# Patient Record
Sex: Male | Born: 1945 | Race: White | Hispanic: No | Smoking: Former smoker
Health system: Southern US, Community
[De-identification: ages and names within clinical notes are randomized; demographics above are authoritative.]

## PROBLEM LIST (undated history)

## (undated) DIAGNOSIS — H9192 Unspecified hearing loss, left ear: Secondary | ICD-10-CM

## (undated) DIAGNOSIS — I10 Essential (primary) hypertension: Secondary | ICD-10-CM

## (undated) DIAGNOSIS — I4891 Unspecified atrial fibrillation: Secondary | ICD-10-CM

## (undated) HISTORY — DX: Essential (primary) hypertension: I10

## (undated) HISTORY — DX: Unspecified atrial fibrillation: I48.91

## (undated) HISTORY — DX: Unspecified hearing loss, left ear: H91.92

---
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
## 1973-08-01 HISTORY — PX: BACK SURGERY: SHX140

## 1983-08-02 HISTORY — PX: BACK SURGERY: SHX140

## 2020-09-06 DIAGNOSIS — I25810 Atherosclerosis of coronary artery bypass graft(s) without angina pectoris: Secondary | ICD-10-CM | POA: Diagnosis present

## 2020-09-06 DIAGNOSIS — I5032 Chronic diastolic (congestive) heart failure: Secondary | ICD-10-CM | POA: Diagnosis present

## 2020-09-06 DIAGNOSIS — I25118 Atherosclerotic heart disease of native coronary artery with other forms of angina pectoris: Secondary | ICD-10-CM

## 2020-09-06 DIAGNOSIS — I2510 Atherosclerotic heart disease of native coronary artery without angina pectoris: Secondary | ICD-10-CM

## 2020-09-06 DIAGNOSIS — F418 Other specified anxiety disorders: Secondary | ICD-10-CM | POA: Diagnosis present

## 2020-09-06 DIAGNOSIS — E782 Mixed hyperlipidemia: Secondary | ICD-10-CM | POA: Insufficient documentation

## 2020-09-15 DIAGNOSIS — G4733 Obstructive sleep apnea (adult) (pediatric): Secondary | ICD-10-CM | POA: Insufficient documentation

## 2021-04-01 DIAGNOSIS — R001 Bradycardia, unspecified: Secondary | ICD-10-CM

## 2021-04-01 HISTORY — DX: Bradycardia, unspecified: R00.1

## 2021-04-12 DIAGNOSIS — I480 Paroxysmal atrial fibrillation: Secondary | ICD-10-CM | POA: Insufficient documentation

## 2021-04-12 DIAGNOSIS — I4891 Unspecified atrial fibrillation: Secondary | ICD-10-CM | POA: Insufficient documentation

## 2021-10-20 ENCOUNTER — Other Ambulatory Visit: Payer: Medicare Other

## 2021-10-20 ENCOUNTER — Other Ambulatory Visit: Payer: Self-pay

## 2021-10-20 ENCOUNTER — Ambulatory Visit (INDEPENDENT_AMBULATORY_CARE_PROVIDER_SITE_OTHER): Payer: Medicare Other | Admitting: Family

## 2021-10-20 ENCOUNTER — Encounter: Payer: Self-pay | Admitting: Family

## 2021-10-20 VITALS — BP 130/88 | HR 65 | Temp 97.5°F | Resp 18 | Ht 66.5 in | Wt 211.0 lb

## 2021-10-20 DIAGNOSIS — Z23 Encounter for immunization: Secondary | ICD-10-CM

## 2021-10-20 DIAGNOSIS — L602 Onychogryphosis: Secondary | ICD-10-CM

## 2021-10-20 DIAGNOSIS — I480 Paroxysmal atrial fibrillation: Secondary | ICD-10-CM

## 2021-10-20 DIAGNOSIS — I129 Hypertensive chronic kidney disease with stage 1 through stage 4 chronic kidney disease, or unspecified chronic kidney disease: Secondary | ICD-10-CM | POA: Insufficient documentation

## 2021-10-20 DIAGNOSIS — Z1211 Encounter for screening for malignant neoplasm of colon: Secondary | ICD-10-CM

## 2021-10-20 DIAGNOSIS — E785 Hyperlipidemia, unspecified: Secondary | ICD-10-CM | POA: Diagnosis not present

## 2021-10-20 DIAGNOSIS — G4733 Obstructive sleep apnea (adult) (pediatric): Secondary | ICD-10-CM

## 2021-10-20 DIAGNOSIS — N183 Chronic kidney disease, stage 3 unspecified: Secondary | ICD-10-CM

## 2021-10-20 DIAGNOSIS — N1832 Chronic kidney disease, stage 3b: Secondary | ICD-10-CM | POA: Insufficient documentation

## 2021-10-20 DIAGNOSIS — Z1159 Encounter for screening for other viral diseases: Secondary | ICD-10-CM

## 2021-10-20 DIAGNOSIS — Z7689 Persons encountering health services in other specified circumstances: Secondary | ICD-10-CM

## 2021-10-20 DIAGNOSIS — E1142 Type 2 diabetes mellitus with diabetic polyneuropathy: Secondary | ICD-10-CM | POA: Diagnosis not present

## 2021-10-20 MED ORDER — TETANUS-DIPHTH-ACELL PERTUSSIS 5-2.5-18.5 LF-MCG/0.5 IM SUSP: 0.5000 mL | Freq: Once | INTRAMUSCULAR | 0 refills | Status: AC

## 2021-10-20 NOTE — Progress Notes (Signed)
? ?Provider: Richarda Bladeinah Dominiqua Cooner FNP-C  ? ?No primary care provider on file. ? ?No care team member to display ? ?Extended Emergency Contact Information ?Primary Emergency Contact: Posceo,Deborah ?Address: [address] ?         [place] [id number] [place] ?Home Phone: [phone] ?Mobile Phone: [phone] ?Relation: Sister ?Preferred language: English ?Secondary Emergency Contact: Upham,Pattie ?Mobile Phone: [phone] ?Relation: Other ? ?Code Status:  Full code  ?Goals of care: Advanced Directive information ? ?  10/20/2021  ? 10:28 AM  ?Advanced Directives  ?Does Patient Have a Medical Advance Directive? Yes  ?Type of Estate agentAdvance Directive Healthcare Power of HeckerAttorney;Living will;Out of facility DNR (pink MOST or yellow form)  ?Does patient want to make changes to medical advance directive? No - Patient declined  ?Copy of Healthcare Power of Attorney in Chart? No - copy requested  ? ? ? ?Chief Complaint  ?Patient presents with  ? Establish Care  ?  New Patient.   ? ? ?HPI:  ?Pt is a 76 y.o. male seen today establish care here at [facility] for medical management of chronic diseases.  Has a medical history of CAD status post CABG/PCI, A-fib, non- rheumatic aortic valve stenosis, chronic diastolic congestive heart failure, type 2 diabetes with neuropathy, hypertension ,hyperlipidemia,GERD, obstructive sleep apnea, anxiety with depression seasonal,allergies ? ?Aortic valve stenosis -moderate by more most recent echo.  Was following up with valve team: ? ?Diastolic CHF- symptoms controlled on Furosemide 40 mg tablet daily   ? ?Hypertension - sometimes blood pressure runs high and at time low.once in a while feels dizzy.denies any headache,vision changes,fatigue,chest tightness,palpitation,chest pain or shortness of breath.    ? ? 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 ? ?CKD 3 a -latest  creatinine 1.52  ? ?Type 2 diabetes with neuropathy -His home CBG ranges in the 80's -120's   Latest A1c was 5.9 ( 09/27/2021).  Currently off medication.  States metformin was discontinued.  He is due for annual foot exam with podiatrist.  He does report numbness and tingling on peripheral extremities.  Also due for urine microalbumin. ? ?Hyperlipidemia -total cholesterol 264, triglycerides 318, and LDL 142 not on any statin. ? ?CAD status post CABG and PCI -follows up with cardiology.  Denies chest pains.Has Nitro PRN  ? ?GERD - on Protonix.symptoms controlled.No blood or dark stool  ? ?Anxiety and depression -on Effexor mood controlled.No thoughts of suicide or Injury to self  ? ?Seasonal allergies - on Flonase .Not controlled.takes OTC cough drops. ? 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 ? ?Hx of rib fracture due to fall down the steps after the dog took off when he was holding the lease.  ? ?Health maintenance:  ?Due for tetanus and zoster vaccine.  States zoster vaccine level is too expensive so he did not get it. ? ?He is due for screening colonoscopy.  He reports no symptoms.  He prefers to do Cologuard. ? ? ? ?Past Medical History:  ?Diagnosis Date  ? Atrial fibrillation (HCC)   ? Deafness in left ear   ? Hypertension   ? ?Past Surgical History:  ?Procedure Laterality Date  ? BACK SURGERY  1975  ? Lower Back Surgery, Dr.Seltzer  ? BACK SURGERY  1985  ? Dr.Seltzer  ? SHOULDER SURGERY Left 1965  ? Dr.Seltzer  ? TONSILLECTOMY  1950  ? Dr.Perellie  ? ? ?  No Known Allergies ? ?Allergies as of 10/20/2021   ?No Known Allergies ?  ? ?  ?Medication List  ?  ? ?  ? Accurate as of October 20, 2021  9:05 PM. If you have any questions, ask your nurse or doctor.  ?  ?  ? ?  ? ?amiodarone 200 MG tablet ?Commonly known as: PACERONE ?Take 200 mg by mouth daily. ?  ?aspirin EC 81 MG tablet ?Take 81 mg by mouth daily. Swallow whole. ?  ?fluticasone 50 MCG/ACT nasal spray ?Commonly known as: FLONASE ?Place 2 sprays  into both nostrils 3 (three) times daily as needed for allergies or rhinitis. ?  ?furosemide 40 MG tablet ?Commonly known as: LASIX ?Take 40 mg by mouth daily at 12 noon. ?  ?montelukast 10 MG tablet ?Commonly known as: SINGULAIR ?Take 10 mg by mouth in the morning. ?  ?nitroGLYCERIN 0.4 MG SL tablet ?Commonly known as: NITROSTAT ?Place 0.4 mg under the tongue every 5 (five) minutes as needed for chest pain. ?  ?pantoprazole 40 MG tablet ?Commonly known as: PROTONIX ?Take 40 mg by mouth daily. ?  ?propranolol 10 MG tablet ?Commonly known as: INDERAL ?Take 10 mg by mouth in the morning. ?  ?Symbicort 160-4.5 MCG/ACT inhaler ?Generic drug: budesonide-formoterol ?Inhale 2 puffs into the lungs 2 (two) times daily. Morning & Bedtime. ?  ?Tdap 5-2.5-18.5 LF-MCG/0.5 injection ?Commonly known as: BOOSTRIX ?Inject 0.5 mLs into the muscle once for 1 dose. ?  ?venlafaxine XR 150 MG 24 hr capsule ?Commonly known as: EFFEXOR-XR ?Take 150 mg by mouth daily with breakfast. ?  ? ?  ? ? ?Review of Systems  ?Constitutional:  Negative for appetite change, chills, fatigue, fever and unexpected weight change.  ?HENT:  Positive for hearing loss and rhinorrhea. Negative for congestion, dental problem, ear discharge, ear pain, facial swelling, nosebleeds, postnasal drip, sinus pressure, sinus pain, sneezing, sore throat, tinnitus and trouble swallowing.   ?     Left ear deaf  ?Eyes:  Positive for visual disturbance. Negative for pain, discharge, redness and itching.  ?     Wears eye glasses  ?Respiratory:  Negative for cough, chest tightness and wheezing.   ?     Dyspnea with exertion  ?OSA   ?Cardiovascular:  Positive for palpitations. Negative for chest pain and leg swelling.  ?Gastrointestinal:  Negative for abdominal distention, abdominal pain, blood in stool, constipation, diarrhea, nausea and vomiting.  ?Endocrine: Negative for cold intolerance, heat intolerance, polydipsia, polyphagia and polyuria.  ?Genitourinary:  Negative for  difficulty urinating, dysuria, flank pain, frequency and urgency.  ?Musculoskeletal:  Negative for arthralgias, back pain, gait problem, joint swelling, myalgias, neck pain and neck stiffness.  ?Skin:  Negative for color change, pallor, rash and wound.  ?Neurological:  Positive for numbness. Negative for dizziness, syncope, speech difficulty, weakness, light-headedness and headaches.  ?     On fingers ,feet   ?Hematological:  Does not bruise/bleed easily.  ?Psychiatric/Behavioral:  Negative for agitation, behavioral problems, confusion, hallucinations, self-injury, sleep disturbance and suicidal ideas. The patient is not nervous/anxious.   ?     Depression and anxiety controlled   ? ?Immunization History  ?Administered Date(s) Administered  ? Fluad Quad(high Dose 65+) 05/06/2020  ? Influenza Split 09/15/2009, 05/03/2013  ? Influenza, High Dose Seasonal PF 06/05/2014, 04/20/2015, 06/08/2016, 04/22/2017, 04/15/2021  ? Influenza-Unspecified 07/14/2004, 05/07/2007, 05/02/2008, 05/01/2018, 04/29/2019  ? PPD Test 06/08/2021  ? Pneumococcal Conjugate-13 06/05/2014  ? Pneumococcal Polysaccharide-23 08/13/2012  ? ?Pertinent  Health Maintenance Due  ?  Topic Date Due  ? URINE MICROALBUMIN  Never done  ? COLONOSCOPY (Pts 45-70yrs Insurance coverage will need to be confirmed)  Never done  ? INFLUENZA VACCINE  Completed  ? ? ?  10/20/2021  ? 10:28 AM  ?Fall Risk  ?Falls in the past year? 0  ?Was there an injury with Fall? 0  ?Fall Risk Category Calculator 0  ?Fall Risk Category Low  ?Patient Fall Risk Level Low fall risk  ?Patient at Risk for Falls Due to No Fall Risks  ?Fall risk Follow up Falls evaluation completed  ? ?Functional Status Survey: ?  ? ?Vitals:  ? 10/20/21 1018  ?BP: 130/88  ?Pulse: 65  ?Resp: 18  ?Temp: (!) 97.5 ?F (36.4 ?C)  ?SpO2: 92%  ?Weight: 211 lb (95.7 kg)  ?Height: 5' 6.5" (1.689 m)  ? ?Body mass index is 33.55 kg/m?Marland Kitchen ?Physical Exam ?Vitals reviewed.  ?Constitutional:   ?   General: He is not in acute  distress. ?   Appearance: Normal appearance. He is obese. He is not ill-appearing or diaphoretic.  ?HENT:  ?   Head: Normocephalic.  ?   Comments: HOH left ear  ?   Right Ear: Tympanic membrane, ear canal and ext

## 2021-10-29 LAB — COLOGUARD

## 2021-11-09 ENCOUNTER — Encounter: Payer: Self-pay | Admitting: Family

## 2021-11-09 ENCOUNTER — Ambulatory Visit (INDEPENDENT_AMBULATORY_CARE_PROVIDER_SITE_OTHER): Payer: Medicare Other | Admitting: Family

## 2021-11-09 VITALS — BP 138/86 | HR 93 | Temp 97.6°F | Resp 18 | Ht 66.5 in | Wt 207.8 lb

## 2021-11-09 DIAGNOSIS — R21 Rash and other nonspecific skin eruption: Secondary | ICD-10-CM | POA: Diagnosis not present

## 2021-11-09 DIAGNOSIS — M62830 Muscle spasm of back: Secondary | ICD-10-CM

## 2021-11-09 MED ORDER — METHOCARBAMOL 500 MG PO TABS: 500.0000 mg | ORAL_TABLET | Freq: Three times a day (TID) | ORAL | 0 refills | Status: DC | PRN

## 2021-11-09 MED ORDER — TRIAMCINOLONE ACETONIDE 0.1 % EX CREA: 1.0000 "application " | TOPICAL_CREAM | Freq: Two times a day (BID) | CUTANEOUS | 0 refills | Status: AC

## 2021-11-09 NOTE — Progress Notes (Signed)
? ?Provider: Richarda Blade FNP-C ? ?Telissa Palmisano, Donalee Citrin, NP ? ?Patient Care Team: ?Chanya Chrisley, Donalee Citrin, NP as PCP - General (Family Medicine) ? ?Extended Emergency Contact Information ?Primary Emergency Contact: Posceo,Deborah ?Address: [address] ?         [place], [place] [address] [place] ?Home Phone: [phone] ?Mobile Phone: [phone] ?Relation: Sister ?Preferred language: English ?Secondary Emergency Contact: Upham,Pattie ?Mobile Phone: [phone] ?Relation: Other ? ?Code Status: Full code ?Goals of care: Advanced Directive information ? ?  11/09/2021  ?  2:46 PM  ?Advanced Directives  ?Does Patient Have a Medical Advance Directive? Yes  ?Type of Estate agent of [place];Living will;Out of facility DNR (pink MOST or yellow form)  ?Does patient want to make changes to medical advance directive? No - Patient declined  ?Copy of Healthcare Power of Attorney in Chart? No - copy requested  ? ? ? ?Chief Complaint  ?Patient presents with  ? Acute Visit  ?  Patient is requesting medication for muscle spasms X 4 days.   ? ? ?HPI:  ?Pt is a 76 y.o. male seen today for an acute visit for evaluation of muscle spasms x 4 days spasms located on the mid back describes as " Freezes " unable to turn certain way. No weakness,tingling or numbness,loss of bowel or bladder.  ?Also complains of rash at the back of the neck.unclear what caused the rash.? Possible bite.  ? ?Past Medical History:  ?Diagnosis Date  ? Atrial fibrillation (HCC)   ? Deafness in left ear   ? Hypertension   ? ?Past Surgical History:  ?Procedure Laterality Date  ? BACK SURGERY  1975  ? Lower Back Surgery, Dr.Seltzer  ? BACK SURGERY  1985  ? Dr.Seltzer  ? SHOULDER SURGERY Left 1965  ? Dr.Seltzer  ? TONSILLECTOMY  1950  ? Dr.Perellie  ? ? ?No Known Allergies ? ?Outpatient Encounter Medications as of 11/09/2021  ?Medication Sig  ? amiodarone (PACERONE) 200 MG tablet Take 200 mg by mouth daily.  ? aspirin EC 81 MG tablet  Take 81 mg by mouth daily. Swallow whole.  ? budesonide-formoterol (SYMBICORT) 160-4.5 MCG/ACT inhaler Inhale 2 puffs into the lungs 2 (two) times daily. Morning & Bedtime.  ? fluticasone (FLONASE) 50 MCG/ACT nasal spray Place 2 sprays into both nostrils 3 (three) times daily as needed for allergies or rhinitis.  ? furosemide (LASIX) 40 MG tablet Take 40 mg by mouth daily at 12 noon.  ? lisinopril (ZESTRIL) 2.5 MG tablet Take 2.5 mg by mouth daily.  ? montelukast (SINGULAIR) 10 MG tablet Take 10 mg by mouth in the morning.  ? nitroGLYCERIN (NITROSTAT) 0.4 MG SL tablet Place 0.4 mg under the tongue every 5 (five) minutes as needed for chest pain.  ? pantoprazole (PROTONIX) 40 MG tablet Take 40 mg by mouth daily.  ? propranolol (INDERAL) 10 MG tablet Take 10 mg by mouth in the morning.  ? rosuvastatin (CRESTOR) 40 MG tablet Take 40 mg by mouth daily.  ? venlafaxine XR (EFFEXOR-XR) 150 MG 24 hr capsule Take 150 mg by mouth daily with breakfast.  ? ?No facility-administered encounter medications on file as of 11/09/2021.  ? ? ?Review of Systems  ?Constitutional:  Negative for appetite change, chills, fatigue, fever and unexpected weight change.  ?Eyes:  Negative for pain, discharge, redness, itching and visual disturbance.  ?Respiratory:  Negative for cough, chest tightness, shortness of breath and wheezing.   ?Cardiovascular:  Negative for chest pain, palpitations and leg swelling.  ?  Gastrointestinal:  Negative for abdominal distention, abdominal pain, constipation, nausea and vomiting.  ?Genitourinary:  Negative for difficulty urinating, dysuria, flank pain, frequency and urgency.  ?Musculoskeletal:  Negative for arthralgias, back pain, gait problem, joint swelling, myalgias, neck pain and neck stiffness.  ?Skin:  Positive for rash. Negative for color change, pallor and wound.  ?     Rash back of the neck   ?Neurological:  Negative for dizziness, syncope, speech difficulty, weakness, light-headedness, numbness and  headaches.  ? ?Immunization History  ?Administered Date(s) Administered  ? Fluad Quad(high Dose 65+) 05/06/2020  ? Influenza Split 09/15/2009, 05/03/2013  ? Influenza, High Dose Seasonal PF 06/05/2014, 04/20/2015, 06/08/2016, 04/22/2017, 04/15/2021  ? Influenza-Unspecified 07/14/2004, 05/07/2007, 05/02/2008, 05/01/2018, 04/29/2019  ? PPD Test 06/08/2021  ? Pneumococcal Conjugate-13 06/05/2014  ? Pneumococcal Polysaccharide-23 08/13/2012  ? ?Pertinent  Health Maintenance Due  ?Topic Date Due  ? HEMOGLOBIN A1C  Never done  ? FOOT EXAM  Never done  ? OPHTHALMOLOGY EXAM  Never done  ? URINE MICROALBUMIN  Never done  ? COLONOSCOPY (Pts 45-7969yrs Insurance coverage will need to be confirmed)  Never done  ? INFLUENZA VACCINE  03/01/2022  ? ? ?  10/20/2021  ? 10:28 AM 11/09/2021  ?  2:46 PM  ?Fall Risk  ?Falls in the past year? 0 0  ?Was there an injury with Fall? 0 0  ?Fall Risk Category Calculator 0 0  ?Fall Risk Category Low Low  ?Patient Fall Risk Level Low fall risk Low fall risk  ?Patient at Risk for Falls Due to No Fall Risks No Fall Risks  ?Fall risk Follow up Falls evaluation completed Falls evaluation completed  ? ?Functional Status Survey: ?  ? ?Vitals:  ? 11/09/21 1455  ?BP: 138/86  ?Pulse: 93  ?Resp: 18  ?Temp: 97.6 ?F (36.4 ?C)  ?SpO2: 93%  ?Weight: 207 lb 12.8 oz (94.3 kg)  ?Height: 5' 6.5" (1.689 m)  ? ?Body mass index is 33.04 kg/m?Marland Kitchen. ?Physical Exam ?Vitals reviewed.  ?Constitutional:   ?   General: He is not in acute distress. ?   Appearance: Normal appearance. He is normal weight. He is not ill-appearing or diaphoretic.  ?HENT:  ?   Head: Normocephalic.  ?   Nose: Nose normal. No congestion or rhinorrhea.  ?   Mouth/Throat:  ?   Mouth: Mucous membranes are moist.  ?   Pharynx: Oropharynx is clear. No oropharyngeal exudate or posterior oropharyngeal erythema.  ?Eyes:  ?   General: No scleral icterus.    ?   Right eye: No discharge.     ?   Left eye: No discharge.  ?   Extraocular Movements: Extraocular  movements intact.  ?   Conjunctiva/sclera: Conjunctivae normal.  ?   Pupils: Pupils are equal, round, and reactive to light.  ?Neck:  ?   Vascular: No carotid bruit.  ?Cardiovascular:  ?   Rate and Rhythm: Normal rate and regular rhythm.  ?   Pulses: Normal pulses.  ?   Heart sounds: Normal heart sounds. No murmur heard. ?  No friction rub. No gallop.  ?Pulmonary:  ?   Effort: Pulmonary effort is normal. No respiratory distress.  ?   Breath sounds: Normal breath sounds. No wheezing, rhonchi or rales.  ?Chest:  ?   Chest wall: No tenderness.  ?Abdominal:  ?   General: Bowel sounds are normal. There is no distension.  ?   Palpations: Abdomen is soft. There is no mass.  ?   Tenderness:  There is no abdominal tenderness. There is no right CVA tenderness, left CVA tenderness, guarding or rebound.  ?Musculoskeletal:     ?   General: No swelling or tenderness. Normal range of motion.  ?   Cervical back: Normal range of motion. No rigidity or tenderness.  ?   Right lower leg: No edema.  ?   Left lower leg: No edema.  ?   Comments: Limited range of motion rotating from left to right patient guarding due to spasm.  ?Lymphadenopathy:  ?   Cervical: No cervical adenopathy.  ?Skin: ?   General: Skin is warm and dry.  ?   Coloration: Skin is not pale.  ?   Findings: Rash present. No bruising, erythema or lesion.  ?   Comments: Rash on back of the neck red difficult to discern due to scratching  ?Neurological:  ?   Mental Status: He is alert and oriented to person, place, and time.  ?   Motor: No weakness.  ?   Gait: Gait normal.  ?Psychiatric:     ?   Mood and Affect: Mood normal.     ?   Speech: Speech normal.     ?   Behavior: Behavior normal.  ? ? ?Labs reviewed: ?No results for input(s): NA, K, CL, CO2, GLUCOSE, BUN, CREATININE, CALCIUM, MG, PHOS in the last 8760 hours. ?No results for input(s): AST, ALT, ALKPHOS, BILITOT, PROT, ALBUMIN in the last 8760 hours. ?No results for input(s): WBC, NEUTROABS, HGB, HCT, MCV, PLT in the  last 8760 hours. ?No results found for: TSH ?No results found for: HGBA1C ?No results found for: CHOL, HDL, LDLCALC, LDLDIRECT, TRIG, CHOLHDL ? ?Significant Diagnostic Results in last 30 days:  ?No results found.

## 2021-12-14 ENCOUNTER — Observation Stay (HOSPITAL_COMMUNITY)
Admission: EM | Admit: 2021-12-14 | Discharge: 2021-12-17 | Disposition: A | Discharge status: Home / Self Care | Payer: Medicare Other | Attending: Internal Medicine | Admitting: Internal Medicine

## 2021-12-14 ENCOUNTER — Encounter (HOSPITAL_COMMUNITY): Payer: Self-pay

## 2021-12-14 ENCOUNTER — Ambulatory Visit (INDEPENDENT_AMBULATORY_CARE_PROVIDER_SITE_OTHER): Payer: Medicare Other | Admitting: Family

## 2021-12-14 ENCOUNTER — Encounter: Payer: Self-pay | Admitting: Family

## 2021-12-14 ENCOUNTER — Other Ambulatory Visit: Payer: Self-pay

## 2021-12-14 ENCOUNTER — Emergency Department (HOSPITAL_COMMUNITY): Payer: Medicare Other

## 2021-12-14 VITALS — HR 71 | Temp 98.0°F | Resp 18 | Ht 66.5 in | Wt 211.2 lb

## 2021-12-14 DIAGNOSIS — I25118 Atherosclerotic heart disease of native coronary artery with other forms of angina pectoris: Secondary | ICD-10-CM

## 2021-12-14 DIAGNOSIS — I4891 Unspecified atrial fibrillation: Secondary | ICD-10-CM

## 2021-12-14 DIAGNOSIS — I10 Essential (primary) hypertension: Secondary | ICD-10-CM

## 2021-12-14 DIAGNOSIS — E1169 Type 2 diabetes mellitus with other specified complication: Secondary | ICD-10-CM | POA: Diagnosis not present

## 2021-12-14 DIAGNOSIS — R001 Bradycardia, unspecified: Secondary | ICD-10-CM

## 2021-12-14 DIAGNOSIS — J9611 Chronic respiratory failure with hypoxia: Secondary | ICD-10-CM | POA: Diagnosis not present

## 2021-12-14 DIAGNOSIS — I2510 Atherosclerotic heart disease of native coronary artery without angina pectoris: Secondary | ICD-10-CM

## 2021-12-14 DIAGNOSIS — Z87891 Personal history of nicotine dependence: Secondary | ICD-10-CM | POA: Insufficient documentation

## 2021-12-14 DIAGNOSIS — Z950 Presence of cardiac pacemaker: Secondary | ICD-10-CM

## 2021-12-14 DIAGNOSIS — Z7982 Long term (current) use of aspirin: Secondary | ICD-10-CM | POA: Insufficient documentation

## 2021-12-14 DIAGNOSIS — I130 Hypertensive heart and chronic kidney disease with heart failure and stage 1 through stage 4 chronic kidney disease, or unspecified chronic kidney disease: Secondary | ICD-10-CM | POA: Insufficient documentation

## 2021-12-14 DIAGNOSIS — Z955 Presence of coronary angioplasty implant and graft: Secondary | ICD-10-CM | POA: Insufficient documentation

## 2021-12-14 DIAGNOSIS — R110 Nausea: Secondary | ICD-10-CM

## 2021-12-14 DIAGNOSIS — R400 Somnolence: Secondary | ICD-10-CM

## 2021-12-14 DIAGNOSIS — I350 Nonrheumatic aortic (valve) stenosis: Secondary | ICD-10-CM | POA: Diagnosis not present

## 2021-12-14 DIAGNOSIS — N179 Acute kidney failure, unspecified: Secondary | ICD-10-CM

## 2021-12-14 DIAGNOSIS — E1122 Type 2 diabetes mellitus with diabetic chronic kidney disease: Secondary | ICD-10-CM | POA: Diagnosis not present

## 2021-12-14 DIAGNOSIS — Z951 Presence of aortocoronary bypass graft: Secondary | ICD-10-CM | POA: Insufficient documentation

## 2021-12-14 DIAGNOSIS — E1140 Type 2 diabetes mellitus with diabetic neuropathy, unspecified: Secondary | ICD-10-CM | POA: Insufficient documentation

## 2021-12-14 DIAGNOSIS — E782 Mixed hyperlipidemia: Secondary | ICD-10-CM | POA: Diagnosis present

## 2021-12-14 DIAGNOSIS — E1142 Type 2 diabetes mellitus with diabetic polyneuropathy: Secondary | ICD-10-CM | POA: Diagnosis not present

## 2021-12-14 DIAGNOSIS — Z79899 Other long term (current) drug therapy: Secondary | ICD-10-CM | POA: Insufficient documentation

## 2021-12-14 DIAGNOSIS — N1832 Chronic kidney disease, stage 3b: Secondary | ICD-10-CM | POA: Diagnosis not present

## 2021-12-14 DIAGNOSIS — R778 Other specified abnormalities of plasma proteins: Secondary | ICD-10-CM | POA: Diagnosis not present

## 2021-12-14 DIAGNOSIS — I480 Paroxysmal atrial fibrillation: Secondary | ICD-10-CM | POA: Diagnosis present

## 2021-12-14 DIAGNOSIS — I739 Peripheral vascular disease, unspecified: Secondary | ICD-10-CM

## 2021-12-14 DIAGNOSIS — R079 Chest pain, unspecified: Secondary | ICD-10-CM | POA: Diagnosis not present

## 2021-12-14 DIAGNOSIS — I5032 Chronic diastolic (congestive) heart failure: Secondary | ICD-10-CM | POA: Diagnosis present

## 2021-12-14 DIAGNOSIS — G4733 Obstructive sleep apnea (adult) (pediatric): Secondary | ICD-10-CM | POA: Diagnosis present

## 2021-12-14 DIAGNOSIS — I25810 Atherosclerosis of coronary artery bypass graft(s) without angina pectoris: Secondary | ICD-10-CM | POA: Diagnosis present

## 2021-12-14 DIAGNOSIS — F418 Other specified anxiety disorders: Secondary | ICD-10-CM | POA: Diagnosis present

## 2021-12-14 DIAGNOSIS — G4719 Other hypersomnia: Secondary | ICD-10-CM

## 2021-12-14 LAB — CBG MONITORING, ED: Glucose-Capillary: 153 mg/dL — ABNORMAL HIGH (ref 70–99)

## 2021-12-14 LAB — COMPREHENSIVE METABOLIC PANEL
ALT: 17 U/L (ref 0–44)
AST: 19 U/L (ref 15–41)
Albumin: 4.3 g/dL (ref 3.5–5.0)
Alkaline Phosphatase: 93 U/L (ref 38–126)
Anion gap: 8 (ref 5–15)
BUN: 22 mg/dL (ref 8–23)
CO2: 29 mmol/L (ref 22–32)
Calcium: 10.8 mg/dL — ABNORMAL HIGH (ref 8.9–10.3)
Chloride: 101 mmol/L (ref 98–111)
Creatinine, Ser: 1.76 mg/dL — ABNORMAL HIGH (ref 0.61–1.24)
GFR, Estimated: 40 mL/min — ABNORMAL LOW (ref >60)
Glucose, Bld: 108 mg/dL — ABNORMAL HIGH (ref 70–99)
Potassium: 3.9 mmol/L (ref 3.5–5.1)
Sodium: 138 mmol/L (ref 135–145)
Total Bilirubin: 1.1 mg/dL (ref 0.3–1.2)
Total Protein: 7.9 g/dL (ref 6.5–8.1)

## 2021-12-14 LAB — URINALYSIS, ROUTINE W REFLEX MICROSCOPIC
Bilirubin Urine: NEGATIVE
Glucose, UA: NEGATIVE mg/dL
Hgb urine dipstick: NEGATIVE
Ketones, ur: NEGATIVE mg/dL
Leukocytes,Ua: NEGATIVE
Nitrite: NEGATIVE
Protein, ur: NEGATIVE mg/dL
Specific Gravity, Urine: 1.019 (ref 1.005–1.030)
pH: 5 (ref 5.0–8.0)

## 2021-12-14 LAB — CBC
HCT: 54.1 % — ABNORMAL HIGH (ref 39.0–52.0)
Hemoglobin: 17.2 g/dL — ABNORMAL HIGH (ref 13.0–17.0)
MCH: 26.7 pg (ref 26.0–34.0)
MCHC: 31.8 g/dL (ref 30.0–36.0)
MCV: 84.1 fL (ref 80.0–100.0)
Platelets: 215 10*3/uL (ref 150–400)
RBC: 6.43 MIL/uL — ABNORMAL HIGH (ref 4.22–5.81)
RDW: 19.1 % — ABNORMAL HIGH (ref 11.5–15.5)
WBC: 8.8 10*3/uL (ref 4.0–10.5)
nRBC: 0 % (ref 0.0–0.2)

## 2021-12-14 LAB — VITAMIN B12: Vitamin B-12: 204 pg/mL (ref 180–914)

## 2021-12-14 LAB — TSH: TSH: 2.122 u[IU]/mL (ref 0.350–4.500)

## 2021-12-14 LAB — GLUCOSE, POCT (MANUAL RESULT ENTRY): POC Glucose: 117 mg/dl — AB (ref 70–99)

## 2021-12-14 LAB — BRAIN NATRIURETIC PEPTIDE: B Natriuretic Peptide: 43.2 pg/mL (ref 0.0–100.0)

## 2021-12-14 LAB — TROPONIN I (HIGH SENSITIVITY)
Troponin I (High Sensitivity): 33 ng/L — ABNORMAL HIGH (ref <18)
Troponin I (High Sensitivity): 19 ng/L — ABNORMAL HIGH (ref <18)

## 2021-12-14 LAB — LIPASE, BLOOD: Lipase: 28 U/L (ref 11–51)

## 2021-12-14 MED ORDER — ACETAMINOPHEN 650 MG RE SUPP: 650.0000 mg | Freq: Four times a day (QID) | RECTAL | Status: DC | PRN

## 2021-12-14 MED ORDER — SODIUM CHLORIDE 0.9 % IV SOLN: INTRAVENOUS | Status: DC

## 2021-12-14 MED ORDER — VENLAFAXINE HCL ER 150 MG PO CP24
150.0000 mg | ORAL_CAPSULE | Freq: Every day | ORAL | Status: DC
  Administered 2021-12-15 – 2021-12-17 (×3): 150 mg via ORAL
  Filled 2021-12-14 (×3): qty 1

## 2021-12-14 MED ORDER — ENOXAPARIN SODIUM 40 MG/0.4ML IJ SOSY
40.0000 mg | PREFILLED_SYRINGE | INTRAMUSCULAR | Status: DC
  Administered 2021-12-14 – 2021-12-16 (×3): 40 mg via SUBCUTANEOUS
  Filled 2021-12-14 (×3): qty 0.4

## 2021-12-14 MED ORDER — AMIODARONE HCL 200 MG PO TABS
200.0000 mg | ORAL_TABLET | Freq: Every day | ORAL | Status: DC
  Administered 2021-12-14 – 2021-12-17 (×4): 200 mg via ORAL
  Filled 2021-12-14 (×4): qty 1

## 2021-12-14 MED ORDER — ACETAMINOPHEN 325 MG PO TABS: 650.0000 mg | ORAL_TABLET | Freq: Four times a day (QID) | ORAL | Status: DC | PRN

## 2021-12-14 MED ORDER — INSULIN ASPART 100 UNIT/ML IJ SOLN
0.0000 [IU] | Freq: Three times a day (TID) | INTRAMUSCULAR | Status: DC
  Administered 2021-12-15: 1 [IU] via SUBCUTANEOUS
  Administered 2021-12-16: 3 [IU] via SUBCUTANEOUS
  Administered 2021-12-17: 2 [IU] via SUBCUTANEOUS

## 2021-12-14 MED ORDER — PANTOPRAZOLE SODIUM 40 MG PO TBEC
40.0000 mg | DELAYED_RELEASE_TABLET | Freq: Every day | ORAL | Status: DC
  Administered 2021-12-15 – 2021-12-17 (×3): 40 mg via ORAL
  Filled 2021-12-14 (×3): qty 1

## 2021-12-14 MED ORDER — NITROGLYCERIN 0.4 MG SL SUBL
0.4000 mg | SUBLINGUAL_TABLET | SUBLINGUAL | Status: DC | PRN
  Administered 2021-12-16: 0.4 mg via SUBLINGUAL
  Filled 2021-12-14 (×2): qty 1

## 2021-12-14 MED ORDER — ASPIRIN EC 81 MG PO TBEC
81.0000 mg | DELAYED_RELEASE_TABLET | Freq: Every day | ORAL | Status: DC
  Administered 2021-12-15: 81 mg via ORAL
  Filled 2021-12-14: qty 1

## 2021-12-14 MED ORDER — ROSUVASTATIN CALCIUM 20 MG PO TABS
40.0000 mg | ORAL_TABLET | Freq: Every day | ORAL | Status: DC
  Administered 2021-12-14 – 2021-12-17 (×4): 40 mg via ORAL
  Filled 2021-12-14 (×4): qty 2

## 2021-12-14 NOTE — Assessment & Plan Note (Signed)
Stents in left leg ?Continue aSA/statin  ?

## 2021-12-14 NOTE — Progress Notes (Signed)
? ? ?Provider: Marlowe Sax FNP-C ? ?Damare Serano, Nelda Bucks, NP ? ?Patient Care Team: ?Kaveri Perras, Nelda Bucks, NP as PCP - General (Family Medicine) ? ?Extended Emergency Contact Information ?Primary Emergency Contact: Posceo,Deborah ?Address: [address] ?         [place] [address] [place] ?Home Phone: [phone] ?Mobile Phone: [phone] ?Relation: Sister ?Preferred language: English ?Secondary Emergency Contact: Upham,Pattie ?Mobile Phone: [phone] ?Relation: Other ? ?Code Status:  Full Code  ?Goals of care: Advanced Directive information ? ?  12/14/2021  ? 10:22 AM  ?Advanced Directives  ?Does Patient Have a Medical Advance Directive? Yes  ?Type of Paramedic of [place];Living will;Out of facility DNR (pink MOST or yellow form)  ?Does patient want to make changes to medical advance directive? No - Patient declined  ?Copy of [place] in Chart? No - copy requested  ? ? ? ?Chief Complaint  ?Patient presents with  ? Acute Visit  ?  Patient has sleep concerns and complains of blood sugar running low.   ? ? ?HPI:  ?Pt is a 76 y.o. male seen today for an acute visit for evaluation of increased sleepiness,low blood sugar and intermittent chest pain.chest pain associated with nausea but no vomiting.chest pain has been intermittent for about a month.  Chest pain does not radiate to jaw or arm.He denies any fever,chills,cough,fatigue,body aches,runny nose,chest tightness,shortness of breath  or palpitation. ? ?Also complains of being sleepy all the time. 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 ? ?Has a medical history of hypertension, hyperlipidemia, CAD status post CABG/PCI, arterial fibrillation, nonrheumatic aortic valve stenosis, chronic diastolic congestive heart failure, type 2 diabetes with peripheral neuropathy, obstructive sleep apnea, generalized anxiety with depression,  seasonal allergies, chronic kidney disease stage IIIa,GERD, deafness in left ear among other conditions. ?States he drove here by himself today he was able to speak with her sister on the phone. ?Glucose checked was 117 here today. ?Unable to obtain blood pressure despite multiple attempts by multiple staff members.  Provider attempted without any success.Discussed ED evaluation due to ongoing intermittent chest pain with nausea and inability to obtain blood pressure. ? ?He was here fasting today for blood work. ? ? ? ?Past Medical History:  ?Diagnosis Date  ? Atrial fibrillation ([place])   ? Deafness in left ear   ? Hypertension   ? ?Past Surgical History:  ?Procedure Laterality Date  ? [place]  ? Lower Back Surgery, Dr.Seltzer  ? [place]  ? Dr.Seltzer  ? SHOULDER SURGERY Left 1965  ? Dr.Seltzer  ? TONSILLECTOMY  1950  ? Dr.Perellie  ? ? ?No Known Allergies ? ?Outpatient Encounter Medications as of 12/14/2021  ?Medication Sig  ? amiodarone (PACERONE) 200 MG tablet Take 200 mg by mouth daily.  ? aspirin EC 81 MG tablet Take 81 mg by mouth daily. Swallow whole.  ? budesonide-formoterol (SYMBICORT) 160-4.5 MCG/ACT inhaler Inhale 2 puffs into the lungs 2 (two) times daily. Morning & Bedtime.  ? fluticasone (FLONASE) 50 MCG/ACT nasal spray Place 2 sprays into both nostrils 3 (three) times daily as needed for allergies or rhinitis.  ? furosemide (LASIX) 40 MG tablet Take 40 mg by mouth daily at 12 noon.  ? lisinopril (ZESTRIL) 2.5 MG tablet Take 2.5 mg by mouth daily.  ? methocarbamol (ROBAXIN) 500 MG tablet Take 1 tablet (500 mg total) by mouth every  8 (eight) hours as needed for muscle spasms.  ? montelukast (SINGULAIR) 10 MG tablet Take 10 mg by mouth in the morning.  ? nitroGLYCERIN (NITROSTAT) 0.4 MG SL tablet Place 0.4 mg under the tongue every 5 (five) minutes as needed for chest pain.  ? pantoprazole (PROTONIX) 40 MG tablet Take 40 mg by mouth daily.  ? propranolol (INDERAL) 10 MG tablet Take 10  mg by mouth in the morning.  ? rosuvastatin (CRESTOR) 40 MG tablet Take 40 mg by mouth daily.  ? venlafaxine XR (EFFEXOR-XR) 150 MG 24 hr capsule Take 150 mg by mouth daily with breakfast.  ? ?No facility-administered encounter medications on file as of 12/14/2021.  ? ? ?Review of Systems  ?Constitutional:  Negative for appetite change, chills, fatigue, fever and unexpected weight change.  ?HENT:  Positive for hearing loss. Negative for congestion, dental problem, ear discharge, ear pain, facial swelling, nosebleeds, postnasal drip, rhinorrhea, sinus pressure, sinus pain, sneezing, sore throat, tinnitus and trouble swallowing.   ?     Left ear deaf  ?Eyes:  Negative for pain, discharge, redness, itching and visual disturbance.  ?Respiratory:  Positive for shortness of breath. Negative for cough, chest tightness and wheezing.   ?Cardiovascular:  Positive for chest pain. Negative for palpitations and leg swelling.  ?Gastrointestinal:  Negative for abdominal distention, abdominal pain, blood in stool, constipation, diarrhea, nausea and vomiting.  ?Endocrine: Negative for cold intolerance, heat intolerance, polydipsia, polyphagia and polyuria.  ?Genitourinary:  Negative for difficulty urinating, dysuria, flank pain, frequency and urgency.  ?Musculoskeletal:  Negative for arthralgias, back pain, gait problem, joint swelling, myalgias, neck pain and neck stiffness.  ?Skin:  Negative for color change, pallor and rash.  ?Neurological:  Negative for dizziness, syncope, speech difficulty, weakness, light-headedness, numbness and headaches.  ?Hematological:  Does not bruise/bleed easily.  ?Psychiatric/Behavioral:  Negative for agitation, behavioral problems, confusion, hallucinations, self-injury, sleep disturbance and suicidal ideas. The patient is not nervous/anxious.   ? ?Immunization History  ?Administered Date(s) Administered  ? Fluad Quad(high Dose 65+) 05/06/2020  ? Influenza Split 09/15/2009, 05/03/2013  ? Influenza,  High Dose Seasonal PF 06/05/2014, 04/20/2015, 06/08/2016, 04/22/2017, 04/15/2021  ? Influenza-Unspecified 07/14/2004, 05/07/2007, 05/02/2008, 05/01/2018, 04/29/2019  ? PPD Test 06/08/2021  ? Pneumococcal Conjugate-13 06/05/2014  ? Pneumococcal Polysaccharide-23 08/13/2012  ? ?Pertinent  Health Maintenance Due  ?Topic Date Due  ? HEMOGLOBIN A1C  Never done  ? FOOT EXAM  Never done  ? OPHTHALMOLOGY EXAM  Never done  ? COLONOSCOPY (Pts 45-52yrs Insurance coverage will need to be confirmed)  Never done  ? INFLUENZA VACCINE  03/01/2022  ? ? ?  10/20/2021  ? 10:28 AM 11/09/2021  ?  2:46 PM 12/14/2021  ? 10:22 AM  ?Fall Risk  ?Falls in the past year? 0 0 0  ?Was there an injury with Fall? 0 0 0  ?Fall Risk Category Calculator 0 0 0  ?Fall Risk Category Low Low Low  ?Patient Fall Risk Level Low fall risk Low fall risk Low fall risk  ?Patient at Risk for Falls Due to No Fall Risks No Fall Risks No Fall Risks  ?Fall risk Follow up Falls evaluation completed Falls evaluation completed Falls evaluation completed  ? ?Functional Status Survey: ?  ? ?Vitals:  ? 12/14/21 1014  ?Pulse: 71  ?Resp: 18  ?Temp: 98 ?F (36.7 ?C)  ?SpO2: 90%  ?Weight: 211 lb 3.2 oz (95.8 kg)  ?Height: 5' 6.5" (1.689 m)  ? ?Body mass index is 33.58 kg/m?Marland Kitchen ?Physical Exam ?Vitals  reviewed.  ?Constitutional:   ?   General: He is not in acute distress. ?   Appearance: Normal appearance. He is obese. He is not ill-appearing or diaphoretic.  ?   Comments: Stable sitting on chair and making phone call to the sister   ?HENT:  ?   Head: Normocephalic.  ?   Right Ear: Tympanic membrane, ear canal and external ear normal. There is no impacted cerumen.  ?   Left Ear: Tympanic membrane, ear canal and external ear normal. There is no impacted cerumen.  ?   Nose: Nose normal. No congestion or rhinorrhea.  ?   Mouth/Throat:  ?   Mouth: Mucous membranes are moist.  ?   Pharynx: Oropharynx is clear. No oropharyngeal exudate or posterior oropharyngeal erythema.  ?Eyes:  ?    General: No scleral icterus.    ?   Right eye: No discharge.     ?   Left eye: No discharge.  ?   Extraocular Movements: Extraocular movements intact.  ?   Conjunctiva/sclera: Conjunctivae normal.  ?   Pupil
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
## 2021-12-14 NOTE — Assessment & Plan Note (Addendum)
Hold lasix with AKI Strict I/O and daily weights  -defer restarting lasix to cards

## 2021-12-14 NOTE — Assessment & Plan Note (Addendum)
-   baseline appears to be around 1.4-1.5.  ?Holding ACE/lasix ? ?
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
## 2021-12-14 NOTE — Assessment & Plan Note (Addendum)
In NSR, rate controlled Continue amiodarone and inderal He is unsure why anticoagulation was stopped Hopefully can get records from [facility]  (per Epic chart review there was an end date of 10/26 so I question if the med accidentally fell of his list)

## 2021-12-14 NOTE — Assessment & Plan Note (Signed)
Continue effexor  

## 2021-12-14 NOTE — Assessment & Plan Note (Addendum)
Well controlled ?Continue inderal, hold lasix/lisinopril with aki  ?

## 2021-12-14 NOTE — ED Notes (Signed)
Patient reminded of the need for a urine sample. Patient provided with a urinal at bedside and patient reports that he will try when he can.  ?

## 2021-12-14 NOTE — ED Notes (Signed)
Provider at bedside at this time

## 2021-12-14 NOTE — Assessment & Plan Note (Addendum)
06/2021 echo showed moderate AS LHC in 06/2021 showed non critical AS. Did this to start w/u for TAVR Aortic valve was easily crossed likely signaling less stenosis than initially believed -echo similar to prior with mod AS

## 2021-12-14 NOTE — Assessment & Plan Note (Addendum)
-  cardiology consulted -echo done and similar to prior. Last echo in 11/22: EF 60%, moderate AS -LHC in 06/2021 showed non obstructive disease  -had work up underway for TAVR eval, LHC in 06/2021 showed that aortic valve was easily crossed likely signaling less stenosis than initially believed -continue ASA/NG/statin  -will need to establish care with cardiology here since moved from [facility]  -stress test 5/18

## 2021-12-14 NOTE — ED Triage Notes (Signed)
Pt arrived via [facility] from PCP. Per EMS pt has c/c of increased sleepiness for 72-month, intermittent chest pain for 1 month. Pt went to PCP due to increased tiredness for the past month. Hx of bypass, open heart surgery. ?  ?60-64HR, 1201/90, 96% RA, 118 CBG  ?81 ASA ?
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
## 2021-12-14 NOTE — Patient Instructions (Signed)
Please go to ED for evaluation of chest pain,nausea and increased sleepiness  ?

## 2021-12-14 NOTE — Assessment & Plan Note (Addendum)
-   not having cpap for past 7 month could also be contributing factor -B12 lower end of normal- replete PO for now- follow up outpatient

## 2021-12-14 NOTE — Assessment & Plan Note (Signed)
S/p PPM 

## 2021-12-14 NOTE — Assessment & Plan Note (Addendum)
continue crestor

## 2021-12-14 NOTE — H&P (Signed)
?History and Physical  ? ? ?Patient: Johnny [name] [id number] DOB: [birth year] ?DOA: 12/14/2021 ?DOS: the patient was seen and examined on 12/14/2021 ?PCP: Ngetich, Nelda Bucks, NP  ?Patient coming from: SNF - lives with a roommate. Ambulates independently  ? ? ?Chief Complaint: chest pain and excessive daytime sleepiness  ? ?HPI: Johnny [name] is a 76 y.o. male with medical history significant of atrial fibrillation, HTN, CKD stage 3, T2DM, HLD, CAD s/p CABG/PCI, aortic valve stenosis, diastolic CHF, GERD, OSA, anxiety and depression who presented to his PCP office with complaints of chest pain and shortness of breath. He was in training for a job for the past 3 weeks and he kept following asleep. This past Friday they asked him to resign because of his excessive sleepiness. He made an appointment with his PCP and they couldn't find his blood pressure.  He also has been having chest pain intermittently x 3 weeks. Pain is over left anterior chest wall and radiates to his back/shoulder blades. Pain lasts for 30 minutes or more and if he takes a NG it helps relieve the pain. He has associated nausea, shortness of breath with the chest pain.  He states his shortness of breath is no worse than normal.  ? ? ?Denies any fever/chills, vision changes/headaches,  cough, abdominal pain, N/V/D, dysuria or leg swelling.  ? ? ?He does not smoke, drinks occasionally.  ? ?ER Course:  vitals: afebrile, bp: 105/83, HR: 63, RR: 12, oxygen: 100%RA ?Pertinent labs: hgb: 17.2, troponin 33>19, creatinine 1.76, calcium: 10.8,  ?CXR: no acute finding  ?In ED: cardiology consulted  ? ?Review of Systems: As mentioned in the history of present illness. All other systems reviewed and are negative. ?Past Medical History:  ?Diagnosis Date  ? Atrial fibrillation ([place])   ? Deafness in left ear   ? Hypertension   ? ?Past Surgical History:  ?Procedure Laterality Date  ? [place]  ? Lower Back Surgery, Dr.Seltzer  ? [place]  ?  Dr.Seltzer  ? SHOULDER SURGERY Left 1965  ? Dr.Seltzer  ? TONSILLECTOMY  1950  ? Dr.Perellie  ? ?Social History:  reports that he has quit smoking. His smoking use included cigarettes. He has a 10.00 pack-year smoking history. He has never used smokeless tobacco. He reports that he does not drink alcohol and does not use drugs. ? ?Allergies  ?Allergen Reactions  ? Metformin And Related Other (See Comments)  ?  Metformin caused the patient's legs to become weak  ? ? ?Family History  ?Problem Relation Age of Onset  ? Cancer Father   ? Stroke Sister   ? Stroke Brother   ? ? ?Prior to Admission medications   ?Medication Sig Start Date End Date Taking? Authorizing Provider  ?amiodarone (PACERONE) 200 MG tablet Take 200 mg by mouth daily.    [provider]  ?aspirin EC 81 MG tablet Take 81 mg by mouth daily. Swallow whole.    [provider]  ?budesonide-formoterol (SYMBICORT) 160-4.5 MCG/ACT inhaler Inhale 2 puffs into the lungs 2 (two) times daily. Morning & Bedtime.    [provider]  ?fluticasone (FLONASE) 50 MCG/ACT nasal spray Place 2 sprays into both nostrils 3 (three) times daily as needed for allergies or rhinitis.    [provider]  ?furosemide (LASIX) 40 MG tablet Take 40 mg by mouth daily at 12 noon.    [provider]  ?lisinopril (ZESTRIL) 2.5 MG tablet Take 2.5 mg by mouth daily. 10/23/21  [provider]  ?methocarbamol (ROBAXIN) 500 MG tablet Take 1 tablet (500 mg total) by mouth every 8 (eight) hours as needed for muscle spasms. 11/09/21   Ngetich, Dinah C, NP  ?montelukast (SINGULAIR) 10 MG tablet Take 10 mg by mouth in the morning.    [provider]  ?nitroGLYCERIN (NITROSTAT) 0.4 MG SL tablet Place 0.4 mg under the tongue every 5 (five) minutes as needed for chest pain.    [provider]  ?pantoprazole (PROTONIX) 40 MG tablet Take 40 mg by mouth daily.    [provider]  ?propranolol (INDERAL) 10 MG tablet Take 10 mg  by mouth in the morning.    [provider]  ?rosuvastatin (CRESTOR) 40 MG tablet Take 40 mg by mouth daily. 10/23/21   [provider]  ?venlafaxine XR (EFFEXOR-XR) 150 MG 24 hr capsule Take 150 mg by mouth daily with breakfast.    [provider]  ? ? ?Physical Exam: ?Vitals:  ? 12/14/21 1456 12/14/21 1515 12/14/21 1652 12/14/21 1730  ?BP:  (!) 130/103 127/85 132/86  ?Pulse: (!) 59 70 66 61  ?Resp: 13 19 15 14   ?Temp:      ?TempSrc:      ?SpO2: 96% 96% 98% 94%  ?Weight:      ?Height:      ? ?General:  Appears calm and comfortable and is in NAD ?Eyes:  PERRL, EOMI, normal lids, iris ?ENT:  grossly normal hearing, lips & tongue, dry mucous membranes; appropriate dentition ?Neck:  no LAD, masses or thyromegaly; no carotid bruits, no JVD ?Cardiovascular:  RRR, no m/r/g. No LE edema.  ?Respiratory:   CTA bilaterally with no wheezes/rales/rhonchi.  Normal respiratory effort. ?Abdomen:  soft, NT, ND, NABS. Ventral hernia  ?Back:   normal alignment, no CVAT ?Skin:  no rash or induration seen on limited exam ?Musculoskeletal:  grossly normal tone BUE/BLE, good ROM, no bony abnormality ?Lower extremity:  No LE edema.  Limited foot exam with no ulcerations.  2+ distal pulses. Except left PT nonpalpable  ?Psychiatric:  grossly normal mood and affect, speech fluent and appropriate, AOx3 ?Neurologic:  CN 2-12 grossly intact, moves all extremities in coordinated fashion, sensation intact ? ? ?Radiological Exams on Admission: ?Independently reviewed - see discussion in A/P where applicable ? ?DG Chest 1 View ? ?Result Date: 12/14/2021 ?CLINICAL DATA:  One month of increased sleepiness EXAM: CHEST  1 VIEW COMPARISON:  None Available. FINDINGS: Left chest pacemaker with leads overlying the right atrium and right ventricle. Numerous EKG leads are coiled over the left chest. Prior median sternotomy and CABG with a few fractured median sternotomy wires. Normal size heart.  Calcified tortuous thoracic aorta. No  focal airspace consolidation. No visible pleural effusion or pneumothorax. Left shoulder arthroplasty and severe degenerative change of the right shoulder. IMPRESSION: No acute cardiopulmonary disease. Electronically Signed   By: Dahlia Bailiff M.D.   On: 12/14/2021 12:56   ? ?EKG: Independently reviewed.  Atrial paced with rate 60; nonspecific ST changes with no evidence of acute ischemia ? ? ?Labs on Admission: I have personally reviewed the available labs and imaging studies at the time of the admission. ? ?Pertinent labs:   ?hgb: 17.2,  ?troponin 33>19, ? creatinine 1.76,  ?calcium: 10.8, ? ?Assessment and Plan: ?Principal Problem: ?  Chest pain ?Active Problems: ?  chest pain with known CAD s/p CABG and PCI  ?  Acute renal failure superimposed on stage 3b chronic kidney disease ([place]) ?  Excessive daytime  sleepiness ?  DM type 2 with diabetic peripheral neuropathy ([place]) ?  Chronic diastolic HF (heart failure) ([place]) ?  Aortic stenosis ?  A-fib ([place]) ?  Chronic respiratory failure with hypoxia ([facility]) ?  Essential hypertension ?  Mixed hyperlipidemia ?  PAD (peripheral artery disease) ([place]) ?  Symptomatic bradycardia ?  OSA (obstructive sleep apnea) ?  Anxiety with depression ?  ? ?Assessment and Plan: ?chest pain with known CAD s/p CABG and PCI  ?76 year old male presenting with chest pain and excessive daytime sleepiness with history of CABG/PCI and elevated troponins ?-obs to tele ?-cardiology consulted ?-echo pending. Last echo in 11/22: EF 60%, moderate AS ?-repeat echo pending  ?-troponin not typical for ACS pattern ?-LHC in 06/2021 showed non obstructive disease  ?-had work up underway for TAVR eval, LHC in 06/2021 showed that aortic valve was easily crossed likely signaling less stenosis than initially believed. F/u on echo  ?-continue ASA/NG/statin  ?-will need to establish care with cardiology here since moved from [facility]  ? ?Acute renal failure superimposed on stage 3b chronic kidney disease ([place]) ?From the  few results accessible in chart, baseline appears to be around 1.4-1.5.  ?Looks more dry on exam  ?Check UA ?Will hold ACE-I/lasix for now ?Strict I/o ?Gentle IVF at 75cc/hour x 5 hours  ?Trend  ? ?Excess

## 2021-12-14 NOTE — ED Provider Notes (Signed)
? ?Received signout from previous provider, please see his note for complete H&P.  This is a 76 year old male significant history of CAD status post CABG in 2015 also has significant history of moderate to severe aortic stenosis, heart failure, atrial fibrillation, symptomatic bradycardia status post PPM presenting with complaints of chest pain.  Symptoms been intermittent for the last month.  Patient was evaluated, labs obtained and independently visualized and interpreted by me and is remarkable for elevated troponin of 33 however improved to 19, evidence of AKI with creatinine of 1.74, chest x-ray without concerning finding, EKG with atrial paced rhythm and diffuse ST depression.  Cardiology has been consulted has seen and evaluated patient and felt that finding is not consistent with ACS however recommend admission by medicine, starting echocardiogram tomorrow as the concern is his symptom may be secondary to progressions of his aortic stenosis. ? ?Appreciate consultation from [facility] hospitalist, Dr. Rogers Blocker, who agrees to admit patient for further management. ? ?BP 132/86   Pulse 61   Temp 98.5 ?F (36.9 ?C) (Oral)   Resp 14   Ht 5' 6.5" (1.689 m)   Wt 95.8 kg   SpO2 94%   BMI 33.58 kg/m?  ? ?Results for orders placed or performed during the hospital encounter of 12/14/21  ?CBC  ?Result Value Ref Range  ? WBC 8.8 4.0 - 10.5 K/uL  ? RBC 6.43 (H) 4.22 - 5.81 MIL/uL  ? Hemoglobin 17.2 (H) 13.0 - 17.0 g/dL  ? HCT 54.1 (H) 39.0 - 52.0 %  ? MCV 84.1 80.0 - 100.0 fL  ? MCH 26.7 26.0 - 34.0 pg  ? MCHC 31.8 30.0 - 36.0 g/dL  ? RDW 19.1 (H) 11.5 - 15.5 %  ? Platelets 215 150 - 400 K/uL  ? nRBC 0.0 0.0 - 0.2 %  ?Comprehensive metabolic panel  ?Result Value Ref Range  ? Sodium 138 135 - 145 mmol/L  ? Potassium 3.9 3.5 - 5.1 mmol/L  ? Chloride 101 98 - 111 mmol/L  ? CO2 29 22 - 32 mmol/L  ? Glucose, Bld 108 (H) 70 - 99 mg/dL  ? BUN 22 8 - 23 mg/dL  ? Creatinine, Ser 1.76 (H) 0.61 - 1.24 mg/dL  ? Calcium 10.8 (H) 8.9 -  10.3 mg/dL  ? Total Protein 7.9 6.5 - 8.1 g/dL  ? Albumin 4.3 3.5 - 5.0 g/dL  ? AST 19 15 - 41 U/L  ? ALT 17 0 - 44 U/L  ? Alkaline Phosphatase 93 38 - 126 U/L  ? Total Bilirubin 1.1 0.3 - 1.2 mg/dL  ? GFR, Estimated 40 (L) >60 mL/min  ? Anion gap 8 5 - 15  ?Brain natriuretic peptide  ?Result Value Ref Range  ? B Natriuretic Peptide 43.2 0.0 - 100.0 pg/mL  ?TSH  ?Result Value Ref Range  ? TSH 2.122 0.350 - 4.500 uIU/mL  ?Lipase, blood  ?Result Value Ref Range  ? Lipase 28 11 - 51 U/L  ?Troponin I (High Sensitivity)  ?Result Value Ref Range  ? Troponin I (High Sensitivity) 33 (H) <18 ng/L  ?Troponin I (High Sensitivity)  ?Result Value Ref Range  ? Troponin I (High Sensitivity) 19 (H) <18 ng/L  ? ?DG Chest 1 View ? ?Result Date: 12/14/2021 ?CLINICAL DATA:  One month of increased sleepiness EXAM: CHEST  1 VIEW COMPARISON:  None Available. FINDINGS: Left chest pacemaker with leads overlying the right atrium and right ventricle. Numerous EKG leads are coiled over the left chest. Prior median sternotomy and CABG  with a few fractured median sternotomy wires. Normal size heart.  Calcified tortuous thoracic aorta. No focal airspace consolidation. No visible pleural effusion or pneumothorax. Left shoulder arthroplasty and severe degenerative change of the right shoulder. IMPRESSION: No acute cardiopulmonary disease. Electronically Signed   By: Dahlia Bailiff M.D.   On: 12/14/2021 12:56   ? ? ?  ?Domenic Moras, PA-C ?12/14/21 1807 ? ?  ?Jeanell Sparrow, DO ?12/15/21 0018 ? ?

## 2021-12-14 NOTE — Consult Note (Signed)
?Cardiology Consultation:  ? ?Patient ID: Johnny [name] ?MRN: [id number]; DOB: 1946/07/05 ? ?Admit date: 12/14/2021 ?Date of Consult: 12/14/2021 ? ?PCP:  Ngetich, Nelda Bucks, NP ?  ?[facility] Providers ?Cardiologist:  None      ? ? ?Patient Profile:  ? ?Johnny [name] is a 76 y.o. male with a hx of history of CAD status post CABG in 2015, moderate to severe aortic stenosis, diastolic heart failure, OSA atrial fibrillation, symptomatic bradycardia status post PPM who is being seen 12/14/2021 for the evaluation of chest pain at the request of Dr Pearline Cables. ? ?History of Present Illness:  ? ?Johnny [name] is a 76 year old male with a history of CAD status post CABG in 2015, moderate to severe aortic stenosis, diastolic heart failure, OSA atrial fibrillation, symptomatic bradycardia status post PPM who presents with chest pain.  He reports intermittent chest pain for the last month.  Describes as pain on left side of his chest, lasts for about 30 minutes.  Not brought on by exertion.  Also reports feeling short of breath and lightheaded. ? ?In the ED, initial vital signs notable for BP 105/83, pulse 63, SPO2 100% on room air.  Labs notable for creatinine 1.76 (creatinine 1.52 on OSH labs 09/27/2021), troponin 33, BNP 43, hemoglobin 17.2, platelets 215, TSH 2.1.  Chest x-ray unremarkable.  EKG shows atrial paced rhythm, diffuse ST depressions with aVR elevation ? ?Past Medical History:  ?Diagnosis Date  ? Atrial fibrillation ([place])   ? Deafness in left ear   ? Hypertension   ? ? ?Past Surgical History:  ?Procedure Laterality Date  ? [place]  ? Lower Back Surgery, Dr.Seltzer  ? [place]  ? Dr.Seltzer  ? SHOULDER SURGERY Left 1965  ? Dr.Seltzer  ? TONSILLECTOMY  1950  ? Dr.Perellie  ?  ? ? ?Inpatient Medications: ?Scheduled Meds: ? ?Continuous Infusions: ? ?PRN Meds: ?nitroGLYCERIN ? ?Allergies:   No Known Allergies ? ?Social History:   ?Social History  ? ?Socioeconomic History  ? Marital status: Widowed  ?   Spouse name: Not on file  ? Number of children: Not on file  ? Years of education: Not on file  ? Highest education level: Not on file  ?Occupational History  ? Not on file  ?Tobacco Use  ? Smoking status: Former  ?  Packs/day: 1.00  ?  Years: 10.00  ?  Pack years: 10.00  ?  Types: Cigarettes  ? Smokeless tobacco: Never  ?Substance and Sexual Activity  ? Alcohol use: Never  ? Drug use: Never  ? Sexual activity: Not on file  ?Other Topics Concern  ? Not on file  ?Social History Narrative  ? Tobacco use, amount per day now: None  ? Past tobacco use, amount per day: 1 pack  ? How many years did you use tobacco: 10 years.  ? Alcohol use (drinks per week): None  ? Diet:  ? Do you drink/eat things with caffeine: Yes  ? Marital status:  Widow                                What year were you married? 1972  ? Do you live in a house, apartment, assisted living, condo, trailer, etc.? House  ? Is it one or more stories? One  ? How many persons live in your home? One  ? Do you have pets in your home?( please list) No  ?  Highest Level of education completed? 12th grade.  ? Current or past profession: Building control surveyor, Biomedical scientist, Therapist, art.   ? Do you exercise?   No                               Type and how often?  ? Do you have a living will? Yes  ? Do you have a DNR form?     No                              If not, do you want to discuss one?  ? Do you have signed POA/HPOA forms?  Yes                      If so, please bring to you appointment  ?   ? Do you have any difficulty bathing or dressing yourself? No  ? Do you have any difficulty preparing food or eating? No  ? Do you have any difficulty managing your medications? No  ? Do you have any difficulty managing your finances? Yes  ? Do you have any difficulty affording your medications? No  ? ?Social Determinants of Health  ? ?Financial Resource Strain: Not on file  ?Food Insecurity: Not on file  ?Transportation Needs: Not on file  ?Physical Activity: Not on file  ?Stress: Not on  file  ?Social Connections: Not on file  ?Intimate Partner Violence: Not on file  ?  ?Family History:   ? ?Family History  ?Problem Relation Age of Onset  ? Cancer Father   ? Stroke Sister   ? Stroke Brother   ?  ? ?ROS:  ?Please see the history of present illness.  ? ?All other ROS reviewed and negative.    ? ?Physical Exam/Data:  ? ?Vitals:  ? 12/14/21 1415 12/14/21 1430 12/14/21 1445 12/14/21 1456  ?BP: (!) 120/98 129/88 (!) 125/97   ?Pulse: 63 70 64 (!) 59  ?Resp: 11 20 19 13   ?Temp:      ?TempSrc:      ?SpO2: 95% 97% 95% 96%  ?Weight:      ?Height:      ? ?No intake or output data in the 24 hours ending 12/14/21 1559 ? ?  12/14/2021  ? 11:42 AM 12/14/2021  ? 10:14 AM 11/09/2021  ?  2:55 PM  ?Last 3 Weights  ?Weight (lbs) 211 lb 3.2 oz 211 lb 3.2 oz 207 lb 12.8 oz  ?Weight (kg) 95.8 kg 95.8 kg 94.257 kg  ?   ?Body mass index is 33.58 kg/m?.  ?General:  in no acute distress ?HEENT: normal ?Neck: no JVD ?Cardiac:  normal S1, S2; RRR; 3 out of 6 systolic murmur ?Lungs:  clear to auscultation bilaterally, no wheezing, rhonchi or rales  ?Abd: soft, nontender ?Ext: no edema ?Musculoskeletal:  No deformities ?Skin: warm and dry  ?Neuro:  no focal abnormalities noted ?Psych:  Normal affect  ? ?EKG:  The EKG was personally reviewed and demonstrates:  EKG shows atrial paced rhythm, diffuse ST depressions with aVR elevation ?Telemetry:  Telemetry was personally reviewed and demonstrates: Sinus rhythm ? ?Relevant CV Studies: ? ? ?Laboratory Data: ? ?High Sensitivity Troponin:   ?Recent Labs  ?Lab 12/14/21 ?1353  ?TROPONINIHS 33*  ?   ?Chemistry ?Recent Labs  ?Lab 12/14/21 ?1353  ?NA 138  ?K 3.9  ?CL 101  ?CO2  29  ?GLUCOSE 108*  ?BUN 22  ?CREATININE 1.76*  ?CALCIUM 10.8*  ?GFRNONAA 40*  ?ANIONGAP 8  ?  ?Recent Labs  ?Lab 12/14/21 ?1353  ?PROT 7.9  ?ALBUMIN 4.3  ?AST 19  ?ALT 17  ?ALKPHOS 93  ?BILITOT 1.1  ? ?Lipids No results for input(s): CHOL, TRIG, HDL, LABVLDL, LDLCALC, CHOLHDL in the last 168 hours.  ?Hematology ?Recent  Labs  ?Lab 12/14/21 ?1353  ?WBC 8.8  ?RBC 6.43*  ?HGB 17.2*  ?HCT 54.1*  ?MCV 84.1  ?MCH 26.7  ?MCHC 31.8  ?RDW 19.1*  ?PLT 215  ? ?Thyroid  ?Recent Labs  ?Lab 12/14/21 ?1353  ?TSH 2.122  ?  ?BNP ?Recent Labs  ?Lab 12/14/21 ?1225  ?BNP 43.2  ?  ?DDimer No results for input(s): DDIMER in the last 168 hours. ? ? ?Radiology/Studies:  ?DG Chest 1 View ? ?Result Date: 12/14/2021 ?CLINICAL DATA:  One month of increased sleepiness EXAM: CHEST  1 VIEW COMPARISON:  None Available. FINDINGS: Left chest pacemaker with leads overlying the right atrium and right ventricle. Numerous EKG leads are coiled over the left chest. Prior median sternotomy and CABG with a few fractured median sternotomy wires. Normal size heart.  Calcified tortuous thoracic aorta. No focal airspace consolidation. No visible pleural effusion or pneumothorax. Left shoulder arthroplasty and severe degenerative change of the right shoulder. IMPRESSION: No acute cardiopulmonary disease. Electronically Signed   By: Dahlia Bailiff M.D.   On: 12/14/2021 12:56   ? ? ?Assessment and Plan:  ? ?Chest pain: Troponin 33>19, not consistent with an acute coronary syndrome.  History of CAD status post CABG.   ?-Continue aspirin, statin ?-Recommend starting with echocardiogram, as symptoms could be secondary to progression of his aortic stenosis.  Can decide on further ischemic work-up based on results of echo ? ?Aortic stenosis: Reportedly moderate to severe.  Check echocardiogram as above ? ?Chronic diastolic heart failure: On Lasix 40 mg daily.  He appears euvolemic and creatinine elevated compared to prior (appears 1.4-1.5), recommend holding Lasix for now. ? ?Atrial fibrillation: On amiodarone 200 mg daily, propranolol 10 mg daily.  He is in normal sinus rhythm.  Unclear why he is not on anticoagulation, will need to obtain records from his cardiologist in [place] ? ?Symptomatic bradycardia: Status post PPM.  Will need to obtain records concerning what device he  has and plan for interrogation ? ?Hypertension: On lisinopril 2.5 mg daily and propranolol 10 mg daily.  Appears controlled ? ?OSA: Reports he has not been using CPAP ? ?Hyperlipidemia: On rosuvastatin 40 mg

## 2021-12-14 NOTE — Assessment & Plan Note (Addendum)
Well controlled, A1C of 5.9 in 09/2021  Diet controlled -SSI

## 2021-12-14 NOTE — ED Notes (Signed)
Patient reminded of the need for a urine sample at this time 

## 2021-12-14 NOTE — ED Notes (Addendum)
CBG 153 

## 2021-12-14 NOTE — ED Notes (Signed)
Patient verbalizes sexually inappropriate judgements towards this RN when this RN is entering the room to take the patient's vitals and introduce herself. This RN asks the patient if we can keep the nurse to patient relationship professional and asks the patient to respect this RN as she will care for and respect him as a patient in the hospital. Patient begins to hysterically laugh at this RN. This RN asks what was funny? Patient then verbalizes understanding of the statement made by this RN.  ?

## 2021-12-15 ENCOUNTER — Observation Stay (HOSPITAL_BASED_OUTPATIENT_CLINIC_OR_DEPARTMENT_OTHER): Payer: Medicare Other

## 2021-12-15 DIAGNOSIS — N179 Acute kidney failure, unspecified: Secondary | ICD-10-CM

## 2021-12-15 DIAGNOSIS — R079 Chest pain, unspecified: Secondary | ICD-10-CM | POA: Diagnosis not present

## 2021-12-15 DIAGNOSIS — I480 Paroxysmal atrial fibrillation: Secondary | ICD-10-CM | POA: Diagnosis not present

## 2021-12-15 DIAGNOSIS — N1832 Chronic kidney disease, stage 3b: Secondary | ICD-10-CM

## 2021-12-15 DIAGNOSIS — E1142 Type 2 diabetes mellitus with diabetic polyneuropathy: Secondary | ICD-10-CM | POA: Diagnosis not present

## 2021-12-15 DIAGNOSIS — R778 Other specified abnormalities of plasma proteins: Secondary | ICD-10-CM

## 2021-12-15 DIAGNOSIS — I350 Nonrheumatic aortic (valve) stenosis: Secondary | ICD-10-CM | POA: Diagnosis not present

## 2021-12-15 LAB — CBC
HCT: 49.6 % (ref 39.0–52.0)
Hemoglobin: 15.8 g/dL (ref 13.0–17.0)
MCH: 26.8 pg (ref 26.0–34.0)
MCHC: 31.9 g/dL (ref 30.0–36.0)
MCV: 84.2 fL (ref 80.0–100.0)
Platelets: 202 10*3/uL (ref 150–400)
RBC: 5.89 MIL/uL — ABNORMAL HIGH (ref 4.22–5.81)
RDW: 18.6 % — ABNORMAL HIGH (ref 11.5–15.5)
WBC: 7.9 10*3/uL (ref 4.0–10.5)
nRBC: 0 % (ref 0.0–0.2)

## 2021-12-15 LAB — ECHOCARDIOGRAM COMPLETE
AR max vel: 1.15 cm2
AV Area VTI: 1.23 cm2
AV Area mean vel: 1.15 cm2
AV Mean grad: 22.4 mmHg
AV Peak grad: 39.6 mmHg
Ao pk vel: 3.15 m/s
Area-P 1/2: 2.65 cm2
Calc EF: 59 %
Height: 66.5 in
S' Lateral: 3 cm
Single Plane A2C EF: 50.5 %
Single Plane A4C EF: 62.9 %
Weight: 3379.21 oz

## 2021-12-15 LAB — BASIC METABOLIC PANEL
Anion gap: 8 (ref 5–15)
BUN: 24 mg/dL — ABNORMAL HIGH (ref 8–23)
CO2: 28 mmol/L (ref 22–32)
Calcium: 10 mg/dL (ref 8.9–10.3)
Chloride: 103 mmol/L (ref 98–111)
Creatinine, Ser: 1.67 mg/dL — ABNORMAL HIGH (ref 0.61–1.24)
GFR, Estimated: 42 mL/min — ABNORMAL LOW (ref >60)
Glucose, Bld: 82 mg/dL (ref 70–99)
Potassium: 4.5 mmol/L (ref 3.5–5.1)
Sodium: 139 mmol/L (ref 135–145)

## 2021-12-15 LAB — LIPID PANEL
Cholesterol: 150 mg/dL (ref 0–200)
HDL: 54 mg/dL (ref >40)
LDL Cholesterol: 62 mg/dL (ref 0–99)
Total CHOL/HDL Ratio: 2.8 RATIO
Triglycerides: 170 mg/dL — ABNORMAL HIGH (ref <150)
VLDL: 34 mg/dL (ref 0–40)

## 2021-12-15 LAB — TROPONIN I (HIGH SENSITIVITY)
Troponin I (High Sensitivity): 30 ng/L — ABNORMAL HIGH (ref <18)
Troponin I (High Sensitivity): 23 ng/L — ABNORMAL HIGH (ref <18)

## 2021-12-15 LAB — CBG MONITORING, ED
Glucose-Capillary: 105 mg/dL — ABNORMAL HIGH (ref 70–99)
Glucose-Capillary: 108 mg/dL — ABNORMAL HIGH (ref 70–99)

## 2021-12-15 LAB — GLUCOSE, CAPILLARY
Glucose-Capillary: 142 mg/dL — ABNORMAL HIGH (ref 70–99)
Glucose-Capillary: 83 mg/dL (ref 70–99)

## 2021-12-15 MED ORDER — VITAMIN B-12 1000 MCG PO TABS
1000.0000 ug | ORAL_TABLET | Freq: Every day | ORAL | Status: DC
  Administered 2021-12-15 – 2021-12-17 (×3): 1000 ug via ORAL
  Filled 2021-12-15 (×3): qty 1

## 2021-12-15 NOTE — Progress Notes (Signed)
?  Echocardiogram ?2D Echocardiogram has been performed. ? ?Xitlally Mooneyham  Veva Holes ?12/15/2021, 11:55 AM ?

## 2021-12-15 NOTE — Progress Notes (Signed)
? ?Progress Note ? ?Patient Name: Johnny [name] ?Date of Encounter: 12/15/2021 ? ?[facility] Cardiologist: None  ? ?Subjective  ? ?Creatinine mildly improved (1.76 > 1.67).  Reports episode of chest pain this morning.  Denies any dyspnea. ? ?Inpatient Medications  ?  ?Scheduled Meds: ? amiodarone  200 mg Oral Daily  ? aspirin EC  81 mg Oral Daily  ? enoxaparin (LOVENOX) injection  40 mg Subcutaneous Q24H  ? insulin aspart  0-9 Units Subcutaneous TID WC  ? pantoprazole  40 mg Oral QAC breakfast  ? rosuvastatin  40 mg Oral Daily  ? venlafaxine XR  150 mg Oral Q breakfast  ? ?Continuous Infusions: ? sodium chloride Stopped (12/15/21 0555)  ? ?PRN Meds: ?acetaminophen **OR** acetaminophen, nitroGLYCERIN  ? ?Vital Signs  ?  ?Vitals:  ? 12/15/21 0300 12/15/21 0400 12/15/21 0600 12/15/21 0900  ?BP: (!) 146/84 (!) 154/77 (!) 146/93 126/68  ?Pulse: 60 60 69 60  ?Resp: 15 13 15 16   ?Temp:      ?TempSrc:      ?SpO2: 97% 99% 94% 98%  ?Weight:      ?Height:      ? ?No intake or output data in the 24 hours ending 12/15/21 0923 ? ?  12/14/2021  ? 11:42 AM 12/14/2021  ? 10:14 AM 11/09/2021  ?  2:55 PM  ?Last 3 Weights  ?Weight (lbs) 211 lb 3.2 oz 211 lb 3.2 oz 207 lb 12.8 oz  ?Weight (kg) 95.8 kg 95.8 kg 94.257 kg  ?   ? ?Telemetry  ?  ?A-paced, 60bpm - Personally Reviewed ? ?ECG  ?  ?No new EKG - Personally Reviewed ? ?Physical Exam  ? ?GEN: No acute distress.   ?Neck: No JVD ?Cardiac: RRR, no murmurs, rubs, or gallops.  ?Respiratory: Clear to auscultation bilaterally. ?GI: Soft, nontender, non-distended  ?MS: No edema; No deformity. ?Neuro:  Nonfocal  ?Psych: Normal affect  ? ?Labs  ?  ?High Sensitivity Troponin:   ?Recent Labs  ?Lab 12/14/21 ?1353 12/14/21 ?1516  ?TROPONINIHS 33* 19*  ?   ?Chemistry ?Recent Labs  ?Lab 12/14/21 ?1353 12/15/21 ?0409  ?NA 138 139  ?K 3.9 4.5  ?CL 101 103  ?CO2 29 28  ?GLUCOSE 108* 82  ?BUN 22 24*  ?CREATININE 1.76* 1.67*  ?CALCIUM 10.8* 10.0  ?PROT 7.9  --   ?ALBUMIN 4.3  --   ?AST 19  --   ?ALT  17  --   ?ALKPHOS 93  --   ?BILITOT 1.1  --   ?GFRNONAA 40* 42*  ?ANIONGAP 8 8  ?  ?Lipids  ?Recent Labs  ?Lab 12/15/21 ?0409  ?CHOL 150  ?TRIG 170*  ?HDL 54  ?LDLCALC 62  ?CHOLHDL 2.8  ?  ?Hematology ?Recent Labs  ?Lab 12/14/21 ?1353 12/15/21 ?0409  ?WBC 8.8 7.9  ?RBC 6.43* 5.89*  ?HGB 17.2* 15.8  ?HCT 54.1* 49.6  ?MCV 84.1 84.2  ?MCH 26.7 26.8  ?MCHC 31.8 31.9  ?RDW 19.1* 18.6*  ?PLT 215 202  ? ?Thyroid  ?Recent Labs  ?Lab 12/14/21 ?1353  ?TSH 2.122  ?  ?BNP ?Recent Labs  ?Lab 12/14/21 ?1225  ?BNP 43.2  ?  ?DDimer No results for input(s): DDIMER in the last 168 hours.  ? ?Radiology  ?  ?DG Chest 1 View ? ?Result Date: 12/14/2021 ?CLINICAL DATA:  One month of increased sleepiness EXAM: CHEST  1 VIEW COMPARISON:  None Available. FINDINGS: Left chest pacemaker with leads overlying the right atrium and right ventricle. Numerous  EKG leads are coiled over the left chest. Prior median sternotomy and CABG with a few fractured median sternotomy wires. Normal size heart.  Calcified tortuous thoracic aorta. No focal airspace consolidation. No visible pleural effusion or pneumothorax. Left shoulder arthroplasty and severe degenerative change of the right shoulder. IMPRESSION: No acute cardiopulmonary disease. Electronically Signed   By: Maudry Mayhew M.D.   On: 12/14/2021 12:56   ? ?Cardiac Studies  ? ? ? ?Patient Profile  ?   ?76 y.o. male  with a hx of history of CAD status post CABG in 2015, moderate to severe aortic stenosis, diastolic heart failure, OSA atrial fibrillation, symptomatic bradycardia status post PPM who is being seen 12/14/2021 for the evaluation of chest pain at the request of Dr Wallace Cullens. ? ?Assessment & Plan  ?  ?Chest pain: Troponin 33>19, not consistent with an acute coronary syndrome.  History of CAD status post CABG.   ?-Coronary angiography 06/07/21: "normal left main with severe mid-LAD disease and patent LIMA graft to LAD but with slow flow in the distal graft with possible filling defect vs streaming;  in either case, the distal vessel is very small, too small for PCI. There were patent stents in second obtuse marginal branch. Distal circumflex was chronically occluded with right to left collaterals. Sequential SVG to two OM branches was chronically occluded. RCA contained a widely patent stent in proximal portion" ?-Continue aspirin, statin ?-Reports more chest pain this morning, will check EKG, troponin ?-Recommend starting with echocardiogram, as symptoms could be secondary to progression of his aortic stenosis.  Can decide on further ischemic work-up based on results of echo ?  ?Aortic stenosis: Reportedly moderate to severe.  Check echocardiogram as above ?  ?Chronic diastolic heart failure: On Lasix 40 mg daily.  He appears euvolemic and creatinine elevated compared to prior (appears 1.4-1.5), recommend holding Lasix for now. ?  ?Atrial fibrillation: On amiodarone 200 mg daily, propranolol 10 mg daily.  He is in normal sinus rhythm.  Unclear why he is not on anticoagulation, will need to obtain records from his cardiologist in [place] ?  ?Symptomatic bradycardia: Status post PPM.  Appears to have [employer] dual-chamber PPM, will interrogate ?  ?Hypertension: On lisinopril 2.5 mg daily and propranolol 10 mg daily.  Hold lisinopril for now given renal function ? ?AKI on CKD: Baseline creatinine appears 1.4-1.5.  Creatinine 1.76 on admission.  Appears euvolemic.  Holding Lasix and lisinopril, creatinine 1.67 today ?  ?OSA: Reports he has not been using CPAP ?  ?Hyperlipidemia: On rosuvastatin 40 mg daily.  LDL 62 ? ? ? ?For questions or updates, please contact [facility] ?Please consult [email or web address] for contact info under  ? ?  ?   ?Signed, ?Little Ishikawa, MD  ?12/15/2021, 9:23 AM   ? ?

## 2021-12-15 NOTE — Progress Notes (Signed)
?PROGRESS NOTE ? ? ? ?Johnny [name]  [id number] DOB: [birth year] DOA: 12/14/2021 ?PCP: Ngetich, Nelda Bucks, NP  ? ? ?Brief Narrative:  ?Johnny [name] is a 76 y.o. male with medical history significant of atrial fibrillation, HTN, CKD stage 3, T2DM, HLD, CAD s/p CABG/PCI, aortic valve stenosis, diastolic CHF, GERD, OSA, anxiety and depression who presented to his PCP office with complaints of chest pain and shortness of breath. He was in training for a job for the past 3 weeks and he kept following asleep. This past Friday they asked him to resign because of his excessive sleepiness.  ? ? ?Assessment and Plan: ?chest pain with known CAD s/p CABG and PCI  ?-cardiology consulted ?-echo pending. Last echo in 11/22: EF 60%, moderate AS ?-repeat echo pending read ?-LHC in 06/2021 showed non obstructive disease  ?-had work up underway for TAVR eval, LHC in 06/2021 showed that aortic valve was easily crossed likely signaling less stenosis than initially believed. F/u on echo  ?-continue ASA/NG/statin  ?-will need to establish care with cardiology here since moved from [facility]  ? ?Acute renal failure superimposed on stage 3b chronic kidney disease (HCC) ?- baseline appears to be around 1.4-1.5.  ?Holding ACE/lasix ? ? ?Excessive daytime sleepiness ?Cardiac w/u underway with repeat echo for AS ?- not having cpap for past 7 month could also be contributing factor ?-B12 lower end of normal ? ?DM type 2 with diabetic peripheral neuropathy ([place]) ?Well controlled, A1C of 5.9 in 09/2021  ?Diet controlled ?SSI and accuchecks per protocol  ? ?Chronic diastolic HF (heart failure) ([place]) ?Hold lasix with AKI ?Strict I/O and daily weights  ?LVEF ~ 50-54% with grade 1 diastolic dysfunction by transthoracic echocardiogram from 04/19/2021 ?Echo in 06/2021: EF of 60-65%, moderate aortic stenosis  ?Repeat echo pending  ? ? ?Aortic stenosis ?        06/2021 echo showed moderate AS ?LHC in 06/2021 showed non critical AS. Did this to start w/u for TAVR  Aortic valve was easily crossed likely signaling less stenosis than initially believed ?Recommended to f/u in valve clinic ?Repeat echo pending today  ? ? ?A-fib ([place]) ?In NSR, rate controlled ?Continue amiodarone and inderal ?He is unsure why anticoagulation was stopped ?Hopefully can get records from [facility]  ? ?Chronic respiratory failure with hypoxia (HCC) ?Was on 4L [place] at home prn, he states this was stolen in February of 2023  ?He was put on this after he had covid in 09/2020 ?He is satting fine on room air here, will continue to monitor ? ?Essential hypertension ?Well controlled ?Continue inderal, hold lasix/lisinopril with aki  ? ?Mixed hyperlipidemia ?-continue crestor  ? ?PAD (peripheral artery disease) ([place]) ?Stents in left leg ?Continue aSA/statin  ? ?Symptomatic bradycardia ?S/p PPM  ? ?OSA (obstructive sleep apnea) ?His cpap got stolen in February so he has not had it since that time ?Could be contributing to his drowsiness ?-outpatient referral sent in by PCP already ? ?Anxiety with depression ?Continue effexor  ? ? ? ? ?Obesity ?Estimated body mass index is 33.58 kg/m? as calculated from the following: ?  Height as of this encounter: 5' 6.5" (1.689 m). ?  Weight as of this encounter: 95.8 kg.  ? ? ?DVT prophylaxis: enoxaparin (LOVENOX) injection 40 mg Start: 12/14/21 1900 ? ?  Code Status: Full Code ?Family Communication:  ? ?Disposition Plan:  ?Level of care: Telemetry Cardiac ?Status is: Observation ?The patient will require care spanning > 2 midnights and should be moved to inpatient  because: needs echo ?  ? ?Consultants:  ?cards ? ? ?Subjective: ?Getting echo ? ?Objective: ?Vitals:  ? 12/15/21 0300 12/15/21 0400 12/15/21 0600 12/15/21 0900  ?BP: (!) 146/84 (!) 154/77 (!) 146/93 126/68  ?Pulse: 60 60 69 60  ?Resp: 15 13 15 16   ?Temp:      ?TempSrc:      ?SpO2: 97% 99% 94% 98%  ?Weight:      ?Height:      ? ?No intake or output data in the 24 hours ending 12/15/21 1134 ?Filed Weights  ? 12/14/21 1142   ?Weight: 95.8 kg  ? ? ?Examination: ? ? ?General: Appearance:    Obese male in no acute distress  ?   ?Lungs:     Clear to auscultation bilaterally, respirations unlabored  ?Heart:    Normal heart rate.   ? ?  ?MS:   All extremities are intact.  ?  ?Neurologic:   Awake, alert  ?  ? ? ? ?Data Reviewed: I have personally reviewed following labs and imaging studies ? ?CBC: ?Recent Labs  ?Lab 12/14/21 ?1353 12/15/21 ?0409  ?WBC 8.8 7.9  ?HGB 17.2* 15.8  ?HCT 54.1* 49.6  ?MCV 84.1 84.2  ?PLT 215 202  ? ?Basic Metabolic Panel: ?Recent Labs  ?Lab 12/14/21 ?1353 12/15/21 ?0409  ?NA 138 139  ?K 3.9 4.5  ?CL 101 103  ?CO2 29 28  ?GLUCOSE 108* 82  ?BUN 22 24*  ?CREATININE 1.76* 1.67*  ?CALCIUM 10.8* 10.0  ? ?GFR: ?Estimated Creatinine Clearance: 41.8 mL/min (A) (by C-G formula based on SCr of 1.67 mg/dL (H)). ?Liver Function Tests: ?Recent Labs  ?Lab 12/14/21 ?1353  ?AST 19  ?ALT 17  ?ALKPHOS 93  ?BILITOT 1.1  ?PROT 7.9  ?ALBUMIN 4.3  ? ?Recent Labs  ?Lab 12/14/21 ?1353  ?LIPASE 28  ? ?No results for input(s): AMMONIA in the last 168 hours. ?Coagulation Profile: ?No results for input(s): INR, PROTIME in the last 168 hours. ?Cardiac Enzymes: ?No results for input(s): CKTOTAL, CKMB, CKMBINDEX, TROPONINI in the last 168 hours. ?BNP (last 3 results) ?No results for input(s): PROBNP in the last 8760 hours. ?HbA1C: ?No results for input(s): HGBA1C in the last 72 hours. ?CBG: ?Recent Labs  ?Lab 12/14/21 ?2153 12/15/21 ?0809  ?GLUCAP 153* 105*  ? ?Lipid Profile: ?Recent Labs  ?  12/15/21 ?0409  ?CHOL 150  ?HDL 54  ?[place] 62  ?TRIG 170*  ?CHOLHDL 2.8  ? ?Thyroid Function Tests: ?Recent Labs  ?  12/14/21 ?1353  ?TSH 2.122  ? ?Anemia Panel: ?Recent Labs  ?  12/14/21 ?1419  ?VITAMINB12 204  ? ?Sepsis Labs: ?No results for input(s): PROCALCITON, LATICACIDVEN in the last 168 hours. ? ?No results found for this or any previous visit (from the past 240 hour(s)).  ? ? ? ? ? ?Radiology Studies: ?DG Chest 1 View ? ?Result Date:  12/14/2021 ?CLINICAL DATA:  One month of increased sleepiness EXAM: CHEST  1 VIEW COMPARISON:  None Available. FINDINGS: Left chest pacemaker with leads overlying the right atrium and right ventricle. Numerous EKG leads are coiled over the left chest. Prior median sternotomy and CABG with a few fractured median sternotomy wires. Normal size heart.  Calcified tortuous thoracic aorta. No focal airspace consolidation. No visible pleural effusion or pneumothorax. Left shoulder arthroplasty and severe degenerative change of the right shoulder. IMPRESSION: No acute cardiopulmonary disease. Electronically Signed   By: Dahlia Bailiff M.D.   On: 12/14/2021 12:56   ? ? ? ? ? ?Scheduled Meds: ?  amiodarone  200 mg Oral Daily  ? aspirin EC  81 mg Oral Daily  ? enoxaparin (LOVENOX) injection  40 mg Subcutaneous Q24H  ? insulin aspart  0-9 Units Subcutaneous TID WC  ? pantoprazole  40 mg Oral QAC breakfast  ? rosuvastatin  40 mg Oral Daily  ? venlafaxine XR  150 mg Oral Q breakfast  ? vitamin B-12  1,000 mcg Oral Daily  ? ?Continuous Infusions: ? sodium chloride Stopped (12/15/21 0555)  ? ? ? LOS: 0 days  ? ? ?Time spent: 45 minutes spent on chart review, discussion with nursing staff, consultants, updating family and interview/physical exam; more than 50% of that time was spent in counseling and/or coordination of care. ? ? ? ?Geradine Girt, DO ?[facility] ?Available via Epic secure chat 7am-7pm ?After these hours, please refer to coverage provider listed on [email or web address] ?12/15/2021, 11:34 AM   ?

## 2021-12-15 NOTE — Care Management Obs Status (Signed)
MEDICARE OBSERVATION STATUS NOTIFICATION ? ? ?Patient Details  ?Name: Johnny [name] ?MRN: [id number] ?Date of Birth: [birth year] ? ? ?Medicare Observation Status Notification Given:  Yes ? ? ? ?Carles Collet, RN ?12/15/2021, 3:22 PM ?

## 2021-12-15 NOTE — Hospital Course (Signed)
Johnny [name] is a 76 y.o. male with medical history significant of atrial fibrillation, HTN, CKD stage 3, T2DM, HLD, CAD s/p CABG/PCI, aortic valve stenosis, diastolic CHF, GERD, OSA, anxiety and depression who presented to his PCP office with complaints of chest pain and shortness of breath. He was in training for a job for the past 3 weeks and he kept following asleep. This past Friday they asked him to resign because of his excessive sleepiness. ?

## 2021-12-15 NOTE — ED Notes (Signed)
Breakfast order placed ?

## 2021-12-16 ENCOUNTER — Observation Stay (HOSPITAL_COMMUNITY): Payer: Medicare Other

## 2021-12-16 DIAGNOSIS — G4719 Other hypersomnia: Secondary | ICD-10-CM | POA: Diagnosis not present

## 2021-12-16 DIAGNOSIS — I5032 Chronic diastolic (congestive) heart failure: Secondary | ICD-10-CM | POA: Diagnosis not present

## 2021-12-16 DIAGNOSIS — N179 Acute kidney failure, unspecified: Secondary | ICD-10-CM | POA: Diagnosis not present

## 2021-12-16 DIAGNOSIS — I480 Paroxysmal atrial fibrillation: Secondary | ICD-10-CM | POA: Diagnosis not present

## 2021-12-16 DIAGNOSIS — I350 Nonrheumatic aortic (valve) stenosis: Secondary | ICD-10-CM

## 2021-12-16 DIAGNOSIS — E1142 Type 2 diabetes mellitus with diabetic polyneuropathy: Secondary | ICD-10-CM | POA: Diagnosis not present

## 2021-12-16 DIAGNOSIS — R079 Chest pain, unspecified: Secondary | ICD-10-CM | POA: Diagnosis not present

## 2021-12-16 LAB — BASIC METABOLIC PANEL
Anion gap: 7 (ref 5–15)
BUN: 21 mg/dL (ref 8–23)
CO2: 27 mmol/L (ref 22–32)
Calcium: 10.3 mg/dL (ref 8.9–10.3)
Chloride: 105 mmol/L (ref 98–111)
Creatinine, Ser: 1.57 mg/dL — ABNORMAL HIGH (ref 0.61–1.24)
GFR, Estimated: 46 mL/min — ABNORMAL LOW (ref >60)
Glucose, Bld: 76 mg/dL (ref 70–99)
Potassium: 4.7 mmol/L (ref 3.5–5.1)
Sodium: 139 mmol/L (ref 135–145)

## 2021-12-16 LAB — CBC
HCT: 48.8 % (ref 39.0–52.0)
Hemoglobin: 15.9 g/dL (ref 13.0–17.0)
MCH: 27.3 pg (ref 26.0–34.0)
MCHC: 32.6 g/dL (ref 30.0–36.0)
MCV: 83.7 fL (ref 80.0–100.0)
Platelets: 197 10*3/uL (ref 150–400)
RBC: 5.83 MIL/uL — ABNORMAL HIGH (ref 4.22–5.81)
RDW: 18.6 % — ABNORMAL HIGH (ref 11.5–15.5)
WBC: 8.4 10*3/uL (ref 4.0–10.5)
nRBC: 0 % (ref 0.0–0.2)

## 2021-12-16 LAB — GLUCOSE, CAPILLARY
Glucose-Capillary: 100 mg/dL — ABNORMAL HIGH (ref 70–99)
Glucose-Capillary: 112 mg/dL — ABNORMAL HIGH (ref 70–99)
Glucose-Capillary: 139 mg/dL — ABNORMAL HIGH (ref 70–99)
Glucose-Capillary: 204 mg/dL — ABNORMAL HIGH (ref 70–99)
Glucose-Capillary: 120 mg/dL — ABNORMAL HIGH (ref 70–99)

## 2021-12-16 MED ORDER — METOPROLOL TARTRATE 12.5 MG HALF TABLET
12.5000 mg | ORAL_TABLET | Freq: Two times a day (BID) | ORAL | Status: DC
  Administered 2021-12-16 – 2021-12-17 (×3): 12.5 mg via ORAL
  Filled 2021-12-16 (×3): qty 1

## 2021-12-16 MED ORDER — ASPIRIN 81 MG PO TBEC
81.0000 mg | DELAYED_RELEASE_TABLET | Freq: Every day | ORAL | Status: DC
  Administered 2021-12-16 – 2021-12-17 (×2): 81 mg via ORAL
  Filled 2021-12-16 (×2): qty 1

## 2021-12-16 MED ORDER — REGADENOSON 0.4 MG/5ML IV SOLN
INTRAVENOUS | Status: AC
  Administered 2021-12-16: 0.4 mg via INTRAVENOUS
  Filled 2021-12-16: qty 5

## 2021-12-16 MED ORDER — ISOSORBIDE MONONITRATE ER 30 MG PO TB24
30.0000 mg | ORAL_TABLET | Freq: Every day | ORAL | Status: DC
  Administered 2021-12-16 – 2021-12-17 (×2): 30 mg via ORAL
  Filled 2021-12-16 (×2): qty 1

## 2021-12-16 MED ORDER — TECHNETIUM TC 99M TETROFOSMIN IV KIT
31.0000 | PACK | Freq: Once | INTRAVENOUS | Status: AC | PRN
  Administered 2021-12-16: 31 via INTRAVENOUS

## 2021-12-16 MED ORDER — TECHNETIUM TC 99M TETROFOSMIN IV KIT
10.3000 | PACK | Freq: Once | INTRAVENOUS | Status: AC | PRN
  Administered 2021-12-16: 10.3 via INTRAVENOUS

## 2021-12-16 MED ORDER — REGADENOSON 0.4 MG/5ML IV SOLN
0.4000 mg | Freq: Once | INTRAVENOUS | Status: AC
  Filled 2021-12-16: qty 5

## 2021-12-16 NOTE — Progress Notes (Signed)
Pt complaining of squeezing chest pain 9/10 radiating to back. Pt given one dose of sublingual NTG as ordered. Pt BP 116/75 before dose. Pt BP 87/61 after dose. Pt states pain is now 5/10 will recheck BP and monitor pt closely for chest pain.

## 2021-12-16 NOTE — Plan of Care (Signed)
  Problem: Clinical Measurements: Goal: Will remain free from infection Outcome: Progressing Goal: Respiratory complications will improve Outcome: Progressing Goal: Cardiovascular complication will be avoided Outcome: Progressing   Problem: Pain Managment: Goal: General experience of comfort will improve Outcome: Not Progressing   Problem: Safety: Goal: Ability to remain free from injury will improve Outcome: Progressing

## 2021-12-16 NOTE — Progress Notes (Signed)
Pt refused CPAP for tonight he states he can't breathe when he has it on.  I advised patient if he changes his mind to have RN call RT and we can set him up.

## 2021-12-16 NOTE — Progress Notes (Signed)
PROGRESS NOTE    Johnny [name]  [id number] DOB: 1946-03-14 DOA: 12/14/2021 PCP: Sandrea Hughs, NP    Brief Narrative:  Johnny [name] is a 76 y.o. male with medical history significant of atrial fibrillation, HTN, CKD stage 3, T2DM, HLD, CAD s/p CABG/PCI, aortic valve stenosis, diastolic CHF, GERD, OSA, anxiety and depression who presented to his PCP office with complaints of chest pain and shortness of breath. He was in training for a job for the past 3 weeks and he kept following asleep. This past Friday they asked him to resign because of his excessive sleepiness.    Assessment and Plan: chest pain with known CAD s/p CABG and PCI  -cardiology consulted -echo done and similar to prior. Last echo in 11/22: EF 60%, moderate AS -LHC in 06/2021 showed non obstructive disease  -had work up underway for TAVR eval, LHC in 06/2021 showed that aortic valve was easily crossed likely signaling less stenosis than initially believed -continue ASA/NG/statin  -will need to establish care with cardiology here since moved from [facility]  -stress test 5/18  Acute renal failure superimposed on stage 3b chronic kidney disease ([place]) - baseline appears to be around 1.4-1.5.  Holding ACE/lasix   Excessive daytime sleepiness - not having cpap for past 7 month could also be contributing factor -B12 lower end of normal- replete PO for now- follow up outpatient  DM type 2 with diabetic peripheral neuropathy ([place]) Well controlled, A1C of 5.9 in 09/2021  Diet controlled -SSI  Chronic diastolic HF (heart failure) (HCC) Hold lasix with AKI Strict I/O and daily weights  -defer restarting lasix to cards    Aortic stenosis         06/2021 echo showed moderate AS LHC in 06/2021 showed non critical AS. Did this to start w/u for TAVR Aortic valve was easily crossed likely signaling less stenosis than initially believed -echo similar to prior with mod AS   A-fib (HCC) In NSR, rate controlled Continue  amiodarone and inderal He is unsure why anticoagulation was stopped Hopefully can get [facility] from [facility]  (per Epic chart review there was an end date of 10/26 so I question if the med accidentally fell of his list)  Chronic respiratory failure with hypoxia ([place]) Was on 4L [place] at home prn, he states this was stolen in February of 2023  He was put on this after he had covid in 09/2020 He is satting fine on room air here, will continue to monitor  Essential hypertension Well controlled Continue inderal, hold lasix/lisinopril with aki   Mixed hyperlipidemia -continue crestor   PAD (peripheral artery disease) ([place]) Stents in left leg Continue aSA/statin   Symptomatic bradycardia S/p PPM   OSA (obstructive sleep apnea) His cpap got stolen in February so he has not had it since that time Could be contributing to his drowsiness -outpatient referral sent in by PCP already -refused CPAP in hospital  Anxiety with depression Continue effexor      Obesity Estimated body mass index is 33.18 kg/m as calculated from the following:   Height as of this encounter: 5\' 6"  (1.676 m).   Weight as of this encounter: 93.3 kg.    DVT prophylaxis: enoxaparin (LOVENOX) injection 40 mg Start: 12/14/21 1900    Code Status: Full Code Family Communication:   Disposition Plan:  Level of care: Telemetry Cardiac Status is: Observation The patient will require care spanning > 2 midnights and should be moved to inpatient because: needs echo  Consultants:  cards   Subjective: NPO for stress test this AM Refused CPAP last night as he felt like he was smothering   Objective: Vitals:   12/15/21 1345 12/15/21 1420 12/15/21 2100 12/16/21 0529  BP: 122/76 (!) 144/105 (!) 148/79 (!) 148/97  Pulse: 62 63 60 63  Resp: 11 14 16 18   Temp:  (!) 97.5 F (36.4 C) 98.4 F (36.9 C) 97.8 F (36.6 C)  TempSrc:  Oral Oral Oral  SpO2: 99% 98% 98% 96%  Weight:  93.3 kg    Height:  5\' 6"  (1.676 m)       Intake/Output Summary (Last 24 hours) at 12/16/2021 [id number] Last data filed at 12/16/2021 0700 Gross per 24 hour  Intake 728.75 ml  Output 850 ml  Net -121.25 ml   Filed Weights   12/14/21 1142 12/15/21 1420  Weight: 95.8 kg 93.3 kg    Examination:   General: Appearance:    Obese male in no acute distress     Lungs:      respirations unlabored  Heart:    Normal heart rate.   MS:   All extremities are intact.   Neurologic:   Awake, alert       Data Reviewed: I have personally reviewed following labs and imaging studies  CBC: Recent Labs  Lab 12/14/21 1353 12/15/21 0409 12/16/21 0209  WBC 8.8 7.9 8.4  HGB 17.2* 15.8 15.9  HCT 54.1* 49.6 48.8  MCV 84.1 84.2 83.7  PLT 215 202 [id number]   Basic Metabolic Panel: Recent Labs  Lab 12/14/21 1353 12/15/21 0409 12/16/21 0209  NA 138 139 139  K 3.9 4.5 4.7  CL 101 103 105  CO2 29 28 27   GLUCOSE 108* 82 76  BUN 22 24* 21  CREATININE 1.76* 1.67* 1.57*  CALCIUM 10.8* 10.0 10.3   GFR: Estimated Creatinine Clearance: 43.5 mL/min (A) (by C-G formula based on SCr of 1.57 mg/dL (H)). Liver Function Tests: Recent Labs  Lab 12/14/21 1353  AST 19  ALT 17  ALKPHOS 93  BILITOT 1.1  PROT 7.9  ALBUMIN 4.3   Recent Labs  Lab 12/14/21 1353  LIPASE 28   No results for input(s): AMMONIA in the last 168 hours. Coagulation Profile: No results for input(s): INR, PROTIME in the last 168 hours. Cardiac Enzymes: No results for input(s): CKTOTAL, CKMB, CKMBINDEX, TROPONINI in the last 168 hours. BNP (last 3 results) No results for input(s): PROBNP in the last 8760 hours. HbA1C: No results for input(s): HGBA1C in the last 72 hours. CBG: Recent Labs  Lab 12/15/21 0809 12/15/21 1246 12/15/21 1701 12/15/21 2130 12/16/21 0756  GLUCAP 105* 108* 142* 83 100*   Lipid Profile: Recent Labs    12/15/21 0409  CHOL 150  HDL 54  LDLCALC 62  TRIG 170*  CHOLHDL 2.8   Thyroid Function Tests: Recent Labs    12/14/21 1353   TSH 2.122   Anemia Panel: Recent Labs    12/14/21 1419  VITAMINB12 204   Sepsis Labs: No results for input(s): PROCALCITON, LATICACIDVEN in the last 168 hours.  No results found for this or any previous visit (from the past 240 hour(s)).       Radiology Studies: DG Chest 1 View  Result Date: 12/14/2021 CLINICAL DATA:  One month of increased sleepiness EXAM: CHEST  1 VIEW COMPARISON:  None Available. FINDINGS: Left chest pacemaker with leads overlying the right atrium and right ventricle. Numerous EKG leads are coiled over the left chest. Prior  median sternotomy and CABG with a few fractured median sternotomy wires. Normal size heart.  Calcified tortuous thoracic aorta. No focal airspace consolidation. No visible pleural effusion or pneumothorax. Left shoulder arthroplasty and severe degenerative change of the right shoulder. IMPRESSION: No acute cardiopulmonary disease. Electronically Signed   By: Dahlia Bailiff M.D.   On: 12/14/2021 12:56   ECHOCARDIOGRAM COMPLETE  Result Date: 12/15/2021    ECHOCARDIOGRAM REPORT   Patient Name:   Johnny [name] Date of Exam: 12/15/2021 Medical Rec #:  [id number]     Height:       66.5 in Accession #:    [id number]    Weight:       211.2 lb Date of Birth:  10-25-1945    BSA:          2.058 m Patient Age:    28 years      BP:           135/96 mmHg Patient Gender: M             HR:           60 bpm. Exam Location:  Inpatient Procedure: 2D Echo, Cardiac Doppler and Color Doppler Indications:    Chest pain  History:        Patient has no prior history of Echocardiogram examinations.                 CHF, Prior CABG and Pacemaker, Aortic Valve Disease,                 Arrythmias:Atrial Fibrillation, Signs/Symptoms:Chest Pain; Risk                 Factors:Hypertension, Diabetes and HLD.  Sonographer:    Joette Catching RCS Referring Phys: [id number] Donato Heinz  Sonographer Comments: Image acquisition challenging due to patient body habitus. IMPRESSIONS  1.  Left ventricular ejection fraction, by estimation, is 55 to 60%. The left ventricle has normal function. The left ventricle has no regional wall motion abnormalities. There is mild left ventricular hypertrophy of the basal-septal segment. Left ventricular diastolic parameters are indeterminate.  2. Right ventricular systolic function is low normal. The right ventricular size is normal. There is normal pulmonary artery systolic pressure.  3. Left atrial size was mild to moderately dilated.  4. A small pericardial effusion is present.  5. The mitral valve is grossly normal. Trivial mitral valve regurgitation. No evidence of mitral stenosis.  6. The aortic valve is calcified. There is moderate calcification of the aortic valve. There is moderate thickening of the aortic valve. Aortic valve regurgitation is trivial. Moderate aortic valve stenosis. Aortic valve area, by VTI measures 1.23 cm. Aortic valve mean gradient measures 22.4 mmHg. Aortic valve Vmax measures 3.15 m/s.  7. Aortic dilatation noted. There is borderline dilatation of the ascending aorta, measuring 38 mm.  8. The inferior vena cava is normal in size with greater than 50% respiratory variability, suggesting right atrial pressure of 3 mmHg. Comparison(s): No prior Echocardiogram. Conclusion(s)/Recommendation(s): Otherwise normal echocardiogram, with minor abnormalities described in the report. Technically challenging images, but no severe wall motion abnormalities seen. Moderate aortic stenosis. FINDINGS  Left Ventricle: Left ventricular ejection fraction, by estimation, is 55 to 60%. The left ventricle has normal function. The left ventricle has no regional wall motion abnormalities. The left ventricular internal cavity size was normal in size. There is  mild left ventricular hypertrophy of the basal-septal segment. Left ventricular diastolic parameters are indeterminate. Right Ventricle: The right  ventricular size is normal. No increase in right  ventricular wall thickness. Right ventricular systolic function is low normal. There is normal pulmonary artery systolic pressure. The tricuspid regurgitant velocity is 2.51 m/s,  and with an assumed right atrial pressure of 3 mmHg, the estimated right ventricular systolic pressure is [id number] mmHg. Left Atrium: Left atrial size was mild to moderately dilated. Right Atrium: Right atrial size was normal in size. Pericardium: A small pericardial effusion is present. Mitral Valve: The mitral valve is grossly normal. Trivial mitral valve regurgitation. No evidence of mitral valve stenosis. Tricuspid Valve: The tricuspid valve is normal in structure. Tricuspid valve regurgitation is mild . No evidence of tricuspid stenosis. Aortic Valve: DI 0.37, consistent with moderate AS. The aortic valve is calcified. There is moderate calcification of the aortic valve. There is moderate thickening of the aortic valve. Aortic valve regurgitation is trivial. Moderate aortic stenosis is present. Aortic valve mean gradient measures 22.4 mmHg. Aortic valve peak gradient measures 39.6 mmHg. Aortic valve area, by VTI measures 1.23 cm. Pulmonic Valve: The pulmonic valve was not well visualized. Pulmonic valve regurgitation is not visualized. Aorta: Aortic dilatation noted. There is borderline dilatation of the ascending aorta, measuring 38 mm. Venous: The inferior vena cava is normal in size with greater than 50% respiratory variability, suggesting right atrial pressure of 3 mmHg. IAS/Shunts: The interatrial septum was not well visualized. Additional Comments: A device lead is visualized.  LEFT VENTRICLE PLAX 2D LVIDd:         3.80 cm     Diastology LVIDs:         3.00 cm     LV e' medial:    4.57 cm/s LV PW:         1.00 cm     LV E/e' medial:  11.8 LV IVS:        1.00 cm     LV e' lateral:   8.49 cm/s LVOT diam:     2.06 cm     LV E/e' lateral: 6.4 LV SV:         67 LV SV Index:   32 LVOT Area:     3.33 cm  LV Volumes (MOD) LV vol d, MOD  A2C: 41.8 ml LV vol d, MOD A4C: 53.4 ml LV vol s, MOD A2C: 20.7 ml LV vol s, MOD A4C: 19.8 ml LV SV MOD A2C:     21.1 ml LV SV MOD A4C:     53.4 ml LV SV MOD BP:      29.6 ml RIGHT VENTRICLE             IVC RV Basal diam:  3.40 cm     IVC diam: 1.10 cm RV Mid diam:    2.20 cm RV S prime:     10.40 cm/s TAPSE (M-mode): 1.4 cm LEFT ATRIUM           Index        RIGHT ATRIUM           Index LA diam:      3.20 cm 1.55 cm/m   RA Area:     14.40 cm LA Vol (A2C): 25.3 ml 12.29 ml/m  RA Volume:   34.80 ml  16.91 ml/m LA Vol (A4C): 76.7 ml 37.27 ml/m  AORTIC VALVE                     PULMONIC VALVE AV Area (Vmax):    1.15 cm  PV Vmax:       1.23 m/s AV Area (Vmean):   1.15 cm      PV Peak grad:  6.1 mmHg AV Area (VTI):     1.23 cm AV Vmax:           314.80 cm/s AV Vmean:          224.800 cm/s AV VTI:            0.540 m AV Peak Grad:      39.6 mmHg AV Mean Grad:      22.4 mmHg LVOT Vmax:         109.00 cm/s LVOT Vmean:        77.400 cm/s LVOT VTI:          0.200 m LVOT/AV VTI ratio: 0.37  AORTA Ao Root diam: 2.70 cm Ao Asc diam:  3.80 cm MITRAL VALVE               TRICUSPID VALVE MV Area (PHT): 2.65 cm    TR Peak grad:   25.2 mmHg MV Decel Time: 286 msec    TR Vmax:        251.00 cm/s MV E velocity: 54.00 cm/s                            SHUNTS                            Systemic VTI:  0.20 m                            Systemic Diam: 2.06 cm Buford Dresser MD Electronically signed by Buford Dresser MD Signature Date/Time: 12/15/2021/7:08:51 PM    Final         Scheduled Meds:  amiodarone  200 mg Oral Daily   aspirin EC  81 mg Oral Daily   enoxaparin (LOVENOX) injection  40 mg Subcutaneous Q24H   insulin aspart  0-9 Units Subcutaneous TID WC   pantoprazole  40 mg Oral QAC breakfast   rosuvastatin  40 mg Oral Daily   venlafaxine XR  150 mg Oral Q breakfast   vitamin B-12  1,000 mcg Oral Daily   Continuous Infusions:     LOS: 0 days    Time spent: 45 minutes spent on chart review,  discussion with nursing staff, consultants, updating family and interview/physical exam; more than 50% of that time was spent in counseling and/or coordination of care.    Geradine Girt, DO [facility] Available via Epic secure chat 7am-7pm After these hours, please refer to coverage provider listed on [email or web address] 12/16/2021, 8:28 AM
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
## 2021-12-17 ENCOUNTER — Other Ambulatory Visit (HOSPITAL_COMMUNITY): Payer: Self-pay

## 2021-12-17 ENCOUNTER — Encounter (HOSPITAL_COMMUNITY): Payer: Self-pay | Admitting: Family Medicine

## 2021-12-17 DIAGNOSIS — I480 Paroxysmal atrial fibrillation: Secondary | ICD-10-CM | POA: Diagnosis not present

## 2021-12-17 DIAGNOSIS — R079 Chest pain, unspecified: Secondary | ICD-10-CM | POA: Diagnosis not present

## 2021-12-17 DIAGNOSIS — E1142 Type 2 diabetes mellitus with diabetic polyneuropathy: Secondary | ICD-10-CM | POA: Diagnosis not present

## 2021-12-17 DIAGNOSIS — N179 Acute kidney failure, unspecified: Secondary | ICD-10-CM | POA: Diagnosis not present

## 2021-12-17 DIAGNOSIS — I5032 Chronic diastolic (congestive) heart failure: Secondary | ICD-10-CM

## 2021-12-17 DIAGNOSIS — I25810 Atherosclerosis of coronary artery bypass graft(s) without angina pectoris: Secondary | ICD-10-CM

## 2021-12-17 DIAGNOSIS — I25118 Atherosclerotic heart disease of native coronary artery with other forms of angina pectoris: Secondary | ICD-10-CM

## 2021-12-17 DIAGNOSIS — G4719 Other hypersomnia: Secondary | ICD-10-CM | POA: Diagnosis not present

## 2021-12-17 LAB — BASIC METABOLIC PANEL
Anion gap: 5 (ref 5–15)
BUN: 21 mg/dL (ref 8–23)
CO2: 29 mmol/L (ref 22–32)
Calcium: 10.2 mg/dL (ref 8.9–10.3)
Chloride: 105 mmol/L (ref 98–111)
Creatinine, Ser: 1.56 mg/dL — ABNORMAL HIGH (ref 0.61–1.24)
GFR, Estimated: 46 mL/min — ABNORMAL LOW (ref >60)
Glucose, Bld: 163 mg/dL — ABNORMAL HIGH (ref 70–99)
Potassium: 4.4 mmol/L (ref 3.5–5.1)
Sodium: 139 mmol/L (ref 135–145)

## 2021-12-17 LAB — GLUCOSE, CAPILLARY
Glucose-Capillary: 175 mg/dL — ABNORMAL HIGH (ref 70–99)
Glucose-Capillary: 105 mg/dL — ABNORMAL HIGH (ref 70–99)

## 2021-12-17 MED ORDER — APIXABAN 5 MG PO TABS
5.0000 mg | ORAL_TABLET | Freq: Two times a day (BID) | ORAL | 0 refills | Status: DC
  Filled 2021-12-17: qty 60, 30d supply, fill #0

## 2021-12-17 MED ORDER — APIXABAN 5 MG PO TABS: 5.0000 mg | ORAL_TABLET | Freq: Two times a day (BID) | ORAL | Status: DC

## 2021-12-17 MED ORDER — METOPROLOL TARTRATE 25 MG PO TABS
25.0000 mg | ORAL_TABLET | Freq: Two times a day (BID) | ORAL | 0 refills | Status: DC
  Filled 2021-12-17: qty 60, 30d supply, fill #0

## 2021-12-17 MED ORDER — ISOSORBIDE MONONITRATE ER 30 MG PO TB24
30.0000 mg | ORAL_TABLET | Freq: Every day | ORAL | 0 refills | Status: DC
  Filled 2021-12-17: qty 30, 30d supply, fill #0

## 2021-12-17 MED ORDER — METOPROLOL TARTRATE 25 MG PO TABS
25.0000 mg | ORAL_TABLET | Freq: Two times a day (BID) | ORAL | Status: DC
Start: 2021-12-17 — End: 2021-12-17
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
## 2021-12-17 NOTE — Plan of Care (Signed)

## 2021-12-17 NOTE — Discharge Summary (Signed)
Physician Discharge Summary  Johnny [name] [id number] DOB: 1946/07/28 DOA: 12/14/2021  PCP: Sandrea Hughs, NP  Admit date: 12/14/2021 Discharge date: 12/17/2021  Admitted From: Home Disposition:  Home  Recommendations for Outpatient Follow-up:  Follow up with PCP in 1-2 weeks Please obtain BMP/CBC in one week your next doctors visit.  Eliquis 5 mg twice daily, aspirin 81 mg daily, amiodarone 200 mg daily, metoprolol 25 mg twice daily, rosuvastatin 40 mg daily, Imdur 30 mg daily.  Recommend monitoring daily weights and can take Lasix 40 mg if gains more than 3 pounds in 1 day or 5 pounds in 1 week Outpatient close follow-up with cardiology team   Discharge Condition: Stable CODE STATUS: Full code Diet recommendation: Heart healthy  Brief/Interim Summary: 76 y.o. male with medical history significant of atrial fibrillation, HTN, CKD stage 3, T2DM, HLD, CAD s/p CABG/PCI, aortic valve stenosis, diastolic CHF, GERD, OSA, anxiety and depression who presented to his PCP office with complaints of chest pain and shortness of breath. He was in training for a job for the past 3 weeks and he kept following asleep. This past Friday they asked him to resign because of his excessive sleepiness.  Cardiology performed a nuclear stress test which was negative and did not show any acute ischemic changes.  Medication adjustments were made by cardiology team as mentioned above. Medically stable for discharge today.     Assessment & Plan:  Active Problems:   chest pain with known CAD s/p CABG and PCI    AKI (acute kidney injury) ([place])   Excessive daytime sleepiness   DM type 2 with diabetic peripheral neuropathy (HCC)   Chronic diastolic HF (heart failure) (HCC)   Aortic stenosis   A-fib (HCC)   Chronic respiratory failure with hypoxia (HCC)   Essential hypertension   Mixed hyperlipidemia   PAD (peripheral artery disease) (HCC)   Symptomatic bradycardia   OSA (obstructive sleep apnea)    Anxiety with depression     Chest pain CAD s/p CABG and PCI  -Seen by cardiology team.  Cardiac medications as mentioned above. -echo done and similar to prior. Last echo in 11/22: EF 60%, moderate AS -LHC in 06/2021 showed non obstructive disease  -had work up underway for TAVR eval, LHC in 06/2021 showed that aortic valve was easily crossed likely signaling less stenosis than initially believed -continue ASA/NG/statin.  Patient needs to follow a cardiologist in [place] - Nuclear stress test-did not show any acute ischemic or reversible changes.   CKD stage 2.  -Baseline 1.5 (feb 2023). Patient doesn't have AKI.    Excessive daytime sleepiness -Resolved   DM type 2 with diabetic peripheral neuropathy ([place]) Well controlled, A1C of 5.9 in 09/2021    Chronic diastolic HF (heart failure) (HCC) -Overall appears to be euvolemic.  Cardiology team following.  Continue current medications   Aortic stenosis -Moderate stenosis per echocardiogram.  Patient is adamant about getting his heart valve replaced prior to discharge.  Follow-up outpatient.  No acute signs of hemodynamic instability.   A-fib (HCC) -Currently in normal sinus rhythm.  Continue amiodarone, Lopressor twice daily, Eliquis.   Chronic respiratory failure with hypoxia (HCC) -Currently patient was on 4 L nasal cannula but currently on room air doing well.   Essential hypertension Medication adjustments as mentioned above.   Mixed hyperlipidemia -continue crestor    PAD (peripheral artery disease) ([place]) -Status post PCI.  Continue aspirin and statin   Symptomatic bradycardia S/p PPM    OSA (obstructive  sleep apnea) -Refused inpatient CPAP.  Would benefit from outpatient follow-up   Anxiety with depression -Continue Effexor XR           Body mass index is 33.18 kg/m.       Discharge Diagnoses:  Active Problems:   Coronary artery disease of native artery of native heart with stable angina  pectoris (HCC)   AKI (acute kidney injury) (HCC)   Excessive daytime sleepiness   DM type 2 with diabetic peripheral neuropathy (HCC)   Chronic diastolic HF (heart failure) (HCC)   Aortic stenosis   A-fib (HCC)   Chronic respiratory failure with hypoxia (HCC)   Essential hypertension   Mixed hyperlipidemia   PAD (peripheral artery disease) (HCC)   Symptomatic bradycardia   OSA (obstructive sleep apnea)   Anxiety with depression      Consultations: Cardiology  Subjective: Feels well no complaints.  Has some infrequent episode of chest pain.  Cleared by cardiology team.  Discharge Exam: Vitals:   12/17/21 0909 12/17/21 1144  BP: 125/84 115/76  Pulse: 61 60  Resp:  18  Temp:  97.7 F (36.5 C)  SpO2:  98%   Vitals:   12/16/21 2020 12/17/21 0517 12/17/21 0909 12/17/21 1144  BP: 100/77 105/79 125/84 115/76  Pulse: 62 60 61 60  Resp:  18  18  Temp:  97.8 F (36.6 C)  97.7 F (36.5 C)  TempSrc:  Oral  Oral  SpO2: 97% 97%  98%  Weight:      Height:        General: Pt is alert, awake, not in acute distress Cardiovascular: RRR, S1/S2 +, no rubs, no gallops Respiratory: CTA bilaterally, no wheezing, no rhonchi Abdominal: Soft, NT, ND, bowel sounds + Extremities: no edema, no cyanosis  Discharge Instructions   Allergies as of 12/17/2021       Reactions   Metformin And Related Other (See Comments)   Metformin caused the patient's legs to become weak        Medication List     STOP taking these medications    lisinopril 2.5 MG tablet Commonly known as: ZESTRIL   methocarbamol 500 MG tablet Commonly known as: Robaxin       TAKE these medications    amiodarone 200 MG tablet Commonly known as: PACERONE Take 200 mg by mouth daily.   apixaban 5 MG Tabs tablet Commonly known as: ELIQUIS Take 1 tablet (5 mg total) by mouth 2 (two) times daily.   aspirin EC 81 MG tablet Take 81 mg by mouth daily. Swallow whole.   fluticasone 50 MCG/ACT nasal  spray Commonly known as: FLONASE Place 2 sprays into both nostrils 3 (three) times daily as needed for allergies or rhinitis.   furosemide 40 MG tablet Commonly known as: LASIX Take 40 mg by mouth daily as needed for edema or fluid.   isosorbide mononitrate 30 MG 24 hr tablet Commonly known as: IMDUR Take 1 tablet (30 mg total) by mouth daily. Start taking on: Dec 18, 2021   metoprolol tartrate 25 MG tablet Commonly known as: LOPRESSOR Take 1 tablet (25 mg total) by mouth 2 (two) times daily.   montelukast 10 MG tablet Commonly known as: SINGULAIR Take 10 mg by mouth daily as needed (for allergies).   nitroGLYCERIN 0.4 MG SL tablet Commonly known as: NITROSTAT Place 0.4 mg under the tongue every 5 (five) minutes as needed for chest pain.   pantoprazole 40 MG tablet Commonly known as: PROTONIX Take 40 mg  by mouth daily before breakfast.   rosuvastatin 40 MG tablet Commonly known as: CRESTOR Take 40 mg by mouth daily.   triamcinolone cream 0.1 % Commonly known as: KENALOG Apply 1 application. topically 2 (two) times daily as needed (to affected areas of the throat and neck).   Tylenol 8 Hour Arthritis Pain 650 MG CR tablet Generic drug: acetaminophen Take 650-1,300 mg by mouth every 8 (eight) hours as needed for pain.   venlafaxine XR 150 MG 24 hr capsule Commonly known as: EFFEXOR-XR Take 150 mg by mouth daily with breakfast.        Allergies  Allergen Reactions   Metformin And Related Other (See Comments)    Metformin caused the patient's legs to become weak    You were cared for by a hospitalist during your hospital stay. If you have any questions about your discharge medications or the care you received while you were in the hospital after you are discharged, you can call the unit and asked to speak with the hospitalist on call if the hospitalist that took care of you is not available. Once you are discharged, your primary care physician will handle any  further medical issues. Please note that no refills for any discharge medications will be authorized once you are discharged, as it is imperative that you return to your primary care physician (or establish a relationship with a primary care physician if you do not have one) for your aftercare needs so that they can reassess your need for medications and monitor your lab values.   Procedures/Studies: DG Chest 1 View  Result Date: 12/14/2021 CLINICAL DATA:  One month of increased sleepiness EXAM: CHEST  1 VIEW COMPARISON:  None Available. FINDINGS: Left chest pacemaker with leads overlying the right atrium and right ventricle. Numerous EKG leads are coiled over the left chest. Prior median sternotomy and CABG with a few fractured median sternotomy wires. Normal size heart.  Calcified tortuous thoracic aorta. No focal airspace consolidation. No visible pleural effusion or pneumothorax. Left shoulder arthroplasty and severe degenerative change of the right shoulder. IMPRESSION: No acute cardiopulmonary disease. Electronically Signed   By: Dahlia Bailiff M.D.   On: 12/14/2021 12:56   NM Myocar Multi W/Spect W/Wall Motion / EF  Result Date: 12/16/2021 CLINICAL DATA:  Chest pain. Prior CABG. Diastolic congestive heart failure. Abnormal cardiac enzymes. EXAM: MYOCARDIAL IMAGING WITH SPECT (REST AND PHARMACOLOGIC-STRESS) GATED LEFT VENTRICULAR WALL MOTION STUDY LEFT VENTRICULAR EJECTION FRACTION TECHNIQUE: Standard myocardial SPECT imaging was performed after resting intravenous injection of 10.2 mCi Tc-19m tetrofosmin. Subsequently, intravenous infusion of Lexiscan was performed under the supervision of the Cardiology staff. At peak effect of the drug, 31.0 mCi Tc-14m tetrofosmin was injected intravenously and standard myocardial SPECT imaging was performed. Quantitative gated imaging was also performed to evaluate left ventricular wall motion, and estimate left ventricular ejection fraction. COMPARISON:  None  Available. FINDINGS: Perfusion: No decreased activity in the left ventricle on stress imaging to suggest reversible ischemia or infarction. Decreased myocardial activity in the inferior wall seen predominantly on resting images is most likely due to diaphragmatic attenuation given lack of inferior wall motion abnormality. Wall Motion: Normal left ventricular wall motion. No left ventricular dilation. Left Ventricular Ejection Fraction: 58 % End diastolic volume 88 ml End systolic volume 37 ml IMPRESSION: 1. Probable inferior wall diaphragmatic attenuation. No reversible ischemia or infarction. 2. Normal left ventricular wall motion. 3. Left ventricular ejection fraction 58% 4. Non invasive risk stratification*: Low *2012 Appropriate Use Criteria  for Coronary Revascularization Focused Update: J Am Coll Cardiol. [id number]. http://content.airportbarriers.com.aspx?articleid=1201161 Electronically Signed   By: Marlaine Hind M.D.   On: 12/16/2021 17:02   ECHOCARDIOGRAM COMPLETE  Result Date: 12/15/2021    ECHOCARDIOGRAM REPORT   Patient Name:   Johnny [name] Date of Exam: 12/15/2021 Medical Rec #:  [id number]     Height:       66.5 in Accession #:    [id number]    Weight:       211.2 lb Date of Birth:  Dec 27, 1945    BSA:          2.058 m Patient Age:    75 years      BP:           135/96 mmHg Patient Gender: M             HR:           60 bpm. Exam Location:  Inpatient Procedure: 2D Echo, Cardiac Doppler and Color Doppler Indications:    Chest pain  History:        Patient has no prior history of Echocardiogram examinations.                 CHF, Prior CABG and Pacemaker, Aortic Valve Disease,                 Arrythmias:Atrial Fibrillation, Signs/Symptoms:Chest Pain; Risk                 Factors:Hypertension, Diabetes and HLD.  Sonographer:    Joette Catching RCS Referring Phys: [id number] Donato Heinz  Sonographer Comments: Image acquisition challenging due to patient body habitus. IMPRESSIONS  1.  Left ventricular ejection fraction, by estimation, is 55 to 60%. The left ventricle has normal function. The left ventricle has no regional wall motion abnormalities. There is mild left ventricular hypertrophy of the basal-septal segment. Left ventricular diastolic parameters are indeterminate.  2. Right ventricular systolic function is low normal. The right ventricular size is normal. There is normal pulmonary artery systolic pressure.  3. Left atrial size was mild to moderately dilated.  4. A small pericardial effusion is present.  5. The mitral valve is grossly normal. Trivial mitral valve regurgitation. No evidence of mitral stenosis.  6. The aortic valve is calcified. There is moderate calcification of the aortic valve. There is moderate thickening of the aortic valve. Aortic valve regurgitation is trivial. Moderate aortic valve stenosis. Aortic valve area, by VTI measures 1.23 cm. Aortic valve mean gradient measures 22.4 mmHg. Aortic valve Vmax measures 3.15 m/s.  7. Aortic dilatation noted. There is borderline dilatation of the ascending aorta, measuring 38 mm.  8. The inferior vena cava is normal in size with greater than 50% respiratory variability, suggesting right atrial pressure of 3 mmHg. Comparison(s): No prior Echocardiogram. Conclusion(s)/Recommendation(s): Otherwise normal echocardiogram, with minor abnormalities described in the report. Technically challenging images, but no severe wall motion abnormalities seen. Moderate aortic stenosis. FINDINGS  Left Ventricle: Left ventricular ejection fraction, by estimation, is 55 to 60%. The left ventricle has normal function. The left ventricle has no regional wall motion abnormalities. The left ventricular internal cavity size was normal in size. There is  mild left ventricular hypertrophy of the basal-septal segment. Left ventricular diastolic parameters are indeterminate. Right Ventricle: The right ventricular size is normal. No increase in right  ventricular wall thickness. Right ventricular systolic function is low normal. There is normal pulmonary artery systolic pressure. The tricuspid regurgitant velocity is 2.51 m/s,  and with an assumed right atrial pressure of 3 mmHg, the estimated right ventricular systolic pressure is Q000111Q mmHg. Left Atrium: Left atrial size was mild to moderately dilated. Right Atrium: Right atrial size was normal in size. Pericardium: A small pericardial effusion is present. Mitral Valve: The mitral valve is grossly normal. Trivial mitral valve regurgitation. No evidence of mitral valve stenosis. Tricuspid Valve: The tricuspid valve is normal in structure. Tricuspid valve regurgitation is mild . No evidence of tricuspid stenosis. Aortic Valve: DI 0.37, consistent with moderate AS. The aortic valve is calcified. There is moderate calcification of the aortic valve. There is moderate thickening of the aortic valve. Aortic valve regurgitation is trivial. Moderate aortic stenosis is present. Aortic valve mean gradient measures 22.4 mmHg. Aortic valve peak gradient measures 39.6 mmHg. Aortic valve area, by VTI measures 1.23 cm. Pulmonic Valve: The pulmonic valve was not well visualized. Pulmonic valve regurgitation is not visualized. Aorta: Aortic dilatation noted. There is borderline dilatation of the ascending aorta, measuring 38 mm. Venous: The inferior vena cava is normal in size with greater than 50% respiratory variability, suggesting right atrial pressure of 3 mmHg. IAS/Shunts: The interatrial septum was not well visualized. Additional Comments: A device lead is visualized.  LEFT VENTRICLE PLAX 2D LVIDd:         3.80 cm     Diastology LVIDs:         3.00 cm     LV e' medial:    4.57 cm/s LV PW:         1.00 cm     LV E/e' medial:  11.8 LV IVS:        1.00 cm     LV e' lateral:   8.49 cm/s LVOT diam:     2.06 cm     LV E/e' lateral: 6.4 LV SV:         67 LV SV Index:   32 LVOT Area:     3.33 cm  LV Volumes (MOD) LV vol d, MOD  A2C: 41.8 ml LV vol d, MOD A4C: 53.4 ml LV vol s, MOD A2C: 20.7 ml LV vol s, MOD A4C: 19.8 ml LV SV MOD A2C:     21.1 ml LV SV MOD A4C:     53.4 ml LV SV MOD BP:      29.6 ml RIGHT VENTRICLE             IVC RV Basal diam:  3.40 cm     IVC diam: 1.10 cm RV Mid diam:    2.20 cm RV S prime:     10.40 cm/s TAPSE (M-mode): 1.4 cm LEFT ATRIUM           Index        RIGHT ATRIUM           Index LA diam:      3.20 cm 1.55 cm/m   RA Area:     14.40 cm LA Vol (A2C): 25.3 ml 12.29 ml/m  RA Volume:   34.80 ml  16.91 ml/m LA Vol (A4C): 76.7 ml 37.27 ml/m  AORTIC VALVE                     PULMONIC VALVE AV Area (Vmax):    1.15 cm      PV Vmax:       1.23 m/s AV Area (Vmean):   1.15 cm      PV Peak grad:  6.1 mmHg AV Area (VTI):  1.23 cm AV Vmax:           314.80 cm/s AV Vmean:          224.800 cm/s AV VTI:            0.540 m AV Peak Grad:      39.6 mmHg AV Mean Grad:      22.4 mmHg LVOT Vmax:         109.00 cm/s LVOT Vmean:        77.400 cm/s LVOT VTI:          0.200 m LVOT/AV VTI ratio: 0.37  AORTA Ao Root diam: 2.70 cm Ao Asc diam:  3.80 cm MITRAL VALVE               TRICUSPID VALVE MV Area (PHT): 2.65 cm    TR Peak grad:   25.2 mmHg MV Decel Time: 286 msec    TR Vmax:        251.00 cm/s MV E velocity: 54.00 cm/s                            SHUNTS                            Systemic VTI:  0.20 m                            Systemic Diam: 2.06 cm Buford Dresser MD Electronically signed by Buford Dresser MD Signature Date/Time: 12/15/2021/7:08:51 PM    Final      The results of significant diagnostics from this hospitalization (including imaging, microbiology, ancillary and laboratory) are listed below for reference.     Microbiology: No results found for this or any previous visit (from the past 240 hour(s)).   Labs: BNP (last 3 results) Recent Labs    12/14/21 1225  BNP Q000111Q   Basic Metabolic Panel: Recent Labs  Lab 12/14/21 1353 12/15/21 0409 12/16/21 0209 12/17/21 0742  NA 138 139  139 139  K 3.9 4.5 4.7 4.4  CL 101 103 105 105  CO2 29 28 27 29   GLUCOSE 108* 82 76 163*  BUN 22 24* 21 21  CREATININE 1.76* 1.67* 1.57* 1.56*  CALCIUM 10.8* 10.0 10.3 10.2   Liver Function Tests: Recent Labs  Lab 12/14/21 1353  AST 19  ALT 17  ALKPHOS 93  BILITOT 1.1  PROT 7.9  ALBUMIN 4.3   Recent Labs  Lab 12/14/21 1353  LIPASE 28   No results for input(s): AMMONIA in the last 168 hours. CBC: Recent Labs  Lab 12/14/21 1353 12/15/21 0409 12/16/21 0209  WBC 8.8 7.9 8.4  HGB 17.2* 15.8 15.9  HCT 54.1* 49.6 48.8  MCV 84.1 84.2 83.7  PLT 215 202 197   Cardiac Enzymes: No results for input(s): CKTOTAL, CKMB, CKMBINDEX, TROPONINI in the last 168 hours. BNP: Invalid input(s): POCBNP CBG: Recent Labs  Lab 12/16/21 1411 12/16/21 1603 12/16/21 2143 12/17/21 0740 12/17/21 1140  GLUCAP 139* 204* 120* 175* 105*   D-Dimer No results for input(s): DDIMER in the last 72 hours. Hgb A1c No results for input(s): HGBA1C in the last 72 hours. Lipid Profile Recent Labs    12/15/21 0409  CHOL 150  HDL 54  LDLCALC 62  TRIG 170*  CHOLHDL 2.8   Thyroid function studies Recent Labs    12/14/21 1353  TSH 2.122   Anemia work up Recent Labs    12/14/21 1419  VITAMINB12 204   Urinalysis    Component Value Date/Time   COLORURINE YELLOW 12/14/2021 2058   [place] 12/14/2021 2058   LABSPEC 1.019 12/14/2021 2058   [place] 5.0 12/14/2021 2058   [place] 12/14/2021 2058   [place] 12/14/2021 2058   [place] 12/14/2021 2058   [place] NEGATIVE 12/14/2021 2058   PROTEINUR NEGATIVE 12/14/2021 2058   NITRITE NEGATIVE 12/14/2021 2058   LEUKOCYTESUR NEGATIVE 12/14/2021 2058   Sepsis Labs Invalid input(s): PROCALCITONIN,  WBC,  LACTICIDVEN Microbiology No results found for this or any previous visit (from the past 240 hour(s)).   Time coordinating discharge:  I have spent 35 minutes face to face with the patient and on  the ward discussing the patients care, assessment, plan and disposition with other care givers. >50% of the time was devoted counseling the patient about the risks and benefits of treatment/Discharge disposition and coordinating care.   SIGNED:   Damita Lack, MD  [facility] 12/17/2021, 1:01 PM   If 7PM-7AM, please contact night-coverage

## 2021-12-17 NOTE — Progress Notes (Signed)
Patient given discharge instructions and stated understanding. 

## 2021-12-17 NOTE — Progress Notes (Signed)
SATURATION QUALIFICATIONS: (This note is used to comply with regulatory documentation for home oxygen)  Patient Saturations on Room Air at Rest = 93-98%  Patient Saturations on Room Air while Ambulating = 97%  Patient Saturations on 0 Liters of oxygen while Ambulating = N/A%  Please briefly explain why patient needs home oxygen:

## 2021-12-17 NOTE — Progress Notes (Signed)
PROGRESS NOTE    Johnny [name]  [id number] DOB: 1945-11-21 DOA: 12/14/2021 PCP: Sandrea Hughs, NP   Brief Narrative:  76 y.o. male with medical history significant of atrial fibrillation, HTN, CKD stage 3, T2DM, HLD, CAD s/p CABG/PCI, aortic valve stenosis, diastolic CHF, GERD, OSA, anxiety and depression who presented to his PCP office with complaints of chest pain and shortness of breath. He was in training for a job for the past 3 weeks and he kept following asleep. This past Friday they asked him to resign because of his excessive sleepiness.  Cardiology performed a nuclear stress test which was negative and did not show any acute ischemic changes.  At this time medication changes are planned to help with his symptoms.   Assessment & Plan:  Active Problems:   chest pain with known CAD s/p CABG and PCI    AKI (acute kidney injury) ([place])   Excessive daytime sleepiness   DM type 2 with diabetic peripheral neuropathy (HCC)   Chronic diastolic HF (heart failure) (HCC)   Aortic stenosis   A-fib (HCC)   Chronic respiratory failure with hypoxia (HCC)   Essential hypertension   Mixed hyperlipidemia   PAD (peripheral artery disease) (HCC)   Symptomatic bradycardia   OSA (obstructive sleep apnea)   Anxiety with depression    Chest pain CAD s/p CABG and PCI  -Seen by cardiology team. -echo done and similar to prior. Last echo in 11/22: EF 60%, moderate AS -LHC in 06/2021 showed non obstructive disease  -had work up underway for TAVR eval, LHC in 06/2021 showed that aortic valve was easily crossed likely signaling less stenosis than initially believed -continue ASA/NG/statin.  Patient needs to follow a cardiologist in [place] - Nuclear stress test-did not show any acute ischemic or reversible changes.   CKD stage 2.  -Baseline 1.5 (feb 2023). Patient doesn't have AKI.    Excessive daytime sleepiness -Resolved   DM type 2 with diabetic peripheral neuropathy ([place]) Well  controlled, A1C of 5.9 in 09/2021  -Accu-Cheks and sliding scale   Chronic diastolic HF (heart failure) (HCC) -Overall appears to be euvolemic.  Cardiology team following.  Continue current medications   Aortic stenosis -Moderate stenosis per echocardiogram.  Patient is adamant about getting his heart valve replaced prior to discharge.  Cardiology team following, will defer further management per their service.   A-fib (HCC) -Currently in normal sinus rhythm.  Continue amiodarone, Lopressor twice daily, Eliquis.   Chronic respiratory failure with hypoxia (HCC) -Currently patient was on 4 L nasal cannula but currently on room air doing well.   Essential hypertension Well controlled Continue inderal, hold lasix/lisinopril with aki    Mixed hyperlipidemia -continue crestor    PAD (peripheral artery disease) ([place]) -Status post PCI.  Continue aspirin and statin   Symptomatic bradycardia S/p PPM    OSA (obstructive sleep apnea) -Refused inpatient CPAP.  Would benefit from outpatient follow-up   Anxiety with depression -Continue Effexor XR        DVT prophylaxis: Eliquis Code Status: Full code Family Communication:   Patient is still having chest pain during my visit.  Maintain hospital stay until cleared by cardiology.    Subjective: Patient is still having chest pain off and on.  Denies any diaphoresis, shortness of breath, fevers and chills.  Examination:  General exam: Appears calm and comfortable  Respiratory system: Clear to auscultation. Respiratory effort normal. Cardiovascular system: S1 & S2 heard, RRR. No JVD, murmurs, rubs, gallops or clicks.  No pedal edema. Gastrointestinal system: Abdomen is nondistended, soft and nontender. No organomegaly or masses felt. Normal bowel sounds heard. Central nervous system: Alert and oriented. No focal neurological deficits. Extremities: Symmetric 5 x 5 power. Skin: No rashes, lesions or ulcers Psychiatry: Judgement and  insight appear normal. Mood & affect appropriate.     Objective: Vitals:   12/16/21 2020 12/17/21 0517 12/17/21 0909 12/17/21 1144  BP: 100/77 105/79 125/84 115/76  Pulse: 62 60 61 60  Resp:  18  18  Temp:  97.8 F (36.6 C)  97.7 F (36.5 C)  TempSrc:  Oral  Oral  SpO2: 97% 97%  98%  Weight:      Height:        Intake/Output Summary (Last 24 hours) at 12/17/2021 1248 Last data filed at 12/17/2021 0518 Gross per 24 hour  Intake 360 ml  Output --  Net 360 ml   Filed Weights   12/14/21 1142 12/15/21 1420  Weight: 95.8 kg 93.3 kg     Data Reviewed:   CBC: Recent Labs  Lab 12/14/21 1353 12/15/21 0409 12/16/21 0209  WBC 8.8 7.9 8.4  HGB 17.2* 15.8 15.9  HCT 54.1* 49.6 48.8  MCV 84.1 84.2 83.7  PLT 215 202 [id number]   Basic Metabolic Panel: Recent Labs  Lab 12/14/21 1353 12/15/21 0409 12/16/21 0209 12/17/21 0742  NA 138 139 139 139  K 3.9 4.5 4.7 4.4  CL 101 103 105 105  CO2 29 28 27 29   GLUCOSE 108* 82 76 163*  BUN 22 24* 21 21  CREATININE 1.76* 1.67* 1.57* 1.56*  CALCIUM 10.8* 10.0 10.3 10.2   GFR: Estimated Creatinine Clearance: 43.8 mL/min (A) (by C-G formula based on SCr of 1.56 mg/dL (H)). Liver Function Tests: Recent Labs  Lab 12/14/21 1353  AST 19  ALT 17  ALKPHOS 93  BILITOT 1.1  PROT 7.9  ALBUMIN 4.3   Recent Labs  Lab 12/14/21 1353  LIPASE 28   No results for input(s): AMMONIA in the last 168 hours. Coagulation Profile: No results for input(s): INR, PROTIME in the last 168 hours. Cardiac Enzymes: No results for input(s): CKTOTAL, CKMB, CKMBINDEX, TROPONINI in the last 168 hours. BNP (last 3 results) No results for input(s): PROBNP in the last 8760 hours. HbA1C: No results for input(s): HGBA1C in the last 72 hours. CBG: Recent Labs  Lab 12/16/21 1411 12/16/21 1603 12/16/21 2143 12/17/21 0740 12/17/21 1140  GLUCAP 139* 204* 120* 175* 105*   Lipid Profile: Recent Labs    12/15/21 0409  CHOL 150  HDL 54  LDLCALC 62   TRIG 170*  CHOLHDL 2.8   Thyroid Function Tests: Recent Labs    12/14/21 1353  TSH 2.122   Anemia Panel: Recent Labs    12/14/21 1419  VITAMINB12 204   Sepsis Labs: No results for input(s): PROCALCITON, LATICACIDVEN in the last 168 hours.  No results found for this or any previous visit (from the past 240 hour(s)).       Radiology Studies: NM Myocar Multi W/Spect W/Wall Motion / EF  Result Date: 12/16/2021 CLINICAL DATA:  Chest pain. Prior CABG. Diastolic congestive heart failure. Abnormal cardiac enzymes. EXAM: MYOCARDIAL IMAGING WITH SPECT (REST AND PHARMACOLOGIC-STRESS) GATED LEFT VENTRICULAR WALL MOTION STUDY LEFT VENTRICULAR EJECTION FRACTION TECHNIQUE: Standard myocardial SPECT imaging was performed after resting intravenous injection of 10.2 mCi Tc-56m tetrofosmin. Subsequently, intravenous infusion of Lexiscan was performed under the supervision of the Cardiology staff. At peak effect of the drug, 31.0 mCi  Tc-43m tetrofosmin was injected intravenously and standard myocardial SPECT imaging was performed. Quantitative gated imaging was also performed to evaluate left ventricular wall motion, and estimate left ventricular ejection fraction. COMPARISON:  None Available. FINDINGS: Perfusion: No decreased activity in the left ventricle on stress imaging to suggest reversible ischemia or infarction. Decreased myocardial activity in the inferior wall seen predominantly on resting images is most likely due to diaphragmatic attenuation given lack of inferior wall motion abnormality. Wall Motion: Normal left ventricular wall motion. No left ventricular dilation. Left Ventricular Ejection Fraction: 58 % End diastolic volume 88 ml End systolic volume 37 ml IMPRESSION: 1. Probable inferior wall diaphragmatic attenuation. No reversible ischemia or infarction. 2. Normal left ventricular wall motion. 3. Left ventricular ejection fraction 58% 4. Non invasive risk stratification*: Low *2012  Appropriate Use Criteria for Coronary Revascularization Focused Update: J Am Coll Cardiol. [id number]. [email or web address] Electronically Signed   By: Marlaine Hind M.D.   On: 12/16/2021 17:02        Scheduled Meds:  amiodarone  200 mg Oral Daily   aspirin EC  81 mg Oral Daily   enoxaparin (LOVENOX) injection  40 mg Subcutaneous Q24H   insulin aspart  0-9 Units Subcutaneous TID WC   isosorbide mononitrate  30 mg Oral Daily   metoprolol tartrate  12.5 mg Oral BID   pantoprazole  40 mg Oral QAC breakfast   rosuvastatin  40 mg Oral Daily   venlafaxine XR  150 mg Oral Q breakfast   vitamin B-12  1,000 mcg Oral Daily   Continuous Infusions:   LOS: 0 days   Time spent= 35 mins    Inanna Telford Arsenio Loader, MD [facility]  If 7PM-7AM, please contact night-coverage  12/17/2021, 12:48 PM
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
## 2021-12-22 ENCOUNTER — Other Ambulatory Visit (HOSPITAL_COMMUNITY): Payer: Self-pay

## 2021-12-27 NOTE — Progress Notes (Signed)
Cardiology Office Note:    Date:  01/10/2022   ID:  Amitai Cornely, DOB September 24, 1945, MRN [id number]  PCP:  Sandrea Hughs, NP  Cardiologist:  None  Electrophysiologist:  None   Referring MD: Sandrea Hughs, NP   Chief Complaint  Patient presents with   Coronary Artery Disease    History of Present Illness:    Johnny [name] is a 76 y.o. male with a hx of CAD status post CABG in 2015, moderate aortic stenosis, diastolic heart failure, OSA, atrial fibrillation, symptomatic bradycardia status post PPM who presents for hospital follow-up.  He was admitted 11/2021 with chest pain.  He had rmoved from [place] in 09/2021.  Echocardiogram 12/15/2021 showed EF 55 to 60%, low normal RV function, mild to moderate left atrial enlargement, small pericardial effusion, moderate aortic stenosis.  Lexiscan Myoview on 12/16/2021 showed no ischemia or infarction, EF 58%.  Since discharge from the hospital, he reports that he is doing better.  He states he has not had any recent chest pain.  Has been having shortness of breath, improves with inhaler use.  Denies any lightheadedness, syncope, or palpitations.  Reports weight has increased, up 10 pounds on home scale.  Had episode of epistaxis last night, otherwise denies any bleeding issues.   Wt Readings from Last 3 Encounters:  12/29/21 217 lb 3.2 oz (98.5 kg)  12/15/21 205 lb 9.6 oz (93.3 kg)  12/14/21 211 lb 3.2 oz (95.8 kg)     Past Medical History:  Diagnosis Date   Atrial fibrillation ([place])    Deafness in left ear    Hypertension    Symptomatic bradycardia 04/2021   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[place], [place]    Past Surgical History:  Procedure Laterality Date   BACK SURGERY  1975   Lower Back Surgery, [facility]   Dr.Perellie    Current Medications: Current Meds  Medication Sig   amiodarone (PACERONE) 200 MG tablet Take 200 mg by mouth  daily.   apixaban (ELIQUIS) 5 MG TABS tablet Take 1 tablet (5 mg total) by mouth 2 (two) times daily.   aspirin EC 81 MG tablet Take 81 mg by mouth daily. Swallow whole.   carvedilol (COREG) 6.25 MG tablet Take 1 tablet (6.25 mg total) by mouth 2 (two) times daily.   fluticasone (FLONASE) 50 MCG/ACT nasal spray Place 2 sprays into both nostrils 3 (three) times daily as needed for allergies or rhinitis.   isosorbide mononitrate (IMDUR) 30 MG 24 hr tablet Take 1 tablet (30 mg total) by mouth daily.   montelukast (SINGULAIR) 10 MG tablet Take 10 mg by mouth daily as needed (for allergies).   nitroGLYCERIN (NITROSTAT) 0.4 MG SL tablet Place 0.4 mg under the tongue every 5 (five) minutes as needed for chest pain.   pantoprazole (PROTONIX) 40 MG tablet Take 40 mg by mouth daily before breakfast.   rosuvastatin (CRESTOR) 40 MG tablet Take 40 mg by mouth daily.   triamcinolone cream (KENALOG) 0.1 % Apply 1 application. topically 2 (two) times daily as needed (to affected areas of the throat and neck).   TYLENOL 8 HOUR ARTHRITIS PAIN 650 MG CR tablet Take 650-1,300 mg by mouth every 8 (eight) hours as needed for pain.   venlafaxine XR (EFFEXOR-XR) 150 MG 24 hr capsule Take 150 mg by mouth daily with breakfast.   [DISCONTINUED] furosemide (LASIX)  40 MG tablet Take 40 mg by mouth daily as needed for edema or fluid.   [DISCONTINUED] metoprolol tartrate (LOPRESSOR) 25 MG tablet Take 1 tablet (25 mg total) by mouth 2 (two) times daily.     Allergies:   Metformin and related   Social History   Socioeconomic History   Marital status: Widowed    Spouse name: Not on file   Number of children: Not on file   Years of education: Not on file   Highest education level: Not on file  Occupational History   Not on file  Tobacco Use   Smoking status: Former    Packs/day: 1.00    Years: 10.00    Total pack years: 10.00    Types: Cigarettes   Smokeless tobacco: Never  Substance and Sexual Activity   Alcohol  use: Never   Drug use: Never   Sexual activity: Not on file  Other Topics Concern   Not on file  Social History Narrative   Tobacco use, amount per day now: None   Past tobacco use, amount per day: 1 pack   How many years did you use tobacco: 10 years.   Alcohol use (drinks per week): None   Diet:   Do you drink/eat things with caffeine: Yes   Marital status:  Widow                                What year were you married? 1972   Do you live in a house, apartment, assisted living, condo, trailer, etc.? House   Is it one or more stories? One   How many persons live in your home? One   Do you have pets in your home?( please list) No   Highest Level of education completed? 12th grade.   Current or past profession: Building control surveyor, Biomedical scientist, Therapist, art.    Do you exercise?   No                               Type and how often?   Do you have a living will? Yes   Do you have a DNR form?     No                              If not, do you want to discuss one?   Do you have signed POA/HPOA forms?  Yes                      If so, please bring to you appointment      Do you have any difficulty bathing or dressing yourself? No   Do you have any difficulty preparing food or eating? No   Do you have any difficulty managing your medications? No   Do you have any difficulty managing your finances? Yes   Do you have any difficulty affording your medications? No   Social Determinants of Radio broadcast assistant Strain: Not on file  Food Insecurity: Not on file  Transportation Needs: Not on file  Physical Activity: Not on file  Stress: Not on file  Social Connections: Not on file     Family History: The patient's family history includes Cancer in his father; Stroke in his brother and sister.  ROS:   Please see the history  of present illness.     All other systems reviewed and are negative.  EKGs/Labs/Other Studies Reviewed:    The following studies were reviewed today:   EKG:    12/29/2021: Atrial paced rhythm, rate 60, diffuse less than 1 mm ST depressions  Recent Labs: 12/14/2021: ALT 17; TSH 2.122 12/29/2021: BNP 130.2; BUN 25; Creatinine, Ser 1.41; Hemoglobin 15.2; Magnesium 1.9; Platelets 232; Potassium 4.9; Sodium 141  Recent Lipid Panel    Component Value Date/Time   CHOL 150 12/15/2021 0409   TRIG 170 (H) 12/15/2021 0409   HDL 54 12/15/2021 0409   CHOLHDL 2.8 12/15/2021 0409   VLDL 34 12/15/2021 0409   LDLCALC 62 12/15/2021 0409    Physical Exam:    VS:  BP 138/90   Pulse 60   Ht 5\' 6"  (1.676 m)   Wt 217 lb 3.2 oz (98.5 kg)   SpO2 97%   BMI 35.06 kg/m     Wt Readings from Last 3 Encounters:  12/29/21 217 lb 3.2 oz (98.5 kg)  12/15/21 205 lb 9.6 oz (93.3 kg)  12/14/21 211 lb 3.2 oz (95.8 kg)     GEN:  Well nourished, well developed in no acute distress HEENT: Normal NECK: No JVD; No carotid bruits LYMPHATICS: No lymphadenopathy CARDIAC: RRR, 2/6 systolic murmur RESPIRATORY:  Clear to auscultation without rales, wheezing or rhonchi  ABDOMEN: Soft, non-tender, non-distended MUSCULOSKELETAL:  No edema; No deformity  SKIN: Warm and dry NEUROLOGIC:  Alert and oriented x 3 PSYCHIATRIC:  Normal affect   ASSESSMENT:    1. CAD in native artery   2. S/P CABG (coronary artery bypass graft)   3. Aortic valve stenosis, etiology of cardiac valve disease unspecified   4. Chronic diastolic HF (heart failure) ([place])   5. OSA (obstructive sleep apnea)   6. Excessive daytime sleepiness   7. Pacemaker    PLAN:    CAD: status post CABG.  Coronary angiography 06/07/21: "normal left main with severe mid-LAD disease and patent LIMA graft to LAD but with slow flow in the distal graft with possible filling defect vs streaming; in either case, the distal vessel is very small, too small for PCI. There were patent stents in second obtuse marginal branch. Distal circumflex was chronically occluded with right to left collaterals. Sequential SVG to two OM  branches was chronically occluded. RCA contained a widely patent stent in proximal portion".  Echocardiogram 12/15/2021 showed EF 55 to 60%, low normal RV function, mild to moderate left atrial enlargement, small pericardial effusion, moderate aortic stenosis.  Lexiscan Myoview on 12/16/2021 showed no ischemia or infarction, EF 58%. -Continue aspirin, statin -Continue beta-blocker, will switch to carvedilol 6.25 mg twice daily for better BP control -Continue Imdur 30 mg daily.  Reports no recent chest pain   Aortic stenosis: Moderate on echocardiogram 12/15/2021.  Repeat echo in 1 year to monitor   Chronic diastolic heart failure: Lasix was held during admission 11/2021, he has appeared hypovolemic.  Discharged on p.o. Lasix 40 mg daily as needed if gains more than 3 pounds in 1 day or 5 pounds in 1 week.  He reports weight is up 10 pounds and started taking Lasix daily again.  Appears euvolemic on exam.  Will switch Lasix to 40 mg every other day.  Check BMET, magnesium, BNP   Atrial fibrillation: On amiodarone 200 mg daily, metoprolol 25 mg twice daily, Eliquis 5 mg twice daily.  In atrial paced rhythm.  Check CBC   Symptomatic bradycardia: Status post  [employer] PPM.  Refer to Dr. Sallyanne Kuster for device follow-up   Hypertension: On metoprolol and Imdur as above.  BP elevated, will switch metoprolol to carvedilol 6.25 mg twice daily   CKD stage IIIa: Baseline creatinine appears 1.4-1.5.  Check BMET   OSA: Reports he has been off CPAP for 8 months.  Will check sleep study and plan to restart   Hyperlipidemia: On rosuvastatin 40 mg daily.  LDL 62   RTC in 4 to 6 weeks     Medication Adjustments/Labs and Tests Ordered: Current medicines are reviewed at length with the patient today.  Concerns regarding medicines are outlined above.  Orders Placed This Encounter  Procedures   Basic metabolic panel   Brain natriuretic peptide   Magnesium   CBC   Ambulatory referral to Cardiac  Electrophysiology   EKG 12-Lead   ECHOCARDIOGRAM COMPLETE   Split night study   Meds ordered this encounter  Medications   carvedilol (COREG) 6.25 MG tablet    Sig: Take 1 tablet (6.25 mg total) by mouth 2 (two) times daily.    Dispense:  180 tablet    Refill:  3    STOP metoprolol   furosemide (LASIX) 40 MG tablet    Sig: Take 1 tablet (40 mg total) by mouth every other day.    Dispense:  45 tablet    Refill:  3    Patient Instructions  Medication Instructions:  STOP metoprolol  START carvedilol (Coreg) 6.25 mg two times daily Take furosemide (Lasix) 40 mg every other day  *If you need a refill on your cardiac medications before your next appointment, please call your pharmacy*   Lab Work: BMET, Mag, BNP, CBC today  If you have labs (blood work) drawn today and your tests are completely normal, you will receive your results only by: [place] (if you have MyChart) OR A paper copy in the mail If you have any lab test that is abnormal or we need to change your treatment, we will call you to review the results.   Testing/Procedures: Your physician has recommended that you have a sleep study. This test records several body functions during sleep, including: brain activity, eye movement, oxygen and carbon dioxide blood levels, heart rate and rhythm, breathing rate and rhythm, the flow of air through your mouth and nose, snoring, body muscle movements, and chest and belly movement.  Your physician has requested that you have an echocardiogram in 12 MONTHS. Echocardiography is a painless test that uses sound waves to create images of your heart. It provides your doctor with information about the size and shape of your heart and how well your heart's chambers and valves are working. This procedure takes approximately one hour. There are no restrictions for this procedure.  Follow-Up: At [facility], you and your health needs are our priority.  As part of our continuing  mission to provide you with exceptional heart care, we have created designated Provider Care Teams.  These Care Teams include your primary Cardiologist (physician) and Advanced Practice Providers (APPs -  Physician Assistants and Nurse Practitioners) who all work together to provide you with the care you need, when you need it.  We recommend signing up for the patient portal called "MyChart".  Sign up information is provided on this After Visit Summary.  MyChart is used to connect with patients for Virtual Visits (Telemedicine).  Patients are able to view lab/test results, encounter notes, upcoming appointments, etc.  Non-urgent messages can be sent  to your provider as well.   To learn more about what you can do with MyChart, go to [email or web address].    Your next appointment:   4-6 weeks with NP or PA 3 months with Dr. Gardiner Rhyme    Other Instructions You have been referred to: Dr. Sallyanne Kuster to manage your pacemaker   Important Information About Sugar         Signed, Donato Heinz, MD  01/10/2022 10:02 AM    [place]

## 2021-12-29 ENCOUNTER — Encounter: Payer: Self-pay | Admitting: Cardiology

## 2021-12-29 ENCOUNTER — Ambulatory Visit: Payer: Medicare Other | Admitting: Cardiology

## 2021-12-29 VITALS — BP 138/90 | HR 60 | Ht 66.0 in | Wt 217.2 lb

## 2021-12-29 DIAGNOSIS — Z950 Presence of cardiac pacemaker: Secondary | ICD-10-CM

## 2021-12-29 DIAGNOSIS — Z951 Presence of aortocoronary bypass graft: Secondary | ICD-10-CM | POA: Diagnosis not present

## 2021-12-29 DIAGNOSIS — I5032 Chronic diastolic (congestive) heart failure: Secondary | ICD-10-CM

## 2021-12-29 DIAGNOSIS — R001 Bradycardia, unspecified: Secondary | ICD-10-CM

## 2021-12-29 DIAGNOSIS — I350 Nonrheumatic aortic (valve) stenosis: Secondary | ICD-10-CM

## 2021-12-29 DIAGNOSIS — G4719 Other hypersomnia: Secondary | ICD-10-CM

## 2021-12-29 DIAGNOSIS — I2510 Atherosclerotic heart disease of native coronary artery without angina pectoris: Secondary | ICD-10-CM

## 2021-12-29 DIAGNOSIS — G4733 Obstructive sleep apnea (adult) (pediatric): Secondary | ICD-10-CM

## 2021-12-29 MED ORDER — FUROSEMIDE 40 MG PO TABS: 40.0000 mg | ORAL_TABLET | ORAL | 3 refills | Status: DC

## 2021-12-29 MED ORDER — CARVEDILOL 6.25 MG PO TABS: 6.2500 mg | ORAL_TABLET | Freq: Two times a day (BID) | ORAL | 3 refills | Status: DC

## 2021-12-29 NOTE — Patient Instructions (Addendum)
Medication Instructions:  STOP metoprolol  START carvedilol (Coreg) 6.25 mg two times daily Take furosemide (Lasix) 40 mg every other day  *If you need a refill on your cardiac medications before your next appointment, please call your pharmacy*   Lab Work: BMET, Mag, BNP, CBC today  If you have labs (blood work) drawn today and your tests are completely normal, you will receive your results only by: MyChart Message (if you have MyChart) OR A paper copy in the mail If you have any lab test that is abnormal or we need to change your treatment, we will call you to review the results.   Testing/Procedures: Your physician has recommended that you have a sleep study. This test records several body functions during sleep, including: brain activity, eye movement, oxygen and carbon dioxide blood levels, heart rate and rhythm, breathing rate and rhythm, the flow of air through your mouth and nose, snoring, body muscle movements, and chest and belly movement.  Your physician has requested that you have an echocardiogram in 12 MONTHS. Echocardiography is a painless test that uses sound waves to create images of your heart. It provides your doctor with information about the size and shape of your heart and how well your heart's chambers and valves are working. This procedure takes approximately one hour. There are no restrictions for this procedure.  Follow-Up: At [facility], you and your health needs are our priority.  As part of our continuing mission to provide you with exceptional heart care, we have created designated Provider Care Teams.  These Care Teams include your primary Cardiologist (physician) and Advanced Practice Providers (APPs -  Physician Assistants and Nurse Practitioners) who all work together to provide you with the care you need, when you need it.  We recommend signing up for the patient portal called "MyChart".  Sign up information is provided on this After Visit Summary.   MyChart is used to connect with patients for Virtual Visits (Telemedicine).  Patients are able to view lab/test results, encounter notes, upcoming appointments, etc.  Non-urgent messages can be sent to your provider as well.   To learn more about what you can do with MyChart, go to [email or web address].    Your next appointment:   4-6 weeks with NP or PA 3 months with Dr. Bjorn Pippin    Other Instructions You have been referred to: Dr. Royann Shivers to manage your pacemaker   Important Information About Sugar

## 2021-12-30 LAB — BASIC METABOLIC PANEL
BUN/Creatinine Ratio: 18 (ref 10–24)
BUN: 25 mg/dL (ref 8–27)
CO2: 24 mmol/L (ref 20–29)
Calcium: 10.3 mg/dL — ABNORMAL HIGH (ref 8.6–10.2)
Chloride: 104 mmol/L (ref 96–106)
Creatinine, Ser: 1.41 mg/dL — ABNORMAL HIGH (ref 0.76–1.27)
Glucose: 97 mg/dL (ref 70–99)
Potassium: 4.9 mmol/L (ref 3.5–5.2)
Sodium: 141 mmol/L (ref 134–144)
eGFR: 52 mL/min/{1.73_m2} — ABNORMAL LOW (ref >59)

## 2021-12-30 LAB — CBC
Hematocrit: 44.7 % (ref 37.5–51.0)
Hemoglobin: 15.2 g/dL (ref 13.0–17.7)
MCH: 28 pg (ref 26.6–33.0)
MCHC: 34 g/dL (ref 31.5–35.7)
MCV: 82 fL (ref 79–97)
Platelets: 232 10*3/uL (ref 150–450)
RBC: 5.43 x10E6/uL (ref 4.14–5.80)
RDW: 18.5 % — ABNORMAL HIGH (ref 11.6–15.4)
WBC: 9.3 10*3/uL (ref 3.4–10.8)

## 2021-12-30 LAB — MAGNESIUM: Magnesium: 1.9 mg/dL (ref 1.6–2.3)

## 2021-12-30 LAB — BRAIN NATRIURETIC PEPTIDE: BNP: 130.2 pg/mL — ABNORMAL HIGH (ref 0.0–100.0)

## 2022-01-05 ENCOUNTER — Other Ambulatory Visit (HOSPITAL_BASED_OUTPATIENT_CLINIC_OR_DEPARTMENT_OTHER): Payer: Self-pay

## 2022-01-05 ENCOUNTER — Telehealth (HOSPITAL_BASED_OUTPATIENT_CLINIC_OR_DEPARTMENT_OTHER): Payer: Self-pay

## 2022-01-05 NOTE — Telephone Encounter (Signed)
Transitions of Care Pharmacy   Call attempted for a pharmacy transitions of care follow-up. HIPAA appropriate voicemail was left with call back information provided.   Call attempt #1. Will follow-up in 2-3 days.    Jiles Crocker, PharmD Clinical Pharmacist Med [facility] 01/05/2022 3:39 PM

## 2022-01-12 ENCOUNTER — Other Ambulatory Visit (HOSPITAL_COMMUNITY): Payer: Self-pay

## 2022-01-12 ENCOUNTER — Telehealth (HOSPITAL_COMMUNITY): Payer: Self-pay

## 2022-01-12 NOTE — Telephone Encounter (Signed)
Transitions of Care Pharmacy   Call attempted for a pharmacy transitions of care follow-up. HIPAA appropriate voicemail was left with call back information provided.   Call attempt #2. Will follow-up in 2-3 days.   Jiles Crocker, PharmD Clinical Pharmacist Med [facility] 01/12/2022 10:11 AM

## 2022-01-13 ENCOUNTER — Telehealth (HOSPITAL_COMMUNITY): Payer: Self-pay

## 2022-01-13 NOTE — Telephone Encounter (Signed)
Transitions of Care Pharmacy   Call attempted for a pharmacy transitions of care follow-up. HIPAA appropriate voicemail was left with call back information provided.   Call attempt #3.   

## 2022-01-23 NOTE — Progress Notes (Deleted)
Office Visit    Patient Name: Johnny [name] Date of Encounter: 01/23/2022  Primary Care Provider:  Caesar Bookman, NP Primary Cardiologist:  Little Ishikawa, MD  Chief Complaint    76 year old male with a history of CAD s/p CABG in 2015, moderate aortic stenosis, chronic diastolic heart failure, persistent atrial fibrillation, symptomatic bradycardia s/p PPM, CKD stage IIIa, and OSA who presents for follow-up related to CAD and hypertension.  Past Medical History    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[place], [place]   Past Surgical History:  Procedure Laterality Date   BACK SURGERY  1975   Lower Back Surgery, Dr.Seltzer   BACK SURGERY  1985   Dr.Seltzer   SHOULDER SURGERY Left 1965   Dr.Seltzer   TONSILLECTOMY  1950   Dr.Perellie    Allergies  Allergies  Allergen Reactions   Metformin And Related Other (See Comments)    Metformin caused the patient's legs to become weak    History of Present Illness    76 year old male with the above past medical history including CAD s/p CABG in 2015, moderate aortic stenosis, chronic diastolic heart failure, persistent atrial fibrillation, symptomatic bradycardia s/p PPM, CKD stage IIIa, and OSA.  He moved from [place] to [place] in February 2023.  He has a history of CAD s/p CABG in 2015.  He underwent PPM plantation in the setting of symptomatic bradycardia in 2022.  Additionally, he has a history of persistent atrial fibrillation on amiodarone and Eliquis.  Cardiac catheterization in November 2022 showed normal left main with severe mid LAD disease and patent LIMA-LAD but with slow flow in the distal graft with possible filling defect versus streaming (too small for PCI), stents in the second obtuse marginal, chronically occluded distal circumflex with right to left collaterals,  chronically occluded SVG to 2 OM branches, patent stent to proximal RCA. He was hospitalized in May 2023 in the setting of chest pain.  Echocardiogram at the time showed EF 55 to 60%, low normal RV function, mild to moderate left atrial enlargement, small pericardial effusion, moderate aortic stenosis.  Repeat echocardiogram was recommended in 1 year.  Lexiscan Myoview on 12/16/2021 showed no ischemia or infarction, EF 58%.   He was last seen in the office on 12/29/2021 stable overall from a cardiac standpoint.  BP was slightly elevated, metoprolol was switched to carvedilol 6.25 mg twice daily.  He was also referred to Dr. Royann Shivers for device follow-up.  Repeat sleep study was ordered. He presents today for follow-up.  Since his last visit  Hypertension: CAD: Aortic valve stenosis: Chronic diastolic heart failure: Persistent atrial fibrillation: Symptomatic bradycardia: Hyperlipidemia: CKD stage IIIa: OSA: Disposition:  Home Medications    Current Outpatient Medications  Medication Sig Dispense Refill   amiodarone (PACERONE) 200 MG tablet Take 200 mg by mouth daily.     apixaban (ELIQUIS) 5 MG TABS tablet Take 1 tablet (5 mg total) by mouth 2 (two) times daily. 60 tablet 0   aspirin EC 81 MG tablet Take 81 mg by mouth daily. Swallow whole.     carvedilol (COREG) 6.25 MG tablet Take 1 tablet (6.25 mg total) by mouth 2 (two) times daily. 180 tablet 3   fluticasone (FLONASE) 50 MCG/ACT nasal spray Place 2 sprays into both nostrils 3 (three) times daily as needed for  allergies or rhinitis.     furosemide (LASIX) 40 MG tablet Take 1 tablet (40 mg total) by mouth every other day. 45 tablet 3   isosorbide mononitrate (IMDUR) 30 MG 24 hr tablet Take 1 tablet (30 mg total) by mouth daily. 30 tablet 0   montelukast (SINGULAIR) 10 MG tablet Take 10 mg by mouth daily as needed (for allergies).     nitroGLYCERIN (NITROSTAT) 0.4 MG SL tablet Place 0.4 mg under the tongue every 5 (five) minutes as needed  for chest pain.     pantoprazole (PROTONIX) 40 MG tablet Take 40 mg by mouth daily before breakfast.     rosuvastatin (CRESTOR) 40 MG tablet Take 40 mg by mouth daily.     triamcinolone cream (KENALOG) 0.1 % Apply 1 application. topically 2 (two) times daily as needed (to affected areas of the throat and neck).     TYLENOL 8 HOUR ARTHRITIS PAIN 650 MG CR tablet Take 650-1,300 mg by mouth every 8 (eight) hours as needed for pain.     venlafaxine XR (EFFEXOR-XR) 150 MG 24 hr capsule Take 150 mg by mouth daily with breakfast.     No current facility-administered medications for this visit.     Review of Systems    ***.  All other systems reviewed and are otherwise negative except as noted above.    Physical Exam    VS:  There were no vitals taken for this visit. , BMI There is no height or weight on file to calculate BMI.     GEN: Well nourished, well developed, in no acute distress. HEENT: normal. Neck: Supple, no JVD, carotid bruits, or masses. Cardiac: RRR, no murmurs, rubs, or gallops. No clubbing, cyanosis, edema.  Radials/DP/PT 2+ and equal bilaterally.  Respiratory:  Respirations regular and unlabored, clear to auscultation bilaterally. GI: Soft, nontender, nondistended, BS + x 4. MS: no deformity or atrophy. Skin: warm and dry, no rash. Neuro:  Strength and sensation are intact. Psych: Normal affect.  Accessory Clinical Findings    ECG personally reviewed by me today - *** - no acute changes.  Lab Results  Component Value Date   WBC 9.3 12/29/2021   HGB 15.2 12/29/2021   HCT 44.7 12/29/2021   MCV 82 12/29/2021   PLT 232 12/29/2021   Lab Results  Component Value Date   CREATININE 1.41 (H) 12/29/2021   BUN 25 12/29/2021   NA 141 12/29/2021   K 4.9 12/29/2021   CL 104 12/29/2021   CO2 24 12/29/2021   Lab Results  Component Value Date   ALT 17 12/14/2021   AST 19 12/14/2021   ALKPHOS 93 12/14/2021   BILITOT 1.1 12/14/2021   Lab Results  Component Value Date    CHOL 150 12/15/2021   HDL 54 12/15/2021   LDLCALC 62 12/15/2021   TRIG 170 (H) 12/15/2021   CHOLHDL 2.8 12/15/2021    No results found for: "HGBA1C"  Assessment & Plan    1.  ***   Joylene Grapes, NP 01/23/2022, 4:07 PM

## 2022-01-24 ENCOUNTER — Ambulatory Visit: Payer: Medicare Other | Admitting: Nurse Practitioner

## 2022-01-24 DIAGNOSIS — N1831 Chronic kidney disease, stage 3a: Secondary | ICD-10-CM

## 2022-01-24 DIAGNOSIS — Z950 Presence of cardiac pacemaker: Secondary | ICD-10-CM

## 2022-01-24 DIAGNOSIS — G4733 Obstructive sleep apnea (adult) (pediatric): Secondary | ICD-10-CM

## 2022-01-24 DIAGNOSIS — I5032 Chronic diastolic (congestive) heart failure: Secondary | ICD-10-CM

## 2022-01-24 DIAGNOSIS — Z951 Presence of aortocoronary bypass graft: Secondary | ICD-10-CM

## 2022-01-24 DIAGNOSIS — I2510 Atherosclerotic heart disease of native coronary artery without angina pectoris: Secondary | ICD-10-CM

## 2022-01-24 DIAGNOSIS — I10 Essential (primary) hypertension: Secondary | ICD-10-CM

## 2022-01-24 DIAGNOSIS — I350 Nonrheumatic aortic (valve) stenosis: Secondary | ICD-10-CM

## 2022-01-24 DIAGNOSIS — E785 Hyperlipidemia, unspecified: Secondary | ICD-10-CM

## 2022-01-24 DIAGNOSIS — R001 Bradycardia, unspecified: Secondary | ICD-10-CM

## 2022-01-25 ENCOUNTER — Other Ambulatory Visit: Payer: Self-pay | Admitting: *Deleted

## 2022-01-25 MED ORDER — VENLAFAXINE HCL ER 150 MG PO CP24: 150.0000 mg | ORAL_CAPSULE | Freq: Every day | ORAL | 1 refills | Status: DC

## 2022-01-27 ENCOUNTER — Ambulatory Visit (HOSPITAL_BASED_OUTPATIENT_CLINIC_OR_DEPARTMENT_OTHER): Payer: Medicare Other | Attending: Cardiology | Admitting: Cardiology

## 2022-01-27 DIAGNOSIS — G4733 Obstructive sleep apnea (adult) (pediatric): Secondary | ICD-10-CM | POA: Diagnosis not present

## 2022-01-27 DIAGNOSIS — I5032 Chronic diastolic (congestive) heart failure: Secondary | ICD-10-CM | POA: Diagnosis not present

## 2022-01-27 DIAGNOSIS — I350 Nonrheumatic aortic (valve) stenosis: Secondary | ICD-10-CM

## 2022-01-27 DIAGNOSIS — Z950 Presence of cardiac pacemaker: Secondary | ICD-10-CM

## 2022-01-27 DIAGNOSIS — Z951 Presence of aortocoronary bypass graft: Secondary | ICD-10-CM | POA: Diagnosis not present

## 2022-01-27 DIAGNOSIS — G4719 Other hypersomnia: Secondary | ICD-10-CM | POA: Diagnosis not present

## 2022-01-27 DIAGNOSIS — I2510 Atherosclerotic heart disease of native coronary artery without angina pectoris: Secondary | ICD-10-CM | POA: Diagnosis not present

## 2022-01-28 NOTE — Procedures (Signed)
   Patient Name: [name], Johnny Date: 01/27/2022 Gender: Male D.O.B: Jun 16, 1946 Age (years): 82 Referring Provider: Epifanio Lesches Height (inches): 66 Interpreting Physician: Armanda Magic MD, ABSM Weight (lbs): 220 RPSGT: Cherylann Parr BMI: 36 MRN: [id number] Neck Size: 20.50  CLINICAL INFORMATION Sleep Study Type: NPSG  Indication for sleep study: Fatigue, Obesity, Re-Evaluation, Snoring, Witnesses Apnea / Gasping During Sleep  Epworth Sleepiness Score: 24  SLEEP STUDY TECHNIQUE As per the AASM Manual for the Scoring of Sleep and Associated Events v2.3 (April 2016) with a hypopnea requiring 4% desaturations.  The channels recorded and monitored were frontal, central and occipital EEG, electrooculogram (EOG), submentalis EMG (chin), nasal and oral airflow, thoracic and abdominal wall motion, anterior tibialis EMG, snore microphone, electrocardiogram, and pulse oximetry.  MEDICATIONS Medications self-administered by patient taken the night of the study : N/A  SLEEP ARCHITECTURE The study was initiated at 9:50:58 PM and ended at 3:01:51 AM.  Sleep onset time was 9.7 minutes and the sleep efficiency was 70.8%. The total sleep time was 220.2 minutes.  Stage REM latency was 216.0 minutes.  The patient spent 6.1% of the night in stage N1 sleep, 85.2% in stage N2 sleep, 0.0% in stage N3 and 8.6% in REM.  Alpha intrusion was absent.  Supine sleep was 70.33%.  RESPIRATORY PARAMETERS The overall apnea/hypopnea index (AHI) was 60.5 per hour. There were 211 total apneas, including 187 obstructive, 1 central and 23 mixed apneas. There were 11 hypopneas and 0 RERAs.  The AHI during Stage REM sleep was 47.4 per hour.  AHI while supine was 60.5 per hour.  The mean oxygen saturation was 86.5%. The minimum SpO2 during sleep was 57.0%.  loud snoring was noted during this study.  CARDIAC DATA The 2 lead EKG demonstrated Atrial paced rhythm. The mean heart rate was 61.6  beats per minute. Other EKG findings include: PVCs.  LEG MOVEMENT DATA The total PLMS were 0 with a resulting PLMS index of 0.0. Associated arousal with leg movement index was 0.0 .  IMPRESSIONS - Severe obstructive sleep apnea occurred during this study (AHI = 60.5/h). - No significant central sleep apnea occurred during this study (CAI = 0.3/h). - Severe oxygen desaturation was noted during this study (Min O2 = 57.0%). - The patient snored with loud snoring volume. - Atrial paced rhythm with PVCs were noted during this study. - Clinically significant periodic limb movements did not occur during sleep. No significant associated arousals.  DIAGNOSIS - Obstructive Sleep Apnea (G47.33) - Nocturnal Hypoxemia (G47.36) RECOMMENDATIONS - Therapeutic CPAP titration to determine optimal pressure required to alleviate sleep disordered breathing. - Avoid alcohol, sedatives and other CNS depressants that may worsen sleep apnea and disrupt normal sleep architecture. - Sleep hygiene should be reviewed to assess factors that may improve sleep quality. - Weight management and regular exercise should be initiated or continued if appropriate.  [Electronically signed] 01/28/2022 10:15 AM  Armanda Magic MD, ABSM Diplomate, American Board of Sleep Medicine

## 2022-01-31 ENCOUNTER — Other Ambulatory Visit: Payer: Self-pay

## 2022-01-31 ENCOUNTER — Encounter: Payer: Self-pay | Admitting: Nurse Practitioner

## 2022-01-31 NOTE — Telephone Encounter (Signed)
Patient called to see if Dinah Ngetich,NP could refill his Eliquis 5 MG and Imdur 30 MG that he was prescribed by the hospital doctor and currently needs refills.

## 2022-01-31 NOTE — Telephone Encounter (Signed)
Imdur and Eliquis is managed by Cardiologist

## 2022-02-02 NOTE — Telephone Encounter (Signed)
Tried calling patient and no answer, left a message to return call to be advised of Dinah Ngetich,NP message.

## 2022-02-03 NOTE — Telephone Encounter (Signed)
Request is being forwarded to patients cardiologist, Dr.Schumann

## 2022-02-04 ENCOUNTER — Telehealth: Payer: Self-pay | Admitting: Cardiology

## 2022-02-04 MED ORDER — APIXABAN 5 MG PO TABS: 5.0000 mg | ORAL_TABLET | Freq: Two times a day (BID) | ORAL | 1 refills | Status: DC

## 2022-02-04 MED ORDER — ISOSORBIDE MONONITRATE ER 30 MG PO TB24
30.0000 mg | ORAL_TABLET | Freq: Every day | ORAL | 1 refills | Status: DC
Start: 2022-02-04 — End: 2022-02-15

## 2022-02-04 NOTE — Telephone Encounter (Signed)
Patient called to check on results of sleep study test.

## 2022-02-04 NOTE — Telephone Encounter (Signed)
Sleep study done by Dr. Mayford Knife Routed to sleep clinic pool for results

## 2022-02-08 ENCOUNTER — Telehealth: Payer: Self-pay | Admitting: *Deleted

## 2022-02-08 DIAGNOSIS — G4733 Obstructive sleep apnea (adult) (pediatric): Secondary | ICD-10-CM

## 2022-02-08 DIAGNOSIS — I25118 Atherosclerotic heart disease of native coronary artery with other forms of angina pectoris: Secondary | ICD-10-CM

## 2022-02-08 DIAGNOSIS — G4719 Other hypersomnia: Secondary | ICD-10-CM

## 2022-02-08 DIAGNOSIS — I5032 Chronic diastolic (congestive) heart failure: Secondary | ICD-10-CM

## 2022-02-08 NOTE — Telephone Encounter (Signed)
Pt returning call. Transferred to Harlin Heys, CMA.

## 2022-02-08 NOTE — Telephone Encounter (Signed)
-----   Message from Gaynelle Cage, CMA sent at 01/31/2022  8:57 AM EDT -----  ----- Message ----- From: Quintella Reichert, MD Sent: 01/28/2022  10:16 AM EDT To: Cv Div Sleep Studies  Please let patient know that they have sleep apnea.  Recommend therapeutic CPAP titration for treatment of patient's sleep disordered breathing.  If unable to perform an in lab titration then initiate ResMed auto CPAP from 4 to 15cm H2O with heated humidity and mask of choice and overnight pulse ox on CPAP.

## 2022-02-08 NOTE — Telephone Encounter (Signed)
The patient has been notified of the result. Left detailed message on voicemail and informed patient to call back.02/08/2022 4:24 PM

## 2022-02-08 NOTE — Telephone Encounter (Signed)
Return call: The patient has been notified of the result and verbalized understanding.  All questions (if any) were answered. Latrelle Dodrill, CMA 02/08/2022 4:46 PM    Precert titration

## 2022-02-15 ENCOUNTER — Other Ambulatory Visit: Payer: Self-pay

## 2022-02-15 MED ORDER — ISOSORBIDE MONONITRATE ER 30 MG PO TB24: 30.0000 mg | ORAL_TABLET | Freq: Every day | ORAL | 1 refills | Status: DC

## 2022-02-15 MED ORDER — APIXABAN 5 MG PO TABS: 5.0000 mg | ORAL_TABLET | Freq: Two times a day (BID) | ORAL | 1 refills | Status: DC

## 2022-02-15 MED ORDER — AMIODARONE HCL 200 MG PO TABS: 200.0000 mg | ORAL_TABLET | Freq: Every day | ORAL | 1 refills | Status: DC

## 2022-02-15 MED ORDER — CARVEDILOL 6.25 MG PO TABS: 6.2500 mg | ORAL_TABLET | Freq: Two times a day (BID) | ORAL | 3 refills | Status: DC

## 2022-02-15 MED ORDER — PANTOPRAZOLE SODIUM 40 MG PO TBEC
40.0000 mg | DELAYED_RELEASE_TABLET | Freq: Every day | ORAL | 3 refills | Status: DC
Start: 2022-02-15 — End: 2022-03-25

## 2022-02-15 MED ORDER — ROSUVASTATIN CALCIUM 40 MG PO TABS: 40.0000 mg | ORAL_TABLET | Freq: Every day | ORAL | 3 refills | Status: DC

## 2022-02-15 MED ORDER — FUROSEMIDE 40 MG PO TABS
40.0000 mg | ORAL_TABLET | ORAL | 3 refills | Status: DC
Start: 2022-02-15 — End: 2022-03-25

## 2022-02-15 MED ORDER — VENLAFAXINE HCL ER 150 MG PO CP24: 150.0000 mg | ORAL_CAPSULE | Freq: Every day | ORAL | 1 refills | Status: DC

## 2022-02-15 NOTE — Telephone Encounter (Signed)
Refill medication as requested.  

## 2022-02-17 ENCOUNTER — Encounter: Payer: Self-pay | Admitting: Physician Assistant

## 2022-02-17 ENCOUNTER — Ambulatory Visit (INDEPENDENT_AMBULATORY_CARE_PROVIDER_SITE_OTHER): Payer: Medicare Other | Admitting: Physician Assistant

## 2022-02-17 ENCOUNTER — Telehealth: Payer: Self-pay | Admitting: Physician Assistant

## 2022-02-17 VITALS — BP 140/80 | HR 59 | Ht 65.0 in | Wt 212.0 lb

## 2022-02-17 DIAGNOSIS — I350 Nonrheumatic aortic (valve) stenosis: Secondary | ICD-10-CM

## 2022-02-17 DIAGNOSIS — I4891 Unspecified atrial fibrillation: Secondary | ICD-10-CM

## 2022-02-17 DIAGNOSIS — I5032 Chronic diastolic (congestive) heart failure: Secondary | ICD-10-CM

## 2022-02-17 DIAGNOSIS — I10 Essential (primary) hypertension: Secondary | ICD-10-CM

## 2022-02-17 DIAGNOSIS — G4733 Obstructive sleep apnea (adult) (pediatric): Secondary | ICD-10-CM

## 2022-02-17 DIAGNOSIS — Z950 Presence of cardiac pacemaker: Secondary | ICD-10-CM | POA: Diagnosis not present

## 2022-02-17 DIAGNOSIS — E785 Hyperlipidemia, unspecified: Secondary | ICD-10-CM

## 2022-02-17 DIAGNOSIS — I2510 Atherosclerotic heart disease of native coronary artery without angina pectoris: Secondary | ICD-10-CM | POA: Diagnosis not present

## 2022-02-17 DIAGNOSIS — Z951 Presence of aortocoronary bypass graft: Secondary | ICD-10-CM

## 2022-02-17 NOTE — Patient Instructions (Signed)
Medication Instructions:  Your physician recommends that you continue on your current medications as directed. Please refer to the Current Medication list given to you today.  *If you need a refill on your cardiac medications before your next appointment, please call your pharmacy*   Lab Work: NOne If you have labs (blood work) drawn today and your tests are completely normal, you will receive your results only by: MyChart Message (if you have MyChart) OR A paper copy in the mail If you have any lab test that is abnormal or we need to change your treatment, we will call you to review the results.  Follow-Up: At [facility], you and your health needs are our priority.  As part of our continuing mission to provide you with exceptional heart care, we have created designated Provider Care Teams.  These Care Teams include your primary Cardiologist (physician) and Advanced Practice Providers (APPs -  Physician Assistants and Nurse Practitioners) who all work together to provide you with the care you need, when you need it.  Your next appointment:   02/21/22 appt at 9:20 AM  The format for your next appointment:   In Person  Provider:   Dr Royann Shivers  Important Information About Sugar

## 2022-02-17 NOTE — Progress Notes (Signed)
Office Visit    Patient Name: Johnny [name] Date of Encounter: 02/17/2022  PCP:  Caesar Bookman, NP   [facility]  Cardiologist:  Little Ishikawa, MD  Advanced Practice Provider:  No care team member to display Electrophysiologist:  None   Chief Complaint    Johnny [name] is a 76 y.o. male with a hx of CAD status post CABG, aortic stenosis, chronic diastolic heart failure, atrial fibrillation, symptomatic bradycardia, hypertension, CKD stage IIIa, OSA, and hyperlipidemia presents today for follow-up appointment.   Patient was admitted 11/2021 with chest pain.  He had recently moved from [facility] February 2023.  Echocardiogram 12/15/2021 showed EF 55 to 60%, low normal RV function, mild to moderate left atrial enlargement, small pericardial effusion, moderate aortic stenosis.  Lexiscan Myoview on 12/16/2021 showed no ischemia or infarct, EF 58%.  He was seen in the clinic 12/29/2021 by Dr. Gaynelle Arabian.  Since his discharge from the hospital he had been doing better.  He had not had \\any  recent chest pain.  He had been short of breath but it was improved with his inhaler use.  He reports that his weight had increased about 10 pounds according to his scale.  He did have an episode of epistaxis the night before but otherwise did not have any bleeding issues.  Today, he states that he would like to get set up with a CPAP machine and needs "the hold bit."  He states that he is always hungry and his stools are sometimes hard and sometimes loose.  He has an incisional hernia that he is worried about.  He has not had any chest pain or shortness of breath.  He loves fishing so does that for activity.  Otherwise, he does not get much exercise.  He has not had any further episodes of epistaxis.  His blood pressure is well controlled today.  His biggest concern is getting set up with his CPAP machine.  Reports no shortness of breath nor dyspnea on exertion. Reports no chest  pain, pressure, or tightness. No edema, orthopnea, PND. Reports no palpitations.     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 Left ventricular ejection fraction, by estimation, is 55 to 60%. The  left ventricle has normal function. The left ventricle has no regional  wall motion abnormalities. There is mild left ventricular hypertrophy of  the basal-septal segment. Left  ventricular diastolic parameters are indeterminate.   2. Right ventricular systolic function is low normal. The right  ventricular size is normal. There is normal pulmonary artery systolic  pressure.   3. Left atrial size was mild to moderately dilated.   4. A small pericardial effusion is present.   5.  The mitral valve is grossly normal. Trivial mitral valve  regurgitation. No evidence of mitral stenosis.   6. The aortic valve is calcified. There is moderate calcification of the  aortic valve. There is moderate thickening of the aortic valve. Aortic  valve regurgitation is trivial. Moderate aortic valve stenosis. Aortic  valve area, by VTI measures 1.23 cm.  Aortic valve mean gradient measures 22.4 mmHg. Aortic valve Vmax measures  3.15 m/s.   7. Aortic dilatation noted. There is borderline dilatation of the  ascending aorta, measuring 38 mm.   8. The  inferior vena cava is normal in size with greater than 50%  respiratory variability, suggesting right atrial pressure of 3 mmHg.    Lexiscan Myoview 12/16/2021 IMPRESSION: 1. Probable inferior wall diaphragmatic attenuation. No reversible ischemia or infarction.   2. Normal left ventricular wall motion.   3. Left ventricular ejection fraction 58%   4. Non invasive risk stratification*: Low  EKG:  EKG is not ordered today.   Recent Labs: 12/14/2021: ALT 17; TSH 2.122 12/29/2021: BNP 130.2; BUN 25; Creatinine, Ser 1.41; Hemoglobin 15.2; Magnesium 1.9; Platelets 232; Potassium 4.9; Sodium 141  Recent Lipid Panel    Component Value Date/Time   CHOL 150 12/15/2021 0409   TRIG 170 (H) 12/15/2021 0409   HDL 54 12/15/2021 0409   CHOLHDL 2.8 12/15/2021 0409   VLDL 34 12/15/2021 0409   LDLCALC 62 12/15/2021 0409    Home Medications   Current Meds  Medication Sig   amiodarone (PACERONE) 200 MG tablet Take 1 tablet (200 mg total) by mouth daily.   apixaban (ELIQUIS) 5 MG TABS tablet Take 1 tablet (5 mg total) by mouth 2 (two) times daily.   aspirin EC 81 MG tablet Take 81 mg by mouth daily. Swallow whole.   carvedilol (COREG) 6.25 MG tablet Take 1 tablet (6.25 mg total) by mouth 2 (two) times daily.   fluticasone (FLONASE) 50 MCG/ACT nasal spray Place 2 sprays into both nostrils 3 (three) times daily as needed for allergies or rhinitis.   furosemide (LASIX) 40 MG tablet Take 1 tablet (40 mg total) by mouth every other day.   isosorbide mononitrate (IMDUR) 30 MG 24 hr tablet Take 1 tablet (30 mg total) by mouth daily.   montelukast (SINGULAIR) 10 MG tablet Take 10 mg by mouth daily as needed (for allergies).   nitroGLYCERIN (NITROSTAT) 0.4 MG SL tablet Place 0.4 mg under the tongue every 5 (five) minutes as needed for chest pain.   pantoprazole (PROTONIX) 40 MG tablet Take 1 tablet (40 mg total) by mouth daily before breakfast.   rosuvastatin (CRESTOR) 40 MG tablet Take 1 tablet (40 mg  total) by mouth daily.   triamcinolone cream (KENALOG) 0.1 % Apply 1 application. topically 2 (two) times daily as needed (to affected areas of the throat and neck).   TYLENOL 8 HOUR ARTHRITIS PAIN 650 MG CR tablet Take 650-1,300 mg by mouth every 8 (eight) hours as needed for pain.   venlafaxine XR (EFFEXOR-XR) 150 MG 24 hr capsule Take 1 capsule (150 mg total) by mouth daily with breakfast.     Review of Systems      All other systems reviewed and are otherwise negative except as noted above.  Physical Exam    VS:  BP 140/80   Pulse (!) 59   Ht 5\' 5"  (1.651 m)   Wt 212 lb (96.2 kg)   SpO2 93%   BMI 35.28 kg/m  , BMI Body mass index is  35.28 kg/m.  Wt Readings from Last 3 Encounters:  02/17/22 212 lb (96.2 kg)  01/27/22 220 lb (99.8 kg)  12/29/21 217 lb 3.2 oz (98.5 kg)     GEN: Well nourished, well developed, in no acute distress. HEENT: normal. Neck: Supple, no JVD, carotid bruits, or masses. Cardiac: RRR, no murmurs, rubs, or gallops. No clubbing, cyanosis, edema.  Radials/PT 2+ and equal bilaterally.  Respiratory:  Respirations regular and unlabored, clear to auscultation bilaterally. GI: Soft, nontender, nondistended. MS: No deformity or atrophy. Skin: Warm and dry, no rash. Neuro:  Strength and sensation are intact. Psych: Normal affect.  Assessment & Plan    CAD status post CABG -no further chest pain -continue GDMT: Eliquis 5 mg twice daily, ASA 81 mg daily, Coreg 6.25 mg twice daily, Lasix 40 mg every other day, Imdur 30 mg daily, Crestor 40 mg daily as needed nitroglycerin  Moderate aortic stenosis -currently no symptoms -He is worried about this but not severe enough to consider TAVR -continue to monitor  Chronic diastolic heart failure -Euvolemic on exam today  4. Atrial fibrillation/Symptomatic bradycardia status post PPM -infection and bleeding complications following procedure -asymptomatic today -He has appointment with Dr. Royann Shivers next  week  Hypertension -moderate control today -please monitor your BP at home  CKD stage IIIa -1.41 on recent labs -avoid nephrotoxic drugs  OSA -Getting fitted for CPAP  Hyperlipidemia -Recent lipid panel 5/23 which showed total cholesterol 150, LDL 62, HDL 54, triglycerides 170 -Continue current medication regimen    Disposition: Follow up 2 months with Little Ishikawa, MD or APP.  Signed, Sharlene Dory, PA-C 02/17/2022, 6:08 PM [facility]

## 2022-02-17 NOTE — Telephone Encounter (Signed)
   Johnny [name] was seen in the clinic today and was inappropriate with staff members. Comments were made by the patient that made staff feel uncomfortable. It was reported and will be address by administration.

## 2022-02-21 ENCOUNTER — Encounter: Payer: Self-pay | Admitting: Cardiovascular Disease

## 2022-02-21 ENCOUNTER — Ambulatory Visit (INDEPENDENT_AMBULATORY_CARE_PROVIDER_SITE_OTHER): Payer: Medicare Other | Admitting: Cardiovascular Disease

## 2022-02-21 VITALS — BP 146/104 | HR 61 | Ht 65.0 in | Wt 213.6 lb

## 2022-02-21 DIAGNOSIS — N183 Chronic kidney disease, stage 3 unspecified: Secondary | ICD-10-CM

## 2022-02-21 DIAGNOSIS — Z950 Presence of cardiac pacemaker: Secondary | ICD-10-CM | POA: Diagnosis not present

## 2022-02-21 DIAGNOSIS — Z5181 Encounter for therapeutic drug level monitoring: Secondary | ICD-10-CM | POA: Diagnosis not present

## 2022-02-21 DIAGNOSIS — I2510 Atherosclerotic heart disease of native coronary artery without angina pectoris: Secondary | ICD-10-CM

## 2022-02-21 DIAGNOSIS — I480 Paroxysmal atrial fibrillation: Secondary | ICD-10-CM

## 2022-02-21 DIAGNOSIS — I495 Sick sinus syndrome: Secondary | ICD-10-CM | POA: Diagnosis not present

## 2022-02-21 DIAGNOSIS — D6869 Other thrombophilia: Secondary | ICD-10-CM

## 2022-02-21 DIAGNOSIS — G4733 Obstructive sleep apnea (adult) (pediatric): Secondary | ICD-10-CM

## 2022-02-21 DIAGNOSIS — N1832 Chronic kidney disease, stage 3b: Secondary | ICD-10-CM

## 2022-02-21 DIAGNOSIS — E1122 Type 2 diabetes mellitus with diabetic chronic kidney disease: Secondary | ICD-10-CM

## 2022-02-21 DIAGNOSIS — I5032 Chronic diastolic (congestive) heart failure: Secondary | ICD-10-CM

## 2022-02-21 DIAGNOSIS — Z79899 Other long term (current) drug therapy: Secondary | ICD-10-CM

## 2022-02-21 DIAGNOSIS — E7800 Pure hypercholesterolemia, unspecified: Secondary | ICD-10-CM

## 2022-02-21 NOTE — Progress Notes (Signed)
Cardiology Office Note:    Date:  02/23/2022   ID:  Johnny [name], DOB [birth year], MRN [id number]  PCP:  Sandrea Hughs, NP   [place] Providers Cardiologist:  Donato Heinz, MD     Referring MD: Donato Heinz*   Chief Complaint  Patient presents with   Pacemaker Check    History of Present Illness:    Johnny [name] is a 76 y.o. male with a hx of tachycardia-bradycardia syndrome and paroxysmal atrial fibrillation who received a dual-chamber permanent pacemaker when he was living in [facility].  He has relocated to [facility] recently and has established general cardiology follow-up with Dr. Gardiner Rhyme.  He is originally from [place].  In addition to the arrhythmia problems described above he has a history of coronary disease with previous bypass surgery and percutaneous intervention, aortic valve stenosis, chronic diastolic heart failure, obstructive sleep apnea, CKD stage III, type 2 diabetes mellitus, hyperlipidemia and essential hypertension.  He used to use CPAP with good results, but after leaving [place] lost his device (reports that his girlfriend "stole everything").  He has had serious problems with hypersomnolence since then and has not yet been able to get CPAP.  Cardiac catheterization performed last November showed that his aortic valve stenosis is not yet critical (but rather moderate) and plans for TAVR placed on hold.  He has normal left ventricular systolic function.  He is on chronic antiarrhythmic therapy with amiodarone and takes apixaban for stroke prevention.  He is also taking carvedilol and long-acting nitrates and is on a high dose of rosuvastatin.  The patient specifically denies any chest pain at rest exertion, dyspnea at rest or with exertion, orthopnea, paroxysmal nocturnal dyspnea, syncope, palpitations, focal neurological deficits, intermittent claudication, lower extremity edema, unexplained weight  gain, cough, hemoptysis or wheezing.   Pacemaker interrogation shows normal device function.  It is a Patent attorney MRI conditional device implanted in September 2022.  He reports that there were problems with superficial infection at the pacemaker site that were treated with antibiotics.  He has a very large scar in the left subclavian area with fat atrophy, but there is no evidence of erythema, swelling or tenderness or pocket of fluctuance.  Estimated generator longevity is 13 years.  All lead parameters were manually checked today and are excellent.  He has 43% atrial pacing and never requires ventricular pacing.  The device has not recorded atrial fibrillation since implantation last September.  Underlying rhythm is severe sinus bradycardia at about 40 bpm with 1: 1 AV conduction.   Past Medical History:  Diagnosis Date   Atrial fibrillation ([place])    Deafness in left ear    Hypertension    Symptomatic bradycardia 04/2021   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[place], [place]    Past Surgical History:  Procedure Laterality Date   BACK SURGERY  1975   Lower Back Surgery, Dr.Seltzer   BACK SURGERY  1985   Dr.Seltzer   SHOULDER SURGERY Left 1965   Dr.Seltzer   TONSILLECTOMY  1950   Dr.Perellie    Current Medications: Current Meds  Medication Sig   amiodarone (PACERONE) 200 MG tablet Take 1 tablet (200 mg total) by mouth daily.   apixaban (ELIQUIS) 5 MG TABS tablet Take 1 tablet (5 mg total) by mouth 2 (two) times daily.   aspirin EC 81 MG tablet Take 81 mg by mouth daily. Swallow whole.   carvedilol (COREG) 6.25 MG tablet Take 1 tablet (6.25 mg  total) by mouth 2 (two) times daily.   fluticasone (FLONASE) 50 MCG/ACT nasal spray Place 2 sprays into both nostrils 3 (three) times daily as needed for allergies or rhinitis.   furosemide (LASIX) 40 MG tablet Take 1 tablet (40 mg total) by mouth every other day.   isosorbide mononitrate (IMDUR) 30 MG 24 hr tablet Take 1 tablet (30 mg total) by mouth  daily.   montelukast (SINGULAIR) 10 MG tablet Take 10 mg by mouth daily as needed (for allergies).   pantoprazole (PROTONIX) 40 MG tablet Take 1 tablet (40 mg total) by mouth daily before breakfast.   rosuvastatin (CRESTOR) 40 MG tablet Take 1 tablet (40 mg total) by mouth daily.   venlafaxine XR (EFFEXOR-XR) 150 MG 24 hr capsule Take 1 capsule (150 mg total) by mouth daily with breakfast.     Allergies:   Metformin and related   Social History   Socioeconomic History   Marital status: Widowed    Spouse name: Not on file   Number of children: Not on file   Years of education: Not on file   Highest education level: Not on file  Occupational History   Not on file  Tobacco Use   Smoking status: Former    Packs/day: 1.00    Years: 10.00    Total pack years: 10.00    Types: Cigarettes   Smokeless tobacco: Never  Substance and Sexual Activity   Alcohol use: Never   Drug use: Never   Sexual activity: Not on file  Other Topics Concern   Not on file  Social History Narrative   Tobacco use, amount per day now: None   Past tobacco use, amount per day: 1 pack   How many years did you use tobacco: 10 years.   Alcohol use (drinks per week): None   Diet:   Do you drink/eat things with caffeine: Yes   Marital status:  Widow                                What year were you married? 1972   Do you live in a house, apartment, assisted living, condo, trailer, etc.? House   Is it one or more stories? One   How many persons live in your home? One   Do you have pets in your home?( please list) No   Highest Level of education completed? 12th grade.   Current or past profession: Psychologist, occupational, Investment banker, operational, Clinical biochemist.    Do you exercise?   No                               Type and how often?   Do you have a living will? Yes   Do you have a DNR form?     No                              If not, do you want to discuss one?   Do you have signed POA/HPOA forms?  Yes                      If so, please  bring to you appointment      Do you have any difficulty bathing or dressing yourself? No   Do you have any difficulty preparing food or eating? No  Do you have any difficulty managing your medications? No   Do you have any difficulty managing your finances? Yes   Do you have any difficulty affording your medications? No   Social Determinants of Radio broadcast assistant Strain: Not on file  Food Insecurity: Not on file  Transportation Needs: Not on file  Physical Activity: Not on file  Stress: Not on file  Social Connections: Not on file     Family History: The patient's family history includes Cancer in his father; Stroke in his brother and sister.  ROS:   Please see the history of present illness.     All other systems reviewed and are negative.  EKGs/Labs/Other Studies Reviewed:    The following studies were reviewed today: In office pacemaker check, performed today  Echocardiogram 12/15/2021   1. Left ventricular ejection fraction, by estimation, is 55 to 60%. The  left ventricle has normal function. The left ventricle has no regional  wall motion abnormalities. There is mild left ventricular hypertrophy of  the basal-septal segment. Left  ventricular diastolic parameters are indeterminate.   2. Right ventricular systolic function is low normal. The right  ventricular size is normal. There is normal pulmonary artery systolic  pressure.   3. Left atrial size was mild to moderately dilated.   4. A small pericardial effusion is present.   5. The mitral valve is grossly normal. Trivial mitral valve  regurgitation. No evidence of mitral stenosis.   6. The aortic valve is calcified. There is moderate calcification of the  aortic valve. There is moderate thickening of the aortic valve. Aortic  valve regurgitation is trivial. Moderate aortic valve stenosis. Aortic  valve area, by VTI measures 1.23 cm.  Aortic valve mean gradient measures 22.4 mmHg. Aortic valve Vmax  measures  3.15 m/s.   7. Aortic dilatation noted. There is borderline dilatation of the  ascending aorta, measuring 38 mm.   8. The inferior vena cava is normal in size with greater than 50%  respiratory variability, suggesting right atrial pressure of 3 mmHg.   Comparison(s): No prior Echocardiogram.   Conclusion(s)/Recommendation(s): Otherwise normal echocardiogram, with  minor abnormalities described in the report. Technically challenging  images, but no severe wall motion abnormalities seen. Moderate aortic  stenosis.  12/16/2021 nuclear perfusion study IMPRESSION: 1. Probable inferior wall diaphragmatic attenuation. No reversible ischemia or infarction.   2. Normal left ventricular wall motion.   3. Left ventricular ejection fraction 58%   4. Non invasive risk stratification*: Low  EKG:  EKG is not ordered today.  The intracardiac electrogram shows atrial paced, ventricular sensed rhythm.  The ekg ordered 12/29/2021 is personally reviewed and demonstrates atrial paced, ventricular sensed rhythm with nonspecific ST segment depression most obviously noted in the lateral leads  Recent Labs: 12/14/2021: ALT 17; TSH 2.122 12/29/2021: BNP 130.2; BUN 25; Creatinine, Ser 1.41; Hemoglobin 15.2; Magnesium 1.9; Platelets 232; Potassium 4.9; Sodium 141  Recent Lipid Panel    Component Value Date/Time   CHOL 150 12/15/2021 0409   TRIG 170 (H) 12/15/2021 0409   HDL 54 12/15/2021 0409   CHOLHDL 2.8 12/15/2021 0409   VLDL 34 12/15/2021 0409   LDLCALC 62 12/15/2021 0409     Risk Assessment/Calculations:    CHA2DS2-VASc Score = 6   This indicates a 9.7% annual risk of stroke. The patient's score is based upon: CHF History: 1 HTN History: 1 Diabetes History: 1 Stroke History: 0 Vascular Disease History: 1 Age Score: 2 Gender Score: 0  Physical Exam:    VS:  BP (!) 146/104 (BP Location: Left Arm, Patient Position: Sitting, Cuff Size: Normal)   Pulse 61   Ht 5\' 5"   (1.651 m)   Wt 213 lb 9.6 oz (96.9 kg)   SpO2 97%   BMI 35.54 kg/m     Wt Readings from Last 3 Encounters:  02/21/22 213 lb 9.6 oz (96.9 kg)  02/17/22 212 lb (96.2 kg)  01/27/22 220 lb (99.8 kg)     GEN: Appears very comfortable, well nourished, well developed in no acute distress HEENT: Normal NECK: No JVD; bilateral carotid bruits LYMPHATICS: No lymphadenopathy CARDIAC: RRR, early-mid peaking 2/6 aortic ejection murmur heard most distinctly at the right upper sternal border and radiating to both carotids no diastolic murmurs, rubs, gallops.  Left subclavian area shows a very broad scar and fat atrophy just above an implanted pacemaker, without any signs of active infection RESPIRATORY:  Clear to auscultation without rales, wheezing or rhonchi  ABDOMEN: Soft, non-tender, non-distended MUSCULOSKELETAL:  No edema; No deformity  SKIN: Warm and dry NEUROLOGIC:  Alert and oriented x 3 PSYCHIATRIC:  Normal affect   ASSESSMENT:    1. Paroxysmal atrial fibrillation (HCC)   2. SSS (sick sinus syndrome) ([place])   3. Pacemaker   4. Encounter for monitoring amiodarone therapy   5. Acquired thrombophilia ([place])   6. Coronary artery disease involving native coronary artery of native heart without angina pectoris   7. Chronic diastolic HF (heart failure) ([place])   8. Stage 3b chronic kidney disease ([place])   9. Controlled type 2 diabetes mellitus with stage 3 chronic kidney disease, without long-term current use of insulin ([place])   10. Hypercholesterolemia   11. OSA (obstructive sleep apnea)    PLAN:    In order of problems listed above:  Afib: None has been detected since pacemaker implantation in September 2022 on antiarrhythmic therapy with amiodarone. SSS: Due to underlying sinus node dysfunction and/or treatment with amiodarone and beta-blockers he has severe sinus bradycardia at about 40 bpm.  He is not pacemaker dependent.  The heart rate histogram shows adequate pacemaker sensor  settings. Pacemaker: Normal device function.  We will transition his remote downloads to our device clinic.  Downloads every 3 months. Amiodarone: No evidence of toxicity from this drug to date.  Important to avoid sun exposure, get a yearly eye exam, promptly report any unexplained respiratory symptoms.  Normal liver function tests and TSH on 12/14/2021, to be repeated every 6 months Anticoagulation: No bleeding problems.  Despite renal dysfunction he still qualifies for full dose Eliquis based on age and weight. CAD s/p CABG: Currently without angina pectoris.  Low risk nuclear stress test 12/16/2021. CHF: There appears to be euvolemic, NYHA functional class I.  May benefit from [place].  As far as I can tell, dry weight seems to be somewhere in the 210-215 range. CKD 3B: Estimated GFR is right around 45.  Renal parameters seem to be stable DM: There is no recent hemoglobin A1c available for review.  Consider treatment with SGLT2 inhibitor for improved vascular prognosis in this patient with known coronary disease and diastolic heart failure as well as advanced chronic kidney disease. HLP: On maximum dose rosuvastatin.  Most recent LDL cholesterol was excellent at 62.  Has very mild residual hypertriglyceridemia that does not require additional pharmacological therapy.  HDL is good. OSA: Had a repeat sleep study on 01/27/2022 showing severe sleep apnea with AHI over 60 events per hour.  Still waiting to get his BiPAP equipment.  He has substantial daytime hypersomnolence.           Medication Adjustments/Labs and Tests Ordered: Current medicines are reviewed at length with the patient today.  Concerns regarding medicines are outlined above.  Orders Placed This Encounter  Procedures   Pacemaker external - parameters   No orders of the defined types were placed in this encounter.   Patient Instructions  Medication Instructions:  No changes *If you need a refill on your  cardiac medications before your next appointment, please call your pharmacy*   Lab Work: None ordered If you have labs (blood work) drawn today and your tests are completely normal, you will receive your results only by: MyChart Message (if you have MyChart) OR A paper copy in the mail If you have any lab test that is abnormal or we need to change your treatment, we will call you to review the results.   Testing/Procedures: None ordered   Follow-Up: At [facility], you and your health needs are our priority.  As part of our continuing mission to provide you with exceptional heart care, we have created designated Provider Care Teams.  These Care Teams include your primary Cardiologist (physician) and Advanced Practice Providers (APPs -  Physician Assistants and Nurse Practitioners) who all work together to provide you with the care you need, when you need it.  We recommend signing up for the patient portal called "MyChart".  Sign up information is provided on this After Visit Summary.  MyChart is used to connect with patients for Virtual Visits (Telemedicine).  Patients are able to view lab/test results, encounter notes, upcoming appointments, etc.  Non-urgent messages can be sent to your provider as well.   To learn more about what you can do with MyChart, go to [email or web address].    Your next appointment:   12 month(s)  The format for your next appointment:   In Person  Provider:   Dr. Royann Shivers  Important Information About Sugar         Signed, Thurmon Fair, MD  02/23/2022 4:39 PM    [facility]

## 2022-02-21 NOTE — Patient Instructions (Signed)
Medication Instructions:  No changes *If you need a refill on your cardiac medications before your next appointment, please call your pharmacy*   Lab Work: None ordered If you have labs (blood work) drawn today and your tests are completely normal, you will receive your results only by: MyChart Message (if you have MyChart) OR A paper copy in the mail If you have any lab test that is abnormal or we need to change your treatment, we will call you to review the results.   Testing/Procedures: None ordered   Follow-Up: At [facility], you and your health needs are our priority.  As part of our continuing mission to provide you with exceptional heart care, we have created designated Provider Care Teams.  These Care Teams include your primary Cardiologist (physician) and Advanced Practice Providers (APPs -  Physician Assistants and Nurse Practitioners) who all work together to provide you with the care you need, when you need it.  We recommend signing up for the patient portal called "MyChart".  Sign up information is provided on this After Visit Summary.  MyChart is used to connect with patients for Virtual Visits (Telemedicine).  Patients are able to view lab/test results, encounter notes, upcoming appointments, etc.  Non-urgent messages can be sent to your provider as well.   To learn more about what you can do with MyChart, go to [email or web address].    Your next appointment:   12 month(s)  The format for your next appointment:   In Person  Provider:   Dr. Croitoru  Important Information About Sugar       

## 2022-02-23 ENCOUNTER — Encounter: Payer: Self-pay | Admitting: Cardiovascular Disease

## 2022-03-09 ENCOUNTER — Telehealth: Payer: Self-pay

## 2022-03-09 NOTE — Telephone Encounter (Signed)
Need to consult with Kidney specialist if okay to take vitamin D supplement since calcium levels were slightly high.

## 2022-03-21 ENCOUNTER — Ambulatory Visit: Payer: Medicare Other | Admitting: Physician Assistant

## 2022-03-25 ENCOUNTER — Other Ambulatory Visit: Payer: Self-pay | Admitting: *Deleted

## 2022-03-25 ENCOUNTER — Telehealth: Payer: Self-pay | Admitting: Cardiology

## 2022-03-25 MED ORDER — ROSUVASTATIN CALCIUM 40 MG PO TABS: 40.0000 mg | ORAL_TABLET | Freq: Every day | ORAL | 1 refills | Status: DC

## 2022-03-25 MED ORDER — AMIODARONE HCL 200 MG PO TABS: 200.0000 mg | ORAL_TABLET | Freq: Every day | ORAL | 1 refills | Status: DC

## 2022-03-25 MED ORDER — PANTOPRAZOLE SODIUM 40 MG PO TBEC: 40.0000 mg | DELAYED_RELEASE_TABLET | Freq: Every day | ORAL | 1 refills | Status: DC

## 2022-03-25 MED ORDER — APIXABAN 5 MG PO TABS: 5.0000 mg | ORAL_TABLET | Freq: Two times a day (BID) | ORAL | 1 refills | Status: DC

## 2022-03-25 MED ORDER — FUROSEMIDE 40 MG PO TABS: 40.0000 mg | ORAL_TABLET | ORAL | 3 refills | Status: DC

## 2022-03-25 MED ORDER — VENLAFAXINE HCL ER 150 MG PO CP24
150.0000 mg | ORAL_CAPSULE | Freq: Every day | ORAL | 1 refills | Status: DC
Start: 2022-03-25 — End: 2022-05-05

## 2022-03-25 MED ORDER — CARVEDILOL 6.25 MG PO TABS: 6.2500 mg | ORAL_TABLET | Freq: Two times a day (BID) | ORAL | 1 refills | Status: DC

## 2022-03-25 MED ORDER — ISOSORBIDE MONONITRATE ER 30 MG PO TB24
30.0000 mg | ORAL_TABLET | Freq: Every day | ORAL | 1 refills | Status: DC
Start: 2022-03-25 — End: 2022-05-05

## 2022-03-25 NOTE — Telephone Encounter (Signed)
Attempt to return call-unable to leave VM.

## 2022-03-25 NOTE — Telephone Encounter (Signed)
Pt is requesting a call back to discuss getting a prescription for oxygen. States he is having trouble staying awake and feels this may help.

## 2022-03-25 NOTE — Telephone Encounter (Signed)
Patient requested all refills to go to [facility].   Pended Rx's and sent to [facility] for approval due to HIGH ALERT Warning.

## 2022-03-28 ENCOUNTER — Ambulatory Visit (HOSPITAL_BASED_OUTPATIENT_CLINIC_OR_DEPARTMENT_OTHER): Payer: Medicare Other | Admitting: Cardiology

## 2022-03-30 ENCOUNTER — Ambulatory Visit (INDEPENDENT_AMBULATORY_CARE_PROVIDER_SITE_OTHER): Payer: Medicare Other | Admitting: Family

## 2022-03-30 ENCOUNTER — Encounter: Payer: Self-pay | Admitting: Family

## 2022-03-30 VITALS — BP 136/70 | HR 60 | Temp 96.8°F | Resp 18 | Ht 65.0 in | Wt 214.8 lb

## 2022-03-30 DIAGNOSIS — R21 Rash and other nonspecific skin eruption: Secondary | ICD-10-CM | POA: Diagnosis not present

## 2022-03-30 MED ORDER — LORATADINE 10 MG PO TABS: 10.0000 mg | ORAL_TABLET | Freq: Every day | ORAL | 0 refills | Status: DC

## 2022-03-30 MED ORDER — PREDNISONE 20 MG PO TABS: ORAL_TABLET | ORAL | 0 refills | Status: AC

## 2022-03-30 NOTE — Progress Notes (Signed)
Provider: Richarda Blade FNP-C  Delainy Mcelhiney, Donalee Citrin, NP  Patient Care Team: Elisabel Hanover, Donalee Citrin, NP as PCP - General (Family Medicine) Little Ishikawa, MD as PCP - Cardiology (Cardiology)  Extended Emergency Contact Information Primary Emergency Contact: Posceo,Deborah Address: [address]          [place], [place] [address] [place] of [place] Home Phone: [phone] Mobile Phone: [phone] Relation: Sister Preferred language: English Secondary Emergency Contact: Upham,Pattie Mobile Phone: [phone] Relation: Other  Code Status:  Full Code  Goals of care: Advanced Directive information    03/30/2022    9:20 AM  Advanced Directives  Does Patient Have a Medical Advance Directive? No  Would patient like information on creating a medical advance directive? No - Patient declined     Chief Complaint  Patient presents with   Acute Visit    Patient has concerns of insect bites. Patient states he tends to see hives when he gets out of car.     HPI:  Pt is a 76 y.o. male seen today for an acute visit for evaluation of insects bites.states his living place is invested with fleas.states land lord has a dog which could be having fleas.Has itchy rash all over the body.denies any fever,chills, change of lotion or laundry soap. Has spread the house and the car. Has dropped the dog to the treatment center to be checked.    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   apixaban (ELIQUIS) 5 MG TABS tablet Take 1 tablet (5 mg total) by mouth 2 (two) times daily.   aspirin EC 81 MG tablet Take 81 mg by mouth daily. Swallow whole.   carvedilol (COREG) 6.25 MG tablet Take 1 tablet (6.25 mg total) by mouth 2 (two) times daily.   fluticasone (FLONASE) 50 MCG/ACT nasal spray Place 2 sprays into both nostrils 3 (three) times daily as needed for allergies or rhinitis.   furosemide (LASIX) 40 MG tablet Take 1 tablet (40 mg total) by mouth every other day.   isosorbide mononitrate (IMDUR) 30 MG 24 hr tablet Take 1 tablet (30 mg total) by mouth daily.   montelukast (SINGULAIR) 10 MG tablet Take 10 mg by mouth daily as needed (for allergies).   nitroGLYCERIN (NITROSTAT) 0.4 MG SL tablet Place 0.4 mg under the tongue every 5 (five) minutes as needed for chest pain.   pantoprazole (PROTONIX) 40 MG tablet Take 1 tablet (40 mg total) by mouth daily before breakfast.   rosuvastatin (CRESTOR) 40 MG tablet Take 1 tablet (40 mg total) by mouth daily.   TYLENOL 8 HOUR ARTHRITIS PAIN 650 MG CR tablet Take 650-1,300 mg by mouth every 8 (eight) hours as needed for pain.   venlafaxine  XR (EFFEXOR-XR) 150 MG 24 hr capsule Take 1 capsule (150 mg total) by mouth daily with breakfast.   [DISCONTINUED] triamcinolone cream (KENALOG) 0.1 % Apply 1 application. topically 2 (two) times daily as needed (to affected areas of the throat and neck). (Patient not taking: Reported on 02/21/2022)   No facility-administered encounter medications on file as of 03/30/2022.    Review of Systems  Constitutional:  Negative for chills, fatigue and fever.  Respiratory:  Negative for cough, chest tightness, shortness of breath and wheezing.   Gastrointestinal:  Negative for abdominal distention, abdominal pain, diarrhea, nausea and vomiting.  Skin:  Positive  for rash. Negative for color change and pallor.  Neurological:  Negative for dizziness, light-headedness and headaches.    Immunization History  Administered Date(s) Administered   Fluad Quad(high Dose 65+) 05/06/2020   Influenza Split 09/15/2009, 05/03/2013   Influenza, High Dose Seasonal PF 06/05/2014, 04/20/2015, 06/08/2016, 04/22/2017, 04/15/2021   Influenza-Unspecified 07/14/2004, 05/07/2007, 05/02/2008, 05/01/2018, 04/29/2019   PPD Test 06/08/2021   Pneumococcal Conjugate-13 06/05/2014   Pneumococcal Polysaccharide-23 08/13/2012   Pertinent  Health Maintenance Due  Topic Date Due   HEMOGLOBIN A1C  Never done   FOOT EXAM  Never done   OPHTHALMOLOGY EXAM  Never done   URINE MICROALBUMIN  Never done   COLONOSCOPY (Pts 45-20yrs Insurance coverage will need to be confirmed)  Never done   INFLUENZA VACCINE  03/01/2022      12/15/2021   10:00 PM 12/16/2021   10:00 AM 12/16/2021    7:30 PM 12/17/2021    7:20 AM 03/30/2022    9:19 AM  Fall Risk  Falls in the past year?     0  Was there an injury with Fall?     0  Fall Risk Category Calculator     0  Fall Risk Category     Low  Patient Fall Risk Level Moderate fall risk Moderate fall risk Moderate fall risk Moderate fall risk Low fall risk  Patient at Risk for Falls Due to     No Fall Risks  Fall risk Follow up     Falls evaluation completed   Functional Status Survey:    Vitals:   03/30/22 0910  BP: 136/70  Pulse: 60  Resp: 18  Temp: (!) 96.8 F (36 C)  SpO2: 95%  Weight: 214 lb 12.8 oz (97.4 kg)  Height: 5\' 5"  (1.651 m)   Body mass index is 35.74 kg/m. Physical Exam Vitals reviewed.  Constitutional:      General: He is not in acute distress.    Appearance: Normal appearance. He is obese. He is not ill-appearing or diaphoretic.  Skin:    General: Skin is warm and dry.     Coloration: Skin is not pale.     Findings: Rash present. No bruising, erythema or lesion.     Comments: Generalized red raised rash worst on  trunk area and few spots on the lower extremities.  Neurological:     Mental Status: He is alert and oriented to person, place, and time.     Motor: No weakness.     Gait: Gait normal.  Psychiatric:        Mood and Affect: Mood normal.        Speech: Speech normal.        Behavior: Behavior normal.     Labs reviewed: Recent Labs    12/16/21 0209 12/17/21 0742 12/29/21 0944  NA 139 139 141  K 4.7 4.4 4.9  CL 105 105 104  CO2 27 29 24   GLUCOSE 76 163* 97  BUN 21 21 25   CREATININE 1.57* 1.56* 1.41*  CALCIUM 10.3 10.2 10.3*  MG  --   --  1.9   Recent Labs    12/14/21 1353  AST 19  ALT 17  ALKPHOS 93  BILITOT 1.1  PROT 7.9  ALBUMIN 4.3   Recent Labs    12/15/21 0409 12/16/21 0209 12/29/21 0944  WBC 7.9 8.4 9.3  HGB 15.8 15.9 15.2  HCT 49.6 48.8 44.7  MCV 84.2 83.7 82  PLT 202 197 232   Lab Results  Component Value Date   TSH 2.122 12/14/2021   No results found for: "HGBA1C" Lab Results  Component Value Date   CHOL 150 12/15/2021   HDL 54 12/15/2021   LDLCALC 62 12/15/2021   TRIG 170 (H) 12/15/2021   CHOLHDL 2.8 12/15/2021    Significant Diagnostic Results in last 30 days:  No results found.  Assessment/Plan  Rash and nonspecific skin eruption Afebrile  Generalized red raised rash worst on trunk area and few spots on the lower extremities. - discussed starting on tapered prednisone side effects discussed  - Loratadine for itching  - predniSONE (DELTASONE) 20 MG tablet; Take 2 tablets (40 mg total) by mouth daily with breakfast for 1 day, THEN 1.5 tablets (30 mg total) daily with breakfast for 1 day, THEN 1 tablet (20 mg total) daily with breakfast for 1 day, THEN 0.5 tablets (10 mg total) daily with breakfast for 1 day.  Dispense: 5 tablet; Refill: 0 - loratadine (CLARITIN) 10 MG tablet; Take 1 tablet (10 mg total) by mouth daily for 14 days.  Dispense: 14 tablet; Refill: 0 - Notify provider if symptoms worsen or fail to improve   Family/ staff  Communication: Reviewed plan of care with patient verbalized understanding   Labs/tests ordered: None   Next Appointment: Return if symptoms worsen or fail to improve.   12/17/2021, NP

## 2022-04-01 NOTE — Telephone Encounter (Signed)
Attempt to return call-unable to leave VM.  Patient has an appointment 9/5

## 2022-04-04 NOTE — Progress Notes (Deleted)
Cardiology Office Note:    Date:  04/04/2022   ID:  Karlis Cregg, DOB [birth year], MRN [id number]  PCP:  Caesar Bookman, NP  Cardiologist:  Little Ishikawa, MD  Electrophysiologist:  None   Referring MD: Caesar Bookman, NP   No chief complaint on file.   History of Present Illness:    Johnny [name] is a 76 y.o. male with a hx of CAD status post CABG in 2015, moderate aortic stenosis, diastolic heart failure, OSA, atrial fibrillation, symptomatic bradycardia status post PPM who presents for hospital follow-up.  He was admitted 11/2021 with chest pain.  He had rmoved from [place] in 09/2021.  Echocardiogram 12/15/2021 showed EF 55 to 60%, low normal RV function, mild to moderate left atrial enlargement, small pericardial effusion, moderate aortic stenosis.  Lexiscan Myoview on 12/16/2021 showed no ischemia or infarction, EF 58%.  Since last clinic visit,  ***SGLT2  discharge from the hospital, he reports that he is doing better.  He states he has not had any recent chest pain.  Has been having shortness of breath, improves with inhaler use.  Denies any lightheadedness, syncope, or palpitations.  Reports weight has increased, up 10 pounds on home scale.  Had episode of epistaxis last night, otherwise denies any bleeding issues.   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     Allergies:   Acacia and Metformin and related   Social History   Socioeconomic History   Marital status: Widowed    Spouse name: Not on file   Number of children: Not on file   Years of education: Not on file   Highest education level: Not on file  Occupational History   Not on file  Tobacco Use   Smoking status: Former    Packs/day: 1.00    Years: 10.00    Total pack years: 10.00    Types: Cigarettes   Smokeless tobacco: Never  Substance and Sexual Activity   Alcohol use: Never   Drug use: Never   Sexual activity: Not on file  Other Topics Concern   Not on file  Social History Narrative   Tobacco use, amount per day now: None   Past tobacco use, amount per day: 1 pack   How many years did you use tobacco: 10 years.   Alcohol use (drinks per week): None   Diet:   Do you drink/eat things with caffeine: Yes   Marital status:  Widow                                What year were you married? 1972   Do you live in a house, apartment,  assisted living, condo, trailer, etc.? House   Is it one or more stories? One   How many persons live in your home? One   Do you have pets in your home?( please list) No   Highest Level of education completed? 12th grade.   Current or past profession: Psychologist, occupational, Investment banker, operational, Clinical biochemist.    Do you exercise?   No                               Type and how often?   Do you have a living will? Yes   Do you have a DNR form?     No                              If not, do you want to discuss one?   Do you have signed POA/HPOA forms?  Yes                      If so, please bring to you appointment      Do you have any difficulty bathing or dressing yourself? No   Do you have any difficulty preparing food or eating? No   Do you have any difficulty managing your medications? No   Do you have any difficulty managing your finances? Yes   Do you have any difficulty affording your medications? No   Social  Determinants of Corporate investment banker Strain: Not on file  Food Insecurity: Not on file  Transportation Needs: Not on file  Physical Activity: Not on file  Stress: Not on file  Social Connections: Not on file     Family History: The patient's family history includes Cancer in his father; Stroke in his brother and sister.  ROS:   Please see the history of present illness.     All other systems reviewed and are negative.  EKGs/Labs/Other Studies Reviewed:    The following studies were reviewed today:   EKG:   12/29/2021: Atrial paced rhythm, rate 60, diffuse less than 1 mm ST depressions  Recent Labs: 12/14/2021: ALT 17; TSH 2.122 12/29/2021: BNP 130.2; BUN 25; Creatinine, Ser 1.41; Hemoglobin 15.2; Magnesium 1.9; Platelets 232; Potassium 4.9; Sodium 141  Recent Lipid Panel    Component Value Date/Time   CHOL 150 12/15/2021 0409   TRIG 170 (H) 12/15/2021 0409   HDL 54 12/15/2021 0409   CHOLHDL 2.8 12/15/2021 0409   VLDL 34 12/15/2021 0409   LDLCALC 62 12/15/2021 0409    Physical Exam:    VS:  There were no vitals taken for this visit.    Wt Readings from Last 3 Encounters:  03/30/22 214 lb 12.8 oz (97.4 kg)  02/21/22 213 lb 9.6 oz (96.9 kg)  02/17/22 212 lb (96.2 kg)     GEN:  Well nourished, well developed in no acute distress HEENT: Normal NECK: No JVD; No carotid bruits LYMPHATICS: No lymphadenopathy CARDIAC: RRR, 2/6 systolic murmur RESPIRATORY:  Clear to auscultation without rales, wheezing or rhonchi  ABDOMEN: Soft, non-tender, non-distended MUSCULOSKELETAL:  No edema; No deformity  SKIN: Warm and dry NEUROLOGIC:  Alert and oriented x 3 PSYCHIATRIC:  Normal affect   ASSESSMENT:    No diagnosis found.  PLAN:    CAD: status post CABG.  Coronary angiography 06/07/21: "normal left main with severe mid-LAD disease and patent LIMA graft to LAD but with  slow flow in the distal graft with possible filling defect vs streaming; in either case, the distal  vessel is very small, too small for PCI. There were patent stents in second obtuse marginal branch. Distal circumflex was chronically occluded with right to left collaterals. Sequential SVG to two OM branches was chronically occluded. RCA contained a widely patent stent in proximal portion".  Echocardiogram 12/15/2021 showed EF 55 to 60%, low normal RV function, mild to moderate left atrial enlargement, small pericardial effusion, moderate aortic stenosis.  Lexiscan Myoview on 12/16/2021 showed no ischemia or infarction, EF 58%. -Continue aspirin, statin -Continue beta-blocker, will switch to carvedilol 6.25 mg twice daily for better BP control -Continue Imdur 30 mg daily.  Reports no recent chest pain   Aortic stenosis: Moderate on echocardiogram 12/15/2021.  Repeat echo in 1 year to monitor   Chronic diastolic heart failure: Lasix was held during admission 11/2021, he has appeared hypovolemic.  Discharged on p.o. Lasix 40 mg daily as needed if gains more than 3 pounds in 1 day or 5 pounds in 1 week.  He reports weight is up 10 pounds and started taking Lasix daily again.  Appears euvolemic on exam.  Will switch Lasix to 40 mg every other day.  Check BMET, magnesium, BNP   Atrial fibrillation: On amiodarone 200 mg daily, metoprolol 25 mg twice daily, Eliquis 5 mg twice daily.  In atrial paced rhythm.  Check CBC   Symptomatic bradycardia: Status post [employer] PPM.  Referred to Dr. Sallyanne Kuster for device follow-up   Hypertension: On metoprolol and Imdur as above.  BP elevated, will switch metoprolol to carvedilol 6.25 mg twice daily   CKD stage IIIa: Baseline creatinine appears 1.4-1.5.  Check BMET   OSA: Reports he has been off CPAP for months.  Repeat sleep study 01/27/2022 showed severe OSA, started on BiPAP   Hyperlipidemia: On rosuvastatin 40 mg daily.  LDL 62   RTC in 4 to 6 weeks     Medication Adjustments/Labs and Tests Ordered: Current medicines are reviewed at length with the  patient today.  Concerns regarding medicines are outlined above.  No orders of the defined types were placed in this encounter.  No orders of the defined types were placed in this encounter.   There are no Patient Instructions on file for this visit.   Signed, Donato Heinz, MD  04/04/2022 3:35 PM    [place]

## 2022-04-05 ENCOUNTER — Ambulatory Visit: Payer: Medicare Other | Attending: Cardiology | Admitting: Cardiology

## 2022-04-05 ENCOUNTER — Ambulatory Visit (HOSPITAL_BASED_OUTPATIENT_CLINIC_OR_DEPARTMENT_OTHER): Payer: Medicare Other | Attending: Cardiology | Admitting: Cardiology

## 2022-04-05 DIAGNOSIS — I5032 Chronic diastolic (congestive) heart failure: Secondary | ICD-10-CM | POA: Insufficient documentation

## 2022-04-05 DIAGNOSIS — G4719 Other hypersomnia: Secondary | ICD-10-CM | POA: Diagnosis not present

## 2022-04-05 DIAGNOSIS — I25118 Atherosclerotic heart disease of native coronary artery with other forms of angina pectoris: Secondary | ICD-10-CM | POA: Diagnosis not present

## 2022-04-05 DIAGNOSIS — G4736 Sleep related hypoventilation in conditions classified elsewhere: Secondary | ICD-10-CM | POA: Insufficient documentation

## 2022-04-05 DIAGNOSIS — G4733 Obstructive sleep apnea (adult) (pediatric): Secondary | ICD-10-CM | POA: Insufficient documentation

## 2022-04-11 ENCOUNTER — Telehealth: Payer: Self-pay | Admitting: Cardiology

## 2022-04-11 NOTE — Telephone Encounter (Signed)
Patient called to get sleep study results.

## 2022-04-11 NOTE — Procedures (Signed)
   Patient Name: Johnny [name], Johnny [name] Date: 04/05/2022 Gender: Male D.O.B: November 20, 1945 Age (years): 75 Referring Provider: Armanda Magic MD, ABSM Height (inches): 66 Interpreting Physician: Armanda Magic MD, ABSM Weight (lbs): 220 RPSGT: [place] BMI: 36 MRN: [id number] Neck Size: 20.50  CLINICAL INFORMATION The patient is referred for a BiPAP titration to treat sleep apnea.  SLEEP STUDY TECHNIQUE As per the AASM Manual for the Scoring of Sleep and Associated Events v2.3 (April 2016) with a hypopnea requiring 4% desaturations.  The channels recorded and monitored were frontal, central and occipital EEG, electrooculogram (EOG), submentalis EMG (chin), nasal and oral airflow, thoracic and abdominal wall motion, anterior tibialis EMG, snore microphone, electrocardiogram, and pulse oximetry. Bilevel positive airway pressure (BPAP) was initiated at the beginning of the study and titrated to treat sleep-disordered breathing.  MEDICATIONS Medications self-administered by patient taken the night of the study : N/A  RESPIRATORY PARAMETERS Optimal IPAP Pressure (cm): 25  AHI at Optimal Pressure (/hr) 0.5 Optimal EPAP Pressure (cm):21  Overall Minimal O2 (%):65.0  Minimal O2 at Optimal Pressure (%): 90.0  SLEEP ARCHITECTURE Start Time:7:56:49 AM  Stop Time:1:56:44 PM  Total Time (min):359.9  Total Sleep Time (min):318.5 Sleep Latency (min):4.4  Sleep Efficiency (%):88.5%  REM Latency (min):81.0  WASO (min):37.0 Stage N1 (%):17.6%  Stage N2 (%): 47.3%  Stage N3 (%):0.0%  Stage R (%):35.2 Supine (%):86.72  Arousal Index (/hr):27.9   CARDIAC DATA The 2 lead EKG demonstrated pacemaker generated. The mean heart rate was 60.7 beats per minute. Other EKG findings include: PVCs. LEG MOVEMENT DATA The total Periodic Limb Movements of Sleep (PLMS) were 0. The PLMS index was 0.0. A PLMS index of <15 is considered normal in adults.  IMPRESSIONS - An optimal PAP pressure could  not be selected for this patient due to ongoing respiratory events.  - Mild Central Sleep Apnea was noted during this titration (CAI = 7/h). - Severe oxygen desaturations were observed during this titration (min O2 = 65.0%). - The patient snored with loud snoring volume. - 2-lead EKG demonstrated: PVCs - Clinically significant periodic limb movements were not noted during this study. Arousals associated with PLMs were rare.  DIAGNOSIS - Obstructive Sleep Apnea (G47.33) - Nocturnal Hypoxemia  RECOMMENDATIONS - Recommend in lab BiPAP titration.  - Avoid alcohol, sedatives and other CNS depressants that may worsen sleep apnea and disrupt normal sleep architecture. - Sleep hygiene should be reviewed to assess factors that may improve sleep quality. - Weight management and regular exercise should be initiated or continued. - Return to Sleep Center for re-evaluation after 4 weeks of therapy  [Electronically signed] 04/11/2022 11:17 AM  Armanda Magic MD, ABSM Diplomate, American Board of Sleep Medicine

## 2022-04-12 ENCOUNTER — Telehealth: Payer: Self-pay | Admitting: *Deleted

## 2022-04-12 DIAGNOSIS — G4733 Obstructive sleep apnea (adult) (pediatric): Secondary | ICD-10-CM

## 2022-04-12 DIAGNOSIS — I5032 Chronic diastolic (congestive) heart failure: Secondary | ICD-10-CM

## 2022-04-12 NOTE — Telephone Encounter (Signed)
The patient has been notified of the result and verbalized understanding.  All questions (if any) were answered. Johnny [name], CMA 04/12/2022 6:28 PM     Precert Bipap titration 

## 2022-04-12 NOTE — Telephone Encounter (Signed)
-----   Message from Gaynelle Cage, [place] sent at 04/11/2022  1:15 PM EDT -----  ----- Message ----- From: Quintella Reichert, MD Sent: 04/11/2022  11:19 AM EDT To: [facility]  Unsuccessuf CPAP titration due to ongoing respiratory events.  Please send back to lab for BiPAP titration

## 2022-04-13 ENCOUNTER — Telehealth: Payer: Self-pay

## 2022-04-13 NOTE — Telephone Encounter (Signed)
Patient left voicemail on clinical intake stating that [facility] canceled the shipment for his medications. He needs his Furosemide. He says that his hands and feet are swollen. He also states that he is having intermittent chest pain. I called and spoke with Lissa Hoard at [facility] and she stated that the refills sent in on 8/25 were sent in too early. Furosemide was sent to patient 7/17 as a 100 day supply. Patient is not due for refill according to pharmacy until October. I called and spoke with patient and he states that he is out of Furosemide because he has been taking them every day instead of everyday.  Message routed to Richarda Blade, NP (High priority)

## 2022-04-13 NOTE — Telephone Encounter (Signed)
Need ED evaluation for chest pain or schedule appointment as soon as possible with Cardiology.May refill Furosemide until seen by cardiology.

## 2022-04-14 MED ORDER — FUROSEMIDE 40 MG PO TABS: 40.0000 mg | ORAL_TABLET | ORAL | 0 refills | Status: DC

## 2022-04-14 NOTE — Telephone Encounter (Signed)
I discussed with patient and he verbalized his understanding. Furosemide sent to pharmacy.

## 2022-04-19 ENCOUNTER — Telehealth: Payer: Self-pay | Admitting: *Deleted

## 2022-04-19 NOTE — Telephone Encounter (Signed)
Patient called in today asking for a Rx for home oxygen and portable oxygen. He says he is feeling short of breath.

## 2022-04-21 ENCOUNTER — Ambulatory Visit: Payer: Medicare Other | Admitting: Family

## 2022-04-21 NOTE — Telephone Encounter (Signed)
The patient has been notified of the result and verbalized understanding.  All questions (if any) were answered. Marolyn Hammock, [place] 04/12/2022 7:82 PM     Precert Bipap titration

## 2022-04-26 ENCOUNTER — Encounter: Payer: Medicare Other | Admitting: Family

## 2022-04-26 NOTE — Progress Notes (Signed)
  This encounter was created in error - please disregard. No show 

## 2022-05-02 ENCOUNTER — Ambulatory Visit (INDEPENDENT_AMBULATORY_CARE_PROVIDER_SITE_OTHER): Payer: Medicare Other | Admitting: Family

## 2022-05-02 ENCOUNTER — Encounter: Payer: Self-pay | Admitting: Family

## 2022-05-02 ENCOUNTER — Telehealth: Payer: Self-pay

## 2022-05-02 ENCOUNTER — Ambulatory Visit
Admission: RE | Admit: 2022-05-02 | Discharge: 2022-05-02 | Disposition: A | Discharge status: Home / Self Care | Payer: Medicare Other | Source: Ambulatory Visit | Attending: Family | Admitting: Family

## 2022-05-02 VITALS — BP 118/80 | HR 56 | Temp 96.0°F | Resp 18 | Ht 66.0 in | Wt 223.8 lb

## 2022-05-02 DIAGNOSIS — R053 Chronic cough: Secondary | ICD-10-CM | POA: Diagnosis not present

## 2022-05-02 DIAGNOSIS — R0982 Postnasal drip: Secondary | ICD-10-CM | POA: Diagnosis not present

## 2022-05-02 NOTE — Patient Instructions (Signed)
-   Please get chest X-ray at [facility] at [address] then will call you with results. ? ?
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
## 2022-05-02 NOTE — Telephone Encounter (Signed)
Johnny [name] with [facility] called stating that the order that was sent over for patient's oxygen requires some additional information. The order needs a qualifying diagnosis, saturation test and office notes. This information needs to be faxed to [phone]  Message routed to Marlowe Sax, NP

## 2022-05-02 NOTE — Telephone Encounter (Signed)
May fax notes as requested.

## 2022-05-05 ENCOUNTER — Other Ambulatory Visit: Payer: Self-pay

## 2022-05-05 MED ORDER — ISOSORBIDE MONONITRATE ER 30 MG PO TB24: 30.0000 mg | ORAL_TABLET | Freq: Every day | ORAL | 1 refills | Status: DC

## 2022-05-05 MED ORDER — CARVEDILOL 6.25 MG PO TABS: 6.2500 mg | ORAL_TABLET | Freq: Two times a day (BID) | ORAL | 1 refills | Status: DC

## 2022-05-05 MED ORDER — ROSUVASTATIN CALCIUM 40 MG PO TABS: 40.0000 mg | ORAL_TABLET | Freq: Every day | ORAL | 1 refills | Status: DC

## 2022-05-05 MED ORDER — AMIODARONE HCL 200 MG PO TABS: 200.0000 mg | ORAL_TABLET | Freq: Every day | ORAL | 1 refills | Status: DC

## 2022-05-05 MED ORDER — FUROSEMIDE 40 MG PO TABS: 40.0000 mg | ORAL_TABLET | ORAL | 0 refills | Status: DC

## 2022-05-05 MED ORDER — VENLAFAXINE HCL ER 150 MG PO CP24: 150.0000 mg | ORAL_CAPSULE | Freq: Every day | ORAL | 1 refills | Status: DC

## 2022-05-05 MED ORDER — PANTOPRAZOLE SODIUM 40 MG PO TBEC: 40.0000 mg | DELAYED_RELEASE_TABLET | Freq: Every day | ORAL | 1 refills | Status: DC

## 2022-05-06 NOTE — Telephone Encounter (Signed)
Noted faxed

## 2022-05-10 ENCOUNTER — Telehealth: Payer: Self-pay | Admitting: *Deleted

## 2022-05-10 DIAGNOSIS — R053 Chronic cough: Secondary | ICD-10-CM

## 2022-05-10 NOTE — Telephone Encounter (Signed)
Patient called and stated that he has received his Oxygen Machine today from [facility].   Patient is requesting an order to be written for a Portable Oxygen tank also for when he is out and about.   Needs order faxed to [facility] Fax:[phone]  Please Advise.

## 2022-05-10 NOTE — Telephone Encounter (Signed)
Portable oxygen 2 liters via nasal cannula script written.please fax to faxed to [facility] [phone]

## 2022-05-11 NOTE — Telephone Encounter (Signed)
Forwarded to Evie, in Clinical Intake.

## 2022-05-12 NOTE — Telephone Encounter (Signed)
LATE ENTRY: Order faxed 05/11/22 to [facility]

## 2022-05-19 ENCOUNTER — Encounter (HOSPITAL_BASED_OUTPATIENT_CLINIC_OR_DEPARTMENT_OTHER): Payer: Medicare Other | Admitting: Cardiology

## 2022-05-19 ENCOUNTER — Institutional Professional Consult (permissible substitution): Payer: Medicare Other | Admitting: Student

## 2022-05-23 ENCOUNTER — Telehealth: Payer: Self-pay

## 2022-05-23 DIAGNOSIS — R053 Chronic cough: Secondary | ICD-10-CM

## 2022-05-23 NOTE — Telephone Encounter (Signed)
Patient called stating that order for portable oxygen was worded incorrectly. Patient states that he was told by [facility] that it should say portable oxygen Doctor, hospital.  Message routed to Marlowe Sax, NP

## 2022-05-23 NOTE — Telephone Encounter (Signed)
Resend script as requested by patient to [facility] for portable Oxygen Doctor, hospital.

## 2022-05-24 ENCOUNTER — Ambulatory Visit: Payer: Medicare Other

## 2022-05-24 NOTE — Telephone Encounter (Signed)
Please place order.

## 2022-05-24 NOTE — Telephone Encounter (Signed)
Script printed and was faxed by [facility] today.

## 2022-05-26 ENCOUNTER — Telehealth: Payer: Self-pay | Admitting: Family

## 2022-05-26 NOTE — Telephone Encounter (Signed)
[facility] order form for portable oxygen received.Form completed and signed.please fax as requested to [facility]   # [phone] Telephone # [phone]

## 2022-05-26 NOTE — Telephone Encounter (Signed)
Order Faxed to [facility] attn Darlina Guys.

## 2022-06-15 ENCOUNTER — Institutional Professional Consult (permissible substitution): Payer: Medicare Other | Admitting: Student

## 2022-06-16 ENCOUNTER — Telehealth: Payer: Self-pay | Admitting: Cardiology

## 2022-06-16 NOTE — Telephone Encounter (Signed)
Pt c/o Shortness Of Breath: STAT if SOB developed within the last 24 hours or pt is noticeably SOB on the phone  1. Are you currently SOB (can you hear that pt is SOB on the phone)?  No   2. How long have you been experiencing SOB?  Becoming worse over the past few months  3. Are you SOB when sitting or when up moving around?  Both, but mainly when up and moving around  4. Are you currently experiencing any other symptoms?  Not sleeping well

## 2022-06-16 NOTE — Telephone Encounter (Signed)
LMTCB

## 2022-06-17 NOTE — Telephone Encounter (Signed)
Late entry. At 2:45, spoke with patient. He was very SOB and said he is using O2 @ 2L/M. He spoke with frustration in his voice regarding SOB and wanted to know the reason he has not received his BiPAP machine, that he has all the equipment for him. I recommended that he contact [facility]. Regarding SOB, recommended that he go to the ED.

## 2022-06-18 ENCOUNTER — Other Ambulatory Visit: Payer: Self-pay

## 2022-06-18 ENCOUNTER — Observation Stay (HOSPITAL_COMMUNITY)
Admission: EM | Admit: 2022-06-18 | Discharge: 2022-06-22 | Disposition: A | Discharge status: Home Health | Payer: Medicare Other | Attending: Cardiology | Admitting: Cardiology

## 2022-06-18 ENCOUNTER — Emergency Department (HOSPITAL_COMMUNITY): Payer: Medicare Other

## 2022-06-18 DIAGNOSIS — Z87891 Personal history of nicotine dependence: Secondary | ICD-10-CM | POA: Diagnosis not present

## 2022-06-18 DIAGNOSIS — N1832 Chronic kidney disease, stage 3b: Secondary | ICD-10-CM | POA: Diagnosis not present

## 2022-06-18 DIAGNOSIS — N179 Acute kidney failure, unspecified: Secondary | ICD-10-CM | POA: Diagnosis not present

## 2022-06-18 DIAGNOSIS — Z23 Encounter for immunization: Secondary | ICD-10-CM | POA: Diagnosis not present

## 2022-06-18 DIAGNOSIS — E1122 Type 2 diabetes mellitus with diabetic chronic kidney disease: Secondary | ICD-10-CM | POA: Diagnosis not present

## 2022-06-18 DIAGNOSIS — G4733 Obstructive sleep apnea (adult) (pediatric): Secondary | ICD-10-CM | POA: Diagnosis present

## 2022-06-18 DIAGNOSIS — E1142 Type 2 diabetes mellitus with diabetic polyneuropathy: Secondary | ICD-10-CM | POA: Diagnosis present

## 2022-06-18 DIAGNOSIS — E782 Mixed hyperlipidemia: Secondary | ICD-10-CM | POA: Diagnosis present

## 2022-06-18 DIAGNOSIS — I2510 Atherosclerotic heart disease of native coronary artery without angina pectoris: Secondary | ICD-10-CM | POA: Diagnosis not present

## 2022-06-18 DIAGNOSIS — Z951 Presence of aortocoronary bypass graft: Secondary | ICD-10-CM | POA: Insufficient documentation

## 2022-06-18 DIAGNOSIS — I130 Hypertensive heart and chronic kidney disease with heart failure and stage 1 through stage 4 chronic kidney disease, or unspecified chronic kidney disease: Secondary | ICD-10-CM | POA: Diagnosis not present

## 2022-06-18 DIAGNOSIS — Z79899 Other long term (current) drug therapy: Secondary | ICD-10-CM | POA: Insufficient documentation

## 2022-06-18 DIAGNOSIS — I5032 Chronic diastolic (congestive) heart failure: Secondary | ICD-10-CM | POA: Diagnosis not present

## 2022-06-18 DIAGNOSIS — I480 Paroxysmal atrial fibrillation: Secondary | ICD-10-CM | POA: Diagnosis present

## 2022-06-18 DIAGNOSIS — Z7982 Long term (current) use of aspirin: Secondary | ICD-10-CM | POA: Insufficient documentation

## 2022-06-18 DIAGNOSIS — Z7901 Long term (current) use of anticoagulants: Secondary | ICD-10-CM | POA: Diagnosis not present

## 2022-06-18 DIAGNOSIS — I4891 Unspecified atrial fibrillation: Secondary | ICD-10-CM | POA: Diagnosis present

## 2022-06-18 DIAGNOSIS — I350 Nonrheumatic aortic (valve) stenosis: Secondary | ICD-10-CM

## 2022-06-18 DIAGNOSIS — J9611 Chronic respiratory failure with hypoxia: Secondary | ICD-10-CM | POA: Diagnosis not present

## 2022-06-18 DIAGNOSIS — I5033 Acute on chronic diastolic (congestive) heart failure: Secondary | ICD-10-CM

## 2022-06-18 DIAGNOSIS — I10 Essential (primary) hypertension: Secondary | ICD-10-CM | POA: Diagnosis present

## 2022-06-18 DIAGNOSIS — R072 Precordial pain: Principal | ICD-10-CM | POA: Insufficient documentation

## 2022-06-18 DIAGNOSIS — R079 Chest pain, unspecified: Secondary | ICD-10-CM | POA: Diagnosis present

## 2022-06-18 LAB — BASIC METABOLIC PANEL WITH GFR
Anion gap: 14 (ref 5–15)
BUN: 28 mg/dL — ABNORMAL HIGH (ref 8–23)
CO2: 26 mmol/L (ref 22–32)
Calcium: 10.5 mg/dL — ABNORMAL HIGH (ref 8.9–10.3)
Chloride: 98 mmol/L (ref 98–111)
Creatinine, Ser: 1.87 mg/dL — ABNORMAL HIGH (ref 0.61–1.24)
GFR, Estimated: 37 mL/min — ABNORMAL LOW
Glucose, Bld: 86 mg/dL (ref 70–99)
Potassium: 4.6 mmol/L (ref 3.5–5.1)
Sodium: 138 mmol/L (ref 135–145)

## 2022-06-18 LAB — CBC
HCT: 51 % (ref 39.0–52.0)
Hemoglobin: 16.7 g/dL (ref 13.0–17.0)
MCH: 28.3 pg (ref 26.0–34.0)
MCHC: 32.7 g/dL (ref 30.0–36.0)
MCV: 86.3 fL (ref 80.0–100.0)
Platelets: 200 10*3/uL (ref 150–400)
RBC: 5.91 MIL/uL — ABNORMAL HIGH (ref 4.22–5.81)
RDW: 18.1 % — ABNORMAL HIGH (ref 11.5–15.5)
WBC: 7.9 10*3/uL (ref 4.0–10.5)
nRBC: 0 % (ref 0.0–0.2)

## 2022-06-18 LAB — BRAIN NATRIURETIC PEPTIDE: B Natriuretic Peptide: 65.9 pg/mL (ref 0.0–100.0)

## 2022-06-18 LAB — TROPONIN I (HIGH SENSITIVITY)
Troponin I (High Sensitivity): 34 ng/L — ABNORMAL HIGH (ref <18)
Troponin I (High Sensitivity): 35 ng/L — ABNORMAL HIGH (ref <18)

## 2022-06-18 MED ORDER — NITROGLYCERIN 0.4 MG SL SUBL
0.4000 mg | SUBLINGUAL_TABLET | Freq: Once | SUBLINGUAL | Status: AC
  Administered 2022-06-18: 0.4 mg via SUBLINGUAL
  Filled 2022-06-18: qty 1

## 2022-06-18 MED ORDER — NITROGLYCERIN 0.4 MG SL SUBL
0.4000 mg | SUBLINGUAL_TABLET | Freq: Once | SUBLINGUAL | Status: AC
  Administered 2022-06-18: 0.4 mg via SUBLINGUAL

## 2022-06-18 NOTE — ED Notes (Addendum)
PT repositioned.

## 2022-06-18 NOTE — ED Triage Notes (Signed)
Pt here via [facility] from home for intermittent chest pain that feels like his last heart attack.  Given 324 asa en-route.  PT on 2L  (per norm).  AO x 4.

## 2022-06-18 NOTE — ED Provider Notes (Signed)
[facility] EM[facility]ENC[facility] DEPARTMENT Provider Note   CSN: [id number] Arrival date & time: 06/18/22  1535     History  Chief Complaint  Patient presents with   Chest Pain    Johnny [name] is a 76 y.o. male  who presents to the Emergency Department complaining of intermittent CP onset several months and gradually worsening.  Notes that his chest pain is localized to the sternal region.  Has a history of aortic stenosis however notes that he has not met criteria for surgical treatment as of yet.  Has associated shortness of breath.  Notes his chest pain occurs all the time whether through exertion or at rest.  No chest pain currently.  Has not taken his nitroglycerin for the past 2 weeks.  Notes that he is typically on 2 L of oxygen via nasal cannula.  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  Had a bypass in 2015.  Denies abdominal pain, vomiting.    Per patient chart review: Patient was evaluated by his cardiologist on 02/17/2022.  At that time they noted he has a history of moderate aortic stenosis however not at the point of needing TAVR at this time.  Patient recommended to continue his Eliquis, Coreg, Lasix, Crestor, Imdur, nitroglycerin as needed for CAD status post CABG.  The history is provided by the patient. No language interpreter was used.       Home Medications Prior to Admission medications   Medication Sig Start Date End Date Taking? Authorizing Provider  albuterol (VENTOLIN HFA) 108 (90 Base) MCG/ACT inhaler Inhale 1 puff into the lungs every 6 (six) hours as needed for wheezing or shortness of breath.   Yes [provider]  amiodarone (PACERONE) 200 MG tablet Take 1 tablet (200 mg total) by mouth daily. 05/05/22  Yes Ngetich, Dinah C, NP  aspirin EC 81 MG tablet Take 81 mg by mouth daily. Swallow whole.   Yes [provider]  carvedilol (COREG) 6.25 MG tablet Take 1 tablet (6.25 mg total) by mouth 2 (two) times daily.  05/05/22  Yes Ngetich, Dinah C, NP  famotidine (PEPCID) 40 MG tablet Take 40 mg by mouth daily.   Yes [provider]  fluticasone (FLONASE) 50 MCG/ACT nasal spray Place 2 sprays into both nostrils 3 (three) times daily as needed for allergies or rhinitis.   Yes [provider]  furosemide (LASIX) 40 MG tablet Take 1 tablet (40 mg total) by mouth every other day. Patient taking differently: Take 40 mg by mouth daily. 05/05/22  Yes Ngetich, Dinah C, NP  isosorbide mononitrate (IMDUR) 30 MG 24 hr tablet Take 1 tablet (30 mg total) by mouth daily. 05/05/22  Yes Ngetich, Dinah C, NP  lisinopril (ZESTRIL) 2.5 MG tablet Take 2.5 mg by mouth daily.   Yes [provider]  montelukast (SINGULAIR) 10 MG tablet Take 10 mg by mouth daily as needed (for allergies).   Yes [provider]  nitroGLYCERIN (NITROSTAT) 0.4 MG SL tablet Place 0.4 mg under the tongue every 5 (five) minutes as needed for chest pain.   Yes [provider]  pantoprazole (PROTONIX) 40 MG tablet Take 1 tablet (40 mg total) by mouth daily before breakfast. 05/05/22  Yes Ngetich, Dinah C, NP  rosuvastatin (CRESTOR) 40 MG tablet Take 1 tablet (40 mg total) by mouth daily. 05/05/22  Yes Ngetich, Dinah C, NP  TYLENOL 8 HOUR ARTHRITIS PAIN 650 MG CR tablet Take 650-1,300 mg  by mouth every 8 (eight) hours as needed for pain.   Yes [provider]  venlafaxine XR (EFFEXOR-XR) 150 MG 24 hr capsule Take 1 capsule (150 mg total) by mouth daily with breakfast. 05/05/22  Yes Ngetich, Dinah C, NP  apixaban (ELIQUIS) 5 MG TABS tablet Take 1 tablet (5 mg total) by mouth 2 (two) times daily. Patient not taking: Reported on 06/18/2022 03/25/22   Ngetich, Dinah C, NP  loratadine (CLARITIN) 10 MG tablet Take 1 tablet (10 mg total) by mouth daily for 14 days. 03/30/22 05/02/22  Ngetich, Nelda Bucks, NP      Allergies    Acacia and Metformin and related    Review of Systems   Review of Systems  Respiratory:  Positive  for shortness of breath.   Cardiovascular:  Positive for chest pain and palpitations.  Gastrointestinal:  Positive for nausea. Negative for abdominal pain and vomiting.  All other systems reviewed and are negative.   Physical Exam Updated Vital Signs BP (!) 130/111   Pulse 60   Temp 97.7 F (36.5 C) (Oral)   Resp 19   Ht _0  (1.676 m)   Wt 99.8 kg   SpO2 98%   BMI 35.51 kg/m  Physical Exam Vitals and nursing note reviewed.  Constitutional:      General: He is not in acute distress.    Appearance: He is not diaphoretic.  HENT:     Head: Normocephalic and atraumatic.     Mouth/Throat:     Pharynx: No oropharyngeal exudate.  Eyes:     General: No scleral icterus.    Conjunctiva/sclera: Conjunctivae normal.  Cardiovascular:     Rate and Rhythm: Normal rate and regular rhythm.     Pulses: Normal pulses.     Heart sounds: Normal heart sounds.  Pulmonary:     Effort: Pulmonary effort is normal. No respiratory distress.     Breath sounds: Normal breath sounds. No wheezing.  Abdominal:     General: Bowel sounds are normal.     Palpations: Abdomen is soft. There is no mass.     Tenderness: There is no abdominal tenderness. There is no guarding or rebound.  Musculoskeletal:        General: Normal range of motion.     Cervical back: Normal range of motion and neck supple.  Skin:    General: Skin is warm and dry.  Neurological:     Mental Status: He is alert.  Psychiatric:        Behavior: Behavior normal.     ED Results / Procedures / Treatments   Labs (all labs ordered are listed, but only abnormal results are displayed) Labs Reviewed  BASIC METABOLIC PANEL - Abnormal; Notable for the following components:      Result Value   BUN 28 (*)    Creatinine, Ser 1.87 (*)    Calcium 10.5 (*)    GFR, Estimated 37 (*)    All other components within normal limits  CBC - Abnormal; Notable for the following components:   RBC 5.91 (*)    RDW 18.1 (*)    All other  components within normal limits  TROPONIN I (HIGH SENSITIVITY) - Abnormal; Notable for the following components:   Troponin I (High Sensitivity) 34 (*)    All other components within normal limits  TROPONIN I (HIGH SENSITIVITY) - Abnormal; Notable for the following components:   Troponin I (High Sensitivity) 35 (*)    All other components within normal  limits  BRAIN NATRIURETIC PEPTIDE    EKG EKG Interpretation  Date/Time:  Saturday June 18 2022 23:22:11 EST Ventricular Rate:  60 PR Interval:  172 QRS Duration: 120 QT Interval:  511 QTC Calculation: 511 R Axis:   43 Text Interpretation: Sinus rhythm Nonspecific intraventricular conduction delay Nonspecific repol abnormality, diffuse leads , no sig change from prior tracing Confirmed by Octaviano Glow [phone]) on 06/18/2022 11:24:19 PM  Radiology DG Chest 2 View  Result Date: 06/18/2022 CLINICAL DATA:  Chest pain EXAM: CHEST - 2 VIEW COMPARISON:  05/02/22 CXR FINDINGS: Status post median sternotomy with fractured sternotomy wires, unchanged prior. Left-sided dual lead cardiac device in place unchanged lead positioning. Left shoulder arthroplasty. Partially visualized lumbar spinal fusion hardware with disc spacers place. One of the screws of the degenerative intermediate fractured. No pleural effusion. No pneumothorax. Unchanged cardiac and mediastinal contours. No focal airspace opacity. No displaced rib fracture. Visualized upper abdomen is unremarkable. Vertebral body heights are maintained. IMPRESSION: 1.  No radiographic finding to explain chest pain. 2. Lumbar spinal fusion hardware in place with possible fracture of one of the screws. Age indeterminate. Recommend further evaluation with a dedicated lumbar spine radiograph. Electronically Signed   By: Marin Roberts M.D.   On: 06/18/2022 16:26    Procedures Procedures    Medications Ordered in ED Medications  nitroGLYCERIN (NITROSTAT) SL tablet 0.4 mg (0.4 mg Sublingual Given  06/18/22 2018)  nitroGLYCERIN (NITROSTAT) SL tablet 0.4 mg (0.4 mg Sublingual Given 06/18/22 2111)    ED Course/ Medical Decision Making/ A&P Clinical Course as of 06/18/22 2344  Sat Jun 18, 2022  1644 This is a 76 year old gentleman with a history of CABG, obesity, sleep apnea, hypertension, hyperlipidemia, chronic angina, presenting to ED with complaint of shortness of breath.  Patient reports he has had dyspnea with exertion for several months.  He feels he cannot ever catch his breath, and if he tries to walk around the house he gets short winded.  He is on Lasix daily.  He is compliant with most of his medications but reports he does not take Imdur, cannot tell me why.  He does get chest pains intermittently on a near daily basis, not associated with exertion.  He is not currently having any chest pain.  He states that he has had some sleep studies and diagnosed with severe sleep apnea, supposed to have a BiPAP machine but due to insurance issues it has not arrived at the house.  On exam the patient is hypertensive but otherwise has normal vital signs.  Supplemental oxygen was placed on his nose for his comfort only, he is not hypoxic.  His x-ray shows no focal infiltrate, there is question of a screw plate fracture but he has no pain corresponding with the site.  EKG shows no acute ischemic findings.  Patient is in sinus rhythm with some chronic T wave inversions which are unchanged.  We are awaiting labs including troponin and BNP.  I have a low suspicion for PE, sepsis, pneumonia.  Doubt ACS, but I did encourage him to start taking his daily Imdur, which will help with both blood pressure and potential anginal symptoms. [MT]  2119 I do not hear wheezing on exam or overt fluid on exam or on x-ray to suggest that the patient is in a COPD exacerbation or CHF exacerbation requiring IV diuresis emergently.  I do suspect overall that his nips he has a chronic underlying issue, likely exacerbated by his  sleep apnea, given  his habitus, and that his symptoms may improve once he is set up with overnight BiPAP at home.  They are still working through this. [MT]  1657 Re-evaluated and patient noted to now be having chest pain. Notes that he has been dealing with the chest pain for the past 8 years and that it is his aorta that is causing the issues.  [SB]  2035 Reevaluated and patient eating sandwich at bedside.  Patient notes that he was just given the nitroglycerin. [SB]  2109 Re-evaluated and noted that his pain improved from a 9/10 to a 6/10 at this time.  Discussed with patient that we are awaiting his second troponin, patient agreeable at this time. [SB]  2231 Pt re-evaluated and noted that his pain is 5/10 at this time.  [SB]  2306 Notified by RN that patient chest pain at 10/10 at this time. Repeat EKG ordered. [SB]  2325 Consult with cardiologist, Dr. Renella Cunas who will come evaluate the patient in the ED. [SB]  2339 Discussed with patient that cardiologist will evaluate patient in the ED.  Patient appreciative at this time. [SB]    Clinical Course User Index [MT] Trifan, Carola Rhine, MD [SB] Gautam Langhorst A, PA-C                           Medical Decision Making Amount and/or Complexity of Data Reviewed Labs: ordered. Radiology: ordered. ECG/medicine tests: ordered.  Risk Prescription drug management.   Patient presents to the ED with intermittent sternal chest pain ongoing for several months and gradually worsening.  Patient has a history of bypass 2015, 8 stents placed within the past 2 years.  Patient on 2 L oxygen via nasal cannula that is continuous at baseline for him.  Patient afebrile.  On exam patient with mild left upper chest wall tenderness to palpation.  Differential diagnosis includes ACS, pneumonia, pneumothorax.   Additional history obtained:  External records from outside source obtained and reviewed including: Patient was evaluated by his cardiologist on 02/17/2022.   At that time they noted he has a history of moderate aortic stenosis however not at the point of needing TAVR at this time.  Patient recommended to continue his Eliquis, Coreg, Lasix, Crestor, Imdur, nitroglycerin as needed for CAD status post CABG.   Labs:  I ordered, and personally interpreted labs.  The pertinent results include:   Initial troponin elevated at 34, delta troponin at 35 CBC without leukocytosis BMP with slightly elevated creatinine 1.86, BUN slightly elevated 28 otherwise unremarkable BNP unremarkable at 65.9  Imaging: I ordered imaging studies including CXR I independently visualized and interpreted imaging which showed:  1.  No radiographic finding to explain chest pain.    2. Lumbar spinal fusion hardware in place with possible fracture of  one of the screws. Age indeterminate. Recommend further evaluation  with a dedicated lumbar spine radiograph.   I agree with the radiologist interpretation  Medications:  I ordered medication including nitroglycerin sublingual x2 for pain management Reevaluation of the patient after these medicines and interventions, I reevaluated the patient and found that they have improved I have reviewed the patients home medicines and have made adjustments as needed   Consultations: I requested consultation with the Cardiologist, Dr. Renella Cunas and discussed lab and imaging findings as well as pertinent plan - they recommend: Will evaluate patient in the ED.     Patient case discussed with Quincy Carnes, PA-C at sign-out. Plan at sign-out  is pending cardiology consult, symptom management, dispo as per cardiology and oncoming team, however, plans may change as per oncoming team. Patient care transferred at sign out.    This chart was dictated using voice recognition software, Dragon. Despite the best efforts of this provider to proofread and correct errors, errors may still occur which can change documentation meaning.  Final Clinical  Impression(s) / ED Diagnoses Final diagnoses:  Chest pain, unspecified type    Rx / DC Orders ED Discharge Orders     None         Aarica Wax A, PA-C 06/18/22 2349    Wyvonnia Dusky, MD 06/19/22 1309

## 2022-06-18 NOTE — ED Notes (Signed)
Lab unable to locate trop that was sent.  Will re-draw and send.  Pa notified.

## 2022-06-19 ENCOUNTER — Observation Stay (HOSPITAL_BASED_OUTPATIENT_CLINIC_OR_DEPARTMENT_OTHER): Payer: Medicare Other

## 2022-06-19 ENCOUNTER — Encounter (HOSPITAL_COMMUNITY): Payer: Self-pay | Admitting: Student in an Organized Health Care Education/Training Program

## 2022-06-19 DIAGNOSIS — G4733 Obstructive sleep apnea (adult) (pediatric): Secondary | ICD-10-CM | POA: Diagnosis not present

## 2022-06-19 DIAGNOSIS — E119 Type 2 diabetes mellitus without complications: Secondary | ICD-10-CM | POA: Diagnosis not present

## 2022-06-19 DIAGNOSIS — I5033 Acute on chronic diastolic (congestive) heart failure: Secondary | ICD-10-CM

## 2022-06-19 DIAGNOSIS — R072 Precordial pain: Secondary | ICD-10-CM

## 2022-06-19 DIAGNOSIS — I5032 Chronic diastolic (congestive) heart failure: Secondary | ICD-10-CM | POA: Insufficient documentation

## 2022-06-19 DIAGNOSIS — I350 Nonrheumatic aortic (valve) stenosis: Secondary | ICD-10-CM | POA: Diagnosis not present

## 2022-06-19 DIAGNOSIS — I480 Paroxysmal atrial fibrillation: Secondary | ICD-10-CM

## 2022-06-19 LAB — ECHOCARDIOGRAM COMPLETE
AR max vel: 0.96 cm2
AV Area VTI: 1.17 cm2
AV Area mean vel: 1 cm2
AV Mean grad: 17 mmHg
AV Peak grad: 32.9 mmHg
Ao pk vel: 2.87 m/s
Area-P 1/2: 2.27 cm2
Height: 66 in
S' Lateral: 2.3 cm
Weight: 3520 oz

## 2022-06-19 LAB — LIPID PANEL
Cholesterol: 148 mg/dL (ref 0–200)
HDL: 59 mg/dL (ref >40)
LDL Cholesterol: 40 mg/dL (ref 0–99)
Total CHOL/HDL Ratio: 2.5 RATIO
Triglycerides: 247 mg/dL — ABNORMAL HIGH (ref <150)
VLDL: 49 mg/dL — ABNORMAL HIGH (ref 0–40)

## 2022-06-19 MED ORDER — NITROGLYCERIN 0.4 MG SL SUBL
0.4000 mg | SUBLINGUAL_TABLET | SUBLINGUAL | Status: DC | PRN
  Administered 2022-06-19 (×4): 0.4 mg via SUBLINGUAL
  Filled 2022-06-19 (×2): qty 1

## 2022-06-19 MED ORDER — ACETAMINOPHEN 325 MG PO TABS
650.0000 mg | ORAL_TABLET | ORAL | Status: DC | PRN
  Administered 2022-06-22: 650 mg via ORAL
  Filled 2022-06-19: qty 2

## 2022-06-19 MED ORDER — FAMOTIDINE 20 MG PO TABS
40.0000 mg | ORAL_TABLET | Freq: Every day | ORAL | Status: DC
  Administered 2022-06-19 – 2022-06-22 (×4): 40 mg via ORAL
  Filled 2022-06-19 (×4): qty 2

## 2022-06-19 MED ORDER — ROSUVASTATIN CALCIUM 20 MG PO TABS
40.0000 mg | ORAL_TABLET | Freq: Every day | ORAL | Status: DC
  Administered 2022-06-19 – 2022-06-22 (×4): 40 mg via ORAL
  Filled 2022-06-19 (×4): qty 2

## 2022-06-19 MED ORDER — PERFLUTREN LIPID MICROSPHERE
1.0000 mL | INTRAVENOUS | Status: AC | PRN
  Administered 2022-06-19: 2 mL via INTRAVENOUS

## 2022-06-19 MED ORDER — CARVEDILOL 6.25 MG PO TABS
6.2500 mg | ORAL_TABLET | Freq: Two times a day (BID) | ORAL | Status: DC
  Administered 2022-06-19 – 2022-06-22 (×7): 6.25 mg via ORAL
  Filled 2022-06-19 (×3): qty 1
  Filled 2022-06-19: qty 2
  Filled 2022-06-19 (×3): qty 1

## 2022-06-19 MED ORDER — ISOSORBIDE MONONITRATE ER 30 MG PO TB24
30.0000 mg | ORAL_TABLET | Freq: Every day | ORAL | Status: DC
  Administered 2022-06-19: 30 mg via ORAL
  Filled 2022-06-19: qty 1

## 2022-06-19 MED ORDER — VENLAFAXINE HCL ER 150 MG PO CP24
150.0000 mg | ORAL_CAPSULE | Freq: Every day | ORAL | Status: DC
  Administered 2022-06-19 – 2022-06-22 (×4): 150 mg via ORAL
  Filled 2022-06-19 (×5): qty 1

## 2022-06-19 MED ORDER — LISINOPRIL 5 MG PO TABS
2.5000 mg | ORAL_TABLET | Freq: Every day | ORAL | Status: DC
  Administered 2022-06-19: 2.5 mg via ORAL
  Filled 2022-06-19 (×2): qty 1

## 2022-06-19 MED ORDER — ASPIRIN 81 MG PO TBEC: 81.0000 mg | DELAYED_RELEASE_TABLET | Freq: Every day | ORAL | Status: DC

## 2022-06-19 MED ORDER — PANTOPRAZOLE SODIUM 40 MG PO TBEC
40.0000 mg | DELAYED_RELEASE_TABLET | Freq: Every day | ORAL | Status: DC
  Administered 2022-06-19 – 2022-06-22 (×4): 40 mg via ORAL
  Filled 2022-06-19 (×4): qty 1

## 2022-06-19 MED ORDER — APIXABAN 5 MG PO TABS
5.0000 mg | ORAL_TABLET | Freq: Two times a day (BID) | ORAL | Status: DC
  Administered 2022-06-19 – 2022-06-22 (×7): 5 mg via ORAL
  Filled 2022-06-19 (×7): qty 1

## 2022-06-19 MED ORDER — AMIODARONE HCL 200 MG PO TABS: 200.0000 mg | ORAL_TABLET | Freq: Every day | ORAL | Status: DC

## 2022-06-19 MED ORDER — ONDANSETRON HCL 4 MG/2ML IJ SOLN: 4.0000 mg | Freq: Four times a day (QID) | INTRAMUSCULAR | Status: DC | PRN

## 2022-06-19 MED ORDER — FUROSEMIDE 40 MG PO TABS
40.0000 mg | ORAL_TABLET | Freq: Every day | ORAL | Status: DC
  Administered 2022-06-19: 40 mg via ORAL
  Filled 2022-06-19: qty 1
  Filled 2022-06-19: qty 2

## 2022-06-19 NOTE — H&P (Addendum)
Cardiology Admission History and Physical:   Patient ID: Johnny [name] MRN: [id number]; DOB: 11-25-1945   Admission date: 06/18/2022  Primary Care Provider: Caesar Bookman, NP [facility] Cardiologist: Little Ishikawa, MD  [facility] Electrophysiologist:  None   Chief Complaint: SOB/DOE  Patient Profile:   Johnny [name] is a 76 y.o. male with SSS s/p BSCI DC PPM (DOI 04/2021), paroxysmal AF, CAD s/p CABG, PCI, moderate AS, HFpEF, severe OSA, CKD3, DM2, HLD and HTN who presents with ongoing SOB/DOE and mild chest pain.  History of Present Illness:   Johnny [name] reports gradual onset dyspnea on exertion over the past several months which is worsened along with mild chest pain.  He feels like he cannot catch his breath and over the past 2 months has had similar symptoms.  He was started on home oxygen around 2 months ago however has a very distant smoking history of around 15 pack years over 40 years ago.  His prior anginal equivalents are different than his current symptoms.  In the past he felt that someone heavy was "doing a dance on his chest" and it was more consistent with chest pressure and dyspnea.   He is originally from [place] and relocated to [place], [place] after his wife passed 7 years ago.  Fortunately he had some issues with the girlfriend and lost money so he moved to [place] and currently rents part of a condo/apartment.  He was last seen by Dr. Royann Shivers on 02/21/22 at that time denied any chest pain, DOE, orthopnea, PND, syncope, palpitations, or other anginal equivalents.  He apparently had some prior issue with superficial left infraclavicular infection requiring antibiotics.  There is a large scar with fat atrophy but otherwise no focal evidence of chronic infection.  During that visit he was AP 43% and VP 0% without recorded AF since device implant.  His underlying rhythm was sinus bradycardia (HR 40 with 1:1 conduction).  He was continued on amiodarone  for AF control and was prescribed apixaban for stroke risk reduction however has not been taking this for several years.  He said some doctor told him to stop taking it but he was unsure why.  He has not had significant bleeding issues but has had intermittent epistaxis not requiring transfusion or medical evaluation.  No changes were made at that appointment.  On 11/18 he was brought in by [facility] for intermittent CP that was similar to his prior heart attack (although his report is that it is different).  He was given aspirin 324 mg en route.   VS on ED evaluation: P 60, BP 130/111, T 97.7, RR 19, O2 98%/RA  Labs all WNLs other than mild AKI (sCr 1.87) on CKD (bl sCR 1.4-1.7) with minimally elevated but flat hsT (34->35). BNP nl (65).   VS during my evaluation: P 62, BP 145/76, RR 12, O2 98%/2L [place]   Johnny [name] reported mild to moderate chest discomfort a 5/10 severity without radiation.  He said earlier in the day it was between 8-10/10 severity with some improvement with SL NG.  He was not taking prescribed Imdur as he said that his BP was low and another doctor told him to stop it. His sx feel like a "gradual ocean of both DOE and chest pain." He also has pain over his PPM site which is chronic. Minor chores around the house have been resulting in DOE. He does use 2 pillows to sleep at night although he denies PND, orthopnea, or  significant LE edema.   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[place], [place]   Past Surgical History:  Procedure Laterality Date   BACK SURGERY  1975   Lower Back Surgery, Dr.Seltzer   BACK SURGERY  1985   Dr.Seltzer   SHOULDER SURGERY Left 1965   Dr.Seltzer   TONSILLECTOMY  1950   Dr.Perellie    Medications Prior to Admission: Prior to Admission medications   Medication Sig Start Date End Date Taking? Authorizing Provider  albuterol  (VENTOLIN HFA) 108 (90 Base) MCG/ACT inhaler Inhale 1 puff into the lungs every 6 (six) hours as needed for wheezing or shortness of breath.   Yes [provider]  amiodarone (PACERONE) 200 MG tablet Take 1 tablet (200 mg total) by mouth daily. 05/05/22  Yes Ngetich, Dinah C, NP  aspirin EC 81 MG tablet Take 81 mg by mouth daily. Swallow whole.   Yes [provider]  carvedilol (COREG) 6.25 MG tablet Take 1 tablet (6.25 mg total) by mouth 2 (two) times daily. 05/05/22  Yes Ngetich, Dinah C, NP  famotidine (PEPCID) 40 MG tablet Take 40 mg by mouth daily.   Yes [provider]  fluticasone (FLONASE) 50 MCG/ACT nasal spray Place 2 sprays into both nostrils 3 (three) times daily as needed for allergies or rhinitis.   Yes [provider]  furosemide (LASIX) 40 MG tablet Take 1 tablet (40 mg total) by mouth every other day. Patient taking differently: Take 40 mg by mouth daily. 05/05/22  Yes Ngetich, Dinah C, NP  isosorbide mononitrate (IMDUR) 30 MG 24 hr tablet Take 1 tablet (30 mg total) by mouth daily. 05/05/22  Yes Ngetich, Dinah C, NP  lisinopril (ZESTRIL) 2.5 MG tablet Take 2.5 mg by mouth daily.   Yes [provider]  montelukast (SINGULAIR) 10 MG tablet Take 10 mg by mouth daily as needed (for allergies).   Yes [provider]  nitroGLYCERIN (NITROSTAT) 0.4 MG SL tablet Place 0.4 mg under the tongue every 5 (five) minutes as needed for chest pain.   Yes [provider]  pantoprazole (PROTONIX) 40 MG tablet Take 1 tablet (40 mg total) by mouth daily before breakfast. 05/05/22  Yes Ngetich, Dinah C, NP  rosuvastatin (CRESTOR) 40 MG tablet Take 1 tablet (40 mg total) by mouth daily. 05/05/22  Yes Ngetich, Dinah C, NP  TYLENOL 8 HOUR ARTHRITIS PAIN 650 MG CR tablet Take 650-1,300 mg by mouth every 8 (eight) hours as needed for pain.   Yes [provider]  venlafaxine XR (EFFEXOR-XR) 150 MG 24 hr capsule Take 1 capsule (150 mg total) by  mouth daily with breakfast. 05/05/22  Yes Ngetich, Dinah C, NP  apixaban (ELIQUIS) 5 MG TABS tablet Take 1 tablet (5 mg total) by mouth 2 (two) times daily. Patient not taking: Reported on 06/18/2022 03/25/22   Ngetich, Dinah C, NP  loratadine (CLARITIN) 10 MG tablet Take 1 tablet (10 mg total) by mouth daily for 14 days. 03/30/22 05/02/22  Ngetich, Donalee Citrin, NP   Allergies:    Allergies  Allergen Reactions   Acacia Itching   Metformin And Related Other (See Comments)    Metformin caused the patient's legs to become weak   Social History:   Social History   Socioeconomic History   Marital status: Widowed    Spouse name: Not  on file   Number of children: Not on file   Years of education: Not on file   Highest education level: Not on file  Occupational History   Not on file  Tobacco Use   Smoking status: Former    Packs/day: 1.00    Years: 10.00    Total pack years: 10.00    Types: Cigarettes   Smokeless tobacco: Never  Substance and Sexual Activity   Alcohol use: Never   Drug use: Never   Sexual activity: Not on file  Other Topics Concern   Not on file  Social History Narrative   Tobacco use, amount per day now: None   Past tobacco use, amount per day: 1 pack   How many years did you use tobacco: 10 years.   Alcohol use (drinks per week): None   Diet:   Do you drink/eat things with caffeine: Yes   Marital status:  Widow                                What year were you married? 1972   Do you live in a house, apartment, assisted living, condo, trailer, etc.? House   Is it one or more stories? One   How many persons live in your home? One   Do you have pets in your home?( please list) No   Highest Level of education completed? 12th grade.   Current or past profession: Psychologist, occupational, Investment banker, operational, Clinical biochemist.    Do you exercise?   No                               Type and how often?   Do you have a living will? Yes   Do you have a DNR form?     No                              If  not, do you want to discuss one?   Do you have signed POA/HPOA forms?  Yes                      If so, please bring to you appointment      Do you have any difficulty bathing or dressing yourself? No   Do you have any difficulty preparing food or eating? No   Do you have any difficulty managing your medications? No   Do you have any difficulty managing your finances? Yes   Do you have any difficulty affording your medications? No   Social Determinants of Corporate investment banker Strain: Not on file  Food Insecurity: Not on file  Transportation Needs: Not on file  Physical Activity: Not on file  Stress: Not on file  Social Connections: Not on file  Intimate Partner Violence: Not on file    Family History:   The patient's family history includes Cancer in his father; Stroke in his brother and sister.    ROS:   Review of Systems: [y] = yes,  = no      General: Weight gain ; Weight loss ; Anorexia ; Fatigue ; Fever ; Chills ; Weakness    Cardiac: Chest pain/pressure [y]; Resting SOB [y]; Exertional SOB [y]; Orthopnea ; Pedal Edema ; Palpitations ;  Syncope [ ] ; Presyncope [ ] ; Paroxysmal nocturnal dyspnea [ ]    Pulmonary: Cough [ ] ; Wheezing [ ] ; Hemoptysis [ ] ; Sputum [ ] ; Snoring [ ]    GI: Vomiting [ ] ; Dysphagia [ ] ; Melena [ ] ; Hematochezia [ ] ; Heartburn [ ] ; Abdominal pain [ ] ; Constipation [ ] ; Diarrhea [ ] ; BRBPR [ ]    GU: Hematuria [ ] ; Dysuria [ ] ; Nocturia [ ]  Vascular: Pain in legs with walking [ ] ; Pain in feet with lying flat [ ] ; Non-healing sores [ ] ; Stroke [ ] ; TIA [ ] ; Slurred speech [ ] ;   Neuro: Headaches [ ] ; Vertigo [ ] ; Seizures [ ] ; Paresthesias [ ] ;Blurred vision [ ] ; Diplopia [ ] ; Vision changes [ ]    Ortho/Skin: Arthritis [ ] ; Joint pain [ ] ; Muscle pain [ ] ; Joint swelling [ ] ; Back Pain [ ] ; Rash [ ]    Psych: Depression [ ] ; Anxiety [ ]    Heme: Bleeding problems [ ] ; Clotting disorders [ ] ; Anemia [ ]    Endocrine:  Diabetes [ ] ; Thyroid dysfunction [ ]    Physical Exam/Data:   Vitals:   06/18/22 2300 06/18/22 2330 06/19/22 0030 06/19/22 0451  BP: (!) 143/98 (!) 145/97 (!) 145/76 (!) 163/99  Pulse: 62 (!) 59 62 (!) 56  Resp: 17 12 17 19   Temp:    97.9 F (36.6 C)  TempSrc:    Oral  SpO2: 97% 93% 96% 97%  Weight:      Height:       No intake or output data in the 24 hours ending 06/19/22 0538    06/18/2022    3:40 PM 05/02/2022    9:22 AM 04/05/2022    7:00 AM  Last 3 Weights  Weight (lbs) 220 lb 223 lb 12.8 oz 220 lb  Weight (kg) 99.791 kg 101.515 kg 99.791 kg     Body mass index is 35.51 kg/m.  General:  Well nourished, well developed, in no acute distress HEENT: normal Lymph: no adenopathy Neck: no JVD Endocrine:  No thryomegaly Vascular: No carotid bruits; FA pulses 2+ bilaterally without bruits  Cardiac:  normal S1, S2; RRR; 2/6 systolic murmur at RUSB  Lungs:  clear to auscultation bilaterally, no wheezing, rhonchi or rales  Abd: soft, nontender, no hepatomegaly  Ext: no edema Musculoskeletal:  No deformities, BUE and BLE strength normal and equal Skin: warm and dry  Neuro:  CNs 2-12 intact, no focal abnormalities noted Psych:  Normal affect   EKG:  The ECG that was done (06/18/22, 15:45:30) was personally reviewed and demonstrates APVS 60, PR 88, QRS 108, QT/Qtc 468/468, no ischemic changes Repeat ECG (06/18/22, 23:22:11) with APVS 60, PR 172, QRS 120, QT/Qtc 511/511, no ischemic changes   Relevant CV Studies:  TTE Result date: 12/29/21  1. Left ventricular ejection fraction, by estimation, is 55 to 60%. The  left ventricle has normal function. The left ventricle has no regional  wall motion abnormalities. There is mild left ventricular hypertrophy of  the basal-septal segment. Left  ventricular diastolic parameters are indeterminate.   2. Right ventricular systolic function is low normal. The right  ventricular size is normal. There is normal pulmonary artery systolic   pressure.   3. Left atrial size was mild to moderately dilated.   4. A small pericardial effusion is present.   5. The mitral valve is grossly normal. Trivial mitral valve  regurgitation. No evidence of mitral stenosis.   6. The aortic valve is calcified. There is  moderate calcification of the  aortic valve. There is moderate thickening of the aortic valve. Aortic  valve regurgitation is trivial. Moderate aortic valve stenosis. Aortic  valve area, by VTI measures 1.23 cm.  Aortic valve mean gradient measures 22.4 mmHg. Aortic valve Vmax measures  3.15 m/s.   7. Aortic dilatation noted. There is borderline dilatation of the  ascending aorta, measuring 38 mm.   8. The inferior vena cava is normal in size with greater than 50%  respiratory variability, suggesting right atrial pressure of 3 mmHg.   Laboratory Data:  High Sensitivity Troponin:   Recent Labs  Lab 06/18/22 1544 06/18/22 2150  TROPONINIHS 34* 35*      Chemistry Recent Labs  Lab 06/18/22 1544  NA 138  K 4.6  CL 98  CO2 26  GLUCOSE 86  BUN 28*  CREATININE 1.87*  CALCIUM 10.5*  GFRNONAA 37*  ANIONGAP 14    No results for input(s): "PROT", "ALBUMIN", "AST", "ALT", "ALKPHOS", "BILITOT" in the last 168 hours. Hematology Recent Labs  Lab 06/18/22 1544  WBC 7.9  RBC 5.91*  HGB 16.7  HCT 51.0  MCV 86.3  MCH 28.3  MCHC 32.7  RDW 18.1*  PLT 200   BNP Recent Labs  Lab 06/18/22 1700  BNP 65.9    DDimer No results for input(s): "DDIMER" in the last 168 hours.  Radiology/Studies:  DG Chest 2 View  Result Date: 06/18/2022 CLINICAL DATA:  Chest pain EXAM: CHEST - 2 VIEW COMPARISON:  05/02/22 CXR FINDINGS: Status post median sternotomy with fractured sternotomy wires, unchanged prior. Left-sided dual lead cardiac device in place unchanged lead positioning. Left shoulder arthroplasty. Partially visualized lumbar spinal fusion hardware with disc spacers place. One of the screws of the degenerative  intermediate fractured. No pleural effusion. No pneumothorax. Unchanged cardiac and mediastinal contours. No focal airspace opacity. No displaced rib fracture. Visualized upper abdomen is unremarkable. Vertebral body heights are maintained. IMPRESSION: 1.  No radiographic finding to explain chest pain. 2. Lumbar spinal fusion hardware in place with possible fracture of one of the screws. Age indeterminate. Recommend further evaluation with a dedicated lumbar spine radiograph. Electronically Signed   By: Lorenza Cambridge M.D.   On: 06/18/2022 16:26    TIMI Risk Score for Unstable Angina or Non-ST Elevation MI:   The patient's TIMI risk score is 6, which indicates a 41% risk of all cause mortality, new or recurrent myocardial infarction or need for urgent revascularization in the next 14 days.   Assessment and Plan:   Chest pain DOE/SOB Moderate AS CAD s/p CABG and PCI  HFpEF Johnny [name] presents with minimally elevated hsT that is flat and normal ECG.  Low likelihood of unstable angina resulting in DOE/SOB with associated chest pain.  Symptoms have been fairly chronic and seemingly more related to HFpEF than anginal equivalent.  We will plan to repeat echo to reassess AS however presume will still be moderate since his last assessment was only 5 months ago.  He has not been taking his apixaban as prescribed so we will switch aspirin to apixaban since he has not had a recent coronary event as below. - stop OP ASA 81 mg daily, start apixaban 5 mg bid  - start imdur 30 mg daily  - continue OP coreg 6.25 mg bid - echo today   OSA Uncontrolled OSA is likely contributing to his current symptoms as he has severe OSA.  Unfortunately home sleep study was not interpretable for BiPAP settings.  He  had a CPAP in the past that was unfortunately stolen.  He tolerated his CPAP well however this was over 2 years ago.  He will need to be scheduled for an in lab sleep study and I think once he gets on BiPAP he will  have improvement in his daytime shortness of breath symptoms. - schedule in lab sleep study/repeat referral  SSS s/p BSCI DC PPM  pAF He has been prescribed apixaban for stroke risk reduction related to his atrial fibrillation however has not been taking this.  He does have intermittent epistaxis but has not required any intervention.  Despite him not having recent atrial fibrillation on device interrogation I still would favor oral anticoagulation over antiplatelet therapy with his high CHA2DS2-VASc of 6 (41M, HTN, DM2, CAD). I did discontinue his amio today, although I think its low probability that the amio is contributing to his current sx, I do not think he needs to stay on this if he has not had any sustained episodes in over a year. Especially now that he is requiring 2 L [place] at baseline. If sx do not improve with better management of the OSA then would consider repeat PFTs and HRCT to evaluate for amio induced lung injury. For now will just d/c.   DM2 - recheck A1c, diet controlled  HTN - continue OP lisinopril   MDD - continue OP venlafaxine   Severity of Illness: The appropriate patient status for this patient is INPATIENT. Inpatient status is judged to be reasonable and necessary in order to provide the required intensity of service to ensure the patient's safety. The patient's presenting symptoms, physical exam findings, and initial radiographic and laboratory data in the context of their chronic comorbidities is felt to place them at high risk for further clinical deterioration. Furthermore, it is not anticipated that the patient will be medically stable for discharge from the hospital within 2 midnights of admission.   * I certify that at the point of admission it is my clinical judgment that the patient will require inpatient hospital care spanning beyond 2 midnights from the point of admission due to high intensity of service, high risk for further deterioration and high frequency  of surveillance required.*   For questions or updates, please contact [facility] Please consult [email or web address] for contact info under   Signed, Linton Rump, MD  06/19/2022 5:38 AM

## 2022-06-19 NOTE — Telephone Encounter (Signed)
Agree with plan, he went to ED and was admitted

## 2022-06-19 NOTE — Care Management Obs Status (Signed)
MEDICARE OBSERVATION STATUS NOTIFICATION   Patient Details  Name: Johnny [name] MRN: [id number] Date of Birth: 07/08/1946   Medicare Observation Status Notification Given:  Yes    [name], RN 06/19/2022, 3:29 PM 

## 2022-06-19 NOTE — Progress Notes (Signed)
Reviewed note from cardiology fellow early this morning.  Admitted with chest pain, dyspnea on exertion and shortness of breath.  He has a history of moderate AAS, CAD status post CABG and PCI remotely as well as HFpEF.  Had gradual onset of dyspnea on exertion for several months along with chest pain but prior complaints of angina are different from his current symptoms.  He tells me that he has had several doctors who have told him that he needs his valve fixed but other doctors have said its not time yet.  He has a history of an atrial fibrillation which is suppressed on amiodarone as well as permanent pacemaker.  Admitted yesterday with intermittent chest pain prior to similar CAD and heart attack.  Hypertensive on exam and 130/111 mmHg.  Also found to be in mild AKI with serum creatinine 1.87 with a baseline of 1.4-1.7.  Troponin was minimally elevated with flat trend at 34 and 35 and BNP was normal.  Currently has been told not to take nitrates in the past because of low blood pressure.  Agree with assessment and plan by Dr. Cherly Beach who saw patient earlier this morning.  2D echo has been ordered and pending.  This will help to reassess whether he has had progression of his aortic stenosis that could be contributing to his symptoms.  Apparently had also had some nosebleeds and was told to stop his apixaban.  His aspirin was stopped and he is now placed back on apixaban 5 mg twice daily.  3 with restarting apixaban as it was stopped because of some nosebleeds but none of them have required intervention and he has a high CHA2DS2-VASc score of 6.  Agree with  continuing outpatient carvedilol.  He has been on amiodarone but since he has had no recurrent A-fib on pacer check agree with stopping this in the setting of shortness of breath and now requiring 2 L O2 at home with risk of amiodarone toxicity.  Hold on starting long-acting nitrates in the setting of AS until we have assessed the degree of aortic  stenosis

## 2022-06-19 NOTE — ED Notes (Signed)
ED TO INPATIENT HANDOFF REPORT  ED Nurse Name and Phone #:   S Name/Age/Gender Johnny [name] 76 y.o. male Room/Bed: 007C/007C  Code Status   Code Status: Full Code  Home/SNF/Other Home Patient oriented to: self, place, time, and situation Is this baseline? Yes   Triage Complete: Triage complete  Chief Complaint Chest pain [R07.9]  Triage Note Pt here via [facility] from home for intermittent chest pain that feels like his last heart attack.  Given 324 asa en-route.  PT on 2L [place] (per norm).  AO x 4.   Allergies Allergies  Allergen Reactions   Acacia Itching   Metformin And Related Other (See Comments)    Metformin caused the patient's legs to become weak    Level of Care/Admitting Diagnosis ED Disposition     ED Disposition  Admit   Condition  --   Comment  Hospital Area: [facility] [[id number]]  Level of Care: Progressive [102]  Admit to Progressive based on following criteria: CARDIOVASCULAR & THORACIC of moderate stability with acute coronary syndrome symptoms/low risk myocardial infarction/hypertensive urgency/arrhythmias/heart failure potentially compromising stability and stable post cardiovascular intervention patients.  May place patient in observation at [facility] or [facility] if equivalent level of care is available:: No  Covid Evaluation: Asymptomatic - no recent exposure (last 10 days) testing not required  Diagnosis: Chest pain [211941]  Admitting Physician: Linton Rump [[id number]]  Attending Physician: Lewayne Bunting [[id number]]          B Medical/Surgery History 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[place], [place]   Past Surgical History:  Procedure Laterality Date   BACK SURGERY  1975   Lower Back Surgery, Dr.Seltzer   BACK SURGERY  1985   Dr.Seltzer   SHOULDER SURGERY Left 1965   Dr.Seltzer    TONSILLECTOMY  1950   Dr.Perellie     A IV Location/Drains/Wounds Patient Lines/Drains/Airways Status     Active Line/Drains/Airways     Name Placement date Placement time Site Days   Peripheral IV 06/18/22 20 G Anterior;Left;Proximal Forearm 06/18/22  --  Forearm  1            Intake/Output Last 24 hours  Intake/Output Summary (Last 24 hours) at 06/19/2022 1230 Last data filed at 06/19/2022 1148 Gross per 24 hour  Intake --  Output 250 ml  Net -250 ml    Labs/Imaging Results for orders placed or performed during the hospital encounter of 06/18/22 (from the past 48 hour(s))  Basic metabolic panel     Status: Abnormal   Collection Time: 06/18/22  3:44 PM  Result Value Ref Range   Sodium 138 135 - 145 mmol/L   Potassium 4.6 3.5 - 5.1 mmol/L   Chloride 98 98 - 111 mmol/L   CO2 26 22 - 32 mmol/L   Glucose, Bld 86 70 - 99 mg/dL    Comment: Glucose reference range applies only to samples taken after fasting for at least 8 hours.   BUN 28 (H) 8 - 23 mg/dL   Creatinine, Ser 8.18 (H) 0.61 - 1.24 mg/dL   Calcium 56.3 (H) 8.9 - 10.3 mg/dL   GFR, Estimated 37 (L) >60 mL/min    Comment: (NOTE) Calculated using the CKD-EPI Creatinine Equation (2021)    Anion gap 14 5 - 15    Comment: Performed  at [facility], [address], [place], [place] [address]  CBC     Status: Abnormal   Collection Time: 06/18/22  3:44 PM  Result Value Ref Range   WBC 7.9 4.0 - 10.5 K/uL   RBC 5.91 (H) 4.22 - 5.81 MIL/uL   Hemoglobin 16.7 13.0 - 17.0 g/dL   HCT 06.2 69.4 - 85.4 %   MCV 86.3 80.0 - 100.0 fL   MCH 28.3 26.0 - 34.0 pg   MCHC 32.7 30.0 - 36.0 g/dL   RDW 62.7 (H) 03.5 - 00.9 %   Platelets 200 150 - 400 K/uL   nRBC 0.0 0.0 - 0.2 %    Comment: Performed at [facility], [address] [place], [place] [address]  Troponin I (High Sensitivity)     Status: Abnormal   Collection Time: 06/18/22  3:44 PM  Result Value Ref Range   Troponin I (High Sensitivity) 34 (H) <18  ng/L    Comment: (NOTE) Elevated high sensitivity troponin I (hsTnI) values and significant  changes across serial measurements may suggest ACS but many other  chronic and acute conditions are known to elevate hsTnI results.  Refer to the "Links" section for chest pain algorithms and additional  guidance. Performed at [facility], [address], [place], [place] [address]   Brain natriuretic peptide     Status: None   Collection Time: 06/18/22  5:00 PM  Result Value Ref Range   B Natriuretic Peptide 65.9 0.0 - 100.0 pg/mL    Comment: Performed at [facility], [address] [place], [place] [address]  Troponin I (High Sensitivity)     Status: Abnormal   Collection Time: 06/18/22  9:50 PM  Result Value Ref Range   Troponin I (High Sensitivity) 35 (H) <18 ng/L    Comment: (NOTE) Elevated high sensitivity troponin I (hsTnI) values and significant  changes across serial measurements may suggest ACS but many other  chronic and acute conditions are known to elevate hsTnI results.  Refer to the "Links" section for chest pain algorithms and additional  guidance. Performed at [facility], [address], [place], [place] [address]    DG Chest 2 View  Result Date: 06/18/2022 CLINICAL DATA:  Chest pain EXAM: CHEST - 2 VIEW COMPARISON:  05/02/22 CXR FINDINGS: Status post median sternotomy with fractured sternotomy wires, unchanged prior. Left-sided dual lead cardiac device in place unchanged lead positioning. Left shoulder arthroplasty. Partially visualized lumbar spinal fusion hardware with disc spacers place. One of the screws of the degenerative intermediate fractured. No pleural effusion. No pneumothorax. Unchanged cardiac and mediastinal contours. No focal airspace opacity. No displaced rib fracture. Visualized upper abdomen is unremarkable. Vertebral body heights are maintained. IMPRESSION: 1.  No radiographic finding to explain chest pain. 2. Lumbar spinal fusion  hardware in place with possible fracture of one of the screws. Age indeterminate. Recommend further evaluation with a dedicated lumbar spine radiograph. Electronically Signed   By: Lorenza Cambridge M.D.   On: 06/18/2022 16:26    Pending Labs Unresulted Labs (From admission, onward)     Start     Ordered   06/19/22 0549  Hemoglobin A1c  Add-on,   AD        06/19/22 0548   06/19/22 0549  Lipid panel  Add-on,   AD        06/19/22 0548            Vitals/Pain Today's  Vitals   06/19/22 0825 06/19/22 0830 06/19/22 0920 06/19/22 1200  BP:  (!) 133/109  (!) 136/115  Pulse: (!) 58 60  (!) 58  Resp: 17 16  17   Temp:   98 F (36.7 C)   TempSrc:   Oral   SpO2: 96% 96%  95%  Weight:      Height:      PainSc:        Isolation Precautions No active isolations  Medications Medications  furosemide (LASIX) tablet 40 mg (40 mg Oral Given 06/19/22 0921)  carvedilol (COREG) tablet 6.25 mg (6.25 mg Oral Given 06/19/22 0719)  isosorbide mononitrate (IMDUR) 24 hr tablet 30 mg (30 mg Oral Given 06/19/22 0921)  lisinopril (ZESTRIL) tablet 2.5 mg (2.5 mg Oral Given 06/19/22 0921)  nitroGLYCERIN (NITROSTAT) SL tablet 0.4 mg (0.4 mg Sublingual Given 06/19/22 0938)  rosuvastatin (CRESTOR) tablet 40 mg (40 mg Oral Given 06/19/22 0921)  venlafaxine XR (EFFEXOR-XR) 24 hr capsule 150 mg (has no administration in time range)  famotidine (PEPCID) tablet 40 mg (40 mg Oral Given 06/19/22 0921)  pantoprazole (PROTONIX) EC tablet 40 mg (40 mg Oral Given 06/19/22 0719)  acetaminophen (TYLENOL) tablet 650 mg (has no administration in time range)  ondansetron (ZOFRAN) injection 4 mg (has no administration in time range)  apixaban (ELIQUIS) tablet 5 mg (5 mg Oral Given 06/19/22 0922)  nitroGLYCERIN (NITROSTAT) SL tablet 0.4 mg (0.4 mg Sublingual Given 06/18/22 2018)  nitroGLYCERIN (NITROSTAT) SL tablet 0.4 mg (0.4 mg Sublingual Given 06/18/22 2111)    Mobility walks with device     Focused  Assessments Cardiac Assessment Handoff:  Cardiac Rhythm: Normal sinus rhythm No results found for: "CKTOTAL", "CKMB", "CKMBINDEX", "TROPONINI" No results found for: "DDIMER" Does the Patient currently have chest pain? No    R Recommendations: See Admitting Provider Note  Report given to:   Additional Notes:

## 2022-06-19 NOTE — Plan of Care (Signed)
  Problem: Education: Goal: Knowledge of General Education information will improve Description: Including pain rating scale, medication(s)/side effects and non-pharmacologic comfort measures Outcome: Progressing   Problem: Clinical Measurements: Goal: Ability to maintain clinical measurements within normal limits will improve Outcome: Progressing Goal: Diagnostic test results will improve Outcome: Progressing Goal: Respiratory complications will improve Outcome: Progressing   Problem: Pain Managment: Goal: General experience of comfort will improve Outcome: Progressing   Problem: Nutrition: Goal: Adequate nutrition will be maintained Outcome: Completed/Met

## 2022-06-19 NOTE — Progress Notes (Signed)
Pt states he has no family available to help him outside of hospital and is requesting information on facility placement at discharge. Order placed for [facility] consult. Dierdre Highman, RN

## 2022-06-19 NOTE — Care Management Obs Status (Signed)
MEDICARE OBSERVATION STATUS NOTIFICATION   Patient Details  Name: Johnny [name] MRN: [id number] Date of Birth: Mar 06, 1946   Medicare Observation Status Notification Given:  Yes    Jazmyn Offner G., RN 06/19/2022, 3:29 PM

## 2022-06-20 DIAGNOSIS — I350 Nonrheumatic aortic (valve) stenosis: Secondary | ICD-10-CM

## 2022-06-20 DIAGNOSIS — N179 Acute kidney failure, unspecified: Secondary | ICD-10-CM | POA: Diagnosis not present

## 2022-06-20 DIAGNOSIS — I5032 Chronic diastolic (congestive) heart failure: Secondary | ICD-10-CM

## 2022-06-20 DIAGNOSIS — R079 Chest pain, unspecified: Secondary | ICD-10-CM | POA: Diagnosis not present

## 2022-06-20 LAB — BASIC METABOLIC PANEL
Anion gap: 12 (ref 5–15)
BUN: 27 mg/dL — ABNORMAL HIGH (ref 8–23)
CO2: 28 mmol/L (ref 22–32)
Calcium: 10.3 mg/dL (ref 8.9–10.3)
Chloride: 99 mmol/L (ref 98–111)
Creatinine, Ser: 1.82 mg/dL — ABNORMAL HIGH (ref 0.61–1.24)
GFR, Estimated: 38 mL/min — ABNORMAL LOW (ref >60)
Glucose, Bld: 110 mg/dL — ABNORMAL HIGH (ref 70–99)
Potassium: 3.8 mmol/L (ref 3.5–5.1)
Sodium: 139 mmol/L (ref 135–145)

## 2022-06-20 MED ORDER — MELATONIN 5 MG PO TABS
5.0000 mg | ORAL_TABLET | Freq: Every evening | ORAL | Status: DC | PRN
  Administered 2022-06-20 – 2022-06-21 (×2): 5 mg via ORAL
  Filled 2022-06-20 (×2): qty 1

## 2022-06-20 MED ORDER — INFLUENZA VAC A&B SA ADJ QUAD 0.5 ML IM PRSY
0.5000 mL | PREFILLED_SYRINGE | INTRAMUSCULAR | Status: AC
  Administered 2022-06-22: 0.5 mL via INTRAMUSCULAR
  Filled 2022-06-20 (×3): qty 0.5

## 2022-06-20 MED ORDER — ISOSORBIDE MONONITRATE ER 30 MG PO TB24
30.0000 mg | ORAL_TABLET | Freq: Every day | ORAL | Status: DC
  Administered 2022-06-20 – 2022-06-22 (×3): 30 mg via ORAL
  Filled 2022-06-20 (×3): qty 1

## 2022-06-20 NOTE — Evaluation (Signed)
Occupational Therapy Evaluation Patient Details Name: Johnny [name] MRN: [id number] DOB: 1946/07/30 Today's Date: 06/20/2022   History of Present Illness 76 y.o. male presents to ED 11/18 with ongoing SOB/DOE and mild chest pain. Chest x-ray showed no acute cardiopulmonary findings.  PMH: SSS s/p BSCI DC PPM (DOI 04/2021), paroxysmal AF, CAD s/p CABG, PCI, moderate AS, HFpEF, severe OSA, CKD3, DM2, HLD and HTN   Clinical Impression   Johnny [name] was evaluated s/p the above admission list, he is typically mod I at baseline, he lives alone, drives and completes all IADLs. Per pt report, he does not have any assist available at d/c. Upon evaluation pt was limited by generalized weakness, poor activity tolerance and safety awareness. Overall he was min G for transfers and mobility with no report of dizziness after positional changes or ambulation with RW. Due to deficits listed below, he require up to min A for ADLs. Pt also required cues for attention throughout session. Recommend d/c to home with [facility] for continued therapy.      Recommendations for follow up therapy are one component of a multi-disciplinary discharge planning process, led by the attending physician.  Recommendations may be updated based on patient status, additional functional criteria and insurance authorization.   Follow Up Recommendations  Home health OT     Assistance Recommended at Discharge PRN  Patient can return home with the following Assist for transportation    Functional Status Assessment  Patient has had a recent decline in their functional status and demonstrates the ability to make significant improvements in function in a reasonable and predictable amount of time.  Equipment Recommendations  BSC/3in1       Precautions / Restrictions Precautions Precautions: Fall Precaution Comments: reports 2 falls in last 6 months Restrictions Weight Bearing Restrictions: No      Mobility Bed Mobility Overal bed  mobility: Modified Independent             General bed mobility comments: increased time and effort    Transfers Overall transfer level: Needs assistance Equipment used: Rolling walker (2 wheels) Transfers: Sit to/from Stand Sit to Stand: Min guard           General transfer comment: no dizziness      Balance Overall balance assessment: Needs assistance Sitting-balance support: No upper extremity supported, Feet supported Sitting balance-Leahy Scale: Fair     Standing balance support: Bilateral upper extremity supported, During functional activity, Reliant on assistive device for balance Standing balance-Leahy Scale: Poor                             ADL either performed or assessed with clinical judgement   ADL Overall ADL's : Needs assistance/impaired Eating/Feeding: Independent;Sitting   Grooming: Min guard;Standing   Upper Body Bathing: Set up;Sitting   Lower Body Bathing: Minimal assistance;Sit to/from stand   Upper Body Dressing : Set up;Sitting   Lower Body Dressing: Minimal assistance;Sit to/from stand   Toilet Transfer: Min guard;Ambulation;Rolling walker (2 wheels)   Toileting- Clothing Manipulation and Hygiene: Supervision/safety;Sitting/lateral lean       Functional mobility during ADLs: Min guard;Rolling walker (2 wheels) General ADL Comments: on 2L [place]. slow guarded gait     Vision Baseline Vision/History: 1 Wears glasses Vision Assessment?: No apparent visual deficits     Perception Perception Perception Tested?: No   Praxis Praxis Praxis tested?: Not tested    Pertinent Vitals/Pain Pain Assessment Pain Assessment: Faces Faces Pain Scale: No  hurt Pain Intervention(s): Monitored during session     Hand Dominance Right   Extremity/Trunk Assessment Upper Extremity Assessment Upper Extremity Assessment: Generalized weakness   Lower Extremity Assessment Lower Extremity Assessment: Generalized weakness   Cervical /  Trunk Assessment Cervical / Trunk Assessment: Kyphotic   Communication Communication Communication: No difficulties   Cognition Arousal/Alertness: Awake/alert Behavior During Therapy: WFL for tasks assessed/performed Overall Cognitive Status: Within Functional Limits for tasks assessed                                 General Comments: needs re-direction to more appropriate topics (i.e. asked therapist several times if she is single, comments about women in the hallway, stating "lets walk to your house")     General Comments  VSS on 2L. no increased dizziness with positional changes            Home Living Family/patient expects to be discharged to:: Private residence Living Arrangements: Alone   Type of Home: House Home Access: Stairs to enter [employer] of Steps: 6 Entrance Stairs-Rails: Left Home Layout: One level     Bathroom Shower/Tub: Chief Strategy Officer: Standard Bathroom Accessibility: Yes   Home Equipment: Agricultural consultant (2 wheels);Cane - single point   Additional Comments: reports no assist at d/c      Prior Functioning/Environment Prior Level of Function : Independent/Modified Independent;Driving             Mobility Comments: slowed, 2 recent falls. both occured in bedroom without AD ADLs Comments: mod I, drives, completed IADLs        OT Problem List: Decreased strength;Decreased range of motion;Decreased activity tolerance;Impaired balance (sitting and/or standing);Decreased safety awareness;Decreased knowledge of use of DME or AE;Decreased knowledge of precautions;Cardiopulmonary status limiting activity      OT Treatment/Interventions: Self-care/ADL training;Therapeutic exercise;DME and/or AE instruction;Therapeutic activities;Patient/family education;Balance training    OT Goals(Current goals can be found in the care plan section) Acute Rehab OT Goals Patient Stated Goal: home OT Goal Formulation:  With patient Time For Goal Achievement: 07/04/22 Potential to Achieve Goals: Good ADL Goals Additional ADL Goal #1: Pt will complete BADLs with mod I Additional ADL Goal #2: Pt will indep recall at least 3 energy conservation technqiues to apply to the home setting.  OT Frequency: Min 2X/week       AM-PAC OT "6 Clicks" Daily Activity     Outcome Measure Help from another person eating meals?: None Help from another person taking care of personal grooming?: A Little Help from another person toileting, which includes using toliet, bedpan, or urinal?: A Little Help from another person bathing (including washing, rinsing, drying)?: A Little Help from another person to put on and taking off regular upper body clothing?: None Help from another person to put on and taking off regular lower body clothing?: A Little 6 Click Score: 20   End of Session Equipment Utilized During Treatment: Rolling walker (2 wheels);Oxygen Nurse Communication: Mobility status  Activity Tolerance: Patient tolerated treatment well Patient left: in bed;with call bell/phone within reach;with bed alarm set  OT Visit Diagnosis: Unsteadiness on feet (R26.81);Other abnormalities of gait and mobility (R26.89);Muscle weakness (generalized) (M62.81);History of falling (Z91.81);Repeated falls (R29.6)                Time: 6237-6283 OT Time Calculation (min): 15 min Charges:  OT General Charges $OT Visit: 1 Visit OT Evaluation $OT Eval Moderate Complexity: 1  Mod   Donia Pounds 06/20/2022, 4:35 PM

## 2022-06-20 NOTE — Progress Notes (Addendum)
Rounding Note    Patient Name: Johnny [name] Date of Encounter: 06/20/2022  [facility] Cardiologist: Little Ishikawa, MD   Subjective   No acute overnight events. Patient states he is feeling"sh*tty" today. He continues to have shortness of breath with very minimal activity. He also continues to have some mild chest pain that he describes as like the ocean where it will gradually build and then peak and then go away. This waxes and wanes but it does seem to peak with activity. He states he is currently have 5/10 pain but appears comfortable. He also report some pain shooting down his right arm earlier this morning but is thinks this may be to arthritis in his shoulder. He wants to know what is causing his symptoms.  Inpatient Medications    Scheduled Meds:  apixaban  5 mg Oral BID   carvedilol  6.25 mg Oral BID WC   famotidine  40 mg Oral Daily   furosemide  40 mg Oral Daily   lisinopril  2.5 mg Oral Daily   pantoprazole  40 mg Oral QAC breakfast   rosuvastatin  40 mg Oral Daily   venlafaxine XR  150 mg Oral Q breakfast   Continuous Infusions:  PRN Meds: acetaminophen, nitroGLYCERIN, ondansetron (ZOFRAN) IV   Vital Signs    Vitals:   06/19/22 2058 06/19/22 2157 06/19/22 2347 06/20/22 0357  BP: (!) 93/58 107/76 101/65 126/89  Pulse: 67 60 60 60  Resp: Temp: 98.1 F (36.7 C)  98.2 F (36.8 C) 97.8 F (36.6 C)  TempSrc: Oral  Oral Oral  SpO2: 92% 94% 98% 95%  Weight:      Height:        Intake/Output Summary (Last 24 hours) at 06/20/2022 0801 Last data filed at 06/19/2022 2025 Gross per 24 hour  Intake 240 ml  Output 850 ml  Net -610 ml      06/19/2022    3:18 PM 06/18/2022    3:40 PM 05/02/2022    9:22 AM  Last 3 Weights  Weight (lbs) 210 lb 8.6 oz 220 lb 223 lb 12.8 oz  Weight (kg) 95.5 kg 99.791 kg 101.515 kg      Telemetry    Atrial paced rhythm with rates in the 60s. - Personally Reviewed  ECG    No new ECG  tracing today. - Personally Reviewed  Physical Exam   GEN: No acute distress.   Neck: No JVD. Cardiac: RRR. II-III/VI systolic murmur. No rubs or gallops. Respiratory: Clear to auscultation bilaterally. No wheezes, rhonchi, or rales. GI: Soft, non-distended, and non-tender. MS: No lower extremity edema. No deformity. Skin: Warm and dry. Neuro:  No focal deficits. Psych: Normal affect. Responds appropriately.  Labs    High Sensitivity Troponin:   Recent Labs  Lab 06/18/22 1544 06/18/22 2150  TROPONINIHS 34* 35*     Chemistry Recent Labs  Lab 06/18/22 1544  NA 138  K 4.6  CL 98  CO2 26  GLUCOSE 86  BUN 28*  CREATININE 1.87*  CALCIUM 10.5*  GFRNONAA 37*  ANIONGAP 14    Lipids  Recent Labs  Lab 06/19/22 1519  CHOL 148  TRIG 247*  HDL 59  LDLCALC 40  CHOLHDL 2.5    Hematology Recent Labs  Lab 06/18/22 1544  WBC 7.9  RBC 5.91*  HGB 16.7  HCT 51.0  MCV 86.3  MCH 28.3  MCHC 32.7  RDW 18.1*  PLT 200   Thyroid  No results for input(s): "TSH", "FREET4" in the last 168 hours.  BNP Recent Labs  Lab 06/18/22 1700  BNP 65.9    DDimer No results for input(s): "DDIMER" in the last 168 hours.   Radiology    ECHOCARDIOGRAM COMPLETE  Result Date: 06/19/2022    ECHOCARDIOGRAM REPORT   Patient Name:   ZIYAN SCHOON Date of Exam: 06/19/2022 Medical Rec #:  [id number]     Height:       66.0 in Accession #:    [id number]    Weight:       220.0 lb Date of Birth:  1946/02/12    BSA:          2.082 m Patient Age:    76 years      BP:           136/115 mmHg Patient Gender: M             HR:           60 bpm. Exam Location:  Inpatient Procedure: 2D Echo, Color Doppler, Cardiac Doppler and Intracardiac            Opacification Agent Indications:    Aortic Stenosis i35.0  History:        Patient has prior history of Echocardiogram examinations, most                 recent 12/15/2021. Risk Factors:Diabetes, Dyslipidemia,                 Hypertension and Sleep Apnea.   Sonographer:    Irving Burton Senior RDCS Referring Phys: [id number] MATTHEW A CARLISLE  Sonographer Comments: Technically difficult due to lung interference IMPRESSIONS  1. Left ventricular ejection fraction, by estimation, is 65 to 70%. The left ventricle has normal function. The left ventricle has no regional wall motion abnormalities. Left ventricular diastolic parameters are indeterminate.  2. Right ventricular systolic function is normal. The right ventricular size is normal. Tricuspid regurgitation signal is inadequate for assessing PA pressure.  3. The mitral valve is normal in structure. Trivial mitral valve regurgitation. No evidence of mitral stenosis. Moderate mitral annular calcification.  4. The aortic valve is tricuspid. There is moderate calcification of the aortic valve. There is moderate thickening of the aortic valve. Aortic valve regurgitation is not visualized. Moderate aortic valve stenosis. Aortic valve area, by VTI measures 1.17 cm. Aortic valve Vmax measures 2.87 m/s. Aortic valve mean gradient measures 17.0 mmHg. Aortic valve peak gradient measures 32.9     Although the mean AVG and peak velocity are in the mild range, the DVI is low at 0.37 and SVI is low at 24. 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  5. There is borderline dilatation of the ascending aorta, measuring 37 mm.  6. The inferior vena cava is normal in size with greater than 50% respiratory variability, suggesting right atrial pressure of 3 mmHg.  7. Compared to prior study dated 12/15/2021, mean AVG has decreased from 22.38mmHg to but AVA has decreased from 1.23cm2 to 1.17cm2, SVI has dropped from 32 to 24 but DVI is the same at 0.37. Overall no significant change in degree of AS. FINDINGS  Left Ventricle: Left ventricular ejection fraction, by estimation, is 65 to 70%. The left ventricle has normal function. The left ventricle has no regional wall motion abnormalities. Definity contrast agent was given IV  to delineate the left ventricular  endocardial borders. The left ventricular internal cavity size was normal in  size. There is no concentric left ventricular hypertrophy. Left ventricular diastolic parameters are indeterminate. Normal left ventricular filling pressure. Right Ventricle: The right ventricular size is normal. No increase in right ventricular wall thickness. Right ventricular systolic function is normal. Tricuspid regurgitation signal is inadequate for assessing PA pressure. Left Atrium: Left atrial size was normal in size. Right Atrium: Right atrial size was normal in size. Pericardium: There is no evidence of pericardial effusion. Mitral Valve: The mitral valve is normal in structure. Moderate mitral annular calcification. Trivial mitral valve regurgitation. No evidence of mitral valve stenosis. Tricuspid Valve: The tricuspid valve is normal in structure. Tricuspid valve regurgitation is not demonstrated. No evidence of tricuspid stenosis. Aortic Valve: The aortic valve is tricuspid. There is moderate calcification of the aortic valve. There is moderate thickening of the aortic valve. Aortic valve regurgitation is not visualized. Moderate aortic stenosis is present. Aortic valve mean gradient measures 17.0 mmHg. Aortic valve peak gradient measures 32.9 mmHg. Aortic valve area, by VTI measures 1.17 cm. Pulmonic Valve: The pulmonic valve was normal in structure. Pulmonic valve regurgitation is not visualized. No evidence of pulmonic stenosis. Aorta: The aortic root is normal in size and structure. There is borderline dilatation of the ascending aorta, measuring 37 mm. Venous: The inferior vena cava is normal in size with greater than 50% respiratory variability, suggesting right atrial pressure of 3 mmHg. IAS/Shunts: No atrial level shunt detected by color flow Doppler.  LEFT VENTRICLE PLAX 2D LVIDd:         3.90 cm   Diastology LVIDs:         2.30 cm   LV e' medial:    4.68 cm/s LV PW:         1.20 cm    LV E/e' medial:  11.6 LV IVS:        1.10 cm   LV e' lateral:   6.20 cm/s LVOT diam:     2.00 cm   LV E/e' lateral: 8.7 LV SV:         51 LV SV Index:   24 LVOT Area:     3.14 cm  RIGHT VENTRICLE RV S prime:     7.18 cm/s TAPSE (M-mode): 1.4 cm LEFT ATRIUM           Index        RIGHT ATRIUM           Index LA diam:      3.80 cm 1.82 cm/m   RA Area:     13.10 cm LA Vol (A2C): 34.1 ml 16.37 ml/m  RA Volume:   26.20 ml  12.58 ml/m LA Vol (A4C): 51.9 ml 24.92 ml/m  AORTIC VALVE AV Area (Vmax):    0.96 cm AV Area (Vmean):   1.00 cm AV Area (VTI):     1.17 cm AV Vmax:           287.00 cm/s AV Vmean:          195.000 cm/s AV VTI:            0.434 m AV Peak Grad:      32.9 mmHg AV Mean Grad:      17.0 mmHg LVOT Vmax:         87.90 cm/s LVOT Vmean:        62.200 cm/s LVOT VTI:          0.162 m LVOT/AV VTI ratio: 0.37  AORTA Ao Root diam: 3.20 cm Ao Asc diam:  3.70 cm MITRAL VALVE MV Area (PHT): 2.27 cm    SHUNTS MV Decel Time: 334 msec    Systemic VTI:  0.16 m MV E velocity: 54.10 cm/s  Systemic Diam: 2.00 cm MV A velocity: 49.40 cm/s MV E/A ratio:  1.10 Armanda Magicraci Turner MD Electronically signed by Armanda Magicraci Turner MD Signature Date/Time: 06/19/2022/2:02:30 PM    Final    DG Chest 2 View  Result Date: 06/18/2022 CLINICAL DATA:  Chest pain EXAM: CHEST - 2 VIEW COMPARISON:  05/02/22 CXR FINDINGS: Status post median sternotomy with fractured sternotomy wires, unchanged prior. Left-sided dual lead cardiac device in place unchanged lead positioning. Left shoulder arthroplasty. Partially visualized lumbar spinal fusion hardware with disc spacers place. One of the screws of the degenerative intermediate fractured. No pleural effusion. No pneumothorax. Unchanged cardiac and mediastinal contours. No focal airspace opacity. No displaced rib fracture. Visualized upper abdomen is unremarkable. Vertebral body heights are maintained. IMPRESSION: 1.  No radiographic finding to explain chest pain. 2. Lumbar spinal fusion hardware  in place with possible fracture of one of the screws. Age indeterminate. Recommend further evaluation with a dedicated lumbar spine radiograph. Electronically Signed   By: Lorenza CambridgeHemant  Desai M.D.   On: 06/18/2022 16:26    Cardiac Studies   Echocardiogram 06/19/2022: Impressions: 1. Left ventricular ejection fraction, by estimation, is 65 to 70%. The  left ventricle has normal function. The left ventricle has no regional  wall motion abnormalities. Left ventricular diastolic parameters are  indeterminate.   2. Right ventricular systolic function is normal. The right ventricular  size is normal. Tricuspid regurgitation signal is inadequate for assessing  PA pressure.   3. The mitral valve is normal in structure. Trivial mitral valve  regurgitation. No evidence of mitral stenosis. Moderate mitral annular  calcification.   4. The aortic valve is tricuspid. There is moderate calcification of the  aortic valve. There is moderate thickening of the aortic valve. Aortic  valve regurgitation is not visualized. Moderate aortic valve stenosis.  Aortic valve area, by VTI measures  1.17 cm. Aortic valve Vmax measures 2.87 m/s. Aortic valve mean gradient  measures 17.0 mmHg. Aortic valve peak gradient measures 32.9      Although the mean AVG and peak velocity are in the mild range, the DVI  is low at 0.37 and SVI is low at 24. 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   5. There is borderline dilatation of the ascending aorta, measuring 37  mm.   6. The inferior vena cava is normal in size with greater than 50%  respiratory variability, suggesting right atrial pressure of 3 mmHg.   7. Compared to prior study dated 12/15/2021, mean AVG has decreased from  22.574mmHg to 17mmHg but AVA has decreased from 1.23cm2 to 1.17cm2, SVI has  dropped from 32 to 24 but DVI is the same at 0.37. Overall no significant  change in degree of AS.    Patient Profile     76 y.o. male with a history of  CAD s/p CABG in 2015, HFpEF, paroxysmal atrial fibrillation, sick sinus syndrome s/p PPM in 04/2021, moderate aortic stenosis, hypertension, hyperlipidemia, type 2 diabetes mellitus, CKD stage III, and severe obstructive sleep apnea not currently on CPAP/BiPAP who was admitted on 06/19/2022 for further evaluation of shortness of breath and mild chest pain.  Assessment & Plan    Dyspnea on Exertion Chest Pain CAD s/p CABG History of CABG in 2015 and multiple PCIs in the past. Last cath  in 06/2021 per outside records showed occluded SVG to mid OM2 with patents stents of native OM2, patent LIMA to LAD with filling defect in the distal LAD (possibly thrombus vs spontaneous dissection) but too small for PCI, CTO of distal LCX with collaterals, and moderate diffuse disease of 1st and 2nd Diag. Myoview in 11/2021 showed no evidence of ischemia. Patient presented with dyspnea on exertion as well as some chest pain. High-sensitivity troponin minimally elevated and flat at 34 >> 35. Echo this admission showed LVEF of 65-70% with no regional wall motion abnormalities.  - He continues to report shortness of breath with minimal activity and mild chest pain but he appears comfortable. - Continue Coreg 6.25mg  twice daily. - Will hold off on adding Imdur for now given paradoxical low flow low gradient aortic stenosis and soft BP at times with BP in the 90s to low 100s last night. - Aspirin was stopped and he was restarted on Eliquis given atrial fibrillation (he had previously stopped this due to nose bleeds). - Continue high-intensity statin. - Troponin trend not consistent with ACS. This is a chronic issue.   Chronic HFpEF BNP normal. Chest x-ray showed no acute cardiopulmonary findings.  Echo showed LVEF of 65-70% with no regional wall motion abnormalities. Weighs 210 lbs today which is down from 213 lbs at last office visit in 01/2022. - Euvolemic on exam.  - Will hold Lasix for now given AKI. - Continue to  monitor daily weights, strict I/Os, and renal function.   Paroxysmal Atrial Fibrillation Device interrogation showed no atrial fibrillation.  - Telemetry shows a-paced rhythm. - Continue Coreg 6.25mg  twice daily. - Amiodarone has been stopped given no atrial fibrillation noted on device interrogation and possibility that Amidarone could be the cause of his shortness of breath although I think this is unlikely given patient was already having shortness of breath prior to initiation of Amiodarone in 11/2021. - CHA2DS2-VASc = 6 (CAD, CHF, HTN, DM age x2). Patient is not on any anticoagulation. Patient had been prescribed Eliquis in the past but had not been taking this due to intermittent epistaxis. Aspirin has been stopped and he has been restarted on Eliquis given elevated CHA2DS-VASc score.   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  - Followed by Dr. Royann Shivers.   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 Mean gradient 17.26mmHg and peaked gradient 32.9 mmHg but DVI low at 0.37 and SVI low at 24.  Consistent with moderate AS   Chronic Hypoxic Respiratory Failure Patient has chronic O2 at home and has been requiring 2L of O2 here but was on room air this morning. He states he does not have a history of COPD or asthma. He states he has been tested for this in the past but I don't see any PFTs in our system or in Care Everywhere.    Hypertension BP labile. Systolic BP ranged from 93 to 536 yesterday. BP well controlled this morning. - Continue Coreg 6.25mg  twice daily. - Will hold home Lisinopril for now given AKI.   Hyperlipidemia Lipid panel this admission: Total Cholesterol 150, Triglycerides 170, HDL 54, LDL 62. - Continue Crestor 40mg  daily.   AKI on CKD Stage III Creatinine 1.87 on admission and 1.82 today. Baseline around 1.4 to 1.5. - Will hold Lisinopril and home Lasix for now given  AKI.  Obstructive Sleep Apnea He has severe sleep apnea. He was previously on CPAP but this was unfortunately  stolen a few years ago. - Per Dr. Mayford Knife, will need in-lab sleep study and hope that is once he gets on BiPAP he will have improvement in his daytime shortness of breath symptoms.  Of note, patient expressed concerns about going home without anyone to help him and was requesting information on assisted facility placement at discharge. TOC has been consulted.   For questions or updates, please contact [facility] Please consult [email or web address] for contact info under        Signed, Corrin Parker, PA-C  06/20/2022, 8:01 AM    Patient seen and examined.  Agree with above documentation.  On exam, patient is alert and oriented, regular rate and rhythm, 3/6 systolic murmur, lungs CTAB, no LE edema or JVD.  Echo reviewed, EF is normal and AS remains moderate.  No further cardiac workup at this time.  Cr elevated, will hold lasix and lisinopril. SW consult for placement.  Little Ishikawa, MD

## 2022-06-20 NOTE — Evaluation (Signed)
Physical Therapy Evaluation Patient Details Name: Johnny [name] MRN: [id number] DOB: [birth year] Today's Date: 06/20/2022  History of Present Illness  76 y.o. male presents to ED 11/18 with ongoing SOB/DOE and mild chest pain. Chest x-ray showed no acute cardiopulmonary findings.  PMH: SSS s/p BSCI DC PPM (DOI 04/2021), paroxysmal AF, CAD s/p CABG, PCI, moderate AS, HFpEF, severe OSA, CKD3, DM2, HLD and HTN  Clinical Impression  PTA pt living alone in single story home with 6 steps to enter. Pt reports increasing difficulty with mobility, ADL, iADLs but that he has no one to assist him so he was doing it all himself. Pt limited in safe mobility by dizziness with positional change and mobilization. Provided vestibular screen and noted to have increase in dizziness with finger follow and especially with head turns. Have asked colleague to perform Vestibular Evaluation. Pt is currently min guard for bed mobility, transfers and ambulation in hallway with RW. PT recommending HHPT, (pt reports he would refuse SNF) and RW at discharge.   Pt would benefit from outside assist for iADLs however pt reports no one can help. PT will continue to follow acutely.     Recommendations for follow up therapy are one component of a multi-disciplinary discharge planning process, led by the attending physician.  Recommendations may be updated based on patient status, additional functional criteria and insurance authorization.  Follow Up Recommendations Home health PT      Assistance Recommended at Discharge Set up Supervision/Assistance  Patient can return home with the following  A little help with walking and/or transfers;Assistance with cooking/housework;Assist for transportation;Help with stairs or ramp for entrance    Equipment Recommendations Rolling walker (2 wheels)  Recommendations for Other Services  Other (comment) (vestibular eval)    Functional Status Assessment Patient has had a recent decline in  their functional status and demonstrates the ability to make significant improvements in function in a reasonable and predictable amount of time.     Precautions / Restrictions Precautions Precautions: Fall Precaution Comments: reports 2 falls in last 6 months Restrictions Weight Bearing Restrictions: No      Mobility  Bed Mobility Overal bed mobility: Modified Independent Bed Mobility: Supine to Sit, Sit to Supine     Supine to sit: Supervision Sit to supine: Min guard   General bed mobility comments: supervision for coming to EoB, HoB elevated and use of bed rail to pull to EoB, min guard for safety, however with increased effort pt able to move LE into bed    Transfers Overall transfer level: Needs assistance Equipment used: Rolling walker (2 wheels) Transfers: Sit to/from Stand Sit to Stand: Min guard           General transfer comment: min guard for safety, vc for hand placement, c/o dizziness    Ambulation/Gait Ambulation/Gait assistance: Min guard Gait Distance (Feet): 120 Feet Assistive device: Rolling walker (2 wheels) Gait Pattern/deviations: Step-through pattern, Decreased step length - right, Decreased step length - left, Trunk flexed, Shuffle Gait velocity: slowed Gait velocity interpretation: <1.8 ft/sec, indicate of risk for recurrent falls   General Gait Details: min guard for safety, pt with c/o dizziness throughout, 3/4 DoE      Balance Overall balance assessment: Needs assistance Sitting-balance support: No upper extremity supported, Feet supported Sitting balance-Leahy Scale: Fair     Standing balance support: Bilateral upper extremity supported, During functional activity, Reliant on assistive device for balance Standing balance-Leahy Scale: Poor  Pertinent Vitals/Pain Pain Assessment Pain Assessment: Faces Faces Pain Scale: Hurts little more Pain Location: generalized with mobility, Pain  Descriptors / Indicators: Grimacing, Guarding Pain Intervention(s): Limited activity within patient's tolerance, Monitored during session, Repositioned    Home Living Family/patient expects to be discharged to:: Private residence Living Arrangements: Alone   Type of Home: House Home Access: Stairs to enter Entrance Stairs-Rails: Left Entrance Stairs-Number of Steps: 6   Home Layout: One level Home Equipment: None      Prior Function Prior Level of Function : Independent/Modified Independent;Driving             Mobility Comments: slowed          Extremity/Trunk Assessment   Upper Extremity Assessment Upper Extremity Assessment: Defer to OT evaluation    Lower Extremity Assessment Lower Extremity Assessment: Generalized weakness       Communication   Communication: No difficulties  Cognition Arousal/Alertness: Awake/alert Behavior During Therapy: WFL for tasks assessed/performed (slightly inappropriate) Overall Cognitive Status: Within Functional Limits for tasks assessed                                 General Comments: mildly inappropriate conversation        General Comments General comments (skin integrity, edema, etc.): Pt reports he has had 2 falls recently one in which he hit his head on headboard, attempted to perform Vestibular screen and pt with increased dizziness with head turns, have asked for PT Vestibular exam, BP supine 148/60, sitting 134/78, after ambulaton 163/67. max noted HR 89bpm pt on 2L O2 via [place] at baseline, maintained SpO2 >90%O2 with ambulation        Assessment/Plan    PT Assessment Patient needs continued PT services  PT Problem List Decreased strength;Decreased activity tolerance;Decreased balance;Decreased mobility;Decreased cognition;Decreased safety awareness       PT Treatment Interventions DME instruction;Gait training;Stair training;Functional mobility training;Therapeutic activities;Therapeutic  exercise;Balance training;Cognitive remediation;Patient/family education    PT Goals (Current goals can be found in the Care Plan section)  Acute Rehab PT Goals Patient Stated Goal: sing [place] PT Goal Formulation: With patient Time For Goal Achievement: 07/04/22 Potential to Achieve Goals: Fair    Frequency Min 3X/week        AM-PAC PT "6 Clicks" Mobility  Outcome Measure Help needed turning from your back to your side while in a flat bed without using bedrails?: A Little Help needed moving from lying on your back to sitting on the side of a flat bed without using bedrails?: A Little Help needed moving to and from a bed to a chair (including a wheelchair)?: A Little Help needed standing up from a chair using your arms (e.g., wheelchair or bedside chair)?: A Little Help needed to walk in hospital room?: A Little Help needed climbing 3-5 steps with a railing? : A Lot 6 Click Score: 17    End of Session Equipment Utilized During Treatment: Gait belt;Oxygen Activity Tolerance: Treatment limited secondary to medical complications (Comment) (dizziness) Patient left: in bed;with call bell/phone within reach;with bed alarm set (MD in room) Nurse Communication: Mobility status;Other (comment) (Vestibular eval) PT Visit Diagnosis: Unsteadiness on feet (R26.81);Other abnormalities of gait and mobility (R26.89);Muscle weakness (generalized) (M62.81);Difficulty in walking, not elsewhere classified (R26.2)    Time: 2703-5009 PT Time Calculation (min) (ACUTE ONLY): 34 min   Charges:   PT Evaluation $PT Eval Moderate Complexity: 1 Mod PT Treatments $Gait Training: 8-22 mins  Charmin Aguiniga B. Migdalia Dk PT, DPT Acute Rehabilitation Services Please use secure chat or  Call Office [phone]   [place] 06/20/2022, 2:20 PM

## 2022-06-20 NOTE — Progress Notes (Signed)
Physical Therapy Treatment Patient Details Name: Johnny [name] MRN: [id number] DOB: 1946-03-27 Today's Date: 06/20/2022   History of Present Illness 76 y.o. male presents to ED 11/18 with ongoing SOB/DOE and mild chest pain. Chest x-ray showed no acute cardiopulmonary findings.  PMH: SSS s/p BSCI DC PPM (DOI 04/2021), paroxysmal AF, CAD s/p CABG, PCI, moderate AS, HFpEF, severe OSA, CKD3, DM2, HLD and HTN    PT Comments    Pt evaluated by colleague earlier for mobility and this therapist following up for vestibular testing.  Pt had reported 2 week hx of dizziness earlier and had some dizziness with head turns and had reported fall and hitting head.  Upon further discussion with pt, dizziness began before fall and tended to occur when up and walking. His orthostatic blood pressures were negative earlier.  All vestibular testing negative.  Some limitations with testing due to posture (forward head) but used bed features to compensate.  Unable to reproduce any of pt's symptoms with vestibular testing this afternoon.   Continue mobility plan of care.    Recommendations for follow up therapy are one component of a multi-disciplinary discharge planning process, led by the attending physician.  Recommendations may be updated based on patient status, additional functional criteria and insurance authorization.  Follow Up Recommendations  Home health PT     Assistance Recommended at Discharge Set up Supervision/Assistance  Patient can return home with the following A little help with walking and/or transfers;Assistance with cooking/housework;Assist for transportation;Help with stairs or ramp for entrance   Equipment Recommendations  Rolling walker (2 wheels)    Recommendations for Other Services Other (comment)               General Comments   Pt had mobility evaluation performed earlier today and exhibited some dizziness, so f/u for vestibular testing.  Orthostatic blood pressures were  negative earlier today with therapy.   Hx of dizziness:  Reports present for about 2 weeks.  States feels like he is "on a boat that is capsizing." States symptoms last a few mins and tend to occur when he has been up and walking.  Reports symptoms typically do not occur with initial transfers or rolling.  He did have dizziness when turning head with therapist earlier.  Reports a fall 1 week ago and hitting his head but dizziness started prior to that.  Also, states he had similar episode ~1 year ago and saw and outpt PT for vertigo but reports he was told at that point that he didn't have vertigo. Denies any recent colds or sinus infections.   Vertebral artery screen: negative Smooth Pursuit: Intact, no dizziness, no nystagmus Gaze stabilization: Intact, no dizziness, no nystagmus Head Turns: no dizziness, no nystagmus , limited ROM Head Thrust: Intact, no dizziness, no nystagmus Test of Skew: negative [place]: negative Horizontal Roll: negative  Limitations: pt with chronic neck pain/stiffness, forward head and rounded shoulders that limited positional testing.  Used bed features and pillows for positioning for Test.      Pertinent Vitals/Pain      Home Living                          Prior Function            PT Goals (current goals can now be found in the care plan section) Progress towards PT goals: Progressing toward goals    Frequency    Min 3X/week  PT Plan      Co-evaluation              AM-PAC PT "6 Clicks" Mobility   Outcome Measure  Help needed turning from your back to your side while in a flat bed without using bedrails?: A Little Help needed moving from lying on your back to sitting on the side of a flat bed without using bedrails?: A Little Help needed moving to and from a bed to a chair (including a wheelchair)?: A Little Help needed standing up from a chair using your arms (e.g., wheelchair or bedside chair)?: A Little Help  needed to walk in hospital room?: A Little Help needed climbing 3-5 steps with a railing? : A Little 6 Click Score: 18    End of Session Equipment Utilized During Treatment: Gait belt;Oxygen Activity Tolerance: Treatment limited secondary to medical complications (Comment) Patient left: in bed;with call bell/phone within reach;with bed alarm set Nurse Communication: Mobility status;Other (comment) PT Visit Diagnosis: Unsteadiness on feet (R26.81);Other abnormalities of gait and mobility (R26.89);Muscle weakness (generalized) (M62.81);Difficulty in walking, not elsewhere classified (R26.2)     Time: 2992-4268 PT Time Calculation (min) (ACUTE ONLY): 32 min  Charges:  $Therapeutic Activity: 23-37 mins                     Anise Salvo, PT [facility] [phone]    [name] 06/20/2022, 3:29 PM

## 2022-06-21 DIAGNOSIS — R079 Chest pain, unspecified: Secondary | ICD-10-CM | POA: Diagnosis not present

## 2022-06-21 DIAGNOSIS — I2510 Atherosclerotic heart disease of native coronary artery without angina pectoris: Secondary | ICD-10-CM

## 2022-06-21 DIAGNOSIS — N179 Acute kidney failure, unspecified: Secondary | ICD-10-CM | POA: Diagnosis not present

## 2022-06-21 DIAGNOSIS — I5032 Chronic diastolic (congestive) heart failure: Secondary | ICD-10-CM | POA: Diagnosis not present

## 2022-06-21 LAB — BASIC METABOLIC PANEL
Anion gap: 9 (ref 5–15)
BUN: 25 mg/dL — ABNORMAL HIGH (ref 8–23)
CO2: 29 mmol/L (ref 22–32)
Calcium: 10.4 mg/dL — ABNORMAL HIGH (ref 8.9–10.3)
Chloride: 100 mmol/L (ref 98–111)
Creatinine, Ser: 1.68 mg/dL — ABNORMAL HIGH (ref 0.61–1.24)
GFR, Estimated: 42 mL/min — ABNORMAL LOW (ref >60)
Glucose, Bld: 106 mg/dL — ABNORMAL HIGH (ref 70–99)
Potassium: 3.7 mmol/L (ref 3.5–5.1)
Sodium: 138 mmol/L (ref 135–145)

## 2022-06-21 LAB — HEMOGLOBIN A1C
Hgb A1c MFr Bld: 6 % — ABNORMAL HIGH (ref 4.8–5.6)
Mean Plasma Glucose: 126 mg/dL

## 2022-06-21 NOTE — Plan of Care (Signed)
  Problem: Education: Goal: Knowledge of General Education information will improve Description: Including pain rating scale, medication(s)/side effects and non-pharmacologic comfort measures Outcome: Progressing   Problem: Health Behavior/Discharge Planning: Goal: Ability to manage health-related needs will improve Outcome: Progressing   Problem: Clinical Measurements: Goal: Respiratory complications will improve Outcome: Progressing   Problem: Clinical Measurements: Goal: Cardiovascular complication will be avoided Outcome: Progressing   Problem: Activity: Goal: Risk for activity intolerance will decrease Outcome: Progressing   Problem: Coping: Goal: Level of anxiety will decrease Outcome: Progressing   Problem: Pain Managment: Goal: General experience of comfort will improve Outcome: Progressing   Problem: Safety: Goal: Ability to remain free from injury will improve Outcome: Progressing   

## 2022-06-21 NOTE — Progress Notes (Signed)
Mobility Specialist - Progress Note   06/21/22 1633  Mobility  Activity Ambulated with assistance to bathroom  Level of Assistance Standby assist, set-up cues, supervision of patient - no hands on  Assistive Device Front wheel walker  Distance Ambulated (ft) 20 ft  Activity Response Tolerated well  Mobility Referral Yes  $Mobility charge 1 Mobility   Pt was received in bed and requesting assistance to BR. No complaints throughout. Pt was returned to bed with all needs met.  Franki Monte  Mobility Specialist Please contact via Solicitor or Rehab office at [phone]

## 2022-06-21 NOTE — Progress Notes (Signed)
Physical Therapy Treatment Patient Details Name: Johnny [name] MRN: [id number] DOB: 06-18-1946 Today's Date: 06/21/2022   History of Present Illness 76 y.o. male presents to ED 06/18/22 with ongoing SOB/DOE and mild chest pain. Chest x-ray showed no acute cardiopulmonary findings.  PMH: SSS s/p PPM (DOI 04/2021), paroxysmal AF, CAD s/p CABG, PCI, moderate AS, HFpEF, severe OSA, CKD3, DM2, HLD and HTN    PT Comments    Pt steady progressing with mobility. Today's session focused on transfers and stairs. Negotiated 4 stairs step to pattern with 1 hand rail, min guard-minA for steadying due to occasional posterior LOB. Frequent redirection to task necessary. Pt remains limited by generalized weakness, decreased activity tolerance, and impaired balance strategies/postural reactions. Continue to recommend acute PT services to maximize functional mobility and independence prior to d/c to HHPT    Recommendations for follow up therapy are one component of a multi-disciplinary discharge planning process, led by the attending physician.  Recommendations may be updated based on patient status, additional functional criteria and insurance authorization.  Follow Up Recommendations  Home health PT     Assistance Recommended at Discharge Set up Supervision/Assistance  Patient can return home with the following A little help with walking and/or transfers;Assistance with cooking/housework;Assist for transportation;Help with stairs or ramp for entrance   Equipment Recommendations  Rolling walker (2 wheels)    Recommendations for Other Services       Precautions / Restrictions Precautions Precautions: Fall Precaution Comments: reports 2 falls in last 6 months Restrictions Weight Bearing Restrictions: No     Mobility  Bed Mobility Overal bed mobility: Modified Independent Bed Mobility: Supine to Sit, Sit to Supine     Supine to sit: Min guard Sit to supine: Modified independent  (Device/Increase time)   General bed mobility comments: min guard supine to sit with cues to reduce strain and hernia-appearing structure on abdomen; mod ind to return to supine at end of session with use of single hand rail    Transfers Overall transfer level: Needs assistance Equipment used: 1 person hand held assist Transfers: Sit to/from Stand, Bed to chair/wheelchair/BSC Sit to Stand: Min guard   Step pivot transfers: Min assist       General transfer comment: step pivot to transfer chair, minA for steadying due to 1 minor posterior LOB; multiple sit to stands during session with cues for foot position prior to transfer    Ambulation/Gait               General Gait Details: deffered today to focus energy towards stair negotiaiton   Stairs Stairs: Yes Stairs assistance: Min guard, Min assist Stair Management: Step to pattern, One rail Left, Forwards Number of Stairs: 4 General stair comments: negotiate 4 steps with forward facing pattern, pt preference over sideways due to "seeing where I am going"; pt up to minA for steadying to prevent LOB; educated pt on energy conservation techniques when feeling dyspnea after mobility   Wheelchair Mobility    Modified Rankin (Stroke Patients Only)       Balance Overall balance assessment: Needs assistance Sitting-balance support: No upper extremity supported, Feet supported Sitting balance-Leahy Scale: Good Sitting balance - Comments: dynamic balance during line and gown mgmt   Standing balance support: During functional activity, No upper extremity supported, Single extremity supported Standing balance-Leahy Scale: Poor Standing balance comment: sustained standing balance required UE support  Cognition Arousal/Alertness: Awake/alert Behavior During Therapy: WFL for tasks assessed/performed Overall Cognitive Status: Within Functional Limits for tasks assessed                                  General Comments: continued to need consistent redirection during session due to persistent forward comments about male staff; pt preferred to sing during "quiet" time but responded well to interruption for safety        Exercises      General Comments General comments (skin integrity, edema, etc.): VSS on 2L, SpO2 >94% when reading accurate      Pertinent Vitals/Pain Pain Assessment Faces Pain Scale: Hurts a little bit Pain Location: low back and neck Pain Descriptors / Indicators: Guarding, Nagging Pain Intervention(s): Monitored during session    Home Living                          Prior Function            PT Goals (current goals can now be found in the care plan section) Acute Rehab PT Goals Patient Stated Goal: sing [place] PT Goal Formulation: With patient Time For Goal Achievement: 07/04/22 Potential to Achieve Goals: Fair Progress towards PT goals: Progressing toward goals    Frequency    Min 3X/week      PT Plan Current plan remains appropriate    Co-evaluation              AM-PAC PT "6 Clicks" Mobility   Outcome Measure  Help needed turning from your back to your side while in a flat bed without using bedrails?: A Little Help needed moving from lying on your back to sitting on the side of a flat bed without using bedrails?: A Little Help needed moving to and from a bed to a chair (including a wheelchair)?: A Little Help needed standing up from a chair using your arms (e.g., wheelchair or bedside chair)?: A Little Help needed to walk in hospital room?: A Little Help needed climbing 3-5 steps with a railing? : A Little 6 Click Score: 18    End of Session Equipment Utilized During Treatment: Gait belt;Oxygen Activity Tolerance: Patient limited by fatigue Patient left: in bed;with call bell/phone within reach Nurse Communication: Mobility status PT Visit Diagnosis: Unsteadiness on feet (R26.81);Other  abnormalities of gait and mobility (R26.89);Muscle weakness (generalized) (M62.81);Difficulty in walking, not elsewhere classified (R26.2)     Time: 9147-8295 PT Time Calculation (min) (ACUTE ONLY): 33 min  Charges:  $Gait Training: 8-22 mins $Self Care/Home Management: 8-22                     Johnny [name], SPT    Johnny [name] 06/21/2022, 2:46 PM
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
## 2022-06-21 NOTE — Care Management (Signed)
  Transition of Care [facility]) Screening Note   Patient Details  Name: Alassane Kalafut Date of Birth: [birth year]   Transition of Care [facility]) CM/SW Contact:    Gala Lewandowsky, RN Phone Number: 06/21/2022, 2:01 PM    Transition of Care Department [facility]) has reviewed the patient. Case Manager spoke with the patient this morning regarding disposition needs. Patient has family in [place]-currently is renting a room from an older couple. He has seven steps to get into the room. Patient is asking for an aide to be in the home. Patient is without -Medicaid and this is an out of pocket fee. Patient is not willing to pay for the services. Case Manager offered [facility] PT and a CSW to assist in navigating through the ALF placement-Patient declined when Case Manager sated that ALF is an out of pocket fee unless he has Medicaid. Patient began to get frustrated and asked Case Manager to leave the room. Declined all DME. Patient states he will drive back to [place]. Case Manager did make the PA aware of above information. No home needs have been arranged. Case Manager will continue to monitor patient advancement through interdisciplinary progression rounds. If new patient transition needs arise, please place a TOC consult.
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
## 2022-06-22 DIAGNOSIS — I25118 Atherosclerotic heart disease of native coronary artery with other forms of angina pectoris: Secondary | ICD-10-CM

## 2022-06-22 DIAGNOSIS — R079 Chest pain, unspecified: Secondary | ICD-10-CM | POA: Diagnosis not present

## 2022-06-22 DIAGNOSIS — I5032 Chronic diastolic (congestive) heart failure: Secondary | ICD-10-CM | POA: Diagnosis not present

## 2022-06-22 LAB — BASIC METABOLIC PANEL
Anion gap: 11 (ref 5–15)
BUN: 26 mg/dL — ABNORMAL HIGH (ref 8–23)
CO2: 27 mmol/L (ref 22–32)
Calcium: 10.5 mg/dL — ABNORMAL HIGH (ref 8.9–10.3)
Chloride: 101 mmol/L (ref 98–111)
Creatinine, Ser: 1.6 mg/dL — ABNORMAL HIGH (ref 0.61–1.24)
GFR, Estimated: 44 mL/min — ABNORMAL LOW (ref >60)
Glucose, Bld: 89 mg/dL (ref 70–99)
Potassium: 4 mmol/L (ref 3.5–5.1)
Sodium: 139 mmol/L (ref 135–145)

## 2022-06-22 MED ORDER — APIXABAN 5 MG PO TABS: 5.0000 mg | ORAL_TABLET | Freq: Two times a day (BID) | ORAL | 3 refills | Status: DC

## 2022-06-22 MED ORDER — FUROSEMIDE 40 MG PO TABS: 40.0000 mg | ORAL_TABLET | ORAL | 1 refills | Status: DC

## 2022-06-22 MED ORDER — LISINOPRIL 5 MG PO TABS
2.5000 mg | ORAL_TABLET | Freq: Every day | ORAL | Status: DC
  Administered 2022-06-22: 2.5 mg via ORAL
  Filled 2022-06-22: qty 1

## 2022-06-22 NOTE — Progress Notes (Signed)
CSW consulted to assist with transportation for patient. CSW met with patient at bedside who confirmed needs transportation home. CSW confirmed address.All questions answered. No further questions reported at this time.CSW provided [employer] to patients RN.

## 2022-06-22 NOTE — TOC Transition Note (Addendum)
Transition of Care [facility]) - CM/SW Discharge Note   Patient Details  Name: Johnny [name] MRN: [id number] Date of Birth: 1946/03/28  [facility] CM/SW Contact:  Gala Lewandowsky, RN Phone Number: 06/22/2022, 1:53 PM   Clinical Narrative: Patient had previously declined home health services- he now wants to have [facility] Services. Patient is agreeable to RN/PT/ CSW for the patient. Patient has no preference for home health agency- Case Manager called [facility] to see if they can services- awaiting call back. Patient has declined a rolling walker.    1351 06-22-22 [facility] will visit the patient on 06-28-22 for start of care. MD and Staff RN aware.   1531 [employer] delivering a portable tank to the patient. No further needs identified at this time.   Final next level of care: Home w Home Health Services Barriers to Discharge: No Barriers Identified   Patient Goals and CMS Choice Patient states their goals for this hospitalization and ongoing recovery are:: to return home.   Choice offered to / list presented to :  (patient did not have a preference.)  Discharge Plan and Services In-house Referral: NA Discharge Planning Services: CM Consult Post Acute Care Choice: Home Health          DME Arranged: N/A (declined rolling walker.) DME Agency: NA       HH Arranged: RN, Disease Management, PT, Social Work [employer] Agency: [facility] Date HH Agency Contacted: 06/22/22 Time HH Agency Contacted: 1300 Representative spoke with at [facility] Agency: [place] Readmission Risk Interventions     No data to display

## 2022-06-22 NOTE — Progress Notes (Signed)
Mobility Specialist - Progress Note   06/22/22 1029  Mobility  Activity Ambulated with assistance to bathroom  Level of Assistance Minimal assist, patient does 75% or more  Assistive Device Front wheel walker  Distance Ambulated (ft) 10 ft  Activity Response Tolerated well  Mobility Referral Yes  $Mobility charge 1 Mobility   Pt received in bed requesting to use BR. Pt was MinA to sit up in bed and SB for standing and ambulating to BR. Pt was left in BR with all needs met and instructed to pull call bell when finished.  Johnny [name]  Mobility Specialist Please contact via Solicitor or Rehab office at [phone]

## 2022-06-22 NOTE — Progress Notes (Signed)
Physical Therapy Treatment Patient Details Name: Johnny [name] MRN: [id number] DOB: 11/17/1945 Today's Date: 06/22/2022   History of Present Illness 76 y.o. male presents to ED 06/18/22 with ongoing SOB/DOE and mild chest pain. Chest x-ray showed no acute cardiopulmonary findings.  PMH: SSS s/p PPM (DOI 04/2021), paroxysmal AF, CAD s/p CABG, PCI, moderate AS, HFpEF, severe OSA, CKD3, DM2, HLD and HTN    PT Comments    Pt was seen for mobility on RW with help to manage balance and safety awareness.  Pt is easily distracted, talked about his work history, his interests and  discussion about singing.  He is motivated to work and should be able to make progress with strengthening and balance, with safety awareness and his quality of gait.  Follow acutely for goals of PT.  Recommendations for follow up therapy are one component of a multi-disciplinary discharge planning process, led by the attending physician.  Recommendations may be updated based on patient status, additional functional criteria and insurance authorization.  Follow Up Recommendations  Home health PT     Assistance Recommended at Discharge Set up Supervision/Assistance  Patient can return home with the following A little help with walking and/or transfers;Assistance with cooking/housework;Assist for transportation;Help with stairs or ramp for entrance   Equipment Recommendations  Rolling walker (2 wheels)    Recommendations for Other Services Other (comment)     Precautions / Restrictions Precautions Precautions: Fall Precaution Comments: reports 2 falls in last 6 months Restrictions Weight Bearing Restrictions: No     Mobility  Bed Mobility Overal bed mobility: Modified Independent                  Transfers Overall transfer level: Needs assistance Equipment used: None Transfers: Sit to/from Stand Sit to Stand: Min guard           General transfer comment: minor cues for hand placement     Ambulation/Gait Ambulation/Gait assistance: Min guard Gait Distance (Feet): 150 Feet Assistive device: Rolling walker (2 wheels) Gait Pattern/deviations: Step-through pattern, Decreased step length - right, Decreased step length - left, Trunk flexed, Shuffle Gait velocity: reduced Gait velocity interpretation: <1.31 ft/sec, indicative of household ambulator Pre-gait activities: standing balance ck     Stairs             Wheelchair Mobility    Modified Rankin (Stroke Patients Only)       Balance Overall balance assessment: Needs assistance Sitting-balance support: Feet supported Sitting balance-Leahy Scale: Good     Standing balance support: Bilateral upper extremity supported, During functional activity Standing balance-Leahy Scale: Fair Standing balance comment: less than fair dynamically                            Cognition Arousal/Alertness: Awake/alert Behavior During Therapy: Impulsive, WFL for tasks assessed/performed Overall Cognitive Status: Within Functional Limits for tasks assessed                                 General Comments: requires redirection for safety of walking on hallway to set limits and while singing as he is moving        Exercises      General Comments General comments (skin integrity, edema, etc.): pt is up to walk with help and controlled balance, noted distractions are a bit unsteadying      Pertinent Vitals/Pain Pain Assessment Pain Assessment: Faces Faces Pain  Scale: No hurt    Home Living                          Prior Function            PT Goals (current goals can now be found in the care plan section) Progress towards PT goals: Progressing toward goals    Frequency    Min 3X/week      PT Plan Current plan remains appropriate    Co-evaluation              AM-PAC PT "6 Clicks" Mobility   Outcome Measure  Help needed turning from your back to your side  while in a flat bed without using bedrails?: A Little Help needed moving from lying on your back to sitting on the side of a flat bed without using bedrails?: A Little Help needed moving to and from a bed to a chair (including a wheelchair)?: A Little Help needed standing up from a chair using your arms (e.g., wheelchair or bedside chair)?: A Little Help needed to walk in hospital room?: A Little Help needed climbing 3-5 steps with a railing? : A Little 6 Click Score: 18    End of Session Equipment Utilized During Treatment: Gait belt;Oxygen Activity Tolerance: Patient limited by fatigue Patient left: in bed;with call bell/phone within reach Nurse Communication: Mobility status PT Visit Diagnosis: Unsteadiness on feet (R26.81);Other abnormalities of gait and mobility (R26.89);Muscle weakness (generalized) (M62.81);Difficulty in walking, not elsewhere classified (R26.2)     Time: 4174-0814 PT Time Calculation (min) (ACUTE ONLY): 23 min  Charges:  $Gait Training: 8-22 mins $Therapeutic Activity: 8-22 mins     Ivar Drape 06/22/2022, 3:32 PM  Samul Dada, PT PhD Acute Rehab Dept. Number: [facility] [id number] and [facility] [phone]

## 2022-06-22 NOTE — Progress Notes (Addendum)
Rounding Note    Patient Name: Johnny [name] Date of Encounter: 06/22/2022  [facility] Cardiologist: Little Ishikawa, MD   Subjective   Has chronic chest pain for years, currently 4/10 in intensity. On 2 L/min [place] at home  Inpatient Medications    Scheduled Meds:  apixaban  5 mg Oral BID   carvedilol  6.25 mg Oral BID WC   famotidine  40 mg Oral Daily   influenza vaccine adjuvanted  0.5 mL Intramuscular Tomorrow-1000   isosorbide mononitrate  30 mg Oral Daily   pantoprazole  40 mg Oral QAC breakfast   rosuvastatin  40 mg Oral Daily   venlafaxine XR  150 mg Oral Q breakfast   Continuous Infusions:  PRN Meds: acetaminophen, melatonin, nitroGLYCERIN, ondansetron (ZOFRAN) IV   Vital Signs    Vitals:   06/22/22 0459 06/22/22 0502 06/22/22 0512 06/22/22 0736  BP:  (!) 163/105 (!) 145/101 (!) 166/94  Pulse:  60 (!) 59 (!) 59  Resp:      Temp:  (!) 97.5 F (36.4 C)    TempSrc:  Oral    SpO2:  96%    Weight: 97.4 kg     Height:        Intake/Output Summary (Last 24 hours) at 06/22/2022 0851 Last data filed at 06/22/2022 6301 Gross per 24 hour  Intake 760 ml  Output 550 ml  Net 210 ml      06/22/2022    4:59 AM 06/19/2022    3:18 PM 06/18/2022    3:40 PM  Last 3 Weights  Weight (lbs) 214 lb 11.2 oz 210 lb 8.6 oz 220 lb  Weight (kg) 97.387 kg 95.5 kg 99.791 kg      Telemetry    Atrial paced rhythm - Personally Reviewed  ECG    Atrial paced rhythm - Personally Reviewed  Physical Exam   GEN: No acute distress.   Neck: No JVD Cardiac: RRR, no murmurs, rubs, or gallops.  Respiratory: Clear to auscultation bilaterally. GI: Soft, nontender, non-distended  MS: No edema; No deformity. Neuro:  Nonfocal  Psych: Normal affect   Labs    High Sensitivity Troponin:   Recent Labs  Lab 06/18/22 1544 06/18/22 2150  TROPONINIHS 34* 35*     Chemistry Recent Labs  Lab 06/20/22 0843 06/21/22 0214 06/22/22 0337  NA 139 138 139  K  3.8 3.7 4.0  CL 99 100 101  CO2 28 29 27   GLUCOSE 110* 106* 89  BUN 27* 25* 26*  CREATININE 1.82* 1.68* 1.60*  CALCIUM 10.3 10.4* 10.5*  GFRNONAA 38* 42* 44*  ANIONGAP 12 9 11     Lipids  Recent Labs  Lab 06/19/22 1519  CHOL 148  TRIG 247*  HDL 59  LDLCALC 40  CHOLHDL 2.5    Hematology Recent Labs  Lab 06/18/22 1544  WBC 7.9  RBC 5.91*  HGB 16.7  HCT 51.0  MCV 86.3  MCH 28.3  MCHC 32.7  RDW 18.1*  PLT 200   Thyroid No results for input(s): "TSH", "FREET4" in the last 168 hours.  BNP Recent Labs  Lab 06/18/22 1700  BNP 65.9    DDimer No results for input(s): "DDIMER" in the last 168 hours.   Radiology    No results found.  Cardiac Studies   Echo 06/19/2022  1. Left ventricular ejection fraction, by estimation, is 65 to 70%. The  left ventricle has normal function. The left ventricle has no regional  wall motion abnormalities. Left  ventricular diastolic parameters are  indeterminate.   2. Right ventricular systolic function is normal. The right ventricular  size is normal. Tricuspid regurgitation signal is inadequate for assessing  PA pressure.   3. The mitral valve is normal in structure. Trivial mitral valve  regurgitation. No evidence of mitral stenosis. Moderate mitral annular  calcification.   4. The aortic valve is tricuspid. There is moderate calcification of the  aortic valve. There is moderate thickening of the aortic valve. Aortic  valve regurgitation is not visualized. Moderate aortic valve stenosis.  Aortic valve area, by VTI measures  1.17 cm. Aortic valve Vmax measures 2.87 m/s. Aortic valve mean gradient  measures 17.0 mmHg. Aortic valve peak gradient measures 32.9      Although the mean AVG and peak velocity are in the mild range, the DVI  is low at 0.37 and SVI is low at 24. 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   5. There is borderline dilatation of the ascending aorta, measuring 37  mm.   6. The inferior  vena cava is normal in size with greater than 50%  respiratory variability, suggesting right atrial pressure of 3 mmHg.   7. Compared to prior study dated 12/15/2021, mean AVG has decreased from  22.47mmHg to but AVA has decreased from 1.23cm2 to 1.17cm2, SVI has  dropped from 32 to 24 but DVI is the same at 0.37. Overall no significant  change in degree of AS.    Patient Profile     76 y.o. male with PMH of CAD s/p CABG 2015, HFpEF, PAF, SSS s/p PPM 04/2021, moderate aortic stenosis, HTN, HLD, DM II, CKD stage III and OSA not on CPAP/BiPAP who was admitted with SOB and chest pain  Assessment & Plan    DOE/Chest pain  - Echo 06/19/2022 EF 65-70%, no RWMA, moderate AS, 37 mm borderline dilatation of ascending aorta.   - serial trop flat 34-->35, not consistent with ACS. No further ischemic work up planned. Will discuss with MD and case manager, plan to d/c afterward. Patient mentions possibly going to [facility] or [place]. No children, wife passed away in [place]. Sister lives in [place].    Addendum: spoke with case manager, cost of ALF is $5000-9000 per month which is cost prohibitive.  There is also a out of pocket cost for home aide as well.   CAD s/p CABG 2015   - previous cath 06/2021 per outside records showed occluded SVG to mid OM2 with patent stents of native OM2, patent LIMA-LAD with filling defect in the distal LAD which was too small for PCI, CTO of distal LCx with collaterals and moderate disease in D1 and D2.   - Myoview 11/2021 showed no evidence of ischemia  PAF: ASA stopped, restart Eliquis  - device interrogation showed no afib, home amiodarone has been stopped.   Aortic stenosis: Echo obtained during this admission showed possible paradoxical low flow low gradient aortic stenosis  SSS s/p PPM 04/2021: device interrogated during this admission.   Chronic respiratory failure: chronic O2 at home, will need to reestablish with pulmonology service  HTN:  BP elevated, consider restart lisinopril.   HLD: continue crestor  DM II  OSA: will need in-lab study per Dr. Mayford Knife  Social concern: initially wanted to consider assisted living facility placement, however patient does not have medicaid and has out of pocket fee associated with ALF placement. He later declined ALF placement  For questions or  updates, please contact [facility] Please consult [email or web address] for contact info under        Signed, Azalee Course, PA  06/22/2022, 8:51 AM     Patient seen and examined.  Agree with above documentation.  On exam, patient is alert and oriented, regular rate and rhythm, no murmurs, lungs CTAB, no LE edema or JVD.  BP elevated, renal function improved, will restart lisinopril. Stable for discharge today, will discharge with home health.  Little Ishikawa, MD

## 2022-06-22 NOTE — Discharge Summary (Signed)
Discharge Summary    Patient ID: Johnny [name] MRN: [id number]; DOB: May 17, 1946  Admit date: 06/18/2022 Discharge date: 06/22/2022  PCP:  Caesar Bookman, NP   [facility] Providers Cardiologist:  Little Ishikawa, MD        Discharge Diagnoses    Principal Problem:   Chest pain Active Problems:   DM type 2 with diabetic peripheral neuropathy (HCC)   Mixed hyperlipidemia   A-fib (HCC)   OSA (obstructive sleep apnea)   Chronic diastolic HF (heart failure) (HCC)   Coronary artery disease involving native coronary artery of native heart   AKI (acute kidney injury) [facility])   Essential hypertension   Aortic stenosis   Chronic respiratory failure with hypoxia (HCC)   Chronic heart failure with preserved ejection fraction (HFpEF) (HCC)    Diagnostic Studies/Procedures    Echo 06/19/2022 1. Left ventricular ejection fraction, by estimation, is 65 to 70%. The  left ventricle has normal function. The left ventricle has no regional  wall motion abnormalities. Left ventricular diastolic parameters are  indeterminate.   2. Right ventricular systolic function is normal. The right ventricular  size is normal. Tricuspid regurgitation signal is inadequate for assessing  PA pressure.   3. The mitral valve is normal in structure. Trivial mitral valve  regurgitation. No evidence of mitral stenosis. Moderate mitral annular  calcification.   4. The aortic valve is tricuspid. There is moderate calcification of the  aortic valve. There is moderate thickening of the aortic valve. Aortic  valve regurgitation is not visualized. Moderate aortic valve stenosis.  Aortic valve area, by VTI measures  1.17 cm. Aortic valve Vmax measures 2.87 m/s. Aortic valve mean gradient  measures 17.0 mmHg. Aortic valve peak gradient measures 32.9      Although the mean AVG and peak velocity are in the mild range, the DVI  is low at 0.37 and SVI is low at 24. Findings consistent with  paradoxical  low flow low gradient AS.   5. There is borderline dilatation of the ascending aorta, measuring 37  mm.   6. The inferior vena cava is normal in size with greater than 50%  respiratory variability, suggesting right atrial pressure of 3 mmHg.   7. Compared to prior study dated 12/15/2021, mean AVG has decreased from  22.11mmHg to but AVA has decreased from 1.23cm2 to 1.17cm2, SVI has  dropped from 32 to 24 but DVI is the same at 0.37. Overall no significant  change in degree of AS.   _____________   History of Present Illness     Johnny [name] is a 76 y.o. male with SSS s/p BSCI DC PPM (DOI 04/2021), paroxysmal AF, CAD s/p CABG, PCI, moderate AS, HFpEF, severe OSA, CKD3, DM2, HLD and HTN who presents with ongoing SOB/DOE and mild chest pain.   Mr. Consiglio reports gradual onset dyspnea on exertion over the past several months which is worsened along with mild chest pain.  He feels like he cannot catch his breath and over the past 2 months has had similar symptoms.  He was started on home oxygen around 2 months ago however has a very distant smoking history of around 15 pack years over 40 years ago.  His prior anginal equivalents are different than his current symptoms.  In the past he felt that someone heavy was "doing a dance on his chest" and it was more consistent with chest pressure and dyspnea.    He is originally from [place] and  relocated to [place], [place] after his wife passed 7 years ago.  Fortunately he had some issues with the girlfriend and lost money so he moved to [place] and currently rents part of a condo/apartment.   He was last seen by Dr. Royann Shivers on 02/21/22 at that time denied any chest pain, DOE, orthopnea, PND, syncope, palpitations, or other anginal equivalents.  He apparently had some prior issue with superficial left infraclavicular infection requiring antibiotics.  There is a large scar with fat atrophy but otherwise no focal evidence of chronic  infection.  During that visit he was AP 43% and VP 0% without recorded AF since device implant.  His underlying rhythm was sinus bradycardia (HR 40 with 1:1 conduction).  He was continued on amiodarone for AF control and was prescribed apixaban for stroke risk reduction however has not been taking this for several years.  He said some doctor told him to stop taking it but he was unsure why.  He has not had significant bleeding issues but has had intermittent epistaxis not requiring transfusion or medical evaluation.  No changes were made at that appointment.   On 11/18 he was brought in by [facility] for intermittent CP that was similar to his prior heart attack (although his report is that it is different).  He was given aspirin 324 mg en route.    VS on ED evaluation: P 60, BP 130/111, T 97.7, RR 19, O2 98%/RA   Labs all WNLs other than mild AKI (sCr 1.87) on CKD (bl sCR 1.4-1.7) with minimally elevated but flat hsT (34->35). BNP nl (65).    VS during my evaluation: P 62, BP 145/76, RR 12, O2 98%/2L [place]    Mr. Reier reported mild to moderate chest discomfort a 5/10 severity without radiation.  He said earlier in the day it was between 8-10/10 severity with some improvement with SL NG.  He was not taking prescribed Imdur as he said that his BP was low and another doctor told him to stop it. His sx feel like a "gradual ocean of both DOE and chest pain." He also has pain over his PPM site which is chronic. Minor chores around the house have been resulting in DOE. He does use 2 pillows to sleep at night although he denies PND, orthopnea, or significant LE edema.   Hospital Course     Consultants: N/A   Patient was admitted to cardiology service for chest pain and dyspnea on exertion.  He was also found to have AKI with serum creatinine 1.87.  0 troponin was borderline elevated at the 34-->35.  Echocardiogram obtained on 06/19/2022 showed EF 65 to 70%, trivial MR, no regional wall motion abnormality,  moderate aortic stenosis, finding consistent with paradoxical low-flow low-gradient aortic stenosis, borderline dilated ascending aorta measuring 37 mm.  His chest pain is chronic in nature and has been going on for years.  Lisinopril and Lasix were initially held due to elevated creatinine.  Lisinopril was later resumed on 11/22.  Instead of taking Lasix every other day, patient was taking Lasix every day, this is likely contributing to the AKI.  Amiodarone was stopped during this hospitalization as the patient has been maintaining sinus rhythm and there was concern that amiodarone was contributing to his shortness of breath.  Social issues remain a concern as the patient ideally should have home health aide, however he does not have any children and the closest relative is his sister in [place].  We discussed with our case  manager, the cost of assisted living facility is prohibitive to this patient.  Patient was seen in the morning of 06/22/2022 at which time he continued to have persistent chest pain.  He is on 2 L nasal cannula at home.  During this hospitalization, we have discontinued his aspirin and restarted on Eliquis.  He was resumed on low-dose lisinopril after renal function improved.  He will be discharged on Lasix every other day instead of daily dosing.  He eventually was agreeable to consider going home with home health.  I have ordered home health PT, nurse and social worker.      Did the patient have an acute coronary syndrome (MI, NSTEMI, STEMI, etc) this admission?:  No                               Did the patient have a percutaneous coronary intervention (stent / angioplasty)?:  No.          _____________  Discharge Vitals Blood pressure 115/86, pulse 61, temperature 97.8 F (36.6 C), temperature source Oral, resp. rate 19, height 5\' 6"  (1.676 m), weight 97.4 kg, SpO2 97 %.  Filed Weights   06/18/22 1540 06/19/22 1518 06/22/22 0459  Weight: 99.8 kg 95.5 kg 97.4 kg     Labs & Radiologic Studies    CBC No results for input(s): "WBC", "NEUTROABS", "HGB", "HCT", "MCV", "PLT" in the last 72 hours. Basic Metabolic Panel Recent Labs    06/24/22 0214 06/22/22 0337  NA 138 139  K 3.7 4.0  CL 100 101  CO2 29 27  GLUCOSE 106* 89  BUN 25* 26*  CREATININE 1.68* 1.60*  CALCIUM 10.4* 10.5*   Liver Function Tests No results for input(s): "AST", "ALT", "ALKPHOS", "BILITOT", "PROT", "ALBUMIN" in the last 72 hours. No results for input(s): "LIPASE", "AMYLASE" in the last 72 hours. High Sensitivity Troponin:   Recent Labs  Lab 06/18/22 1544 06/18/22 2150  TROPONINIHS 34* 35*    BNP Invalid input(s): "POCBNP" D-Dimer No results for input(s): "DDIMER" in the last 72 hours. Hemoglobin A1C Recent Labs    06/19/22 1519  HGBA1C 6.0*   Fasting Lipid Panel Recent Labs    06/19/22 1519  CHOL 148  HDL 59  LDLCALC 40  TRIG 247*  CHOLHDL 2.5   Thyroid Function Tests No results for input(s): "TSH", "T4TOTAL", "T3FREE", "THYROIDAB" in the last 72 hours.  Invalid input(s): "FREET3" _____________  ECHOCARDIOGRAM COMPLETE  Result Date: 06/19/2022    ECHOCARDIOGRAM REPORT   Patient Name:   DELROY ORDWAY Date of Exam: 06/19/2022 Medical Rec #:  06/21/2022     Height:       66.0 in Accession #:    [id number]    Weight:       220.0 lb Date of Birth:  [birth year]    BSA:          2.082 m Patient Age:    76 years      BP:           136/115 mmHg Patient Gender: M             HR:           60 bpm. Exam Location:  Inpatient Procedure: 2D Echo, Color Doppler, Cardiac Doppler and Intracardiac            Opacification Agent Indications:    Aortic Stenosis i35.0  History:        Patient  has prior history of Echocardiogram examinations, most                 recent 12/15/2021. Risk Factors:Diabetes, Dyslipidemia,                 Hypertension and Sleep Apnea.  Sonographer:    Irving BurtonEmily Senior RDCS Referring Phys: [id number] MATTHEW A CARLISLE  Sonographer Comments: Technically  difficult due to lung interference IMPRESSIONS  1. Left ventricular ejection fraction, by estimation, is 65 to 70%. The left ventricle has normal function. The left ventricle has no regional wall motion abnormalities. Left ventricular diastolic parameters are indeterminate.  2. Right ventricular systolic function is normal. The right ventricular size is normal. Tricuspid regurgitation signal is inadequate for assessing PA pressure.  3. The mitral valve is normal in structure. Trivial mitral valve regurgitation. No evidence of mitral stenosis. Moderate mitral annular calcification.  4. The aortic valve is tricuspid. There is moderate calcification of the aortic valve. There is moderate thickening of the aortic valve. Aortic valve regurgitation is not visualized. Moderate aortic valve stenosis. Aortic valve area, by VTI measures 1.17 cm. Aortic valve Vmax measures 2.87 m/s. Aortic valve mean gradient measures 17.0 mmHg. Aortic valve peak gradient measures 32.9     Although the mean AVG and peak velocity are in the mild range, the DVI is low at 0.37 and SVI is low at 24. 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  5. There is borderline dilatation of the ascending aorta, measuring 37 mm.  6. The inferior vena cava is normal in size with greater than 50% respiratory variability, suggesting right atrial pressure of 3 mmHg.  7. Compared to prior study dated 12/15/2021, mean AVG has decreased from 22.694mmHg to 17mmHg but AVA has decreased from 1.23cm2 to 1.17cm2, SVI has dropped from 32 to 24 but DVI is the same at 0.37. Overall no significant change in degree of AS. FINDINGS  Left Ventricle: Left ventricular ejection fraction, by estimation, is 65 to 70%. The left ventricle has normal function. The left ventricle has no regional wall motion abnormalities. Definity contrast agent was given IV to delineate the left ventricular  endocardial borders. The left ventricular internal cavity size was normal in  size. There is no concentric left ventricular hypertrophy. Left ventricular diastolic parameters are indeterminate. Normal left ventricular filling pressure. Right Ventricle: The right ventricular size is normal. No increase in right ventricular wall thickness. Right ventricular systolic function is normal. Tricuspid regurgitation signal is inadequate for assessing PA pressure. Left Atrium: Left atrial size was normal in size. Right Atrium: Right atrial size was normal in size. Pericardium: There is no evidence of pericardial effusion. Mitral Valve: The mitral valve is normal in structure. Moderate mitral annular calcification. Trivial mitral valve regurgitation. No evidence of mitral valve stenosis. Tricuspid Valve: The tricuspid valve is normal in structure. Tricuspid valve regurgitation is not demonstrated. No evidence of tricuspid stenosis. Aortic Valve: The aortic valve is tricuspid. There is moderate calcification of the aortic valve. There is moderate thickening of the aortic valve. Aortic valve regurgitation is not visualized. Moderate aortic stenosis is present. Aortic valve mean gradient measures 17.0 mmHg. Aortic valve peak gradient measures 32.9 mmHg. Aortic valve area, by VTI measures 1.17 cm. Pulmonic Valve: The pulmonic valve was normal in structure. Pulmonic valve regurgitation is not visualized. No evidence of pulmonic stenosis. Aorta: The aortic root is normal in size and structure. There is borderline dilatation of the ascending aorta, measuring 37 mm. Venous: The  inferior vena cava is normal in size with greater than 50% respiratory variability, suggesting right atrial pressure of 3 mmHg. IAS/Shunts: No atrial level shunt detected by color flow Doppler.  LEFT VENTRICLE PLAX 2D LVIDd:         3.90 cm   Diastology LVIDs:         2.30 cm   LV e' medial:    4.68 cm/s LV PW:         1.20 cm   LV E/e' medial:  11.6 LV IVS:        1.10 cm   LV e' lateral:   6.20 cm/s LVOT diam:     2.00 cm   LV E/e'  lateral: 8.7 LV SV:         51 LV SV Index:   24 LVOT Area:     3.14 cm  RIGHT VENTRICLE RV S prime:     7.18 cm/s TAPSE (M-mode): 1.4 cm LEFT ATRIUM           Index        RIGHT ATRIUM           Index LA diam:      3.80 cm 1.82 cm/m   RA Area:     13.10 cm LA Vol (A2C): 34.1 ml 16.37 ml/m  RA Volume:   26.20 ml  12.58 ml/m LA Vol (A4C): 51.9 ml 24.92 ml/m  AORTIC VALVE AV Area (Vmax):    0.96 cm AV Area (Vmean):   1.00 cm AV Area (VTI):     1.17 cm AV Vmax:           287.00 cm/s AV Vmean:          195.000 cm/s AV VTI:            0.434 m AV Peak Grad:      32.9 mmHg AV Mean Grad:      17.0 mmHg LVOT Vmax:         87.90 cm/s LVOT Vmean:        62.200 cm/s LVOT VTI:          0.162 m LVOT/AV VTI ratio: 0.37  AORTA Ao Root diam: 3.20 cm Ao Asc diam:  3.70 cm MITRAL VALVE MV Area (PHT): 2.27 cm    SHUNTS MV Decel Time: 334 msec    Systemic VTI:  0.16 m MV E velocity: 54.10 cm/s  Systemic Diam: 2.00 cm MV A velocity: 49.40 cm/s MV E/A ratio:  1.10 Armanda Magic MD Electronically signed by Armanda Magic MD Signature Date/Time: 06/19/2022/2:02:30 PM    Final    DG Chest 2 View  Result Date: 06/18/2022 CLINICAL DATA:  Chest pain EXAM: CHEST - 2 VIEW COMPARISON:  05/02/22 CXR FINDINGS: Status post median sternotomy with fractured sternotomy wires, unchanged prior. Left-sided dual lead cardiac device in place unchanged lead positioning. Left shoulder arthroplasty. Partially visualized lumbar spinal fusion hardware with disc spacers place. One of the screws of the degenerative intermediate fractured. No pleural effusion. No pneumothorax. Unchanged cardiac and mediastinal contours. No focal airspace opacity. No displaced rib fracture. Visualized upper abdomen is unremarkable. Vertebral body heights are maintained. IMPRESSION: 1.  No radiographic finding to explain chest pain. 2. Lumbar spinal fusion hardware in place with possible fracture of one of the screws. Age indeterminate. Recommend further evaluation with a  dedicated lumbar spine radiograph. Electronically Signed   By: Lorenza Cambridge M.D.   On: 06/18/2022 16:26   Disposition   Pt is  being discharged home today in good condition.  Follow-up Plans & Appointments     Follow-up Information     Little Ishikawa, MD Follow up on 07/08/2022.   Specialties: Cardiology, Radiology Why: 8:40AM. Cardiology follow up Contact information: [address] [place] [address] [phone]                   Discharge Medications   Allergies as of 06/22/2022       Reactions   Acacia Itching   Metformin And Related Other (See Comments)   Metformin caused the patient's legs to become weak        Medication List     STOP taking these medications    amiodarone 200 MG tablet Commonly known as: PACERONE   aspirin EC 81 MG tablet       TAKE these medications    albuterol 108 (90 Base) MCG/ACT inhaler Commonly known as: VENTOLIN HFA Inhale 1 puff into the lungs every 6 (six) hours as needed for wheezing or shortness of breath.   apixaban 5 MG Tabs tablet Commonly known as: ELIQUIS Take 1 tablet (5 mg total) by mouth 2 (two) times daily.   carvedilol 6.25 MG tablet Commonly known as: COREG Take 1 tablet (6.25 mg total) by mouth 2 (two) times daily.   famotidine 40 MG tablet Commonly known as: PEPCID Take 40 mg by mouth daily.   fluticasone 50 MCG/ACT nasal spray Commonly known as: FLONASE Place 2 sprays into both nostrils 3 (three) times daily as needed for allergies or rhinitis.   furosemide 40 MG tablet Commonly known as: LASIX Take 1 tablet (40 mg total) by mouth every other day. What changed: when to take this   isosorbide mononitrate 30 MG 24 hr tablet Commonly known as: IMDUR Take 1 tablet (30 mg total) by mouth daily.   lisinopril 2.5 MG tablet Commonly known as: ZESTRIL Take 2.5 mg by mouth daily.   loratadine 10 MG tablet Commonly known as: CLARITIN Take 1 tablet (10 mg total)  by mouth daily for 14 days.   montelukast 10 MG tablet Commonly known as: SINGULAIR Take 10 mg by mouth daily as needed (for allergies).   nitroGLYCERIN 0.4 MG SL tablet Commonly known as: NITROSTAT Place 0.4 mg under the tongue every 5 (five) minutes as needed for chest pain.   pantoprazole 40 MG tablet Commonly known as: PROTONIX Take 1 tablet (40 mg total) by mouth daily before breakfast.   rosuvastatin 40 MG tablet Commonly known as: CRESTOR Take 1 tablet (40 mg total) by mouth daily.   Tylenol 8 Hour Arthritis Pain 650 MG CR tablet Generic drug: acetaminophen Take 650-1,300 mg by mouth every 8 (eight) hours as needed for pain.   venlafaxine XR 150 MG 24 hr capsule Commonly known as: EFFEXOR-XR Take 1 capsule (150 mg total) by mouth daily with breakfast.           Outstanding Labs/Studies   N/A  Duration of Discharge Encounter   Greater than 30 minutes including physician time.  Ramond Dial, PA 06/22/2022, 1:15 PM

## 2022-06-22 NOTE — Progress Notes (Signed)
Paged by the nurse as patient wanted a cardiac catheterization.  I spoke with Dr. Bjorn Pippin regarding this matter this morning, given the change in event, I spoke with Dr. Bjorn Pippin again, given his underlying CKD stage III, the risk of cardiac catheterization outweigh the potential benefit at this time.  I have arranged close follow-up with Dr. Bjorn Pippin for Mr. Johnny [name] in 2 weeks.

## 2022-06-28 ENCOUNTER — Telehealth: Payer: Self-pay

## 2022-06-28 NOTE — Telephone Encounter (Signed)
[name] w/ [facility] home [facility] was called and verbal order was given.

## 2022-06-28 NOTE — Telephone Encounter (Signed)
[name] home health called stating patient was just discharged from the hospital and she would like a Child psychotherapist to come out to assess the patient for possible home health services. Also LeAnne would like to see patient once a week for four weeks. Please advise.

## 2022-06-28 NOTE — Telephone Encounter (Signed)
Okay to give verbal orders as requested. 

## 2022-07-01 ENCOUNTER — Telehealth: Payer: Self-pay | Admitting: *Deleted

## 2022-07-01 NOTE — Telephone Encounter (Signed)
LeJe with [facility] called requesting verbal order for Home Health 1x1week,2x3week and 1x3weeks.   Verbal orders given.

## 2022-07-05 DIAGNOSIS — N179 Acute kidney failure, unspecified: Secondary | ICD-10-CM | POA: Diagnosis not present

## 2022-07-05 DIAGNOSIS — J9611 Chronic respiratory failure with hypoxia: Secondary | ICD-10-CM | POA: Diagnosis not present

## 2022-07-05 DIAGNOSIS — N183 Chronic kidney disease, stage 3 unspecified: Secondary | ICD-10-CM | POA: Diagnosis not present

## 2022-07-05 DIAGNOSIS — I5032 Chronic diastolic (congestive) heart failure: Secondary | ICD-10-CM | POA: Diagnosis not present

## 2022-07-05 DIAGNOSIS — E1122 Type 2 diabetes mellitus with diabetic chronic kidney disease: Secondary | ICD-10-CM | POA: Diagnosis not present

## 2022-07-05 DIAGNOSIS — I130 Hypertensive heart and chronic kidney disease with heart failure and stage 1 through stage 4 chronic kidney disease, or unspecified chronic kidney disease: Secondary | ICD-10-CM | POA: Diagnosis not present

## 2022-07-07 DIAGNOSIS — R053 Chronic cough: Secondary | ICD-10-CM | POA: Diagnosis not present

## 2022-07-07 DIAGNOSIS — N179 Acute kidney failure, unspecified: Secondary | ICD-10-CM | POA: Diagnosis not present

## 2022-07-07 DIAGNOSIS — N183 Chronic kidney disease, stage 3 unspecified: Secondary | ICD-10-CM | POA: Diagnosis not present

## 2022-07-07 DIAGNOSIS — I130 Hypertensive heart and chronic kidney disease with heart failure and stage 1 through stage 4 chronic kidney disease, or unspecified chronic kidney disease: Secondary | ICD-10-CM | POA: Diagnosis not present

## 2022-07-07 DIAGNOSIS — E1122 Type 2 diabetes mellitus with diabetic chronic kidney disease: Secondary | ICD-10-CM | POA: Diagnosis not present

## 2022-07-07 DIAGNOSIS — J9611 Chronic respiratory failure with hypoxia: Secondary | ICD-10-CM | POA: Diagnosis not present

## 2022-07-07 DIAGNOSIS — I5032 Chronic diastolic (congestive) heart failure: Secondary | ICD-10-CM | POA: Diagnosis not present

## 2022-07-07 NOTE — Progress Notes (Deleted)
Cardiology Office Note:    Date:  07/07/2022   ID:  Johnny [name], DOB [birth year], MRN [id number]  PCP:  Caesar Bookman, NP  Cardiologist:  Little Ishikawa, MD  Electrophysiologist:  None   Referring MD: Caesar Bookman, NP   No chief complaint on file.   History of Present Illness:    Johnny [name] is a 76 y.o. male with a hx of CAD status post CABG in 2015, moderate aortic stenosis, diastolic heart failure, OSA, atrial fibrillation, symptomatic bradycardia status post PPM who presents for  follow-up.  He was admitted 11/2021 with chest pain.  He had moved from [place] in 09/2021.  Echocardiogram 12/15/2021 showed EF 55 to 60%, low normal RV function, mild to moderate left atrial enlargement, small pericardial effusion, moderate aortic stenosis.  Lexiscan Myoview on 12/16/2021 showed no ischemia or infarction, EF 58%.  He was admitted 06/2022 with chest pain.  Troponins 34 > 35.  Echocardiogram 06/19/2022 showed EF 65 to 70%, moderate aortic stenosis.  He was found to have AKI with creatinine 1.87, lisinopril and Lasix were initially held.  Lisinopril was resumed on discharge and Lasix was changed to every other day.  Amiodarone was stopped as he has been maintaining sinus rhythm.  Since discharge from the hospital,  ***BiPAP he reports that he is doing better.  He states he has not had any recent chest pain.  Has been having shortness of breath, improves with inhaler use.  Denies any lightheadedness, syncope, or palpitations.  Reports weight has increased, up 10 pounds on home scale.  Had episode of epistaxis last night, otherwise denies any bleeding issues.   Wt Readings from Last 3 Encounters:  06/22/22 214 lb 11.2 oz (97.4 kg)  05/02/22 223 lb 12.8 oz (101.5 kg)  04/05/22 220 lb (99.8 kg)     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[place], [place]     Past Surgical History:  Procedure Laterality Date   BACK SURGERY  1975   Lower Back Surgery, Dr.Seltzer   BACK SURGERY  1985   Dr.Seltzer   SHOULDER SURGERY Left 1965   Dr.Seltzer   TONSILLECTOMY  1950   Dr.Perellie    Current Medications: No outpatient medications have been marked as taking for the 07/08/22 encounter (Appointment) with Little Ishikawa, MD.     Allergies:   Acacia and Metformin and related   Social History   Socioeconomic History   Marital status: Widowed    Spouse name: Not on file   Number of children: Not on file   Years of education: Not on file   Highest education level: Not on file  Occupational History   Not on file  Tobacco Use   Smoking status: Former    Packs/day: 1.00    Years: 10.00    Total pack years: 10.00    Types: Cigarettes   Smokeless tobacco: Never  Substance and Sexual Activity   Alcohol use: Never   Drug use: Never   Sexual activity: Not on file  Other Topics Concern   Not on file  Social History Narrative   Tobacco use, amount per day now: None   Past tobacco use, amount per day: 1 pack   How many years did you use tobacco: 10 years.   Alcohol use (drinks per week): None   Diet:  Do you drink/eat things with caffeine: Yes   Marital status:  Widow                                What year were you married? 1972   Do you live in a house, apartment, assisted living, condo, trailer, etc.? House   Is it one or more stories? One   How many persons live in your home? One   Do you have pets in your home?( please list) No   Highest Level of education completed? 12th grade.   Current or past profession: Psychologist, occupational, Investment banker, operational, Clinical biochemist.    Do you exercise?   No                               Type and how often?   Do you have a living will? Yes   Do you have a DNR form?     No                              If not, do you want to discuss one?   Do you have signed POA/HPOA forms?  Yes                      If so, please  bring to you appointment      Do you have any difficulty bathing or dressing yourself? No   Do you have any difficulty preparing food or eating? No   Do you have any difficulty managing your medications? No   Do you have any difficulty managing your finances? Yes   Do you have any difficulty affording your medications? No   Social Determinants of Health   Financial Resource Strain: Not on file  Food Insecurity: No Food Insecurity (06/19/2022)   Hunger Vital Sign    Worried About Running Out of Food in the Last Year: Never true    Ran Out of Food in the Last Year: Never true  Transportation Needs: No Transportation Needs (06/19/2022)   PRAPARE - Administrator, Civil Service (Medical): No    Lack of Transportation (Non-Medical): No  Physical Activity: Not on file  Stress: Not on file  Social Connections: Not on file     Family History: The patient's family history includes Cancer in his father; Stroke in his brother and sister.  ROS:   Please see the history of present illness.     All other systems reviewed and are negative.  EKGs/Labs/Other Studies Reviewed:    The following studies were reviewed today:   EKG:   12/29/2021: Atrial paced rhythm, rate 60, diffuse less than 1 mm ST depressions  Recent Labs: 12/14/2021: ALT 17; TSH 2.122 12/29/2021: Magnesium 1.9 06/18/2022: B Natriuretic Peptide 65.9; Hemoglobin 16.7; Platelets 200 06/22/2022: BUN 26; Creatinine, Ser 1.60; Potassium 4.0; Sodium 139  Recent Lipid Panel    Component Value Date/Time   CHOL 148 06/19/2022 1519   TRIG 247 (H) 06/19/2022 1519   HDL 59 06/19/2022 1519   CHOLHDL 2.5 06/19/2022 1519   VLDL 49 (H) 06/19/2022 1519   LDLCALC 40 06/19/2022 1519    Physical Exam:    VS:  There were no vitals taken for this visit.    Wt Readings from Last 3 Encounters:  06/22/22 214 lb 11.2 oz (  97.4 kg)  05/02/22 223 lb 12.8 oz (101.5 kg)  04/05/22 220 lb (99.8 kg)     GEN:  Well nourished,  well developed in no acute distress HEENT: Normal NECK: No JVD; No carotid bruits LYMPHATICS: No lymphadenopathy CARDIAC: RRR, 2/6 systolic murmur RESPIRATORY:  Clear to auscultation without rales, wheezing or rhonchi  ABDOMEN: Soft, non-tender, non-distended MUSCULOSKELETAL:  No edema; No deformity  SKIN: Warm and dry NEUROLOGIC:  Alert and oriented x 3 PSYCHIATRIC:  Normal affect   ASSESSMENT:    No diagnosis found.  PLAN:    CAD: status post CABG.  Coronary angiography 06/07/21: "normal left main with severe mid-LAD disease and patent LIMA graft to LAD but with slow flow in the distal graft with possible filling defect vs streaming; in either case, the distal vessel is very small, too small for PCI. There were patent stents in second obtuse marginal branch. Distal circumflex was chronically occluded with right to left collaterals. Sequential SVG to two OM branches was chronically occluded. RCA contained a widely patent stent in proximal portion".  Echocardiogram 12/15/2021 showed EF 55 to 60%, low normal RV function, mild to moderate left atrial enlargement, small pericardial effusion, moderate aortic stenosis.  Lexiscan Myoview on 12/16/2021 showed no ischemia or infarction, EF 58%. -Continue Eliquis, statin -Continue carvedilol 6.25 mg twice daily -Continue Imdur 30 mg daily.  Reports no recent chest pain   Aortic stenosis: Moderate on echocardiogram 06/2022.  Repeat echo in 1 year to monitor   Chronic diastolic heart failure: On Lasix 40 mg every other day   Atrial fibrillation: On metoprolol 25 mg twice daily, Eliquis 5 mg twice daily.  In atrial paced rhythm.     Symptomatic bradycardia: Status post [employer] PPM.  Follows with Dr. Royann Shivers for device management   Hypertension: On Coreg 6.25 mg twice daily, Imdur 30 mg daily, lisinopril 2.5 mg daily   CKD stage IIIa: Baseline creatinine appears 1.4-1.5.  Check BMET   OSA: Positive sleep study 04/05/2022, starting on  BiPAP   Hyperlipidemia: On rosuvastatin 40 mg daily.  LDL 62   RTC in 4 to 6 weeks     Medication Adjustments/Labs and Tests Ordered: Current medicines are reviewed at length with the patient today.  Concerns regarding medicines are outlined above.  No orders of the defined types were placed in this encounter.  No orders of the defined types were placed in this encounter.   There are no Patient Instructions on file for this visit.   Signed, Little Ishikawa, MD  07/07/2022 10:58 PM    [facility]

## 2022-07-08 ENCOUNTER — Ambulatory Visit: Payer: Medicare Other | Attending: Cardiology | Admitting: Cardiology

## 2022-07-09 ENCOUNTER — Other Ambulatory Visit: Payer: Self-pay | Admitting: Family

## 2022-07-11 DIAGNOSIS — N179 Acute kidney failure, unspecified: Secondary | ICD-10-CM | POA: Diagnosis not present

## 2022-07-11 DIAGNOSIS — E1122 Type 2 diabetes mellitus with diabetic chronic kidney disease: Secondary | ICD-10-CM | POA: Diagnosis not present

## 2022-07-11 DIAGNOSIS — I5032 Chronic diastolic (congestive) heart failure: Secondary | ICD-10-CM | POA: Diagnosis not present

## 2022-07-11 DIAGNOSIS — N183 Chronic kidney disease, stage 3 unspecified: Secondary | ICD-10-CM | POA: Diagnosis not present

## 2022-07-11 DIAGNOSIS — I130 Hypertensive heart and chronic kidney disease with heart failure and stage 1 through stage 4 chronic kidney disease, or unspecified chronic kidney disease: Secondary | ICD-10-CM | POA: Diagnosis not present

## 2022-07-11 DIAGNOSIS — J9611 Chronic respiratory failure with hypoxia: Secondary | ICD-10-CM | POA: Diagnosis not present

## 2022-07-12 ENCOUNTER — Encounter: Payer: Self-pay | Admitting: Cardiology

## 2022-07-13 DIAGNOSIS — N179 Acute kidney failure, unspecified: Secondary | ICD-10-CM | POA: Diagnosis not present

## 2022-07-13 DIAGNOSIS — J9611 Chronic respiratory failure with hypoxia: Secondary | ICD-10-CM | POA: Diagnosis not present

## 2022-07-13 DIAGNOSIS — N183 Chronic kidney disease, stage 3 unspecified: Secondary | ICD-10-CM | POA: Diagnosis not present

## 2022-07-13 DIAGNOSIS — I130 Hypertensive heart and chronic kidney disease with heart failure and stage 1 through stage 4 chronic kidney disease, or unspecified chronic kidney disease: Secondary | ICD-10-CM | POA: Diagnosis not present

## 2022-07-13 DIAGNOSIS — I5032 Chronic diastolic (congestive) heart failure: Secondary | ICD-10-CM | POA: Diagnosis not present

## 2022-07-13 DIAGNOSIS — E1122 Type 2 diabetes mellitus with diabetic chronic kidney disease: Secondary | ICD-10-CM | POA: Diagnosis not present

## 2022-07-15 DIAGNOSIS — H2513 Age-related nuclear cataract, bilateral: Secondary | ICD-10-CM | POA: Diagnosis not present

## 2022-07-15 DIAGNOSIS — H40013 Open angle with borderline findings, low risk, bilateral: Secondary | ICD-10-CM | POA: Diagnosis not present

## 2022-07-15 DIAGNOSIS — H2512 Age-related nuclear cataract, left eye: Secondary | ICD-10-CM | POA: Diagnosis not present

## 2022-07-19 DIAGNOSIS — N179 Acute kidney failure, unspecified: Secondary | ICD-10-CM | POA: Diagnosis not present

## 2022-07-19 DIAGNOSIS — E1122 Type 2 diabetes mellitus with diabetic chronic kidney disease: Secondary | ICD-10-CM | POA: Diagnosis not present

## 2022-07-19 DIAGNOSIS — U071 COVID-19: Secondary | ICD-10-CM | POA: Diagnosis not present

## 2022-07-19 DIAGNOSIS — J9611 Chronic respiratory failure with hypoxia: Secondary | ICD-10-CM | POA: Diagnosis not present

## 2022-07-19 DIAGNOSIS — I130 Hypertensive heart and chronic kidney disease with heart failure and stage 1 through stage 4 chronic kidney disease, or unspecified chronic kidney disease: Secondary | ICD-10-CM | POA: Diagnosis not present

## 2022-07-19 DIAGNOSIS — I5032 Chronic diastolic (congestive) heart failure: Secondary | ICD-10-CM | POA: Diagnosis not present

## 2022-07-19 DIAGNOSIS — N183 Chronic kidney disease, stage 3 unspecified: Secondary | ICD-10-CM | POA: Diagnosis not present

## 2022-07-20 ENCOUNTER — Telehealth: Payer: Self-pay

## 2022-07-20 NOTE — Telephone Encounter (Signed)
Darnelle, Development worker, international aid, with [facility] called and left message on clinical intake voicemail. She stated that Nurse is changing discharge visit to next week instead of this week. She just wanted to make you  aware and see if you were okay with this.  Message routed to Richarda Blade, NP

## 2022-07-20 NOTE — Telephone Encounter (Signed)
Schedule for follow up when discharge.

## 2022-07-21 DIAGNOSIS — J9611 Chronic respiratory failure with hypoxia: Secondary | ICD-10-CM | POA: Diagnosis not present

## 2022-07-21 DIAGNOSIS — N179 Acute kidney failure, unspecified: Secondary | ICD-10-CM | POA: Diagnosis not present

## 2022-07-21 DIAGNOSIS — I5032 Chronic diastolic (congestive) heart failure: Secondary | ICD-10-CM | POA: Diagnosis not present

## 2022-07-21 DIAGNOSIS — N183 Chronic kidney disease, stage 3 unspecified: Secondary | ICD-10-CM | POA: Diagnosis not present

## 2022-07-21 DIAGNOSIS — E1122 Type 2 diabetes mellitus with diabetic chronic kidney disease: Secondary | ICD-10-CM | POA: Diagnosis not present

## 2022-07-21 DIAGNOSIS — I130 Hypertensive heart and chronic kidney disease with heart failure and stage 1 through stage 4 chronic kidney disease, or unspecified chronic kidney disease: Secondary | ICD-10-CM | POA: Diagnosis not present

## 2022-07-22 DIAGNOSIS — I130 Hypertensive heart and chronic kidney disease with heart failure and stage 1 through stage 4 chronic kidney disease, or unspecified chronic kidney disease: Secondary | ICD-10-CM | POA: Diagnosis not present

## 2022-07-22 DIAGNOSIS — E1122 Type 2 diabetes mellitus with diabetic chronic kidney disease: Secondary | ICD-10-CM | POA: Diagnosis not present

## 2022-07-22 DIAGNOSIS — U071 COVID-19: Secondary | ICD-10-CM | POA: Diagnosis not present

## 2022-07-22 DIAGNOSIS — N179 Acute kidney failure, unspecified: Secondary | ICD-10-CM | POA: Diagnosis not present

## 2022-07-22 DIAGNOSIS — N183 Chronic kidney disease, stage 3 unspecified: Secondary | ICD-10-CM | POA: Diagnosis not present

## 2022-07-22 DIAGNOSIS — J9611 Chronic respiratory failure with hypoxia: Secondary | ICD-10-CM | POA: Diagnosis not present

## 2022-07-22 DIAGNOSIS — I5032 Chronic diastolic (congestive) heart failure: Secondary | ICD-10-CM | POA: Diagnosis not present

## 2022-07-27 DIAGNOSIS — I130 Hypertensive heart and chronic kidney disease with heart failure and stage 1 through stage 4 chronic kidney disease, or unspecified chronic kidney disease: Secondary | ICD-10-CM | POA: Diagnosis not present

## 2022-07-27 DIAGNOSIS — J9611 Chronic respiratory failure with hypoxia: Secondary | ICD-10-CM | POA: Diagnosis not present

## 2022-07-27 DIAGNOSIS — E1122 Type 2 diabetes mellitus with diabetic chronic kidney disease: Secondary | ICD-10-CM | POA: Diagnosis not present

## 2022-07-27 DIAGNOSIS — N183 Chronic kidney disease, stage 3 unspecified: Secondary | ICD-10-CM | POA: Diagnosis not present

## 2022-07-27 DIAGNOSIS — N179 Acute kidney failure, unspecified: Secondary | ICD-10-CM | POA: Diagnosis not present

## 2022-07-27 DIAGNOSIS — I5032 Chronic diastolic (congestive) heart failure: Secondary | ICD-10-CM | POA: Diagnosis not present

## 2022-08-02 ENCOUNTER — Telehealth: Payer: Self-pay | Admitting: *Deleted

## 2022-08-02 DIAGNOSIS — N183 Chronic kidney disease, stage 3 unspecified: Secondary | ICD-10-CM | POA: Diagnosis not present

## 2022-08-02 DIAGNOSIS — I5032 Chronic diastolic (congestive) heart failure: Secondary | ICD-10-CM | POA: Diagnosis not present

## 2022-08-02 DIAGNOSIS — N179 Acute kidney failure, unspecified: Secondary | ICD-10-CM | POA: Diagnosis not present

## 2022-08-02 DIAGNOSIS — J9611 Chronic respiratory failure with hypoxia: Secondary | ICD-10-CM | POA: Diagnosis not present

## 2022-08-02 DIAGNOSIS — E1122 Type 2 diabetes mellitus with diabetic chronic kidney disease: Secondary | ICD-10-CM | POA: Diagnosis not present

## 2022-08-02 DIAGNOSIS — I130 Hypertensive heart and chronic kidney disease with heart failure and stage 1 through stage 4 chronic kidney disease, or unspecified chronic kidney disease: Secondary | ICD-10-CM | POA: Diagnosis not present

## 2022-08-02 NOTE — Telephone Encounter (Signed)
   Pre-operative Risk Assessment    Patient Name: Johnny [name]  DOB: August 22, 1945 MRN: [id number]      Request for Surgical Clearance    Procedure:   CATARACT EXTRACTION WITH INTRAOCULAR LENS IMPLANT OF THE LEFT EYE ON 09/01/22; TO BE FOLLOWED BY THE RIGHT EYE TO BE DONE ON 09/22/22  Date of Surgery:  Clearance 09/01/22                                 Surgeon:  DR. LISA SUN Surgeon's Group or Practice Name:  [facility] Phone number:  [phone] Fax number:  [phone]   Type of Clearance Requested:   - Medical ; PER THE CLEARANCE FORM NO MEDICATIONS ARE NEEDING TO BE HELD    Type of Anesthesia:   TOPICAL WITH IV SEDATION   Additional requests/questions:    Johnny [name]   08/02/2022, 4:07 PM

## 2022-08-02 NOTE — Telephone Encounter (Signed)
   Patient Name: Johnny [name]  DOB: 1945-09-24 MRN: [id number]  Primary Cardiologist: Donato Heinz, MD  Chart reviewed as part of pre-operative protocol coverage.  Reassuring nuclear stress test 11/2021 and echocardiogram 06/2022. Pt was hospitalized with chest pain in November with mild but flat troponin elevation. While cataract extractions are recognized in guidelines as low risk surgeries that do not typically require specific preoperative testing or holding of blood thinner therapy, consider postponing until he is seen by Dr. Gardiner Rhyme on 09/05/22.    I will route this recommendation to the requesting party via Epic fax function and remove from pre-op pool.  Please call with questions.  Ledora Bottcher, PA 08/02/2022, 4:46 PM

## 2022-08-03 ENCOUNTER — Ambulatory Visit (INDEPENDENT_AMBULATORY_CARE_PROVIDER_SITE_OTHER): Payer: Medicare Other | Admitting: Family

## 2022-08-03 ENCOUNTER — Encounter: Payer: Self-pay | Admitting: Family

## 2022-08-03 ENCOUNTER — Other Ambulatory Visit: Payer: Self-pay | Admitting: Family

## 2022-08-03 VITALS — BP 120/80 | HR 60 | Temp 97.5°F | Resp 18 | Ht 66.0 in | Wt 220.0 lb

## 2022-08-03 DIAGNOSIS — J329 Chronic sinusitis, unspecified: Secondary | ICD-10-CM | POA: Diagnosis not present

## 2022-08-03 DIAGNOSIS — R040 Epistaxis: Secondary | ICD-10-CM | POA: Diagnosis not present

## 2022-08-03 DIAGNOSIS — B9689 Other specified bacterial agents as the cause of diseases classified elsewhere: Secondary | ICD-10-CM | POA: Diagnosis not present

## 2022-08-03 DIAGNOSIS — I5032 Chronic diastolic (congestive) heart failure: Secondary | ICD-10-CM

## 2022-08-03 MED ORDER — SALINE SPRAY 0.65 % NA SOLN: 1.0000 | NASAL | 3 refills | Status: DC | PRN

## 2022-08-03 MED ORDER — DOXYCYCLINE HYCLATE 100 MG PO TABS: 100.0000 mg | ORAL_TABLET | Freq: Two times a day (BID) | ORAL | 0 refills | Status: AC

## 2022-08-03 MED ORDER — LORATADINE 10 MG PO TABS: 10.0000 mg | ORAL_TABLET | Freq: Every day | ORAL | 11 refills | Status: DC

## 2022-08-03 NOTE — Patient Instructions (Signed)
-   Notify provider if symptoms worsen or fail to improve  °

## 2022-08-03 NOTE — Progress Notes (Signed)
Provider: Marlowe Sax FNP-C  Andyn Sales, Nelda Bucks, NP  Patient Care Team: Elleah Hemsley, Nelda Bucks, NP as PCP - General (Family Medicine) Donato Heinz, MD as PCP - Cardiology (Cardiology)  Extended Emergency Contact Information Primary Emergency Contact: Posceo,Deborah Address: [address] [place] [address] Johnnette Litter of [place] Phone: [phone] Mobile Phone: [phone] Relation: Sister Preferred language: English Secondary Emergency Contact: Upham,Pattie Mobile Phone: [phone] Relation: Other  Code Status:  Full code  Goals of care: Advanced Directive information    06/19/2022   10:21 PM  Advanced Directives  Would patient like information on creating a medical advance directive? No - Patient declined     Chief Complaint  Patient presents with   Acute Visit    Patient complains of possible sinus infection.     HPI:  Pt is a 77 y.o. male seen today for an acute visit for evaluation of sinus infection.Gets hot then starts sweating but no fever.Has not been in contact with person with COVID-19. Had right nasal bleeding today but was able to stop it. Has had recurrent sinus infection. She denies any fever,chills,cough,fatigue,body aches,runny nose,chest tightness,chest pain,palpitation or shortness of breath.  Past Medical History:  Diagnosis Date   Atrial fibrillation ([place])    Deafness in left ear    Hypertension    Symptomatic bradycardia 04/2021   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[place], [place]   Past Surgical History:  Procedure Laterality Date   BACK SURGERY  1975   Lower Back Surgery, Dr.Seltzer   BACK SURGERY  1985   Dr.Seltzer   SHOULDER SURGERY Left 1965   Dr.Seltzer   TONSILLECTOMY  1950   Dr.Perellie    Allergies  Allergen Reactions   Acacia Itching   Metformin And Related Other (See Comments)    Metformin caused the patient's legs to become weak    Outpatient Encounter Medications as of 08/03/2022  Medication Sig    albuterol (VENTOLIN HFA) 108 (90 Base) MCG/ACT inhaler Inhale 1 puff into the lungs every 6 (six) hours as needed for wheezing or shortness of breath.   apixaban (ELIQUIS) 5 MG TABS tablet Take 1 tablet (5 mg total) by mouth 2 (two) times daily.   carvedilol (COREG) 6.25 MG tablet Take 1 tablet (6.25 mg total) by mouth 2 (two) times daily.   famotidine (PEPCID) 40 MG tablet Take 40 mg by mouth daily.   fluticasone (FLONASE) 50 MCG/ACT nasal spray Place 2 sprays into both nostrils 3 (three) times daily as needed for allergies or rhinitis.   furosemide (LASIX) 40 MG tablet TAKE 1 TABLET BY MOUTH EVERY OTHER DAY   isosorbide mononitrate (IMDUR) 30 MG 24 hr tablet Take 1 tablet (30 mg total) by mouth daily.   lisinopril (ZESTRIL) 2.5 MG tablet Take 2.5 mg by mouth daily.   montelukast (SINGULAIR) 10 MG tablet Take 10 mg by mouth daily as needed (for allergies).   nitroGLYCERIN (NITROSTAT) 0.4 MG SL tablet Place 0.4 mg under the tongue every 5 (five) minutes as needed for chest pain.   pantoprazole (PROTONIX) 40 MG tablet TAKE 1 TABLET BY MOUTH DAILY  BEFORE BREAKFAST   rosuvastatin (CRESTOR) 40 MG tablet Take 1 tablet (40 mg total) by mouth daily.   TYLENOL 8 HOUR ARTHRITIS PAIN 650 MG CR tablet Take 650-1,300 mg by mouth every 8 (eight) hours as needed for pain.   venlafaxine XR (EFFEXOR-XR) 150 MG 24 hr capsule Take 1 capsule (150 mg total)  by mouth daily with breakfast.   [DISCONTINUED] furosemide (LASIX) 40 MG tablet Take 1 tablet (40 mg total) by mouth every other day.   [DISCONTINUED] loratadine (CLARITIN) 10 MG tablet Take 1 tablet (10 mg total) by mouth daily for 14 days.   No facility-administered encounter medications on file as of 08/03/2022.    Review of Systems  Constitutional:  Negative for appetite change, chills, fatigue, fever and unexpected weight change.  HENT:  Positive for congestion, hearing loss, postnasal drip, sinus pressure and sinus pain. Negative for dental problem, ear  discharge, ear pain, facial swelling, nosebleeds, rhinorrhea, sneezing, sore throat, tinnitus and trouble swallowing.        Wears right hearing aid   Eyes:  Negative for pain, discharge, redness, itching and visual disturbance.  Respiratory:  Negative for cough, chest tightness, shortness of breath and wheezing.   Cardiovascular:  Negative for chest pain, palpitations and leg swelling.  Gastrointestinal:  Negative for abdominal distention, abdominal pain, constipation, diarrhea, nausea and vomiting.  Neurological:  Negative for dizziness, light-headedness and headaches.    Immunization History  Administered Date(s) Administered   Fluad Quad(high Dose 65+) 05/06/2020, 06/22/2022   Influenza Split 09/15/2009, 05/03/2013   Influenza, High Dose Seasonal PF 06/05/2014, 04/20/2015, 06/08/2016, 04/22/2017, 04/15/2021   Influenza-Unspecified 07/14/2004, 05/07/2007, 05/02/2008, 05/01/2018, 04/29/2019   PPD Test 06/08/2021   Pneumococcal Conjugate-13 06/05/2014   Pneumococcal Polysaccharide-23 08/13/2012   Pertinent  Health Maintenance Due  Topic Date Due   FOOT EXAM  Never done   OPHTHALMOLOGY EXAM  Never done   HEMOGLOBIN A1C  12/18/2022   INFLUENZA VACCINE  Completed      06/20/2022   12:00 PM 06/20/2022    9:00 PM 06/21/2022    9:00 AM 06/21/2022    8:00 PM 08/03/2022    1:03 PM  Fall Risk  Falls in the past year?     0  Was there an injury with Fall?     0  Fall Risk Category Calculator     0  Fall Risk Category     Low  Patient Fall Risk Level High fall risk High fall risk High fall risk High fall risk Low fall risk  Patient at Risk for Falls Due to     No Fall Risks  Fall risk Follow up     Falls evaluation completed   Functional Status Survey:    Vitals:   08/03/22 1307  BP: 120/80  Pulse: 60  Resp: 18  Temp: (!) 97.5 F (36.4 C)  SpO2: 96%  Weight: 220 lb (99.8 kg)  Height: 5\' 6"  (1.676 m)   Body mass index is 35.51 kg/m. Physical Exam Vitals reviewed.   Constitutional:      General: He is not in acute distress.    Appearance: Normal appearance. He is obese. He is not ill-appearing or diaphoretic.  HENT:     Head: Normocephalic.     Right Ear: Tympanic membrane, ear canal and external ear normal. There is no impacted cerumen.     Left Ear: Tympanic membrane, ear canal and external ear normal. There is no impacted cerumen.     Ears:     Comments: Right hearing aid in place     Nose: No congestion or rhinorrhea.     Right Nostril: No foreign body or epistaxis.     Left Nostril: No foreign body or epistaxis.     Right Turbinates: Not enlarged, swollen or pale.     Left Turbinates: Not enlarged,  swollen or pale.     Right Sinus: Maxillary sinus tenderness and frontal sinus tenderness present.     Left Sinus: Maxillary sinus tenderness and frontal sinus tenderness present.     Mouth/Throat:     Mouth: Mucous membranes are moist.     Pharynx: Oropharynx is clear. No oropharyngeal exudate or posterior oropharyngeal erythema.  Eyes:     General: No scleral icterus.       Right eye: No discharge.        Left eye: No discharge.     Conjunctiva/sclera: Conjunctivae normal.     Pupils: Pupils are equal, round, and reactive to light.  Cardiovascular:     Rate and Rhythm: Normal rate and regular rhythm.     Pulses: Normal pulses.     Heart sounds: Normal heart sounds. No murmur heard.    No friction rub. No gallop.  Pulmonary:     Effort: Pulmonary effort is normal. No respiratory distress.     Breath sounds: Normal breath sounds. No wheezing, rhonchi or rales.  Chest:     Chest wall: No tenderness.  Musculoskeletal:     Cervical back: Normal range of motion. No rigidity or tenderness.  Lymphadenopathy:     Cervical: No cervical adenopathy.  Skin:    General: Skin is warm and dry.     Coloration: Skin is not pale.     Findings: No erythema or rash.  Neurological:     Mental Status: He is alert and oriented to person, place, and time.      Gait: Gait normal.  Psychiatric:        Mood and Affect: Mood normal.        Speech: Speech normal.        Behavior: Behavior normal.    Labs reviewed: Recent Labs    12/29/21 0944 06/18/22 1544 06/20/22 0843 06/21/22 0214 06/22/22 0337  NA 141   < > 139 138 139  K 4.9   < > 3.8 3.7 4.0  CL 104   < > 99 100 101  CO2 24   < > 28 29 27   GLUCOSE 97   < > 110* 106* 89  BUN 25   < > 27* 25* 26*  CREATININE 1.41*   < > 1.82* 1.68* 1.60*  CALCIUM 10.3*   < > 10.3 10.4* 10.5*  MG 1.9  --   --   --   --    < > = values in this interval not displayed.   Recent Labs    12/14/21 1353  AST 19  ALT 17  ALKPHOS 93  BILITOT 1.1  PROT 7.9  ALBUMIN 4.3   Recent Labs    12/16/21 0209 12/29/21 0944 06/18/22 1544  WBC 8.4 9.3 7.9  HGB 15.9 15.2 16.7  HCT 48.8 44.7 51.0  MCV 83.7 82 86.3  PLT 197 232 200   Lab Results  Component Value Date   TSH 2.122 12/14/2021   Lab Results  Component Value Date   HGBA1C 6.0 (H) 06/19/2022   Lab Results  Component Value Date   CHOL 148 06/19/2022   HDL 59 06/19/2022   LDLCALC 40 06/19/2022   TRIG 247 (H) 06/19/2022   CHOLHDL 2.5 06/19/2022    Significant Diagnostic Results in last 30 days:  No results found.  Assessment/Plan  1.  Sinusitis, bacterial Bilateral frontal and maxillary sinus tender to percussion during visit  - advised to use saline nasal rinse - Antihistamine has been  effective in the past but had stopped loratadine.advised to use loratadine for at least 2 weeks - start on doxycycline as below - doxycycline (VIBRA-TABS) 100 MG tablet; Take 1 tablet (100 mg total) by mouth 2 (two) times daily for 7 days.  Dispense: 14 tablet; Refill: 0 - loratadine (CLARITIN) 10 MG tablet; Take 1 tablet (10 mg total) by mouth daily.  Dispense: 30 tablet; Refill: 11 - sodium chloride (OCEAN) 0.65 % SOLN nasal spray; Place 1 spray into both nostrils as needed for congestion.  Dispense: 88 mL; Refill: 3  2. Acute anterior  epistaxis Had right nare bleeding earlier prior to visit but stopped. - advised to apply pressure if bleeding start and notify provider or go to ED.  Family/ staff Communication: Reviewed plan of care with patient verbalized understanding   Labs/tests ordered: None   Next Appointment: Return if symptoms worsen or fail to improve.   Caesar Bookman, NP

## 2022-08-03 NOTE — Telephone Encounter (Signed)
Furosemide refilled. 

## 2022-08-07 DIAGNOSIS — R053 Chronic cough: Secondary | ICD-10-CM | POA: Diagnosis not present

## 2022-08-08 DIAGNOSIS — I130 Hypertensive heart and chronic kidney disease with heart failure and stage 1 through stage 4 chronic kidney disease, or unspecified chronic kidney disease: Secondary | ICD-10-CM | POA: Diagnosis not present

## 2022-08-08 DIAGNOSIS — J9611 Chronic respiratory failure with hypoxia: Secondary | ICD-10-CM | POA: Diagnosis not present

## 2022-08-08 DIAGNOSIS — N183 Chronic kidney disease, stage 3 unspecified: Secondary | ICD-10-CM | POA: Diagnosis not present

## 2022-08-08 DIAGNOSIS — E1122 Type 2 diabetes mellitus with diabetic chronic kidney disease: Secondary | ICD-10-CM | POA: Diagnosis not present

## 2022-08-08 DIAGNOSIS — N179 Acute kidney failure, unspecified: Secondary | ICD-10-CM | POA: Diagnosis not present

## 2022-08-08 DIAGNOSIS — I5032 Chronic diastolic (congestive) heart failure: Secondary | ICD-10-CM | POA: Diagnosis not present

## 2022-08-19 DIAGNOSIS — U071 COVID-19: Secondary | ICD-10-CM | POA: Diagnosis not present

## 2022-08-22 ENCOUNTER — Telehealth (INDEPENDENT_AMBULATORY_CARE_PROVIDER_SITE_OTHER): Payer: Medicare Other | Admitting: Family

## 2022-08-22 ENCOUNTER — Encounter: Payer: Self-pay | Admitting: Family

## 2022-08-22 DIAGNOSIS — R21 Rash and other nonspecific skin eruption: Secondary | ICD-10-CM

## 2022-08-22 DIAGNOSIS — R42 Dizziness and giddiness: Secondary | ICD-10-CM

## 2022-08-22 DIAGNOSIS — U071 COVID-19: Secondary | ICD-10-CM | POA: Diagnosis not present

## 2022-08-22 DIAGNOSIS — R413 Other amnesia: Secondary | ICD-10-CM | POA: Diagnosis not present

## 2022-08-22 NOTE — Progress Notes (Signed)
This service is provided via telemedicine  No vital signs collected/recorded due to the encounter was a telemedicine visit.   Location of patient (ex: home, work):  Home  Patient consents to a telephone visit:  Yes  Location of the provider (ex: office, home):  [employer].  Name of any referring provider:  Hazley Dezeeuw, Nelda Bucks, NP   Names of all persons participating in the telemedicine service and their role in the encounter:  Patient, Johnny [name], [place], [place], Webb Silversmith, NP.    Time spent on call:  8 minutes spent on the phone with Medical Assistant.     Provider: Marlowe Sax FNP-C  [place], Nelda Bucks, NP  Patient Care Team: Weda Baumgarner, Nelda Bucks, NP as PCP - General (Family Medicine) Donato Heinz, MD as PCP - Cardiology (Cardiology)  Extended Emergency Contact Information Primary Emergency Contact: Posceo,Deborah Address: [address] [place] [address] Johnnette Litter of [place] Phone: [phone] Mobile Phone: [phone] Relation: Sister Preferred language: English Secondary Emergency Contact: Upham,Pattie Mobile Phone: [phone] Relation: Other  Code Status:  Full Code  Goals of care: Advanced Directive information    08/22/2022    1:42 PM  Advanced Directives  Does Patient Have a Medical Advance Directive? No  Would patient like information on creating a medical advance directive? No - Patient declined     Chief Complaint  Patient presents with   Acute Visit    Patient is requesting referral to Neurology and Dermatology.     HPI:  Pt is a 77 y.o. male seen today for an acute visit for evaluation of skin lesion on the neck,ears.describe lesion as itchy on the back of the neck but not on the ears.denies any redness or drainage.  Also complains dizziness and memory loss. States short term memory has worsen. Almost fell recent but went down on his knees and supported himself with his hands. States room does  not spin.  He request referral to Neurologist for memory loss and dermatologist for skin lesion.    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[place], [place]   Past Surgical History:  Procedure Laterality Date   BACK SURGERY  1975   Lower Back Surgery, Dr.Seltzer   BACK SURGERY  1985   Dr.Seltzer   SHOULDER SURGERY Left 1965   Dr.Seltzer   TONSILLECTOMY  1950   Dr.Perellie    Allergies  Allergen Reactions   Acacia Itching   Metformin And Related Other (See Comments)    Metformin caused the patient's legs to become weak    Outpatient Encounter Medications as of 08/22/2022  Medication Sig   albuterol (VENTOLIN HFA) 108 (90 Base) MCG/ACT inhaler Inhale 1 puff into the lungs every 6 (six) hours as needed for wheezing or shortness of breath.   apixaban (ELIQUIS) 5 MG TABS tablet Take 1 tablet (5 mg total) by mouth 2 (two) times daily.   carvedilol (COREG) 6.25 MG tablet Take 1 tablet (6.25 mg total) by mouth 2 (two) times daily.   famotidine (PEPCID) 40 MG tablet Take 40 mg by mouth daily.   fluticasone (FLONASE) 50 MCG/ACT nasal spray Place 2 sprays into both nostrils 3 (three) times daily as needed for allergies or rhinitis.   furosemide (LASIX) 40 MG tablet TAKE 1 TABLET BY MOUTH  EVERY OTHER DAY   isosorbide mononitrate (IMDUR) 30 MG 24 hr tablet Take 1 tablet (30 mg total) by mouth daily.   lisinopril (ZESTRIL) 2.5 MG tablet Take 2.5 mg by mouth daily.   loratadine (CLARITIN) 10 MG tablet Take 1 tablet (10 mg total) by mouth daily.   montelukast (SINGULAIR) 10 MG tablet Take 10 mg by mouth daily as needed (for allergies).   nitroGLYCERIN (NITROSTAT) 0.4 MG SL tablet Place 0.4 mg under the tongue every 5 (five) minutes as needed for chest pain.   pantoprazole (PROTONIX) 40 MG tablet TAKE 1 TABLET BY MOUTH DAILY  BEFORE BREAKFAST   rosuvastatin (CRESTOR) 40 MG  tablet Take 1 tablet (40 mg total) by mouth daily.   sodium chloride (OCEAN) 0.65 % SOLN nasal spray Place 1 spray into both nostrils as needed for congestion.   TYLENOL 8 HOUR ARTHRITIS PAIN 650 MG CR tablet Take 650-1,300 mg by mouth every 8 (eight) hours as needed for pain.   venlafaxine XR (EFFEXOR-XR) 150 MG 24 hr capsule Take 1 capsule (150 mg total) by mouth daily with breakfast.   No facility-administered encounter medications on file as of 08/22/2022.    Review of Systems  Constitutional:  Negative for chills, fatigue and fever.  Skin:  Positive for rash. Negative for color change, pallor and wound.       Skin lesion on the back of the neck and ears   Neurological:  Positive for dizziness. Negative for weakness, light-headedness, numbness and headaches.  Psychiatric/Behavioral:  Negative for agitation, confusion and sleep disturbance. The patient is not nervous/anxious.        Worsening short term memory loss     Immunization History  Administered Date(s) Administered   Fluad Quad(high Dose 65+) 05/06/2020, 06/22/2022   Influenza Split 09/15/2009, 05/03/2013   Influenza, High Dose Seasonal PF 06/05/2014, 04/20/2015, 06/08/2016, 04/22/2017, 04/15/2021   Influenza-Unspecified 07/14/2004, 05/07/2007, 05/02/2008, 05/01/2018, 04/29/2019   PPD Test 06/08/2021   Pneumococcal Conjugate-13 06/05/2014   Pneumococcal Polysaccharide-23 08/13/2012   Pertinent  Health Maintenance Due  Topic Date Due   FOOT EXAM  Never done   OPHTHALMOLOGY EXAM  Never done   HEMOGLOBIN A1C  12/18/2022   INFLUENZA VACCINE  Completed      06/20/2022    9:00 PM 06/21/2022    9:00 AM 06/21/2022    8:00 PM 08/03/2022    1:03 PM 08/22/2022    1:42 PM  Fall Risk  Falls in the past year?    0 0  Was there an injury with Fall?    0 0  Fall Risk Category Calculator    0 0  Fall Risk Category (Retired)    Low   (RETIRED) Patient Fall Risk Level High fall risk High fall risk High fall risk Low fall risk    Patient at Risk for Falls Due to    No Fall Risks No Fall Risks  Fall risk Follow up    Falls evaluation completed Falls evaluation completed   Functional Status Survey:    There were no vitals filed for this visit. There is no height or weight on file to calculate BMI. Physical Exam Constitutional:      General: He is not in acute distress.    Appearance: He is not ill-appearing.  Pulmonary:     Effort: No respiratory distress.  Neurological:     Mental Status: He is alert. Mental status is at baseline.  Psychiatric:        Mood and  Affect: Mood normal.        Behavior: Behavior normal.        Cognition and Memory: He exhibits impaired recent memory.     Labs reviewed: Recent Labs    12/29/21 0944 06/18/22 1544 06/20/22 0843 06/21/22 0214 06/22/22 0337  NA 141   < > 139 138 139  K 4.9   < > 3.8 3.7 4.0  CL 104   < > 99 100 101  CO2 24   < > 28 29 27   GLUCOSE 97   < > 110* 106* 89  BUN 25   < > 27* 25* 26*  CREATININE 1.41*   < > 1.82* 1.68* 1.60*  CALCIUM 10.3*   < > 10.3 10.4* 10.5*  MG 1.9  --   --   --   --    < > = values in this interval not displayed.   Recent Labs    12/14/21 1353  AST 19  ALT 17  ALKPHOS 93  BILITOT 1.1  PROT 7.9  ALBUMIN 4.3   Recent Labs    12/16/21 0209 12/29/21 0944 06/18/22 1544  WBC 8.4 9.3 7.9  HGB 15.9 15.2 16.7  HCT 48.8 44.7 51.0  MCV 83.7 82 86.3  PLT 197 232 200   Lab Results  Component Value Date   TSH 2.122 12/14/2021   Lab Results  Component Value Date   HGBA1C 6.0 (H) 06/19/2022   Lab Results  Component Value Date   CHOL 148 06/19/2022   HDL 59 06/19/2022   LDLCALC 40 06/19/2022   TRIG 247 (H) 06/19/2022   CHOLHDL 2.5 06/19/2022    Significant Diagnostic Results in last 30 days:  No results found.  Assessment/Plan 1. Memory loss Reports worsening memory loss request referral to Neurologist. - encouraged to do brain stimulating activities such as reading ,puzzles,crosswords etc - obtain  MMSE when in office to obtain baseline - Ambulatory referral to Neurology  2. Rash and nonspecific skin eruption Reports rash on the back of the neck and both ears.Has used steroid cream in the past without any improvement  - Ambulatory referral to Dermatology  3. Dizziness No worsening with change of position or standing.No spinning reported.No home B/p readings for review.recommend in office for evaluation to check Orthostatics B/p but request referral to Neurologist.    Family/ staff Communication: Reviewed plan of care with patient verbalized understanding   Labs/tests ordered: None   Next Appointment: Return if symptoms worsen or fail to improve.    I connected with  06/21/2022 on 08/22/22 by a video enabled telemedicine application and verified that I am speaking with the correct person using two identifiers.   I discussed the limitations of evaluation and management by telemedicine. The patient expressed understanding and agreed to proceed.   Spent 11 minutes of face to face with patient  >50% time spent counseling; reviewing medical record and developing future plan of care.  08/24/22, NP

## 2022-08-23 ENCOUNTER — Ambulatory Visit: Payer: Medicare Other | Attending: Cardiovascular Disease

## 2022-08-25 ENCOUNTER — Other Ambulatory Visit: Payer: Self-pay | Admitting: Family

## 2022-08-25 DIAGNOSIS — I5032 Chronic diastolic (congestive) heart failure: Secondary | ICD-10-CM

## 2022-09-02 ENCOUNTER — Telehealth: Payer: Self-pay

## 2022-09-02 DIAGNOSIS — I5032 Chronic diastolic (congestive) heart failure: Secondary | ICD-10-CM

## 2022-09-02 DIAGNOSIS — B9689 Other specified bacterial agents as the cause of diseases classified elsewhere: Secondary | ICD-10-CM

## 2022-09-02 MED ORDER — VENLAFAXINE HCL ER 150 MG PO CP24: 150.0000 mg | ORAL_CAPSULE | Freq: Every day | ORAL | 1 refills | Status: DC

## 2022-09-02 MED ORDER — PANTOPRAZOLE SODIUM 40 MG PO TBEC: 40.0000 mg | DELAYED_RELEASE_TABLET | Freq: Every day | ORAL | 2 refills | Status: DC

## 2022-09-02 MED ORDER — SALINE SPRAY 0.65 % NA SOLN: 1.0000 | NASAL | 3 refills | Status: DC | PRN

## 2022-09-02 MED ORDER — CARVEDILOL 6.25 MG PO TABS: 6.2500 mg | ORAL_TABLET | Freq: Two times a day (BID) | ORAL | 1 refills | Status: DC

## 2022-09-02 MED ORDER — LORATADINE 10 MG PO TABS: 10.0000 mg | ORAL_TABLET | Freq: Every day | ORAL | 11 refills | Status: AC

## 2022-09-02 MED ORDER — ISOSORBIDE MONONITRATE ER 30 MG PO TB24: 30.0000 mg | ORAL_TABLET | Freq: Every day | ORAL | 1 refills | Status: DC

## 2022-09-02 MED ORDER — FUROSEMIDE 40 MG PO TABS: 40.0000 mg | ORAL_TABLET | ORAL | 1 refills | Status: DC

## 2022-09-02 MED ORDER — ROSUVASTATIN CALCIUM 40 MG PO TABS: 40.0000 mg | ORAL_TABLET | Freq: Every day | ORAL | 1 refills | Status: DC

## 2022-09-02 NOTE — Telephone Encounter (Signed)
Patient called for a medication refill on all medications but I have refilled all the medications that were prescribed by Dinah only. Per Chrae that was okay

## 2022-09-04 NOTE — Progress Notes (Deleted)
Cardiology Office Note:    Date:  01/10/2022   ID:  Johnny [name], DOB Nov 07, 1945, MRN [id number]  PCP:  Sandrea Hughs, NP  Cardiologist:  None  Electrophysiologist:  None   Referring MD: Sandrea Hughs, NP   Chief Complaint  Patient presents with   Coronary Artery Disease    History of Present Illness:    Johnny [name] is a 77 y.o. male with a hx of CAD status post CABG in 2015, moderate aortic stenosis, diastolic heart failure, OSA, atrial fibrillation, symptomatic bradycardia status post PPM who presents for follow-up.  He was admitted 11/2021 with chest pain.  He had rmoved from [place] in 09/2021.  Echocardiogram 12/15/2021 showed EF 55 to 60%, low normal RV function, mild to moderate left atrial enlargement, small pericardial effusion, moderate aortic stenosis.  Lexiscan Myoview on 12/16/2021 showed no ischemia or infarction, EF 58%.  He was admitted 06/2022 with chest pain.  Troponins 34 > 35.  Echocardiogram 06/19/2022 showed EF 65 to 70%, moderate aortic stenosis.  He was found of AKI with creatinine 1.7, lisinopril and Lasix were initially held.  Lisinopril was resumed at discharge and Lasix was changed to every other day.  Amiodarone was stopped as he has been maintaining sinus rhythm.  Since discharge from hospital, ***BiPAP Since discharge from the hospital, he reports that he is doing better.  He states he has not had any recent chest pain.  Has been having shortness of breath, improves with inhaler use.  Denies any lightheadedness, syncope, or palpitations.  Reports weight has increased, up 10 pounds on home scale.  Had episode of epistaxis last night, otherwise denies any bleeding issues.   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   apixaban (ELIQUIS) 5 MG TABS tablet Take 1 tablet (5 mg total) by mouth 2 (two) times daily.   aspirin EC 81 MG tablet Take 81 mg by mouth daily. Swallow whole.   carvedilol (COREG) 6.25 MG tablet Take 1 tablet (6.25 mg total) by mouth 2 (two) times daily.   fluticasone (FLONASE) 50 MCG/ACT nasal spray Place 2 sprays into both nostrils 3 (three) times daily as needed for allergies or rhinitis.   isosorbide mononitrate (IMDUR) 30 MG 24 hr tablet Take 1 tablet (30 mg total) by mouth daily.   montelukast (SINGULAIR) 10 MG tablet Take 10 mg by mouth daily as needed (for allergies).   nitroGLYCERIN (NITROSTAT) 0.4 MG SL tablet Place 0.4 mg under the tongue every 5 (five) minutes as needed for chest pain.   pantoprazole (PROTONIX) 40 MG tablet Take 40 mg by mouth daily before breakfast.   rosuvastatin (CRESTOR) 40 MG tablet Take 40 mg by mouth daily.   triamcinolone cream (  KENALOG) 0.1 % Apply 1 application. topically 2 (two) times daily as needed (to affected areas of the throat and neck).   TYLENOL 8 HOUR ARTHRITIS PAIN 650 MG CR tablet Take 650-1,300 mg by mouth every 8 (eight) hours as needed for pain.   venlafaxine XR (EFFEXOR-XR) 150 MG 24 hr capsule Take 150 mg by mouth daily with breakfast.   [DISCONTINUED] furosemide (LASIX) 40 MG tablet Take 40 mg by mouth daily as needed for edema or fluid.   [DISCONTINUED] metoprolol tartrate (LOPRESSOR) 25 MG tablet Take 1 tablet (25 mg total) by mouth 2 (two) times daily.     Allergies:   Metformin and related   Social History   Socioeconomic History    Marital status: Widowed    Spouse name: Not on file   Number of children: Not on file   Years of education: Not on file   Highest education level: Not on file  Occupational History   Not on file  Tobacco Use   Smoking status: Former    Packs/day: 1.00    Years: 10.00    Total pack years: 10.00    Types: Cigarettes   Smokeless tobacco: Never  Substance and Sexual Activity   Alcohol use: Never   Drug use: Never   Sexual activity: Not on file  Other Topics Concern   Not on file  Social History Narrative   Tobacco use, amount per day now: None   Past tobacco use, amount per day: 1 pack   How many years did you use tobacco: 10 years.   Alcohol use (drinks per week): None   Diet:   Do you drink/eat things with caffeine: Yes   Marital status:  Widow                                What year were you married? 1972   Do you live in a house, apartment, assisted living, condo, trailer, etc.? House   Is it one or more stories? One   How many persons live in your home? One   Do you have pets in your home?( please list) No   Highest Level of education completed? 12th grade.   Current or past profession: Building control surveyor, Biomedical scientist, Therapist, art.    Do you exercise?   No                               Type and how often?   Do you have a living will? Yes   Do you have a DNR form?     No                              If not, do you want to discuss one?   Do you have signed POA/HPOA forms?  Yes                      If so, please bring to you appointment      Do you have any difficulty bathing or dressing yourself? No   Do you have any difficulty preparing food or eating? No   Do you have any difficulty managing your medications? No   Do you have any difficulty managing your finances? Yes   Do you have any difficulty affording your medications? No   Social Determinants  of Health   Financial Resource Strain: Not on file  Food Insecurity: Not on file  Transportation Needs: Not on file  Physical  Activity: Not on file  Stress: Not on file  Social Connections: Not on file     Family History: The patient's family history includes Cancer in his father; Stroke in his brother and sister.  ROS:   Please see the history of present illness.     All other systems reviewed and are negative.  EKGs/Labs/Other Studies Reviewed:    The following studies were reviewed today:   EKG:   12/29/2021: Atrial paced rhythm, rate 60, diffuse less than 1 mm ST depressions  Recent Labs: 12/14/2021: ALT 17; TSH 2.122 12/29/2021: BNP 130.2; BUN 25; Creatinine, Ser 1.41; Hemoglobin 15.2; Magnesium 1.9; Platelets 232; Potassium 4.9; Sodium 141  Recent Lipid Panel    Component Value Date/Time   CHOL 150 12/15/2021 0409   TRIG 170 (H) 12/15/2021 0409   HDL 54 12/15/2021 0409   CHOLHDL 2.8 12/15/2021 0409   VLDL 34 12/15/2021 0409   LDLCALC 62 12/15/2021 0409    Physical Exam:    VS:  BP 138/90   Pulse 60   Ht '5\' 6"'$  (1.676 m)   Wt 217 lb 3.2 oz (98.5 kg)   SpO2 97%   BMI 35.06 kg/m     Wt Readings from Last 3 Encounters:  12/29/21 217 lb 3.2 oz (98.5 kg)  12/15/21 205 lb 9.6 oz (93.3 kg)  12/14/21 211 lb 3.2 oz (95.8 kg)     GEN:  Well nourished, well developed in no acute distress HEENT: Normal NECK: No JVD; No carotid bruits LYMPHATICS: No lymphadenopathy CARDIAC: RRR, 2/6 systolic murmur RESPIRATORY:  Clear to auscultation without rales, wheezing or rhonchi  ABDOMEN: Soft, non-tender, non-distended MUSCULOSKELETAL:  No edema; No deformity  SKIN: Warm and dry NEUROLOGIC:  Alert and oriented x 3 PSYCHIATRIC:  Normal affect   ASSESSMENT:    1. CAD in native artery   2. S/P CABG (coronary artery bypass graft)   3. Aortic valve stenosis, etiology of cardiac valve disease unspecified   4. Chronic diastolic HF (heart failure) ([place])   5. OSA (obstructive sleep apnea)   6. Excessive daytime sleepiness   7. Pacemaker    PLAN:    CAD: status post CABG.  Coronary angiography  06/07/21: "normal left main with severe mid-LAD disease and patent LIMA graft to LAD but with slow flow in the distal graft with possible filling defect vs streaming; in either case, the distal vessel is very small, too small for PCI. There were patent stents in second obtuse marginal branch. Distal circumflex was chronically occluded with right to left collaterals. Sequential SVG to two OM branches was chronically occluded. RCA contained a widely patent stent in proximal portion".  Echocardiogram 12/15/2021 showed EF 55 to 60%, low normal RV function, mild to moderate left atrial enlargement, small pericardial effusion, moderate aortic stenosis.  Lexiscan Myoview on 12/16/2021 showed no ischemia or infarction, EF 58%. -Continue Eliquis, statin -Continue carb Lasix 25 mg twice daily -Continue Imdur 30 mg daily.   Aortic stenosis: Moderate on echocardiogram 06/2022.  Repeat echo in 1 year to monitor   Chronic diastolic heart failure: On Lasix 40 mg every other day   Atrial fibrillation: On metoprolol 25 mg twice daily, Eliquis 5 mg twice daily.  In atrial paced rhythm.   Symptomatic bradycardia: Status post [employer] PPM.  Follows with Dr. Sallyanne Kuster for device management   Hypertension:  On carvedilol 6.25 mg twice daily, Imdur 30 mg daily, and lisinopril 2.5 mg daily.  CKD stage IIIa: Baseline creatinine appears 1.4-1.5.  Check BMET   OSA: Positive sleep study 04/05/2022, starting BiPAP   Hyperlipidemia: On rosuvastatin 40 mg daily.  LDL 62   RTC in 4 to 6 weeks     Medication Adjustments/Labs and Tests Ordered: Current medicines are reviewed at length with the patient today.  Concerns regarding medicines are outlined above.  Orders Placed This Encounter  Procedures   Basic metabolic panel   Brain natriuretic peptide   Magnesium   CBC   Ambulatory referral to Cardiac Electrophysiology   EKG 12-Lead   ECHOCARDIOGRAM COMPLETE   Split night study   Meds ordered this encounter   Medications   carvedilol (COREG) 6.25 MG tablet    Sig: Take 1 tablet (6.25 mg total) by mouth 2 (two) times daily.    Dispense:  180 tablet    Refill:  3    STOP metoprolol   furosemide (LASIX) 40 MG tablet    Sig: Take 1 tablet (40 mg total) by mouth every other day.    Dispense:  45 tablet    Refill:  3    Patient Instructions  Medication Instructions:  STOP metoprolol  START carvedilol (Coreg) 6.25 mg two times daily Take furosemide (Lasix) 40 mg every other day  *If you need a refill on your cardiac medications before your next appointment, please call your pharmacy*   Lab Work: BMET, Mag, BNP, CBC today  If you have labs (blood work) drawn today and your tests are completely normal, you will receive your results only by: [place] (if you have MyChart) OR A paper copy in the mail If you have any lab test that is abnormal or we need to change your treatment, we will call you to review the results.   Testing/Procedures: Your physician has recommended that you have a sleep study. This test records several body functions during sleep, including: brain activity, eye movement, oxygen and carbon dioxide blood levels, heart rate and rhythm, breathing rate and rhythm, the flow of air through your mouth and nose, snoring, body muscle movements, and chest and belly movement.  Your physician has requested that you have an echocardiogram in 12 MONTHS. Echocardiography is a painless test that uses sound waves to create images of your heart. It provides your doctor with information about the size and shape of your heart and how well your heart's chambers and valves are working. This procedure takes approximately one hour. There are no restrictions for this procedure.  Follow-Up: At [facility], you and your health needs are our priority.  As part of our continuing mission to provide you with exceptional heart care, we have created designated Provider Care Teams.  These Care  Teams include your primary Cardiologist (physician) and Advanced Practice Providers (APPs -  Physician Assistants and Nurse Practitioners) who all work together to provide you with the care you need, when you need it.  We recommend signing up for the patient portal called "MyChart".  Sign up information is provided on this After Visit Summary.  MyChart is used to connect with patients for Virtual Visits (Telemedicine).  Patients are able to view lab/test results, encounter notes, upcoming appointments, etc.  Non-urgent messages can be sent to your provider as well.   To learn more about what you can do with MyChart, go to [email or web address].    Your next appointment:   4-6 weeks  with NP or PA 3 months with Dr. Gardiner Rhyme    Other Instructions You have been referred to: Dr. Sallyanne Kuster to manage your pacemaker   Important Information About Sugar         Signed, Donato Heinz, MD  01/10/2022 10:02 AM    [place]

## 2022-09-05 ENCOUNTER — Ambulatory Visit: Payer: Medicare Other | Admitting: Cardiology

## 2022-09-06 NOTE — Progress Notes (Deleted)
Cardiology Clinic Note   Patient Name: Johnny [name] Date of Encounter: 09/06/2022  Primary Care Provider:  Sandrea Hughs, NP Primary Cardiologist:  Donato Heinz, MD  Patient Profile    Johnny [name] 77 year old male presents to the clinic today for follow-up evaluation of his chest discomfort and preoperative cardiac evaluation.  Past Medical History    Past Medical History:  Diagnosis Date   Atrial fibrillation ([place])    Deafness in left ear    Hypertension    Symptomatic bradycardia 04/2021   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[place], [place]   Past Surgical History:  Procedure Laterality Date   BACK SURGERY  1975   Lower Back Surgery, Dr.Seltzer   BACK SURGERY  1985   Dr.Seltzer   SHOULDER SURGERY Left 1965   Dr.Seltzer   TONSILLECTOMY  1950   Dr.Perellie    Allergies  Allergies  Allergen Reactions   Acacia Itching   Metformin And Related Other (See Comments)    Metformin caused the patient's legs to become weak    History of Present Illness    Johnny [name] has a PMH of sick sinus syndrome status post PPM, paroxysmal atrial fibrillation, coronary artery disease status post CABG, cardiac catheterization, moderate AAS, HFpEF, severe OSA, CKD stage III, diabetes mellitus type 2, hyperlipidemia, 15-year smoking history, and hypertension.  He is originally from [place] and relocated to [facility] after his wife passed away.  He then relocated to [place].  He was seen in follow-up by Dr. Sallyanne Kuster 7/23.  At that time he denied chest pain or shortness of breath.  He reported a previous issue with superficial left infraclavicular infection that required antibiotics.  This left a large scar.  During his visit he was noted to have AP AP 43% and VP 0% without record of A-fib since device implant.  His underlying rhythm was bradycardia with one-to-one conduction.  He was continued on amiodarone for A-fib control and prescribed apixaban for stroke prevention.  He did  note a previous discontinuation of apixaban due to a significant bleeding issue and intermittent epistaxis.  His apixaban was continued.  He presented to the emergency department on 1119 and was discharged on 06/22/2022.  He reported gradual onset of dyspnea on exertion over the past several months with worsening mild chest discomfort.  He felt that he could not catch his breath over the past 2 months.  He was started on home oxygen.  His creatinine was noted to be 1.87.  His troponins were 34 and 35.  His BNP was noted to be 65.  His chest discomfort was improved with sublingual nitroglycerin.  He had not been taking his prescribed Imdur.  He reported that there had been a different position that I told him to stop it.  His EKG was reassuring and showed an EF of 65 to 70%, trivial MR, no regional wall motion abnormalities.  His chest pain was felt to be chronic in nature and have been ongoing for years.  His lisinopril and furosemide were held due to his elevated creatinine level.  His lisinopril was later restarted.  His furosemide was transitioned to every other day.  His amiodarone was stopped due to shortness of breath and him maintaining sinus rhythm.  On 06/22/2022 he continued to have persistent chest pain.  His aspirin was discontinued and he was restarted on Eliquis.  He was discharged on furosemide every other day.  He presents to the clinic today for follow-up evaluation and  preoperative cardiac evaluation.  He states***  *** denies chest pain, shortness of breath, lower extremity edema, fatigue, palpitations, melena, hematuria, hemoptysis, diaphoresis, weakness, presyncope, syncope, orthopnea, and PND.  Chest discomfort, CAD, status post CABG 2015-no chest pain today.  Was admitted to the hospital 11/23 with chronic chest pain and increasing shortness of breath.  Echocardiogram was reassuring at that time.  Details above.  He had stopped his Imdur.  His lisinopril was restarted and his  furosemide was changed to every other day.  His Imdur was also restarted.  Underwent nuclear stress test 5/23 which showed no evidence of ischemia. Heart healthy low-sodium diet Increase physical activity as tolerated Continue current medical therapy  Paroxysmal atrial fibrillation-EKG today shows***.  Compliant with apixaban and denies bleeding issues. Continue apixaban Avoid triggers caffeine, chocolate, EtOH, dehydration etc.  Aortic stenosis-no increased DOE or activity intolerance.  Chronic shortness of breath.  Echocardiogram 06/19/2022 showed moderate aortic valve stenosis. Plan for repeat echocardiogram 11/24  Sick sinus syndrome status post PPM 9/22. Follows with Dr. Sallyanne Kuster  Hyperlipidemia-LDL*** Continue rosuvastatin Heart healthy low-sodium high-fiber diet.   Increase physical activity as tolerated  Preoperative cardiac evaluation-cataract extraction, Dr. Rolanda Jay, [facility] eye surgical and [place]     Primary Cardiologist: Donato Heinz, MD  Chart reviewed as part of pre-operative protocol coverage. Given past medical history and time since last visit, based on ACC/AHA guidelines, Severin Bou would be at acceptable risk for the planned procedure without further cardiovascular testing.   Patient was advised that if he develops new symptoms prior to surgery to contact our office to arrange a follow-up appointment.  He verbalized understanding.  I will route this recommendation to the requesting party via Epic fax function and remove from pre-op pool.     Disposition: Follow-up with Dr. Gardiner Rhyme 6 months.    Home Medications    Prior to Admission medications   Medication Sig Start Date End Date Taking? Authorizing Provider  albuterol (VENTOLIN HFA) 108 (90 Base) MCG/ACT inhaler Inhale 1 puff into the lungs every 6 (six) hours as needed for wheezing or shortness of breath.    [provider]  apixaban (ELIQUIS) 5 MG TABS tablet Take 1  tablet (5 mg total) by mouth 2 (two) times daily. 06/22/22   Almyra Deforest, PA  carvedilol (COREG) 6.25 MG tablet Take 1 tablet (6.25 mg total) by mouth 2 (two) times daily. 09/02/22   Ngetich, Dinah C, NP  famotidine (PEPCID) 40 MG tablet Take 40 mg by mouth daily.    [provider]  fluticasone (FLONASE) 50 MCG/ACT nasal spray Place 2 sprays into both nostrils 3 (three) times daily as needed for allergies or rhinitis.    [provider]  furosemide (LASIX) 40 MG tablet Take 1 tablet (40 mg total) by mouth every other day. 09/02/22   Ngetich, Dinah C, NP  isosorbide mononitrate (IMDUR) 30 MG 24 hr tablet Take 1 tablet (30 mg total) by mouth daily. 09/02/22   Ngetich, Dinah C, NP  lisinopril (ZESTRIL) 2.5 MG tablet Take 2.5 mg by mouth daily.    [provider]  loratadine (CLARITIN) 10 MG tablet Take 1 tablet (10 mg total) by mouth daily. 09/02/22   Ngetich, Dinah C, NP  montelukast (SINGULAIR) 10 MG tablet Take 10 mg by mouth daily as needed (for allergies).    [provider]  nitroGLYCERIN (NITROSTAT) 0.4 MG SL tablet Place 0.4 mg under the tongue every 5 (five) minutes as needed for chest pain.  [provider]  pantoprazole (PROTONIX) 40 MG tablet Take 1 tablet (40 mg total) by mouth daily before breakfast. 09/02/22   Ngetich, Dinah C, NP  rosuvastatin (CRESTOR) 40 MG tablet Take 1 tablet (40 mg total) by mouth daily. 09/02/22   Ngetich, Dinah C, NP  sodium chloride (OCEAN) 0.65 % SOLN nasal spray Place 1 spray into both nostrils as needed for congestion. 09/02/22   Ngetich, Dinah C, NP  TYLENOL 8 HOUR ARTHRITIS PAIN 650 MG CR tablet Take 650-1,300 mg by mouth every 8 (eight) hours as needed for pain.    [provider]  venlafaxine XR (EFFEXOR-XR) 150 MG 24 hr capsule Take 1 capsule (150 mg total) by mouth daily with breakfast. 09/02/22   Ngetich, Nelda Bucks, NP    Family History    Family History  Problem Relation Age of Onset   Cancer Father    Stroke  Sister    Stroke Brother    He indicated that his mother is deceased. He indicated that his father is deceased. He indicated that two of his three sisters are alive. He indicated that the status of his brother is unknown. He indicated that the status of his daughter is unknown. He indicated that only one of his two sons is alive.  Social History    Social History   Socioeconomic History   Marital status: Widowed    Spouse name: Not on file   Number of children: Not on file   Years of education: Not on file   Highest education level: Not on file  Occupational History   Not on file  Tobacco Use   Smoking status: Former    Packs/day: 1.00    Years: 10.00    Total pack years: 10.00    Types: Cigarettes   Smokeless tobacco: Never  Substance and Sexual Activity   Alcohol use: Never   Drug use: Never   Sexual activity: Not on file  Other Topics Concern   Not on file  Social History Narrative   Tobacco use, amount per day now: None   Past tobacco use, amount per day: 1 pack   How many years did you use tobacco: 10 years.   Alcohol use (drinks per week): None   Diet:   Do you drink/eat things with caffeine: Yes   Marital status:  Widow                                What year were you married? 1972   Do you live in a house, apartment, assisted living, condo, trailer, etc.? House   Is it one or more stories? One   How many persons live in your home? One   Do you have pets in your home?( please list) No   Highest Level of education completed? 12th grade.   Current or past profession: Building control surveyor, Biomedical scientist, Therapist, art.    Do you exercise?   No                               Type and how often?   Do you have a living will? Yes   Do you have a DNR form?     No                              If  not, do you want to discuss one?   Do you have signed POA/HPOA forms?  Yes                      If so, please bring to you appointment      Do you have any difficulty bathing or dressing yourself?  No   Do you have any difficulty preparing food or eating? No   Do you have any difficulty managing your medications? No   Do you have any difficulty managing your finances? Yes   Do you have any difficulty affording your medications? No   Social Determinants of Health   Financial Resource Strain: Not on file  Food Insecurity: No Food Insecurity (06/19/2022)   Hunger Vital Sign    Worried About Running Out of Food in the Last Year: Never true    Ran Out of Food in the Last Year: Never true  Transportation Needs: No Transportation Needs (06/19/2022)   PRAPARE - Hydrologist (Medical): No    Lack of Transportation (Non-Medical): No  Physical Activity: Not on file  Stress: Not on file  Social Connections: Not on file  Intimate Partner Violence: Not At Risk (06/19/2022)   Humiliation, Afraid, Rape, and Kick questionnaire    Fear of Current or Ex-Partner: No    Emotionally Abused: No    Physically Abused: No    Sexually Abused: No     Review of Systems    General:  No chills, fever, night sweats or weight changes.  Cardiovascular:  No chest pain, dyspnea on exertion, edema, orthopnea, palpitations, paroxysmal nocturnal dyspnea. Dermatological: No rash, lesions/masses Respiratory: No cough, dyspnea Urologic: No hematuria, dysuria Abdominal:   No nausea, vomiting, diarrhea, bright red blood per rectum, melena, or hematemesis Neurologic:  No visual changes, wkns, changes in mental status. All other systems reviewed and are otherwise negative except as noted above.  Physical Exam    VS:  There were no vitals taken for this visit. , BMI There is no height or weight on file to calculate BMI. GEN: Well nourished, well developed, in no acute distress. HEENT: normal. Neck: Supple, no JVD, carotid bruits, or masses. Cardiac: RRR, no murmurs, rubs, or gallops. No clubbing, cyanosis, edema.  Radials/DP/PT 2+ and equal bilaterally.  Respiratory:  Respirations  regular and unlabored, clear to auscultation bilaterally. GI: Soft, nontender, nondistended, BS + x 4. MS: no deformity or atrophy. Skin: warm and dry, no rash. Neuro:  Strength and sensation are intact. Psych: Normal affect.  Accessory Clinical Findings    Recent Labs: 12/14/2021: ALT 17; TSH 2.122 12/29/2021: Magnesium 1.9 06/18/2022: B Natriuretic Peptide 65.9; Hemoglobin 16.7; Platelets 200 06/22/2022: BUN 26; Creatinine, Ser 1.60; Potassium 4.0; Sodium 139   Recent Lipid Panel    Component Value Date/Time   CHOL 148 06/19/2022 1519   TRIG 247 (H) 06/19/2022 1519   HDL 59 06/19/2022 1519   CHOLHDL 2.5 06/19/2022 1519   VLDL 49 (H) 06/19/2022 1519   LDLCALC 40 06/19/2022 1519    No BP recorded.  {Refresh Note OR Click here to enter BP  :1}***    ECG personally reviewed by me today- *** - No acute changes  Echocardiogram 06/19/2022  IMPRESSIONS     1. Left ventricular ejection fraction, by estimation, is 65 to 70%. The  left ventricle has normal function. The left ventricle has no regional  wall motion abnormalities. Left ventricular diastolic parameters are  indeterminate.  2. Right ventricular systolic function is normal. The right ventricular  size is normal. Tricuspid regurgitation signal is inadequate for assessing  PA pressure.   3. The mitral valve is normal in structure. Trivial mitral valve  regurgitation. No evidence of mitral stenosis. Moderate mitral annular  calcification.   4. The aortic valve is tricuspid. There is moderate calcification of the  aortic valve. There is moderate thickening of the aortic valve. Aortic  valve regurgitation is not visualized. Moderate aortic valve stenosis.  Aortic valve area, by VTI measures  1.17 cm. Aortic valve Vmax measures 2.87 m/s. Aortic valve mean gradient  measures 17.0 mmHg. Aortic valve peak gradient measures 32.9      Although the mean AVG and peak velocity are in the mild range, the DVI  is low at 0.37  and SVI is low at 24. 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   5. There is borderline dilatation of the ascending aorta, measuring 37  mm.   6. The inferior vena cava is normal in size with greater than 50%  respiratory variability, suggesting right atrial pressure of 3 mmHg.   7. Compared to prior study dated 12/15/2021, mean AVG has decreased from  22.101mmHg to 66mmHg but AVA has decreased from 1.23cm2 to 1.17cm2, SVI has  dropped from 32 to 24 but DVI is the same at 0.37. Overall no significant  change in degree of AS.   FINDINGS   Left Ventricle: Left ventricular ejection fraction, by estimation, is 65  to 70%. The left ventricle has normal function. The left ventricle has no  regional wall motion abnormalities. Definity contrast agent was given IV  to delineate the left ventricular   endocardial borders. The left ventricular internal cavity size was normal  in size. There is no concentric left ventricular hypertrophy. Left  ventricular diastolic parameters are indeterminate. Normal left  ventricular filling pressure.   Right Ventricle: The right ventricular size is normal. No increase in  right ventricular wall thickness. Right ventricular systolic function is  normal. Tricuspid regurgitation signal is inadequate for assessing PA  pressure.   Left Atrium: Left atrial size was normal in size.   Right Atrium: Right atrial size was normal in size.   Pericardium: There is no evidence of pericardial effusion.   Mitral Valve: The mitral valve is normal in structure. Moderate mitral  annular calcification. Trivial mitral valve regurgitation. No evidence of  mitral valve stenosis.   Tricuspid Valve: The tricuspid valve is normal in structure. Tricuspid  valve regurgitation is not demonstrated. No evidence of tricuspid  stenosis.   Aortic Valve: The aortic valve is tricuspid. There is moderate  calcification of the aortic valve. There is moderate thickening  of the  aortic valve. Aortic valve regurgitation is not visualized. Moderate  aortic stenosis is present. Aortic valve mean  gradient measures 17.0 mmHg. Aortic valve peak gradient measures 32.9  mmHg. Aortic valve area, by VTI measures 1.17 cm.   Pulmonic Valve: The pulmonic valve was normal in structure. Pulmonic valve  regurgitation is not visualized. No evidence of pulmonic stenosis.   Aorta: The aortic root is normal in size and structure. There is  borderline dilatation of the ascending aorta, measuring 37 mm.   Venous: The inferior vena cava is normal in size with greater than 50%  respiratory variability, suggesting right atrial pressure of 3 mmHg.   IAS/Shunts: No atrial level shunt detected by color flow Doppler.     Assessment & Plan  1.  ***   Jossie Ng. Timisha Mondry NP-C     09/06/2022, 11:28 AM [place] [address] Office [phone] Fax [phone]    I spent***minutes examining this patient, reviewing medications, and using patient centered shared decision making involving her cardiac care.  Prior to her visit I spent greater than 20 minutes reviewing her past medical history,  medications, and prior cardiac tests.

## 2022-09-07 ENCOUNTER — Ambulatory Visit: Payer: Medicare Other | Attending: Cardiology | Admitting: General Practice

## 2022-09-07 DIAGNOSIS — R053 Chronic cough: Secondary | ICD-10-CM | POA: Diagnosis not present

## 2022-09-13 ENCOUNTER — Emergency Department (HOSPITAL_COMMUNITY)
Admission: EM | Admit: 2022-09-13 | Discharge: 2022-09-14 | Disposition: A | Discharge status: Home / Self Care | Payer: Medicare Other | Attending: Emergency Medicine | Admitting: Emergency Medicine

## 2022-09-13 ENCOUNTER — Emergency Department (HOSPITAL_COMMUNITY): Payer: Medicare Other

## 2022-09-13 ENCOUNTER — Encounter (HOSPITAL_COMMUNITY): Payer: Self-pay

## 2022-09-13 DIAGNOSIS — Z7901 Long term (current) use of anticoagulants: Secondary | ICD-10-CM | POA: Insufficient documentation

## 2022-09-13 DIAGNOSIS — S060X0A Concussion without loss of consciousness, initial encounter: Secondary | ICD-10-CM

## 2022-09-13 DIAGNOSIS — R0789 Other chest pain: Secondary | ICD-10-CM | POA: Diagnosis not present

## 2022-09-13 DIAGNOSIS — S0011XA Contusion of right eyelid and periocular area, initial encounter: Secondary | ICD-10-CM | POA: Insufficient documentation

## 2022-09-13 DIAGNOSIS — Z743 Need for continuous supervision: Secondary | ICD-10-CM | POA: Diagnosis not present

## 2022-09-13 DIAGNOSIS — R110 Nausea: Secondary | ICD-10-CM | POA: Diagnosis not present

## 2022-09-13 DIAGNOSIS — R011 Cardiac murmur, unspecified: Secondary | ICD-10-CM | POA: Diagnosis not present

## 2022-09-13 DIAGNOSIS — R079 Chest pain, unspecified: Secondary | ICD-10-CM

## 2022-09-13 DIAGNOSIS — R609 Edema, unspecified: Secondary | ICD-10-CM | POA: Diagnosis not present

## 2022-09-13 DIAGNOSIS — R0602 Shortness of breath: Secondary | ICD-10-CM | POA: Diagnosis not present

## 2022-09-13 DIAGNOSIS — S0511XA Contusion of eyeball and orbital tissues, right eye, initial encounter: Secondary | ICD-10-CM | POA: Diagnosis not present

## 2022-09-13 DIAGNOSIS — W01198A Fall on same level from slipping, tripping and stumbling with subsequent striking against other object, initial encounter: Secondary | ICD-10-CM | POA: Diagnosis not present

## 2022-09-13 DIAGNOSIS — S0990XA Unspecified injury of head, initial encounter: Secondary | ICD-10-CM | POA: Diagnosis present

## 2022-09-13 DIAGNOSIS — H05231 Hemorrhage of right orbit: Secondary | ICD-10-CM

## 2022-09-13 DIAGNOSIS — S0003XA Contusion of scalp, initial encounter: Secondary | ICD-10-CM | POA: Diagnosis not present

## 2022-09-13 DIAGNOSIS — R6889 Other general symptoms and signs: Secondary | ICD-10-CM | POA: Diagnosis not present

## 2022-09-13 LAB — COMPREHENSIVE METABOLIC PANEL
ALT: 27 U/L (ref 0–44)
AST: 28 U/L (ref 15–41)
Albumin: 3.7 g/dL (ref 3.5–5.0)
Alkaline Phosphatase: 69 U/L (ref 38–126)
Anion gap: 10 (ref 5–15)
BUN: 21 mg/dL (ref 8–23)
CO2: 27 mmol/L (ref 22–32)
Calcium: 10.2 mg/dL (ref 8.9–10.3)
Chloride: 102 mmol/L (ref 98–111)
Creatinine, Ser: 1.77 mg/dL — ABNORMAL HIGH (ref 0.61–1.24)
GFR, Estimated: 39 mL/min — ABNORMAL LOW (ref >60)
Glucose, Bld: 91 mg/dL (ref 70–99)
Potassium: 3.9 mmol/L (ref 3.5–5.1)
Sodium: 139 mmol/L (ref 135–145)
Total Bilirubin: 0.8 mg/dL (ref 0.3–1.2)
Total Protein: 6.9 g/dL (ref 6.5–8.1)

## 2022-09-13 LAB — CBC WITH DIFFERENTIAL/PLATELET
Abs Immature Granulocytes: 0.02 10*3/uL (ref 0.00–0.07)
Basophils Absolute: 0 10*3/uL (ref 0.0–0.1)
Basophils Relative: 0 %
Eosinophils Absolute: 0.2 10*3/uL (ref 0.0–0.5)
Eosinophils Relative: 2 %
HCT: 46.7 % (ref 39.0–52.0)
Hemoglobin: 15.4 g/dL (ref 13.0–17.0)
Immature Granulocytes: 0 %
Lymphocytes Relative: 16 %
Lymphs Abs: 1.2 10*3/uL (ref 0.7–4.0)
MCH: 29.1 pg (ref 26.0–34.0)
MCHC: 33 g/dL (ref 30.0–36.0)
MCV: 88.1 fL (ref 80.0–100.0)
Monocytes Absolute: 0.6 10*3/uL (ref 0.1–1.0)
Monocytes Relative: 9 %
Neutro Abs: 5.2 10*3/uL (ref 1.7–7.7)
Neutrophils Relative %: 73 %
Platelets: 183 10*3/uL (ref 150–400)
RBC: 5.3 MIL/uL (ref 4.22–5.81)
RDW: 16.7 % — ABNORMAL HIGH (ref 11.5–15.5)
WBC: 7.2 10*3/uL (ref 4.0–10.5)
nRBC: 0 % (ref 0.0–0.2)

## 2022-09-13 LAB — TROPONIN I (HIGH SENSITIVITY)
Troponin I (High Sensitivity): 24 ng/L — ABNORMAL HIGH (ref <18)
Troponin I (High Sensitivity): 24 ng/L — ABNORMAL HIGH (ref <18)

## 2022-09-13 LAB — BRAIN NATRIURETIC PEPTIDE: B Natriuretic Peptide: 74.4 pg/mL (ref 0.0–100.0)

## 2022-09-13 MED ORDER — ERYTHROMYCIN 5 MG/GM OP OINT
1.0000 | TOPICAL_OINTMENT | Freq: Once | OPHTHALMIC | Status: AC
  Administered 2022-09-13: 1 via OPHTHALMIC
  Filled 2022-09-13: qty 3.5

## 2022-09-13 MED ORDER — ACETAMINOPHEN 500 MG PO TABS
1000.0000 mg | ORAL_TABLET | Freq: Once | ORAL | Status: AC
  Administered 2022-09-13: 1000 mg via ORAL
  Filled 2022-09-13: qty 2

## 2022-09-13 NOTE — ED Provider Notes (Signed)
[facility] Provider Note   CSN: [id number] Arrival date & time: 09/13/22  [id number]     History {Add pertinent medical, surgical, social history, OB history to HPI:1} No chief complaint on file.   Johnny [name] is a 77 y.o. male.  HPI Tripped on a curb Sunday evening.  He fell forward and hit his face.  He does take Eliquis.  He did not come for evaluation at that time.  He reports that he had a large goose egg above his eye but he did not think it was that serious.  He reports now over the ensuing couple of days, the swelling has expanded quite a bit and his whole eye is swollen closed.  He reports since the fall he then started developing some dizziness and also nausea.  No focal weakness numbness or tingling.  He reports he is also concerned because he is having several episodes of chest pain this weekend.  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  He believes that several other prior physicians knew it needed to be repaired, but it never got done.  He confirms that his current cardiology group also does not recommend imminent aortic valve repair.  He reports this weekend he got chest pressure and he took 2 nitroglycerin on Saturday and he took 1 on Sunday.  He is not currently having any active chest pain or shortness of breath different from baseline.    Home Medications Prior to Admission medications   Medication Sig Start Date End Date Taking? Authorizing Provider  albuterol (VENTOLIN HFA) 108 (90 Base) MCG/ACT inhaler Inhale 1 puff into the lungs every 6 (six) hours as needed for wheezing or shortness of breath.    [provider]  apixaban (ELIQUIS) 5 MG TABS tablet Take 1 tablet (5 mg total) by mouth 2 (two) times daily. 06/22/22   Almyra Deforest, PA  carvedilol (COREG) 6.25 MG tablet Take 1 tablet (6.25 mg total) by mouth 2 (two) times daily. 09/02/22   Ngetich, Dinah C, NP  famotidine (PEPCID) 40 MG tablet Take 40 mg  by mouth daily.    [provider]  fluticasone (FLONASE) 50 MCG/ACT nasal spray Place 2 sprays into both nostrils 3 (three) times daily as needed for allergies or rhinitis.    [provider]  furosemide (LASIX) 40 MG tablet Take 1 tablet (40 mg total) by mouth every other day. 09/02/22   Ngetich, Dinah C, NP  isosorbide mononitrate (IMDUR) 30 MG 24 hr tablet Take 1 tablet (30 mg total) by mouth daily. 09/02/22   Ngetich, Dinah C, NP  lisinopril (ZESTRIL) 2.5 MG tablet Take 2.5 mg by mouth daily.    [provider]  loratadine (CLARITIN) 10 MG tablet Take 1 tablet (10 mg total) by mouth daily. 09/02/22   Ngetich, Dinah C, NP  montelukast (SINGULAIR) 10 MG tablet Take 10 mg by mouth daily as needed (for allergies).    [provider]  nitroGLYCERIN (NITROSTAT) 0.4 MG SL tablet Place 0.4 mg under the tongue every 5 (five) minutes as needed for chest pain.    [provider]  pantoprazole (PROTONIX) 40 MG tablet Take 1 tablet (40 mg total) by mouth daily before breakfast. 09/02/22   Ngetich, Dinah C, NP  rosuvastatin (CRESTOR) 40 MG tablet Take 1 tablet (40 mg total) by mouth daily. 09/02/22   Ngetich, Dinah C, NP  sodium chloride (OCEAN) 0.65 % SOLN nasal spray Place  1 spray into both nostrils as needed for congestion. 09/02/22   Ngetich, Dinah C, NP  TYLENOL 8 HOUR ARTHRITIS PAIN 650 MG CR tablet Take 650-1,300 mg by mouth every 8 (eight) hours as needed for pain.    [provider]  venlafaxine XR (EFFEXOR-XR) 150 MG 24 hr capsule Take 1 capsule (150 mg total) by mouth daily with breakfast. 09/02/22   Ngetich, Dinah C, NP      Allergies    Acacia and Metformin and related    Review of Systems   Review of Systems  Physical Exam Updated Vital Signs BP (!) 141/100 (BP Location: Right Arm)   Pulse 60   Temp 97.8 F (36.6 C)   Resp 16   Ht 5' 6.5" (1.689 m)   Wt 99.8 kg   SpO2 97%   BMI 34.98 kg/m  Physical Exam Constitutional:      Comments:  Alert nontoxic GCS 15.  No respiratory distress at rest.  HENT:     Head:     Comments: Large violaceous periorbital hematoma on the right.  Eye is swollen shut.  Patient cannot spontaneously open the eye.  Also swelling and some deformity at the nasal bridge    Mouth/Throat:     Pharynx: Oropharynx is clear.  Eyes:     Extraocular Movements: Extraocular movements intact.     Comments: I can manually open the right eye and completely visualize the cornea and pupil.    ED Results / Procedures / Treatments   Labs (all labs ordered are listed, but only abnormal results are displayed) Labs Reviewed  COMPREHENSIVE METABOLIC PANEL - Abnormal; Notable for the following components:      Result Value   Creatinine, Ser 1.77 (*)    GFR, Estimated 39 (*)    All other components within normal limits  CBC WITH DIFFERENTIAL/PLATELET - Abnormal; Notable for the following components:   RDW 16.7 (*)    All other components within normal limits  TROPONIN I (HIGH SENSITIVITY) - Abnormal; Notable for the following components:   Troponin I (High Sensitivity) 24 (*)    All other components within normal limits  BRAIN NATRIURETIC PEPTIDE  TROPONIN I (HIGH SENSITIVITY)    EKG None  Radiology CT Head Wo Contrast  Result Date: 09/13/2022 CLINICAL DATA:  Status post recent fall. EXAM: CT HEAD WITHOUT CONTRAST TECHNIQUE: Contiguous axial images were obtained from the base of the skull through the vertex without intravenous contrast. RADIATION DOSE REDUCTION: This exam was performed according to the departmental dose-optimization program which includes automated exposure control, adjustment of the mA and/or kV according to patient size and/or use of iterative reconstruction technique. COMPARISON:  None Available. FINDINGS: Brain: There is mild cerebral atrophy with widening of the extra-axial spaces and ventricular dilatation. There are areas of decreased attenuation within the white matter tracts of the  supratentorial brain, consistent with microvascular disease changes. Vascular: There is moderate severity calcification of the bilateral cavernous carotid arteries. Skull: Normal. Negative for fracture or focal lesion. Sinuses/Orbits: No acute finding. Other: There is marked severity right-sided facial, right periorbital, right preseptal and right frontal scalp soft tissue swelling. A 1.8 cm x 0.9 cm x 1.6 cm right frontal scalp hematoma is also noted. Mild to moderate severity left-sided facial and bilateral paranasal soft tissue swelling is also seen. IMPRESSION: 1. Marked severity right-sided facial, right periorbital, right preseptal and right frontal scalp soft tissue swelling with a 1.8 cm x 0.9 cm x 1.6 cm right frontal  scalp hematoma. 2. Mild to moderate severity left-sided facial and bilateral paranasal soft tissue swelling. 3. No acute intracranial abnormality. 4. Generalized cerebral atrophy with widening of the extra-axial spaces and ventricular dilatation. Electronically Signed   By: Virgina Norfolk M.D.   On: 09/13/2022 20:39   DG Chest Portable 1 View  Result Date: 09/13/2022 CLINICAL DATA:  Chest pain and shortness of breath for 1 day EXAM: PORTABLE CHEST 1 VIEW COMPARISON:  06/18/2022 and older FINDINGS: Hyperinflation. Sternal wires. Uppermost sternal wire is fractured. Left chest pacemaker with leads along the right side of the heart. Left shoulder arthroplasty. Screw along the coracoid. Overlapping cardiac leads. Lumbar fusion hardware seen at the very edge of the imaging field. No consolidation, pneumothorax or effusion. No edema. Normal cardiopericardial silhouette. Tortuous and ectatic aorta. IMPRESSION: Postop chest.  Pacemaker. Hyperinflation. Tortuous and ectatic aorta. Electronically Signed   By: Jill Side M.D.   On: 09/13/2022 19:57    Procedures Procedures  {Document cardiac monitor, telemetry assessment procedure when appropriate:1}  Medications Ordered in ED Medications  - No data to display  ED Course/ Medical Decision Making/ A&P   {   Click here for ABCD2, HEART and other calculatorsREFRESH Note before signing :1}                          Medical Decision Making  ***  {Document critical care time when appropriate:1} {Document review of labs and clinical decision tools ie heart score, Chads2Vasc2 etc:1}  {Document your independent review of radiology images, and any outside records:1} {Document your discussion with family members, caretakers, and with consultants:1} {Document social determinants of health affecting pt's care:1} {Document your decision making why or why not admission, treatments were needed:1} Final Clinical Impression(s) / ED Diagnoses Final diagnoses:  None    Rx / DC Orders ED Discharge Orders     None

## 2022-09-13 NOTE — ED Provider Triage Note (Signed)
Emergency Medicine Provider Triage Evaluation Note  Johnny [name] , a 77 y.o. male  was evaluated in triage.  Pt complains of chest pain and shortness of breath.  Patient reports that he has had shortness of breath and chest pain for the last 2 days.  He reports he had an episode in which he fell and hit the left side of his head on the ground 2 days ago.  Reports that he is on a blood thinner at this time.  Denies fever, abdominal pain, nausea, vomiting, diarrhea..  Review of Systems  Positive: As above Negative: As above  Physical Exam  BP 116/78   Pulse 60   Temp 97.6 F (36.4 C)   Resp 18   Ht 5' 6.5" (1.689 m)   Wt 99.8 kg   SpO2 99%   BMI 34.98 kg/m  Gen:   Awake, no distress   Resp:  Normal effort  MSK:   Moves extremities without difficulty  Other:  Significant ecchymosis along the right orbit with some periorbital swelling  Medical Decision Making  Medically screening exam initiated at 7:01 PM.  Appropriate orders placed.  Johnny [name] was informed that the remainder of the evaluation will be completed by another provider, this initial triage assessment does not replace that evaluation, and the importance of remaining in the ED until their evaluation is complete.     Luvenia Heller, PA-C 09/13/22 1902

## 2022-09-13 NOTE — ED Triage Notes (Addendum)
Pt bib ems from home; c/o cp and sob x 1 day; hx pacemaker, stent placement, CABG; also c/o dizziness, sob with exertion; pt tripped over curb Sunday night, hit face; not evaluated, on thinner; bruising and swelling to eyes and forehead; 140/100, HR 60 paced, 97% 2L (states has been wearing more lately, normally just wears at night); pt endorses increased lethargy since fall; NAD in triage; cp central and left, pressure, 5/10

## 2022-09-14 NOTE — ED Notes (Signed)
Patient verbalizes understanding of d/c instructions. Opportunities for questions and answers were provided. Pt d/c from ED and wheeled to lobby where he is waiting for cab to pick pt up. Lobby staff given cab voucher.

## 2022-09-14 NOTE — Discharge Instructions (Signed)
1.  Use the erythromycin ointment on your eye 3 times a day.  Use a warm moist cloth to clear away any drainage. 2.  Review instructions for concussion.  Schedule follow-up with your doctor soon as possible. 3.  There is no signs of heart attack today.  But it is important that you schedule follow-up with your cardiologist as soon as possible.

## 2022-09-15 ENCOUNTER — Encounter: Payer: Medicare Other | Admitting: Family

## 2022-09-15 ENCOUNTER — Encounter: Payer: Self-pay | Admitting: Family

## 2022-09-15 ENCOUNTER — Ambulatory Visit (INDEPENDENT_AMBULATORY_CARE_PROVIDER_SITE_OTHER): Payer: Medicare Other | Admitting: Family

## 2022-09-15 VITALS — BP 118/82 | HR 64 | Temp 97.4°F | Resp 18 | Ht 66.5 in | Wt 221.8 lb

## 2022-09-15 DIAGNOSIS — H05231 Hemorrhage of right orbit: Secondary | ICD-10-CM

## 2022-09-15 DIAGNOSIS — R059 Cough, unspecified: Secondary | ICD-10-CM

## 2022-09-15 DIAGNOSIS — W19XXXD Unspecified fall, subsequent encounter: Secondary | ICD-10-CM | POA: Diagnosis not present

## 2022-09-15 DIAGNOSIS — Y92009 Unspecified place in unspecified non-institutional (private) residence as the place of occurrence of the external cause: Secondary | ICD-10-CM | POA: Diagnosis not present

## 2022-09-15 DIAGNOSIS — R0989 Other specified symptoms and signs involving the circulatory and respiratory systems: Secondary | ICD-10-CM

## 2022-09-15 DIAGNOSIS — U071 COVID-19: Secondary | ICD-10-CM | POA: Diagnosis not present

## 2022-09-15 DIAGNOSIS — J069 Acute upper respiratory infection, unspecified: Secondary | ICD-10-CM

## 2022-09-15 LAB — POC COVID19 BINAXNOW: SARS Coronavirus 2 Ag: POSITIVE — AB

## 2022-09-15 MED ORDER — ZINC GLUCONATE 50 MG PO TABS: 50.0000 mg | ORAL_TABLET | Freq: Every day | ORAL | 0 refills | Status: AC

## 2022-09-15 MED ORDER — VITAMIN D3 50 MCG (2000 UT) PO CAPS: 2000.0000 [IU] | ORAL_CAPSULE | Freq: Every day | ORAL | 0 refills | Status: AC

## 2022-09-15 MED ORDER — NIRMATRELVIR/RITONAVIR (PAXLOVID) TABLET (RENAL DOSING): 2.0000 | ORAL_TABLET | Freq: Two times a day (BID) | ORAL | 0 refills | Status: AC

## 2022-09-15 MED ORDER — VITAMIN C 250 MG PO TABS: 250.0000 mg | ORAL_TABLET | Freq: Two times a day (BID) | ORAL | 0 refills | Status: AC

## 2022-09-15 MED ORDER — APIXABAN 5 MG PO TABS: ORAL_TABLET | ORAL | 3 refills | Status: DC

## 2022-09-15 NOTE — Progress Notes (Signed)
Provider: Marlowe Sax FNP-C  Jaelin Devincentis, Nelda Bucks, NP  Patient Care Team: Gurtaj Ruz, Nelda Bucks, NP as PCP - General (Family Medicine) Donato Heinz, MD as PCP - Cardiology (Cardiology)  Extended Emergency Contact Information Primary Emergency Contact: Posceo,Deborah Address: [address] [place] [address] Johnnette Litter of [place] Phone: [phone] Mobile Phone: [phone] Relation: Sister Preferred language: English Secondary Emergency Contact: Upham,Pattie Mobile Phone: [phone] Relation: Other  Code Status:  Full Code  Goals of care: Advanced Directive information    09/15/2022    3:09 PM  Advanced Directives  Does Patient Have a Medical Advance Directive? No  Would patient like information on creating a medical advance directive? No - Patient declined     Chief Complaint  Patient presents with   Transitions Of Care    TOC 09/13/2022-09/14/2022 for fall.    HPI:  Pt is a 77 y.o. male seen today for an acute visit for evaluation Transition of care post ED visit from 09/13/2022 to 09/14/2022 for fall.States fell on a curve outside his house on the parking lot.He fell on the face hitting right eye.No loss of consciousness.He was unable to get up by himself but crawled on his knees to where he could hold on to get up.  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  Has been using cold wash cloth over right eye swollen area.  He complains of runny nose,cough,fever and generalized body aches.states took tylenol last night since he felt like he was burning up despite having the fun on. Thinks he caught some infection while he was in  the hospital.  He denies any nausea or vomiting.   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[place], [place]   Past Surgical History:  Procedure Laterality Date   BACK SURGERY  1975   Lower Back Surgery, Dr.Seltzer   BACK SURGERY  1985   Dr.Seltzer   SHOULDER SURGERY Left 1965   Dr.Seltzer   TONSILLECTOMY  1950   Dr.Perellie    Allergies  Allergen Reactions   Acacia Itching   Metformin And Related Other (See Comments)    Metformin caused the patient's legs to become weak    Outpatient Encounter Medications as of 09/15/2022  Medication Sig   albuterol (VENTOLIN HFA) 108 (90 Base) MCG/ACT inhaler Inhale 1 puff into the lungs every 6 (six) hours as needed for wheezing or shortness of breath.   apixaban (ELIQUIS) 5 MG TABS tablet Take 1 tablet (5 mg total) by mouth 2 (two) times daily.   carvedilol (COREG) 6.25 MG tablet Take 1 tablet (6.25 mg total) by mouth 2 (two) times daily.   famotidine (PEPCID) 40 MG tablet Take 40 mg by mouth daily.   fluticasone (FLONASE) 50 MCG/ACT nasal spray Place 2 sprays into both nostrils 3 (three) times daily as needed for allergies or rhinitis.   furosemide (LASIX) 40 MG tablet Take 1 tablet (40 mg total) by mouth every other day.   isosorbide mononitrate (IMDUR) 30 MG 24 hr tablet Take 1 tablet (30  mg total) by mouth daily.   lisinopril (ZESTRIL) 2.5 MG tablet Take 2.5 mg by mouth daily.   loratadine (CLARITIN) 10 MG tablet Take 1 tablet (10 mg total) by mouth daily.   montelukast (SINGULAIR) 10 MG tablet Take 10 mg by mouth daily as needed (for allergies).   nitroGLYCERIN (NITROSTAT) 0.4 MG SL tablet Place 0.4 mg under the tongue every 5 (five) minutes as needed for chest pain.   pantoprazole (PROTONIX) 40 MG tablet Take 1 tablet (40 mg total) by mouth daily before breakfast.   rosuvastatin (CRESTOR) 40 MG tablet Take 1 tablet (40  mg total) by mouth daily.   sodium chloride (OCEAN) 0.65 % SOLN nasal spray Place 1 spray into both nostrils as needed for congestion.   TYLENOL 8 HOUR ARTHRITIS PAIN 650 MG CR tablet Take 650-1,300 mg by mouth every 8 (eight) hours as needed for pain.   venlafaxine XR (EFFEXOR-XR) 150 MG 24 hr capsule Take 1 capsule (150 mg total) by mouth daily with breakfast.   No facility-administered encounter medications on file as of 09/15/2022.    Review of Systems  Constitutional:  Positive for fever. Negative for appetite change, chills, fatigue and unexpected weight change.       Generalized body aches   HENT:  Positive for congestion, hearing loss, rhinorrhea and sinus pressure. Negative for dental problem, ear discharge, ear pain, facial swelling, nosebleeds, postnasal drip, sinus pain, sneezing, sore throat, tinnitus and trouble swallowing.   Eyes:  Negative for pain, discharge, redness, itching and visual disturbance.  Respiratory:  Positive for cough. Negative for chest tightness, shortness of breath and wheezing.   Cardiovascular:  Negative for chest pain, palpitations and leg swelling.  Gastrointestinal:  Negative for abdominal distention, abdominal pain, blood in stool, constipation, diarrhea, nausea and vomiting.  Endocrine: Negative for cold intolerance, heat intolerance, polydipsia, polyphagia and polyuria.  Genitourinary:  Negative for difficulty urinating, dysuria, flank pain, frequency, hematuria and urgency.  Musculoskeletal:  Positive for arthralgias, gait problem and myalgias. Negative for back pain, joint swelling, neck pain and neck stiffness.  Skin:  Negative for color change, pallor, rash and wound.       Right periorbital purple bruise and swelling  Neurological:  Negative for dizziness, syncope, speech difficulty, weakness, light-headedness, numbness and headaches.  Hematological:  Does not bruise/bleed easily.  Psychiatric/Behavioral:  Negative for agitation, behavioral  problems, confusion, hallucinations and sleep disturbance. The patient is not nervous/anxious.     Immunization History  Administered Date(s) Administered   Fluad Quad(high Dose 65+) 05/06/2020, 06/22/2022   Influenza Split 09/15/2009, 05/03/2013   Influenza, High Dose Seasonal PF 06/05/2014, 04/20/2015, 06/08/2016, 04/22/2017, 04/15/2021   Influenza-Unspecified 07/14/2004, 05/07/2007, 05/02/2008, 05/01/2018, 04/29/2019   PPD Test 06/08/2021   Pneumococcal Conjugate-13 06/05/2014   Pneumococcal Polysaccharide-23 08/13/2012   Pertinent  Health Maintenance Due  Topic Date Due   FOOT EXAM  Never done   OPHTHALMOLOGY EXAM  Never done   HEMOGLOBIN A1C  12/18/2022   INFLUENZA VACCINE  Completed      06/21/2022    9:00 AM 06/21/2022    8:00 PM 08/03/2022    1:03 PM 08/22/2022    1:42 PM 09/15/2022    3:09 PM  Fall Risk  Falls in the past year?   0 0 1  Was there an injury with Fall?   0 0 1  Fall Risk Category Calculator   0 0 2  Fall Risk Category (Retired)   Low    ([place]) Patient Fall Risk  Level High fall risk High fall risk Low fall risk    Patient at Risk for Falls Due to   No Fall Risks No Fall Risks History of fall(s);Impaired balance/gait  Fall risk Follow up   Falls evaluation completed Falls evaluation completed Falls evaluation completed;Education provided;Falls prevention discussed   Functional Status Survey:    Vitals:   09/15/22 1508  BP: 118/82  Pulse: 64  Resp: 18  Temp: (!) 97.4 F (36.3 C)  SpO2: 92%  Weight: 221 lb 12.8 oz (100.6 kg)  Height: 5' 6.5" (1.689 m)   Body mass index is 35.26 kg/m. Physical Exam Vitals reviewed.  Constitutional:      General: He is not in acute distress.    Appearance: Normal appearance. He is obese. He is not ill-appearing or diaphoretic.  HENT:     Head: Normocephalic.     Right Ear: Tympanic membrane, ear canal and external ear normal. There is no impacted cerumen.     Left Ear: Tympanic membrane, ear canal and  external ear normal. There is no impacted cerumen.     Nose: Congestion and rhinorrhea present.     Mouth/Throat:     Mouth: Mucous membranes are moist.     Pharynx: Oropharynx is clear. No oropharyngeal exudate or posterior oropharyngeal erythema.  Eyes:     General: No scleral icterus.       Right eye: No discharge.        Left eye: No discharge.     Extraocular Movements: Extraocular movements intact.     Conjunctiva/sclera: Conjunctivae normal.     Pupils: Pupils are equal, round, and reactive to light.  Neck:     Vascular: No carotid bruit.  Cardiovascular:     Rate and Rhythm: Normal rate and regular rhythm.     Pulses: Normal pulses.     Heart sounds: Murmur heard.     No friction rub. No gallop.  Pulmonary:     Effort: Pulmonary effort is normal. No respiratory distress.     Breath sounds: Normal breath sounds. No wheezing, rhonchi or rales.  Chest:     Chest wall: No tenderness.  Abdominal:     General: Bowel sounds are normal. There is no distension.     Palpations: Abdomen is soft. There is no mass.     Tenderness: There is no abdominal tenderness. There is no right CVA tenderness, left CVA tenderness, guarding or rebound.  Musculoskeletal:        General: No swelling or tenderness. Normal range of motion.     Cervical back: Normal range of motion. No rigidity or tenderness.     Right lower leg: No edema.     Left lower leg: No edema.     Comments: Unsteady gait   Lymphadenopathy:     Cervical: No cervical adenopathy.  Skin:    General: Skin is warm and dry.     Coloration: Skin is not pale.     Findings: No bruising, erythema, lesion or rash.     Comments: Right periorbital purple bruise with swelling over eyebrow.tender to touch.   Neurological:     Mental Status: He is alert and oriented to person, place, and time.     Cranial Nerves: No cranial nerve deficit.     Sensory: No sensory deficit.     Motor: No weakness.     Coordination: Coordination normal.      Gait: Gait abnormal.     Comments: Ambulates with a cane  Psychiatric:        Mood and Affect: Mood normal.        Speech: Speech normal.        Behavior: Behavior normal.        Thought Content: Thought content normal.        Judgment: Judgment normal.     Labs reviewed: Recent Labs    12/29/21 0944 06/18/22 1544 06/21/22 0214 06/22/22 0337 09/13/22 1907  NA 141   < > 138 139 139  K 4.9   < > 3.7 4.0 3.9  CL 104   < > 100 101 102  CO2 24   < > 29 27 27  $ GLUCOSE 97   < > 106* 89 91  BUN 25   < > 25* 26* 21  CREATININE 1.41*   < > 1.68* 1.60* 1.77*  CALCIUM 10.3*   < > 10.4* 10.5* 10.2  MG 1.9  --   --   --   --    < > = values in this interval not displayed.   Recent Labs    12/14/21 1353 09/13/22 1907  AST 19 28  ALT 17 27  ALKPHOS 93 69  BILITOT 1.1 0.8  PROT 7.9 6.9  ALBUMIN 4.3 3.7   Recent Labs    12/29/21 0944 06/18/22 1544 09/13/22 1907  WBC 9.3 7.9 7.2  NEUTROABS  --   --  5.2  HGB 15.2 16.7 15.4  HCT 44.7 51.0 46.7  MCV 82 86.3 88.1  PLT 232 200 183   Lab Results  Component Value Date   TSH 2.122 12/14/2021   Lab Results  Component Value Date   HGBA1C 6.0 (H) 06/19/2022   Lab Results  Component Value Date   CHOL 148 06/19/2022   HDL 59 06/19/2022   LDLCALC 40 06/19/2022   TRIG 247 (H) 06/19/2022   CHOLHDL 2.5 06/19/2022    Significant Diagnostic Results in last 30 days:  CT Head Wo Contrast  Result Date: 09/13/2022 CLINICAL DATA:  Status post recent fall. EXAM: CT HEAD WITHOUT CONTRAST TECHNIQUE: Contiguous axial images were obtained from the base of the skull through the vertex without intravenous contrast. RADIATION DOSE REDUCTION: This exam was performed according to the departmental dose-optimization program which includes automated exposure control, adjustment of the mA and/or kV according to patient size and/or use of iterative reconstruction technique. COMPARISON:  None Available. FINDINGS: Brain: There is mild cerebral  atrophy with widening of the extra-axial spaces and ventricular dilatation. There are areas of decreased attenuation within the white matter tracts of the supratentorial brain, consistent with microvascular disease changes. Vascular: There is moderate severity calcification of the bilateral cavernous carotid arteries. Skull: Normal. Negative for fracture or focal lesion. Sinuses/Orbits: No acute finding. Other: There is marked severity right-sided facial, right periorbital, right preseptal and right frontal scalp soft tissue swelling. A 1.8 cm x 0.9 cm x 1.6 cm right frontal scalp hematoma is also noted. Mild to moderate severity left-sided facial and bilateral paranasal soft tissue swelling is also seen. IMPRESSION: 1. Marked severity right-sided facial, right periorbital, right preseptal and right frontal scalp soft tissue swelling with a 1.8 cm x 0.9 cm x 1.6 cm right frontal scalp hematoma. 2. Mild to moderate severity left-sided facial and bilateral paranasal soft tissue swelling. 3. No acute intracranial abnormality. 4. Generalized cerebral atrophy with widening of the extra-axial spaces and ventricular dilatation. Electronically Signed   By: Virgina Norfolk M.D.   On: 09/13/2022 20:39  DG Chest Portable 1 View  Result Date: 09/13/2022 CLINICAL DATA:  Chest pain and shortness of breath for 1 day EXAM: PORTABLE CHEST 1 VIEW COMPARISON:  06/18/2022 and older FINDINGS: Hyperinflation. Sternal wires. Uppermost sternal wire is fractured. Left chest pacemaker with leads along the right side of the heart. Left shoulder arthroplasty. Screw along the coracoid. Overlapping cardiac leads. Lumbar fusion hardware seen at the very edge of the imaging field. No consolidation, pneumothorax or effusion. No edema. Normal cardiopericardial silhouette. Tortuous and ectatic aorta. IMPRESSION: Postop chest.  Pacemaker. Hyperinflation. Tortuous and ectatic aorta. Electronically Signed   By: Jill Side M.D.   On: 09/13/2022  19:57    Assessment/Plan 1. Fall at home, subsequent encounter Reports fall at the curve of the parking lot at home face down hitting eyebrow.did not go to ED immediately but late went when swelling worsen.will recheck lab work. - Fall and safety precaution advised.  - CBC with Differential/Platelet - Basic metabolic panel  2. Periorbital hematoma of right eye S/p ED visit post fall at home.periorbital purple bruise with hematoma on eyebrow.  - continue to apply cold compressor to keep swelling down. - OTC tylenol as needed for pain  3. Runny nose Advised to take Loratadine 10 mg tablet daily  - POC COVID-19  4. Cough, unspecified type Bilateral lungs clear  - OTC mucinex 600 mg tablet as needed for cough  - POC COVID-19 positive will start on Paxlovid   5. COVID-19 virus RNA test result positive at limit of detection - POC COVID-19 positive today will start on Paxlovid  - Also start on Vit D ,Zinc and C for supportive care  - encourage to increase fluid intake - Cholecalciferol (VITAMIN D3) 50 MCG (2000 UT) CAPS; Take 1 capsule (2,000 Units total) by mouth daily for 14 days.  Dispense: 14 capsule; Refill: 0 - zinc gluconate 50 MG tablet; Take 1 tablet (50 mg total) by mouth daily for 14 days.  Dispense: 14 tablet; Refill: 0 - vitamin C (ASCORBIC ACID) 250 MG tablet; Take 1 tablet (250 mg total) by mouth 2 (two) times daily for 14 days.  Dispense: 28 tablet; Refill: 0 - nirmatrelvir/ritonavir, renal dosing, (PAXLOVID) 10 x 150 MG & 10 x 100MG TABS; Take 2 tablets by mouth 2 (two) times daily for 5 days. (Take nirmatrelvir 150 mg one tablet twice daily for 5 days and ritonavir 100 mg one tablet twice daily for 5 days) Patient GFR is 39  Dispense: 20 tablet; Refill: 0  - Advised to Hold Rosuvastatin 40 mg tablet for 5 days while on Paxlovid then resume after completing Paxlovid.    - advised to reduce Apixban from 5 mg tablet to 2.5 mg tablet one by mouth twice daily x 5 days then  resume 5 mg tablet twice daily after completing paxlovid.   6. Upper respiratory tract infection due to COVID-19 virus Rhinorrhea,cough and generalized body aches.tested positive for COVID-19 today.  - Cholecalciferol (VITAMIN D3) 50 MCG (2000 UT) CAPS; Take 1 capsule (2,000 Units total) by mouth daily for 14 days.  Dispense: 14 capsule; Refill: 0 - zinc gluconate 50 MG tablet; Take 1 tablet (50 mg total) by mouth daily for 14 days.  Dispense: 14 tablet; Refill: 0 - vitamin C (ASCORBIC ACID) 250 MG tablet; Take 1 tablet (250 mg total) by mouth 2 (two) times daily for 14 days.  Dispense: 28 tablet; Refill: 0 - nirmatrelvir/ritonavir, renal dosing, (PAXLOVID) 10 x 150 MG & 10 x 100MG TABS; Take  2 tablets by mouth 2 (two) times daily for 5 days. (Take nirmatrelvir 150 mg one tablet twice daily for 5 days and ritonavir 100 mg one tablet twice daily for 5 days) Patient GFR is 39  Dispense: 20 tablet; Refill: 0  Family/ staff Communication: Reviewed plan of care with patient verbalized understanding.   Labs/tests ordered:  - POC COVID-19 - CBC with Differential/Platelet - Basic metabolic panel  Next Appointment: Return if symptoms worsen or fail to improve.   Sandrea Hughs, NP

## 2022-09-15 NOTE — Patient Instructions (Signed)
-   Please Hold Rosuvastatin 40 mg tablet for 5 days while taking Paxlovid then resume after completing paxlovid. - Take Zinc 50 mg tablet one by mouth daily for 14 days  - Take Vitamin C 250 mg tablet one by mouth twice daily x 14 days - Increase vitamin D to 2000 units daily x 14 days then resume 1000 units daily - Tylenol as needed for fever or body aches  -  Mucinex 600 mg tablet one twice daily for cough  - increase your fruits intake in your diet  - increase your water intake to 6-8 glasses of water daily  - Notify provider or go to ED if you develop any chest tightness,chest pain or shortness of breath  - Continue to practice social distance,self quarantine for 5 days,wear facial mask,hand hygiene and wipe counter top surfaces per CDC guidelines

## 2022-09-16 LAB — CBC WITH DIFFERENTIAL/PLATELET
Absolute Monocytes: 575 cells/uL (ref 200–950)
Basophils Absolute: 21 cells/uL (ref 0–200)
Basophils Relative: 0.3 %
Eosinophils Absolute: 43 cells/uL (ref 15–500)
Eosinophils Relative: 0.6 %
HCT: 43.7 % (ref 38.5–50.0)
Hemoglobin: 14.6 g/dL (ref 13.2–17.1)
Lymphs Abs: 504 cells/uL — ABNORMAL LOW (ref 850–3900)
MCH: 28.8 pg (ref 27.0–33.0)
MCHC: 33.4 g/dL (ref 32.0–36.0)
MCV: 86.2 fL (ref 80.0–100.0)
MPV: 11.2 fL (ref 7.5–12.5)
Monocytes Relative: 8.1 %
Neutro Abs: 5957 cells/uL (ref 1500–7800)
Neutrophils Relative %: 83.9 %
Platelets: 156 10*3/uL (ref 140–400)
RBC: 5.07 10*6/uL (ref 4.20–5.80)
RDW: 15.8 % — ABNORMAL HIGH (ref 11.0–15.0)
Total Lymphocyte: 7.1 %
WBC: 7.1 10*3/uL (ref 3.8–10.8)

## 2022-09-16 LAB — BASIC METABOLIC PANEL
BUN/Creatinine Ratio: 14 (calc) (ref 6–22)
BUN: 25 mg/dL (ref 7–25)
CO2: 21 mmol/L (ref 20–32)
Calcium: 9.9 mg/dL (ref 8.6–10.3)
Chloride: 103 mmol/L (ref 98–110)
Creat: 1.84 mg/dL — ABNORMAL HIGH (ref 0.70–1.28)
Glucose, Bld: 79 mg/dL (ref 65–99)
Potassium: 4 mmol/L (ref 3.5–5.3)
Sodium: 140 mmol/L (ref 135–146)

## 2022-09-19 DIAGNOSIS — U071 COVID-19: Secondary | ICD-10-CM | POA: Diagnosis not present

## 2022-09-19 NOTE — Progress Notes (Deleted)
Cardiology Office Note:    Date:  09/19/2022   ID:  Johnny [name], DOB Oct 04, 1945, MRN [id number]  PCP:  Sandrea Hughs, NP   [facility] Prov[facility] Cardiologist:  Donato Heinz, MD { Click to update primary MD,subspecialty MD or APP then REFRESH:1}    Referring MD: Ngetich, Nelda Bucks, NP   No chief complaint on file. ***  History of Present Illness:    Johnny [name] is a 77 y.o. male with a hx of CAD s/p CABG 2015 (in [place]), aortic stenosis, chronic diastolic heart failure, atrial fibrillation (amiodarone and apixaban), hypertension, CKD stage IIIa, OSA, HLD.  He relocated to [place] from [place] in February 2023.  Pacemaker was inserted in September 2022 in [place], he had issues with a superficial infection at the pacemaker insertion site.   He was admitted in May 2023 with chest pain, echocardiogram showed EF 55 to 60%, mild to moderate LA enlargement, moderate AS.  Lexiscan on 12/16/2021 showed no ischemia.  Most recently he was evaluated by Dr. Sallyanne Kuster in July 2023, at that time he was doing okay from a cardiac perspective, he was still waiting to get his BiPAP equipment and was having substantial daytime hypersomnolence.  He was admitted in November 2023 with chest pain, and gradual onset DOE over the last several months.  He had been started on home oxygen 2 months prior.  He was found to have AKI, troponins were borderline on.  Echocardiogram on 06/19/2022 showed EF 65 to 70%, trivial MR, moderate AS, borderline dilated ascending aorta.  Lisinopril and Lasix were held due to AKI.  His amiodarone was stopped during the hospitalization as patient had been maintaining sinus rhythm and there was some concern this was contributing to his shortness of breath.   He presents today    Repeat echo scheduled for May 2024, CBC and be met okay on 09/15/2022  Past Medical History:  Diagnosis Date   Atrial fibrillation ([place])    Deafness in left ear     Hypertension    Symptomatic bradycardia 04/2021   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[place], [place]    Past Surgical History:  Procedure Laterality Date   [place]   Lower Back Surgery, [place]   Dr.Perellie    Current Medications: No outpatient medications have been marked as taking for the 09/20/22 encounter (Appointment) with Loel Dubonnet, NP.     Allergies:   Acacia and Metformin and related   Social History   Socioeconomic History   Marital status: Widowed    Spouse name: Not on file   Number of children: Not on file   Years of education: Not on file   Highest education level: Not on file  Occupational History   Not on file  Tobacco Use   Smoking status: Former    Packs/day: 1.00    Years: 10.00    Total pack years: 10.00    Types: Cigarettes   Smokeless tobacco: Never  Substance and Sexual Activity   Alcohol use: Never   Drug use: Never   Sexual activity: Not on file  Other Topics Concern   Not on file  Social History Narrative   Tobacco use, amount per day now: None   Past tobacco use, amount per day: 1 pack   How many years did you use tobacco: 10  years.   Alcohol use (drinks per week): None   Diet:   Do you drink/eat things with caffeine: Yes   Marital status:  Widow                                What year were you married? 1972   Do you live in a house, apartment, assisted living, condo, trailer, etc.? House   Is it one or more stories? One   How many persons live in your home? One   Do you have pets in your home?( please list) No   Highest Level of education completed? 12th grade.   Current or past profession: Building control surveyor, Biomedical scientist, Therapist, art.    Do you exercise?   No                               Type and how often?   Do you have a living will? Yes   Do you have a DNR form?     No                              If not, do you want to discuss one?   Do  you have signed POA/HPOA forms?  Yes                      If so, please bring to you appointment      Do you have any difficulty bathing or dressing yourself? No   Do you have any difficulty preparing food or eating? No   Do you have any difficulty managing your medications? No   Do you have any difficulty managing your finances? Yes   Do you have any difficulty affording your medications? No   Social Determinants of Health   Financial Resource Strain: Not on file  Food Insecurity: No Food Insecurity (06/19/2022)   Hunger Vital Sign    Worried About Running Out of Food in the Last Year: Never true    Ran Out of Food in the Last Year: Never true  Transportation Needs: No Transportation Needs (06/19/2022)   PRAPARE - Hydrologist (Medical): No    Lack of Transportation (Non-Medical): No  Physical Activity: Not on file  Stress: Not on file  Social Connections: Not on file     Family History: The patient's ***family history includes Cancer in his father; Stroke in his brother and sister.  ROS:   Please see the history of present illness.    *** All other systems reviewed and are negative.  EKGs/Labs/Other Studies Reviewed:    The following studies were reviewed today: ***  EKG:  EKG is *** ordered today.  The ekg ordered today demonstrates ***  Recent Labs: 12/14/2021: TSH 2.122 12/29/2021: Magnesium 1.9 09/13/2022: ALT 27; B Natriuretic Peptide 74.4 09/15/2022: BUN 25; Creat 1.84; Hemoglobin 14.6; Platelets 156; Potassium 4.0; Sodium 140  Recent Lipid Panel    Component Value Date/Time   CHOL 148 06/19/2022 1519   TRIG 247 (H) 06/19/2022 1519   HDL 59 06/19/2022 1519   CHOLHDL 2.5 06/19/2022 1519   VLDL 49 (H) 06/19/2022 1519   LDLCALC 40 06/19/2022 1519     Risk Assessment/Calculations:   {Does this patient have ATRIAL FIBRILLATION?:[id number]}  No BP recorded.  {Refresh Note OR  Click here to enter BP  :1}***         Physical Exam:     VS:  There were no vitals taken for this visit.    Wt Readings from Last 3 Encounters:  09/15/22 221 lb 12.8 oz (100.6 kg)  09/13/22 220 lb (99.8 kg)  08/03/22 220 lb (99.8 kg)     GEN: *** Well nourished, well developed in no acute distress HEENT: Normal NECK: No JVD; No carotid bruits LYMPHATICS: No lymphadenopathy CARDIAC: ***RRR, no murmurs, rubs, gallops RESPIRATORY:  Clear to auscultation without rales, wheezing or rhonchi  ABDOMEN: Soft, non-tender, non-distended MUSCULOSKELETAL:  No edema; No deformity  SKIN: Warm and dry NEUROLOGIC:  Alert and oriented x 3 PSYCHIATRIC:  Normal affect   ASSESSMENT:    1. Chronic diastolic HF (heart failure) ([place])   2. SSS (sick sinus syndrome) (H[facility])   3. Paroxysmal atrial fibrillation ([facility])   4. Stage 3b chronic kidney disease ([place])   5. Coronary artery disease involving native coronary artery of native heart without angina pectoris   6. Hypercholesterolemia   7. Aortic valve stenosis, etiology of cardiac valve disease unspecified    PLAN:    In order of problems listed above:  ***      {Are you ordering a CV Procedure (e.g. stress test, cath, DCCV, TEE, etc)?   Press F2        :[id number]    Medication Adjustments/Labs and Tests Ordered: Current medicines are reviewed at length with the patient today.  Concerns regarding medicines are outlined above.  No orders of the defined types were placed in this encounter.  No orders of the defined types were placed in this encounter.   There are no Patient Instructions on file for this visit.   Signed, Trudi Ida, NP  09/19/2022 12:57 PM    [place]

## 2022-09-20 ENCOUNTER — Ambulatory Visit (HOSPITAL_BASED_OUTPATIENT_CLINIC_OR_DEPARTMENT_OTHER): Payer: Medicare Other | Admitting: Cardiology

## 2022-09-20 DIAGNOSIS — I350 Nonrheumatic aortic (valve) stenosis: Secondary | ICD-10-CM

## 2022-09-20 DIAGNOSIS — E7800 Pure hypercholesterolemia, unspecified: Secondary | ICD-10-CM

## 2022-09-20 DIAGNOSIS — N1832 Chronic kidney disease, stage 3b: Secondary | ICD-10-CM

## 2022-09-20 DIAGNOSIS — I5032 Chronic diastolic (congestive) heart failure: Secondary | ICD-10-CM

## 2022-09-20 DIAGNOSIS — I495 Sick sinus syndrome: Secondary | ICD-10-CM

## 2022-09-20 DIAGNOSIS — I2510 Atherosclerotic heart disease of native coronary artery without angina pectoris: Secondary | ICD-10-CM

## 2022-09-20 DIAGNOSIS — I480 Paroxysmal atrial fibrillation: Secondary | ICD-10-CM

## 2022-09-21 NOTE — Progress Notes (Signed)
This encounter was created in error - please disregard.

## 2022-09-22 DIAGNOSIS — U071 COVID-19: Secondary | ICD-10-CM | POA: Diagnosis not present

## 2022-09-27 ENCOUNTER — Encounter: Payer: Self-pay | Admitting: Nurse Practitioner

## 2022-09-27 ENCOUNTER — Telehealth: Payer: Self-pay

## 2022-09-27 ENCOUNTER — Telehealth (INDEPENDENT_AMBULATORY_CARE_PROVIDER_SITE_OTHER): Payer: Medicare Other | Admitting: Nurse Practitioner

## 2022-09-27 VITALS — Ht 66.5 in | Wt 221.0 lb

## 2022-09-27 DIAGNOSIS — R058 Other specified cough: Secondary | ICD-10-CM

## 2022-09-27 NOTE — Progress Notes (Signed)
Careteam: Patient Care Team: Ngetich, Nelda Bucks, NP as PCP - General (Family Medicine) Donato Heinz, MD as PCP - Cardiology (Cardiology)  Advanced Directive information    Allergies  Allergen Reactions   Acacia Itching   Metformin And Related Other (See Comments)    Metformin caused the patient's legs to become weak    Chief Complaint  Patient presents with   Acute Visit    Patient is covid positive with strong cough and hacking , residual side effects     HPI: Patient is a 77 y.o. male due to positive covid with ongoing cough.  Took paxlovid but still has ongoing cough.  Has not been sleeping due to cough.  Reports a lot of sputum.  No fever  Reports he is on chronic oxygen but not wearing at this time.  Reports overall he feels good.  Review of Systems:  Review of Systems  Constitutional:  Negative for chills, fever and weight loss.  HENT:  Positive for congestion. Negative for sore throat and tinnitus.   Respiratory:  Positive for cough. Negative for sputum production and shortness of breath.   Cardiovascular:  Negative for chest pain, palpitations and leg swelling.  Skin: Negative.   Neurological:  Negative for dizziness and headaches.  Psychiatric/Behavioral:  Negative for depression and memory loss. The patient has insomnia.     Past Medical History:  Diagnosis Date   Atrial fibrillation ([place])    Deafness in left ear    Hypertension    Symptomatic bradycardia 04/2021   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[place], [place]   Past Surgical History:  Procedure Laterality Date   BACK SURGERY  1975   Lower Back Surgery, [place]   Dr.Seltzer   SHOULDER SURGERY Left 1965   Dr.Seltzer   TONSILLECTOMY  1950   Dr.Perellie   Social History:   reports that he has quit smoking. His smoking use included cigarettes. He has a 10.00 pack-year smoking history. He has never used smokeless tobacco. He reports that he does not drink alcohol and does not use  drugs.  Family History  Problem Relation Age of Onset   Cancer Father    Stroke Sister    Stroke Brother     Medications: Patient's Medications  New Prescriptions   No medications on file  Previous Medications   ALBUTEROL (VENTOLIN HFA) 108 (90 BASE) MCG/ACT INHALER    Inhale 1 puff into the lungs every 6 (six) hours as needed for wheezing or shortness of breath.   APIXABAN (ELIQUIS) 5 MG TABS TABLET    Take 2.5 mg tablet one by mouth twice daily x 5 days then resume 5 mg tablet one by mouth twice daily when done with Paxlovid.   CARVEDILOL (COREG) 6.25 MG TABLET    Take 1 tablet (6.25 mg total) by mouth 2 (two) times daily.   CHOLECALCIFEROL (VITAMIN D3) 50 MCG (2000 UT) CAPS    Take 1 capsule (2,000 Units total) by mouth daily for 14 days.   FAMOTIDINE (PEPCID) 40 MG TABLET    Take 40 mg by mouth daily.   FLUTICASONE (FLONASE) 50 MCG/ACT NASAL SPRAY    Place 2 sprays into both nostrils 3 (three) times daily as needed for allergies or rhinitis.   FUROSEMIDE (LASIX) 40 MG TABLET    Take 1 tablet (40 mg total) by mouth every other day.   ISOSORBIDE MONONITRATE (IMDUR) 30 MG 24 HR TABLET    Take 1 tablet (  30 mg total) by mouth daily.   LISINOPRIL (ZESTRIL) 2.5 MG TABLET    Take 2.5 mg by mouth daily.   LORATADINE (CLARITIN) 10 MG TABLET    Take 1 tablet (10 mg total) by mouth daily.   MONTELUKAST (SINGULAIR) 10 MG TABLET    Take 10 mg by mouth daily as needed (for allergies).   NITROGLYCERIN (NITROSTAT) 0.4 MG SL TABLET    Place 0.4 mg under the tongue every 5 (five) minutes as needed for chest pain.   PANTOPRAZOLE (PROTONIX) 40 MG TABLET    Take 1 tablet (40 mg total) by mouth daily before breakfast.   ROSUVASTATIN (CRESTOR) 40 MG TABLET    Take 1 tablet (40 mg total) by mouth daily.   SODIUM CHLORIDE (OCEAN) 0.65 % SOLN NASAL SPRAY    Place 1 spray into both nostrils as needed for congestion.   TYLENOL 8 HOUR ARTHRITIS PAIN 650 MG CR TABLET    Take 650-1,300 mg by mouth every 8 (eight)  hours as needed for pain.   VENLAFAXINE XR (EFFEXOR-XR) 150 MG 24 HR CAPSULE    Take 1 capsule (150 mg total) by mouth daily with breakfast.   VITAMIN C (ASCORBIC ACID) 250 MG TABLET    Take 1 tablet (250 mg total) by mouth 2 (two) times daily for 14 days.   ZINC GLUCONATE 50 MG TABLET    Take 1 tablet (50 mg total) by mouth daily for 14 days.  Modified Medications   No medications on file  Discontinued Medications   No medications on file    Physical Exam:  Vitals:   09/27/22 0925  BP: (!) 175/90  Weight: 221 lb (100.2 kg)  Height: 5' 6.5" (1.689 m)   Body mass index is 35.14 kg/m. Wt Readings from Last 3 Encounters:  09/27/22 221 lb (100.2 kg)  09/15/22 221 lb 12.8 oz (100.6 kg)  09/13/22 220 lb (99.8 kg)    Physical Exam Constitutional:      Appearance: Normal appearance.  Pulmonary:     Effort: Pulmonary effort is normal.  Neurological:     Mental Status: He is alert. Mental status is at baseline.  Psychiatric:        Mood and Affect: Mood normal.     Labs reviewed: Basic Metabolic Panel: Recent Labs    12/14/21 1353 12/15/21 0409 12/29/21 0944 06/18/22 1544 06/22/22 0337 09/13/22 1907 09/15/22 1549  NA 138   < > 141   < > 139 139 140  K 3.9   < > 4.9   < > 4.0 3.9 4.0  CL 101   < > 104   < > 101 102 103  CO2 29   < > 24   < > '27 27 21  '$ GLUCOSE 108*   < > 97   < > 89 91 79  BUN 22   < > 25   < > 26* 21 25  CREATININE 1.76*   < > 1.41*   < > 1.60* 1.77* 1.84*  CALCIUM 10.8*   < > 10.3*   < > 10.5* 10.2 9.9  MG  --   --  1.9  --   --   --   --   TSH 2.122  --   --   --   --   --   --    < > = values in this interval not displayed.   Liver Function Tests: Recent Labs    12/14/21 1353 09/13/22 1907  AST 19 28  ALT 17 27  ALKPHOS 93 69  BILITOT 1.1 0.8  PROT 7.9 6.9  ALBUMIN 4.3 3.7   Recent Labs    12/14/21 1353  LIPASE 28   No results for input(s): "AMMONIA" in the last 8760 hours. CBC: Recent Labs    06/18/22 1544 09/13/22 1907  09/15/22 1549  WBC 7.9 7.2 7.1  NEUTROABS  --  5.2 5,957  HGB 16.7 15.4 14.6  HCT 51.0 46.7 43.7  MCV 86.3 88.1 86.2  PLT 200 183 156   Lipid Panel: Recent Labs    12/15/21 0409 06/19/22 1519  CHOL 150 148  HDL 54 59  LDLCALC 62 40  TRIG 170* 247*  CHOLHDL 2.8 2.5   TSH: Recent Labs    12/14/21 1353  TSH 2.122   A1C: Lab Results  Component Value Date   HGBA1C 6.0 (H) 06/19/2022     Assessment/Plan 1. Post-viral cough syndrome -after covid.  Overall improving and reports he is feeling good but cough effecting sleep.  Will have him start mucinex DM by mouth twice daily with full glass of water for 1 week then as needed -continue to maintain hydration. -call if symptoms worsen.    Carlos American. Harle Battiest  [facility] [phone]    Virtual Visit via mychart/video  I connected with patient on 09/27/22 at 10:20 AM EST by mychart/video and verified that I am speaking with the correct person using two identifiers.  Location: Patient: home Provider: [facility]   I discussed the limitations, risks, security and privacy concerns of performing an evaluation and management service by telephone and the availability of in person appointments. I also discussed with the patient that there may be a patient responsible charge related to this service. The patient expressed understanding and agreed to proceed.   I discussed the assessment and treatment plan with the patient. The patient was provided an opportunity to ask questions and all were answered. The patient agreed with the plan and demonstrated an understanding of the instructions.   The patient was advised to call back or seek an in-person evaluation if the symptoms worsen or if the condition fails to improve as anticipated.  I provided 10 minutes of non-face-to-face time during this encounter.  Carlos American. Harle Battiest Avs printed and mailed

## 2022-09-27 NOTE — Telephone Encounter (Signed)
I connected with  Johnny [name] on 09/27/22 by a video enabled telemedicine application and verified that I am speaking with the correct person using two identifiers.   I discussed the limitations of evaluation and management by telemedicine. The patient expressed understanding and agreed to proceed.

## 2022-09-27 NOTE — Progress Notes (Signed)
This service is provided via telemedicine  No vital signs collected/recorded due to the encounter was a telemedicine visit.   Location of patient (ex: home, work):  home  Patient consents to a telephone visit:  yes, see consent dated 09/27/2022  Location of the provider (ex: office, home):  [facility]  Names of all persons participating in the telemedicine service and their role in the encounter:  patient: Johnny [name], [address] Cecil Cranker NP  Time spent on call:  5 minutes

## 2022-10-03 ENCOUNTER — Ambulatory Visit: Payer: Medicare Other | Admitting: Family

## 2022-10-06 ENCOUNTER — Encounter: Payer: Self-pay | Admitting: Neurology

## 2022-10-06 ENCOUNTER — Ambulatory Visit: Payer: Medicare Other | Admitting: Neurology

## 2022-10-06 DIAGNOSIS — R053 Chronic cough: Secondary | ICD-10-CM | POA: Diagnosis not present

## 2022-10-10 ENCOUNTER — Ambulatory Visit (INDEPENDENT_AMBULATORY_CARE_PROVIDER_SITE_OTHER): Payer: Medicare Other | Admitting: Family

## 2022-10-10 ENCOUNTER — Encounter: Payer: Self-pay | Admitting: Family

## 2022-10-10 VITALS — BP 128/70 | HR 62 | Temp 97.5°F | Resp 16 | Ht 66.5 in | Wt 211.8 lb

## 2022-10-10 DIAGNOSIS — I25118 Atherosclerotic heart disease of native coronary artery with other forms of angina pectoris: Secondary | ICD-10-CM

## 2022-10-10 DIAGNOSIS — I739 Peripheral vascular disease, unspecified: Secondary | ICD-10-CM | POA: Diagnosis not present

## 2022-10-10 DIAGNOSIS — I480 Paroxysmal atrial fibrillation: Secondary | ICD-10-CM

## 2022-10-10 DIAGNOSIS — D6869 Other thrombophilia: Secondary | ICD-10-CM

## 2022-10-10 DIAGNOSIS — H6123 Impacted cerumen, bilateral: Secondary | ICD-10-CM

## 2022-10-10 DIAGNOSIS — I5032 Chronic diastolic (congestive) heart failure: Secondary | ICD-10-CM

## 2022-10-10 DIAGNOSIS — E66811 Obesity, class 1: Secondary | ICD-10-CM

## 2022-10-10 DIAGNOSIS — E1142 Type 2 diabetes mellitus with diabetic polyneuropathy: Secondary | ICD-10-CM

## 2022-10-10 DIAGNOSIS — E782 Mixed hyperlipidemia: Secondary | ICD-10-CM | POA: Diagnosis not present

## 2022-10-10 DIAGNOSIS — J9611 Chronic respiratory failure with hypoxia: Secondary | ICD-10-CM

## 2022-10-10 DIAGNOSIS — N1832 Chronic kidney disease, stage 3b: Secondary | ICD-10-CM | POA: Diagnosis not present

## 2022-10-10 DIAGNOSIS — F418 Other specified anxiety disorders: Secondary | ICD-10-CM | POA: Diagnosis not present

## 2022-10-10 DIAGNOSIS — Z6833 Body mass index (BMI) 33.0-33.9, adult: Secondary | ICD-10-CM

## 2022-10-10 DIAGNOSIS — E6609 Other obesity due to excess calories: Secondary | ICD-10-CM

## 2022-10-10 NOTE — Progress Notes (Signed)
Provider: Marlowe Sax FNP-C   Vernona Peake, Nelda Bucks, NP  Patient Care Team: Desirea Mizrahi, Nelda Bucks, NP as PCP - General (Family Medicine) Donato Heinz, MD as PCP - Cardiology (Cardiology)  Extended Emergency Contact Information Primary Emergency Contact: Posceo,Deborah Address: [address] [place] [address] Johnnette Litter of [place] Phone: [phone] Mobile Phone: [phone] Relation: Sister Preferred language: English Secondary Emergency Contact: Upham,Pattie Mobile Phone: [phone] Relation: Other  Code Status:  Full Code  Goals of care: Advanced Directive information    09/15/2022    3:09 PM  Advanced Directives  Does Patient Have a Medical Advance Directive? No  Would patient like information on creating a medical advance directive? No - Patient declined     Chief Complaint  Patient presents with   Follow-up    One Month Follow-up. He states he has a lot of dizziness lately.     HPI:  Pt is a 77 y.o. male seen today for routine visit for medical management of chronic diseases.  He states he has a lot of dizziness lately though attributes to seasonal allergies.Had head congestion from sinuses but now just has runny nose from time to time.Taking OTC allergy medication. He is status post COVID-19 infection seen on 09/15/2022.He was treated with Paxlovid with much improvement but still had cough.Had video visit with Dani Gobble on 09/27/2022 for post COVID-19 cough advised to take mucinex and hydrate. States symptoms have resolved.  Afib - he denies any palpitation on Eliquis.He denies any cough,fatigue,chest tightness,chest pain,palpitation or shortness of breath.states has not seen his Cardiologist in [place].Advised to schedule follow up appointment.   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[place], [place]    Past Surgical History:  Procedure Laterality Date   BACK SURGERY  1975   Lower Back Surgery, [place]   Dr.Seltzer   SHOULDER SURGERY Left 1965   Dr.Seltzer   TONSILLECTOMY  1950   Dr.Perellie    Allergies  Allergen Reactions   Acacia Itching   Metformin And Related Other (See Comments)    Metformin caused the patient's legs to become weak    Allergies as of 10/10/2022       Reactions   Acacia Itching   Metformin And Related Other (See Comments)   Metformin caused the patient's legs to become weak        Medication List        Accurate as of October 10, 2022  2:44 PM. If you have any questions, ask your nurse or doctor.          albuterol 108 (90 Base) MCG/ACT inhaler Commonly known as: VENTOLIN HFA Inhale 1 puff into the lungs every 6 (six) hours as needed for wheezing or shortness of breath.   apixaban 5 MG Tabs tablet Commonly known as: ELIQUIS Take 2.5 mg tablet one by mouth twice daily x 5 days then resume 5 mg tablet one by mouth twice daily when done with Paxlovid.   carvedilol 6.25 MG tablet Commonly known as: COREG Take 1 tablet (6.25 mg total) by mouth 2 (two) times daily.   famotidine 40 MG tablet Commonly known as: PEPCID Take 40 mg by mouth daily.   fluticasone 50 MCG/ACT nasal spray Commonly known as: FLONASE Place 2 sprays  into both nostrils 3 (three) times daily as needed for allergies or rhinitis.   furosemide 40 MG tablet Commonly known as: LASIX Take 1 tablet (40 mg total) by mouth every other day.   isosorbide mononitrate 30 MG 24 hr tablet Commonly known as: IMDUR Take 1 tablet (30 mg total) by mouth daily.   lisinopril 2.5 MG tablet Commonly known as: ZESTRIL Take 2.5 mg by mouth daily.   loratadine 10 MG tablet Commonly known as: CLARITIN Take 1 tablet (10 mg total) by mouth daily.   montelukast 10 MG tablet Commonly known as: SINGULAIR Take 10 mg by mouth daily as needed (for allergies).    nitroGLYCERIN 0.4 MG SL tablet Commonly known as: NITROSTAT Place 0.4 mg under the tongue every 5 (five) minutes as needed for chest pain.   pantoprazole 40 MG tablet Commonly known as: PROTONIX Take 1 tablet (40 mg total) by mouth daily before breakfast.   rosuvastatin 40 MG tablet Commonly known as: CRESTOR Take 1 tablet (40 mg total) by mouth daily.   sodium chloride 0.65 % Soln nasal spray Commonly known as: OCEAN Place 1 spray into both nostrils as needed for congestion.   Tylenol 8 Hour Arthritis Pain 650 MG CR tablet Generic drug: acetaminophen Take 650-1,300 mg by mouth every 8 (eight) hours as needed for pain.   venlafaxine XR 150 MG 24 hr capsule Commonly known as: EFFEXOR-XR Take 1 capsule (150 mg total) by mouth daily with breakfast.        Review of Systems  Constitutional:  Negative for appetite change, chills, fatigue, fever and unexpected weight change.  HENT:  Negative for congestion, dental problem, ear discharge, ear pain, facial swelling, hearing loss, nosebleeds, postnasal drip, rhinorrhea, sinus pressure, sinus pain, sneezing, sore throat, tinnitus and trouble swallowing.   Eyes:  Negative for pain, discharge, redness, itching and visual disturbance.  Respiratory:  Negative for cough, chest tightness, shortness of breath and wheezing.   Cardiovascular:  Negative for chest pain, palpitations and leg swelling.       Pace maker   Gastrointestinal:  Negative for abdominal distention, abdominal pain, blood in stool, constipation, diarrhea, nausea and vomiting.  Endocrine: Negative for cold intolerance, heat intolerance, polydipsia, polyphagia and polyuria.  Genitourinary:  Negative for difficulty urinating, dysuria, flank pain, frequency and urgency.  Musculoskeletal:  Negative for arthralgias, back pain, gait problem, joint swelling, myalgias, neck pain and neck stiffness.  Skin:  Negative for color change, pallor, rash and wound.  Neurological:  Negative  for dizziness, syncope, speech difficulty, weakness, light-headedness, numbness and headaches.  Hematological:  Does not bruise/bleed easily.  Psychiatric/Behavioral:  Negative for agitation, behavioral problems, confusion, hallucinations and sleep disturbance. The patient is not nervous/anxious.     Immunization History  Administered Date(s) Administered   Fluad Quad(high Dose 65+) 05/06/2020, 06/22/2022   Influenza Split 09/15/2009, 05/03/2013   Influenza, High Dose Seasonal PF 06/05/2014, 04/20/2015, 06/08/2016, 04/22/2017, 04/15/2021   Influenza-Unspecified 07/14/2004, 05/07/2007, 05/02/2008, 05/01/2018, 04/29/2019   PPD Test 06/08/2021   Pneumococcal Conjugate-13 06/05/2014   Pneumococcal Polysaccharide-23 08/13/2012   Pertinent  Health Maintenance Due  Topic Date Due   FOOT EXAM  Never done   OPHTHALMOLOGY EXAM  Never done   HEMOGLOBIN A1C  12/18/2022   INFLUENZA VACCINE  Completed      06/21/2022    9:00 AM 06/21/2022    8:00 PM 08/03/2022    1:03 PM 08/22/2022    1:42 PM 09/15/2022    3:09 PM  Fall Risk  Falls in the past year?   0 0 1  Was there an injury with Fall?   0 0 1  Fall Risk Category Calculator   0 0 2  Fall Risk Category (Retired)   Low    (RETIRED) Patient Fall Risk Level High fall risk High fall risk Low fall risk    Patient at Risk for Falls Due to   No Fall Risks No Fall Risks History of fall(s);Impaired balance/gait  Fall risk Follow up   Falls evaluation completed Falls evaluation completed Falls evaluation completed;Education provided;Falls prevention discussed   Functional Status Survey:    Vitals:   10/10/22 1428  BP: 128/70  Pulse: 62  Resp: 16  Temp: (!) 97.5 F (36.4 C)  TempSrc: Temporal  SpO2: 94%  Weight: 211 lb 12.8 oz (96.1 kg)  Height: 5' 6.5" (1.689 m)   Body mass index is 33.67 kg/m. Physical Exam Vitals reviewed.  Constitutional:      General: He is not in acute distress.    Appearance: Normal appearance. He is obese. He  is not ill-appearing or diaphoretic.  HENT:     Head: Normocephalic.     Right Ear: There is impacted cerumen.     Left Ear: There is impacted cerumen.     Ears:     Comments: Moderate amounts of cerumen removed using curette on both ears.Partial impaction noted.TM clear.     Nose: Nose normal. No congestion or rhinorrhea.     Mouth/Throat:     Mouth: Mucous membranes are moist.     Pharynx: Oropharynx is clear. No oropharyngeal exudate or posterior oropharyngeal erythema.  Eyes:     General: No scleral icterus.       Right eye: No discharge.        Left eye: No discharge.     Extraocular Movements: Extraocular movements intact.     Conjunctiva/sclera: Conjunctivae normal.     Pupils: Pupils are equal, round, and reactive to light.  Neck:     Vascular: No carotid bruit.  Cardiovascular:     Rate and Rhythm: Normal rate and regular rhythm.     Pulses: Normal pulses.     Heart sounds: Murmur heard.     No friction rub. No gallop.  Pulmonary:     Effort: Pulmonary effort is normal. No respiratory distress.     Breath sounds: Normal breath sounds. No wheezing, rhonchi or rales.  Chest:     Chest wall: No tenderness.  Abdominal:     General: Bowel sounds are normal. There is no distension.     Palpations: Abdomen is soft. There is no mass.     Tenderness: There is no abdominal tenderness. There is no right CVA tenderness, left CVA tenderness, guarding or rebound.  Musculoskeletal:        General: No swelling or tenderness. Normal range of motion.     Cervical back: Normal range of motion. No rigidity or tenderness.     Right lower leg: No edema.     Left lower leg: No edema.  Lymphadenopathy:     Cervical: No cervical adenopathy.  Skin:    General: Skin is warm and dry.     Coloration: Skin is not pale.     Findings: No bruising, erythema, lesion or rash.  Neurological:     Mental Status: He is alert and oriented to person, place, and time.     Cranial Nerves: No cranial  nerve deficit.     Sensory:  No sensory deficit.     Motor: No weakness.     Coordination: Coordination normal.     Gait: Gait normal.  Psychiatric:        Mood and Affect: Mood normal.        Speech: Speech normal.        Behavior: Behavior normal.        Thought Content: Thought content normal.        Judgment: Judgment normal.     Labs reviewed: Recent Labs    12/29/21 0944 06/18/22 1544 06/22/22 0337 09/13/22 1907 09/15/22 1549  NA 141   < > 139 139 140  K 4.9   < > 4.0 3.9 4.0  CL 104   < > 101 102 103  CO2 24   < > '27 27 21  '$ GLUCOSE 97   < > 89 91 79  BUN 25   < > 26* 21 25  CREATININE 1.41*   < > 1.60* 1.77* 1.84*  CALCIUM 10.3*   < > 10.5* 10.2 9.9  MG 1.9  --   --   --   --    < > = values in this interval not displayed.   Recent Labs    12/14/21 1353 09/13/22 1907  AST 19 28  ALT 17 27  ALKPHOS 93 69  BILITOT 1.1 0.8  PROT 7.9 6.9  ALBUMIN 4.3 3.7   Recent Labs    06/18/22 1544 09/13/22 1907 09/15/22 1549  WBC 7.9 7.2 7.1  NEUTROABS  --  5.2 5,957  HGB 16.7 15.4 14.6  HCT 51.0 46.7 43.7  MCV 86.3 88.1 86.2  PLT 200 183 156   Lab Results  Component Value Date   TSH 2.122 12/14/2021   Lab Results  Component Value Date   HGBA1C 6.0 (H) 06/19/2022   Lab Results  Component Value Date   CHOL 148 06/19/2022   HDL 59 06/19/2022   LDLCALC 40 06/19/2022   TRIG 247 (H) 06/19/2022   CHOLHDL 2.5 06/19/2022    Significant Diagnostic Results in last 30 days:  CT Head Wo Contrast  Result Date: 09/13/2022 CLINICAL DATA:  Status post recent fall. EXAM: CT HEAD WITHOUT CONTRAST TECHNIQUE: Contiguous axial images were obtained from the base of the skull through the vertex without intravenous contrast. RADIATION DOSE REDUCTION: This exam was performed according to the departmental dose-optimization program which includes automated exposure control, adjustment of the mA and/or kV according to patient size and/or use of iterative reconstruction technique.  COMPARISON:  None Available. FINDINGS: Brain: There is mild cerebral atrophy with widening of the extra-axial spaces and ventricular dilatation. There are areas of decreased attenuation within the white matter tracts of the supratentorial brain, consistent with microvascular disease changes. Vascular: There is moderate severity calcification of the bilateral cavernous carotid arteries. Skull: Normal. Negative for fracture or focal lesion. Sinuses/Orbits: No acute finding. Other: There is marked severity right-sided facial, right periorbital, right preseptal and right frontal scalp soft tissue swelling. A 1.8 cm x 0.9 cm x 1.6 cm right frontal scalp hematoma is also noted. Mild to moderate severity left-sided facial and bilateral paranasal soft tissue swelling is also seen. IMPRESSION: 1. Marked severity right-sided facial, right periorbital, right preseptal and right frontal scalp soft tissue swelling with a 1.8 cm x 0.9 cm x 1.6 cm right frontal scalp hematoma. 2. Mild to moderate severity left-sided facial and bilateral paranasal soft tissue swelling. 3. No acute intracranial abnormality. 4. Generalized cerebral atrophy with widening  of the extra-axial spaces and ventricular dilatation. Electronically Signed   By: Virgina Norfolk M.D.   On: 09/13/2022 20:39   DG Chest Portable 1 View  Result Date: 09/13/2022 CLINICAL DATA:  Chest pain and shortness of breath for 1 day EXAM: PORTABLE CHEST 1 VIEW COMPARISON:  06/18/2022 and older FINDINGS: Hyperinflation. Sternal wires. Uppermost sternal wire is fractured. Left chest pacemaker with leads along the right side of the heart. Left shoulder arthroplasty. Screw along the coracoid. Overlapping cardiac leads. Lumbar fusion hardware seen at the very edge of the imaging field. No consolidation, pneumothorax or effusion. No edema. Normal cardiopericardial silhouette. Tortuous and ectatic aorta. IMPRESSION: Postop chest.  Pacemaker. Hyperinflation. Tortuous and ectatic  aorta. Electronically Signed   By: Jill Side M.D.   On: 09/13/2022 19:57    Assessment/Plan 1. DM type 2 with diabetic peripheral neuropathy (HCC) Lab Results  Component Value Date   HGBA1C 6.0 (H) 06/19/2022   No home CBG for review  -Continue on dietary modification and exercise -Continue on rosuvastatin for cardiovascular event prevention. Also on Eliquis anticoagulation - TSH - Hemoglobin A1c  2. Chronic diastolic HF (heart failure) (HCC) No symptoms of fluid overload -Continue on carvedilol, Imdur, lisinopril and furosemide - COMPLETE METABOLIC PANEL WITH GFR  3. PAD (peripheral artery disease) (HCC) No ulceration. Continue to control high risk factors.  4. Anxiety with depression Mood stable Continue on Effexor XR  5. Mixed hyperlipidemia LDL on chart at goal but triglycerides elevated.  Will recheck lab work. Dietary modification and exercise advised -Continue on rosuvastatin - Lipid panel  6. Acquired thrombophilia ([place]) On Eliquis for anticoagulation due to A-fib  7. Atherosclerotic heart disease of native coronary artery with other forms of angina pectoris (HCC) Chest pain free -Continue on nitroglycerin as needed -Continue on imdur Continue on rosuvastatin and control high risk factors -Advised to schedule appointment with cardiologist for follow-up visits.  8. Chronic respiratory failure with hypoxia (HCC) Breathing stable Reports has CPAP but not wearing it at night  9. Paroxysmal atrial fibrillation (HCC) Heart rate control -Continue on Coreg for rate control -Continue on Eliquis for anticoagulation.  No bleeding symptoms reported - CBC with Differential/Platelet - TSH  10. Stage 3b chronic kidney disease ([place]) creatinine on chart 1.6 ( 04/2022) -Advised to continue to avoid NSAIDs and  dose of the medication for renal clearance - COMPLETE METABOLIC PANEL WITH GFR  11. Body mass index (BMI) of 33.0-33.9 in adult BMI 33.67  12. 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 Bilateral impacted cerumen Bilateral TM partial cerumen impaction.Cerumen removed using curette. Tolerated procedure well TM clear without any signs of infection.  Family/ staff Communication: Reviewed plan of care with patient verbalized understanding   Labs/tests ordered:  - CBC with Differential/Platelet - CMP with eGFR([facility]) - TSH - Hgb A1C - Lipid panel  Next Appointment : Return in about 6 months (around 04/12/2023) for medical mangement of chronic issues.Sandrea Hughs, NP

## 2022-10-11 LAB — TSH: TSH: 1.27 mIU/L (ref 0.40–4.50)

## 2022-10-11 LAB — COMPLETE METABOLIC PANEL WITH GFR
AG Ratio: 1.5 (calc) (ref 1.0–2.5)
ALT: 25 U/L (ref 9–46)
AST: 24 U/L (ref 10–35)
Albumin: 4.4 g/dL (ref 3.6–5.1)
Alkaline phosphatase (APISO): 86 U/L (ref 35–144)
BUN/Creatinine Ratio: 13 (calc) (ref 6–22)
BUN: 25 mg/dL (ref 7–25)
CO2: 28 mmol/L (ref 20–32)
Calcium: 10.9 mg/dL — ABNORMAL HIGH (ref 8.6–10.3)
Chloride: 102 mmol/L (ref 98–110)
Creat: 1.9 mg/dL — ABNORMAL HIGH (ref 0.70–1.28)
Globulin: 2.9 g/dL (calc) (ref 1.9–3.7)
Glucose, Bld: 77 mg/dL (ref 65–139)
Potassium: 4.1 mmol/L (ref 3.5–5.3)
Sodium: 141 mmol/L (ref 135–146)
Total Bilirubin: 0.5 mg/dL (ref 0.2–1.2)
Total Protein: 7.3 g/dL (ref 6.1–8.1)
eGFR: 36 mL/min/{1.73_m2} — ABNORMAL LOW (ref >=60)

## 2022-10-11 LAB — LIPID PANEL
Cholesterol: 158 mg/dL (ref <200)
HDL: 70 mg/dL (ref >=40)
LDL Cholesterol (Calc): 64 mg/dL (calc)
Non-HDL Cholesterol (Calc): 88 mg/dL (calc) (ref <130)
Total CHOL/HDL Ratio: 2.3 (calc) (ref <5.0)
Triglycerides: 154 mg/dL — ABNORMAL HIGH (ref <150)

## 2022-10-11 LAB — HEMOGLOBIN A1C
Hgb A1c MFr Bld: 6 % of total Hgb — ABNORMAL HIGH (ref <5.7)
Mean Plasma Glucose: 126 mg/dL
eAG (mmol/L): 7 mmol/L

## 2022-10-11 LAB — CBC WITH DIFFERENTIAL/PLATELET
Absolute Monocytes: 979 cells/uL — ABNORMAL HIGH (ref 200–950)
Basophils Absolute: 29 cells/uL (ref 0–200)
Basophils Relative: 0.3 %
Eosinophils Absolute: 247 cells/uL (ref 15–500)
Eosinophils Relative: 2.6 %
HCT: 49.5 % (ref 38.5–50.0)
Hemoglobin: 16.1 g/dL (ref 13.2–17.1)
Lymphs Abs: 1568 cells/uL (ref 850–3900)
MCH: 28.1 pg (ref 27.0–33.0)
MCHC: 32.5 g/dL (ref 32.0–36.0)
MCV: 86.4 fL (ref 80.0–100.0)
MPV: 11 fL (ref 7.5–12.5)
Monocytes Relative: 10.3 %
Neutro Abs: 6679 cells/uL (ref 1500–7800)
Neutrophils Relative %: 70.3 %
Platelets: 200 10*3/uL (ref 140–400)
RBC: 5.73 10*6/uL (ref 4.20–5.80)
RDW: 16.7 % — ABNORMAL HIGH (ref 11.0–15.0)
Total Lymphocyte: 16.5 %
WBC: 9.5 10*3/uL (ref 3.8–10.8)

## 2022-10-18 ENCOUNTER — Ambulatory Visit: Payer: Medicare Other | Admitting: Family

## 2022-10-18 DIAGNOSIS — U071 COVID-19: Secondary | ICD-10-CM | POA: Diagnosis not present

## 2022-10-19 ENCOUNTER — Other Ambulatory Visit: Payer: Self-pay

## 2022-10-19 DIAGNOSIS — I5032 Chronic diastolic (congestive) heart failure: Secondary | ICD-10-CM

## 2022-10-21 DIAGNOSIS — U071 COVID-19: Secondary | ICD-10-CM | POA: Diagnosis not present

## 2022-11-02 ENCOUNTER — Encounter: Payer: Self-pay | Admitting: Neurology

## 2022-11-02 ENCOUNTER — Ambulatory Visit (INDEPENDENT_AMBULATORY_CARE_PROVIDER_SITE_OTHER): Payer: Medicare Other | Admitting: Neurology

## 2022-11-02 VITALS — BP 108/71 | HR 60 | Ht 67.0 in | Wt 210.0 lb

## 2022-11-02 DIAGNOSIS — G3184 Mild cognitive impairment, so stated: Secondary | ICD-10-CM | POA: Diagnosis not present

## 2022-11-02 NOTE — Patient Instructions (Addendum)
Dementia labs including TSH, B12 and ATN profile Referral for formal neuropsychological testing Routine EEG  Continue current medications Encouraged increased physical activity Follow-up in 1 year or sooner if worse.    There are well-accepted and sensible ways to reduce risk for Alzheimers disease and other degenerative brain disorders .  Exercise Daily Walk A daily 20 minute walk should be part of your routine. Disease related apathy can be a significant roadblock to exercise and the only way to overcome this is to make it a daily routine and perhaps have a reward at the end (something your loved one loves to eat or drink perhaps) or a personal trainer coming to the home can also be very useful. Most importantly, the patient is much more likely to exercise if the caregiver / spouse does it with him/her. In general a structured, repetitive schedule is best.  General Health: Any diseases which effect your body will effect your brain such as a pneumonia, urinary infection, blood clot, heart attack or stroke. Keep contact with your primary care doctor for regular follow ups.  Sleep. A good nights sleep is healthy for the brain. Seven hours is recommended. If you have insomnia or poor sleep habits we can give you some instructions. If you have sleep apnea wear your mask.  Diet: Eating a heart healthy diet is also a good idea; fish and poultry instead of red meat, nuts (mostly non-peanuts), vegetables, fruits, olive oil or canola oil (instead of butter), minimal salt (use other spices to flavor foods), whole grain rice, bread, cereal and pasta and wine in moderation.Research is now showing that the MIND diet, which is a combination of The Mediterranean diet and the DASH diet, is beneficial for cognitive processing and longevity. Information about this diet can be found in The MIND Diet, a book by Doyne Keel, MS, RDN, and online at [employer].si  Finances, Power of  Attorney and Advance Directives: You should consider putting legal safeguards in place with regard to financial and medical decision making. While the spouse always has power of attorney for medical and financial issues in the absence of any form, you should consider what you want in case the spouse / caregiver is no longer around or capable of making decisions.

## 2022-11-02 NOTE — Progress Notes (Signed)
[facility]  PATIENT: Johnny [name] DOB: November 07, 1945  REQUESTING CLINICIAN: Ngetich, Dinah C, NP HISTORY FROM: Patient  REASON FOR VISIT: Memory deficits    HISTORICAL  CHIEF COMPLAINT:  Chief Complaint  Patient presents with   New Patient (Initial Visit)    Rm 14,  Alone here for memory testing  [place] [address]    HISTORY OF PRESENT ILLNESS:  This is a 77 year old gentleman past medical history including hypertension, hyperlipidemia, heart disease, atrial fibrillation on Eliquis, obesity, sleep apnea not on CPAP, anxiety/depression who is presenting for memory problem.  Patient reports memory problem has been going on for many years and getting worse.  He is very forgetful, misplacing items.  He does forget what people tells him.  He does not remember.  He reports a history of ADD and dyslexia which make his memory concerns worse.  He is alone here today.  He denies any family history of dementia.  He does drive, denies any recent accident but stated he can get loss going to familiar places.   TBI:   No past history of TBI Stroke:   no past history of stroke Seizures:   no past history of seizures Sleep: Yes, does not uses CPAP  Mood: Yes, on Venlafaxine  Family history of Dementia: Denies  Functional status: independent in all ADLs and IADLs Patient lives with [facility]er . Cooking: sometimes Cleaning: yes Shopping: yes  Bathing: yes, no issues  Toileting: yes, no issues  Driving: Yes, sometimes get lost  Bills: late because of forgetfulness  Medications: Sometimes will forget to take it  Ever left the stove on by accident?: Yes  Forget how to use items around the house?: Remote control  Getting lost going to familiar places?: Yes  Forgetting loved ones names?: Denies  Word finding difficulty? Yes  Sleep: Good    OTHER MEDICAL CONDITIONS: Hypertension, Hyperlipidemia, Obesity, sleep apnea not on CPAP, heart disease, atrial fibrillation    REVIEW OF  SYSTEMS: Full 14 system review of systems performed and negative with exception of: As noted in the HPI   ALLERGIES: Allergies  Allergen Reactions   Acacia Itching   Metformin And Related Other (See Comments)    Metformin caused the patient's legs to become weak    HOME MEDICATIONS: Outpatient Medications Prior to Visit  Medication Sig Dispense Refill   albuterol (VENTOLIN HFA) 108 (90 Base) MCG/ACT inhaler Inhale 1 puff into the lungs every 6 (six) hours as needed for wheezing or shortness of breath.     apixaban (ELIQUIS) 5 MG TABS tablet Take 2.5 mg tablet one by mouth twice daily x 5 days then resume 5 mg tablet one by mouth twice daily when done with Paxlovid. 180 tablet 3   carvedilol (COREG) 6.25 MG tablet Take 1 tablet (6.25 mg total) by mouth 2 (two) times daily. 180 tablet 1   famotidine (PEPCID) 40 MG tablet Take 40 mg by mouth daily.     fluticasone (FLONASE) 50 MCG/ACT nasal spray Place 2 sprays into both nostrils 3 (three) times daily as needed for allergies or rhinitis.     furosemide (LASIX) 40 MG tablet Take 1 tablet (40 mg total) by mouth every other day. 45 tablet 1   isosorbide mononitrate (IMDUR) 30 MG 24 hr tablet Take 1 tablet (30 mg total) by mouth daily. 90 tablet 1   lisinopril (ZESTRIL) 2.5 MG tablet Take 2.5 mg by mouth daily.     loratadine (CLARITIN) 10 MG tablet Take 1 tablet (  10 mg total) by mouth daily. 30 tablet 11   montelukast (SINGULAIR) 10 MG tablet Take 10 mg by mouth daily as needed (for allergies).     nitroGLYCERIN (NITROSTAT) 0.4 MG SL tablet Place 0.4 mg under the tongue every 5 (five) minutes as needed for chest pain.     pantoprazole (PROTONIX) 40 MG tablet Take 1 tablet (40 mg total) by mouth daily before breakfast. 100 tablet 2   rosuvastatin (CRESTOR) 40 MG tablet Take 1 tablet (40 mg total) by mouth daily. 90 tablet 1   sodium chloride (OCEAN) 0.65 % SOLN nasal spray Place 1 spray into both nostrils as needed for congestion. 88 mL 3    TYLENOL 8 HOUR ARTHRITIS PAIN 650 MG CR tablet Take 650-1,300 mg by mouth every 8 (eight) hours as needed for pain.     venlafaxine XR (EFFEXOR-XR) 150 MG 24 hr capsule Take 1 capsule (150 mg total) by mouth daily with breakfast. 90 capsule 1   No facility-administered medications prior to visit.    PAST MEDICAL HISTORY: Past Medical History:  Diagnosis Date   Atrial fibrillation    Deafness in left ear    Hypertension    Symptomatic bradycardia 04/2021   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[place], [place]    PAST SURGICAL HISTORY: Past Surgical History:  Procedure Laterality Date   BACK SURGERY  1975   Lower Back Surgery, [place]   Dr.Seltzer   SHOULDER SURGERY Left 1965   Dr.Seltzer   TONSILLECTOMY  1950   Dr.Perellie    FAMILY HISTORY: Family History  Problem Relation Age of Onset   Cancer Father    Stroke Sister    Stroke Brother     SOCIAL HISTORY: Social History   Socioeconomic History   Marital status: Widowed    Spouse name: Not on file   Number of children: Not on file   Years of education: Not on file   Highest education level: Not on file  Occupational History   Not on file  Tobacco Use   Smoking status: Former    Packs/day: 1.00    Years: 10.00    Additional pack years: 0.00    Total pack years: 10.00    Types: Cigarettes   Smokeless tobacco: Never  Substance and Sexual Activity   Alcohol use: Never   Drug use: Never   Sexual activity: Not on file  Other Topics Concern   Not on file  Social History Narrative   Tobacco use, amount per day now: None   Past tobacco use, amount per day: 1 pack   How many years did you use tobacco: 10 years.   Alcohol use (drinks per week): None   Diet:   Do you drink/eat things with caffeine: Yes   Marital status:  Widow                                What year were you married? 1972   Do you live in a house, apartment, assisted living, condo, trailer, etc.? House   Is it one or more stories? One   How  many persons live in your home? One   Do you have pets in your home?( please list) No   Highest Level of education completed? 12th grade.   Current or past profession: Building control surveyor, Biomedical scientist, Therapist, art.    Do you exercise?   No  Type and how often?   Do you have a living will? Yes   Do you have a DNR form?     No                              If not, do you want to discuss one?   Do you have signed POA/HPOA forms?  Yes                      If so, please bring to you appointment      Do you have any difficulty bathing or dressing yourself? No   Do you have any difficulty preparing food or eating? No   Do you have any difficulty managing your medications? No   Do you have any difficulty managing your finances? Yes   Do you have any difficulty affording your medications? No   Social Determinants of Health   Financial Resource Strain: Not on file  Food Insecurity: No Food Insecurity (06/19/2022)   Hunger Vital Sign    Worried About Running Out of Food in the Last Year: Never true    Ran Out of Food in the Last Year: Never true  Transportation Needs: No Transportation Needs (06/19/2022)   PRAPARE - Hydrologist (Medical): No    Lack of Transportation (Non-Medical): No  Physical Activity: Not on file  Stress: Not on file  Social Connections: Not on file  Intimate Partner Violence: Not At Risk (06/19/2022)   Humiliation, Afraid, Rape, and Kick questionnaire    Fear of Current or Ex-Partner: No    Emotionally Abused: No    Physically Abused: No    Sexually Abused: No    PHYSICAL EXAM  GENERAL EXAM/CONSTITUTIONAL: Vitals:  Vitals:   11/02/22 0957  BP: 108/71  Pulse: 60  Weight: 210 lb (95.3 kg)  Height: 5\' 7"  (1.702 m)   Body mass index is 32.89 kg/m. Wt Readings from Last 3 Encounters:  11/02/22 210 lb (95.3 kg)  10/10/22 211 lb 12.8 oz (96.1 kg)  09/27/22 221 lb (100.2 kg)   Patient is in no distress; well developed,  nourished and groomed; neck is supple   EYES: Visual fields full to confrontation, Extraocular movements intacts,   MUSCULOSKELETAL: Gait, strength, tone, movements noted in Neurologic exam below  NEUROLOGIC: MENTAL STATUS:      No data to display            11/02/2022   10:02 AM  Montreal Cognitive Assessment   Visuospatial/ Executive (0/5) 3  Naming (0/3) 3  Attention: Read list of digits (0/2) 2  Attention: Read list of letters (0/1) 1  Attention: Serial 7 subtraction starting at 100 (0/3) 3  Language: Repeat phrase (0/2) 0  Language : Fluency (0/1) 1  Abstraction (0/2) 2  Delayed Recall (0/5) 0  Orientation (0/6) 6  Total 21    awake, alert, oriented to person, place and time recent and remote memory intact normal attention and concentration language fluent, comprehension intact, naming intact fund of knowledge appropriate  CRANIAL NERVE:  2nd, 3rd, 4th, 6th- visual fields full to confrontation, extraocular muscles intact, no nystagmus 5th - facial sensation symmetric 7th - facial strength symmetric 8th - hearing intact 9th - palate elevates symmetrically, uvula midline 11th - shoulder shrug symmetric 12th - tongue protrusion midline  MOTOR:  normal bulk and tone, full strength in the BUE, BLE  SENSORY:  normal and symmetric to light touch  COORDINATION:  finger-nose-finger, fine finger movements normal  GAIT/STATION:  Antalgic giat    DIAGNOSTIC DATA (LABS, IMAGING, TESTING) - I reviewed patient records, labs, notes, testing and imaging myself where available.  Lab Results  Component Value Date   WBC 9.5 10/10/2022   HGB 16.1 10/10/2022   HCT 49.5 10/10/2022   MCV 86.4 10/10/2022   PLT 200 10/10/2022      Component Value Date/Time   NA 141 10/10/2022 1510   NA 141 12/29/2021 0944   K 4.1 10/10/2022 1510   CL 102 10/10/2022 1510   CO2 28 10/10/2022 1510   GLUCOSE 77 10/10/2022 1510   BUN 25 10/10/2022 1510   BUN 25 12/29/2021 0944    CREATININE 1.90 (H) 10/10/2022 1510   CALCIUM 10.9 (H) 10/10/2022 1510   PROT 7.3 10/10/2022 1510   ALBUMIN 3.7 09/13/2022 1907   AST 24 10/10/2022 1510   ALT 25 10/10/2022 1510   ALKPHOS 69 09/13/2022 1907   BILITOT 0.5 10/10/2022 1510   GFRNONAA 39 (L) 09/13/2022 1907   Lab Results  Component Value Date   CHOL 158 10/10/2022   HDL 70 10/10/2022   LDLCALC 64 10/10/2022   TRIG 154 (H) 10/10/2022   CHOLHDL 2.3 10/10/2022   Lab Results  Component Value Date   HGBA1C 6.0 (H) 10/10/2022   Lab Results  Component Value Date   VITAMINB12 204 12/14/2021   Lab Results  Component Value Date   TSH 1.27 10/10/2022    Head CT 09/13/2022 1. No acute intracranial abnormality. 2. Generalized cerebral atrophy with widening of the extra-axial spaces and ventricular dilatation.    ASSESSMENT AND PLAN  77 y.o. year old male with hypertension, hyperlipidemia, heart disease, atrial fibrillation on Eliquis, obesity, sleep apnea on CPAP, anxiety/depression who is presenting with memory problem described as being forgetful, misplacing items and needing reminders.  Patient still lives independently, intact activity of daily living.  On exam today he scored 21 on the MoCA indicative of impairment.  Patient does likely have mild cognitive impairment.  Will obtain dementia labs including B12, TSH and ATN profile to look for Alzheimer disease biomarker.  I will also refer the patient for formal neuropsychological testing.  He reported in the past having EEG and was told it was abnormal, will repeat EEG.  For now continue current medications, I encourage patient to increase physical activity at least 20-minutes of walking 5 days a week and I will see him in 1 year for follow-up.   1. Mild cognitive impairment      Patient Instructions  Dementia labs including TSH, B12 and ATN profile Referral for formal neuropsychological testing Routine EEG  Continue current medications Encouraged increased  physical activity Follow-up in 1 year or sooner if worse.    There are well-accepted and sensible ways to reduce risk for Alzheimers disease and other degenerative brain disorders .  Exercise Daily Walk A daily 20 minute walk should be part of your routine. Disease related apathy can be a significant roadblock to exercise and the only way to overcome this is to make it a daily routine and perhaps have a reward at the end (something your loved one loves to eat or drink perhaps) or a personal trainer coming to the home can also be very useful. Most importantly, the patient is much more likely to exercise if the caregiver / spouse does it with him/her. In general a structured, repetitive schedule is best.  General  Health: Any diseases which effect your body will effect your brain such as a pneumonia, urinary infection, blood clot, heart attack or stroke. Keep contact with your primary care doctor for regular follow ups.  Sleep. A good nights sleep is healthy for the brain. Seven hours is recommended. If you have insomnia or poor sleep habits we can give you some instructions. If you have sleep apnea wear your mask.  Diet: Eating a heart healthy diet is also a good idea; fish and poultry instead of red meat, nuts (mostly non-peanuts), vegetables, fruits, olive oil or canola oil (instead of butter), minimal salt (use other spices to flavor foods), whole grain rice, bread, cereal and pasta and wine in moderation.Research is now showing that the MIND diet, which is a combination of The Mediterranean diet and the DASH diet, is beneficial for cognitive processing and longevity. Information about this diet can be found in The MIND Diet, a book by Doyne Keel, MS, RDN, and online at [employer]book[employer].si  Finances, Power of Attorney and Advance Directives: You should consider putting legal safeguards in place with regard to financial and medical decision making. While the spouse always  has power of attorney for medical and financial issues in the absence of any form, you should consider what you want in case the spouse / caregiver is no longer around or capable of making decisions.   Orders Placed This Encounter  Procedures   TSH   Vitamin B12   ATN PROFILE   EEG adult    No orders of the defined types were placed in this encounter.   Return in about 1 year (around 11/02/2023).    Alric Ran, MD 11/02/2022, 10:38 AM  [facility] [address], [place], [place] [address] [phone]

## 2022-11-05 LAB — ATN PROFILE
A -- Beta-amyloid 42/40 Ratio: 0.103 (ref >0.102)
Beta-amyloid 40: 301.67 pg/mL
Beta-amyloid 42: 31.13 pg/mL
N -- NfL, Plasma: 3.66 pg/mL (ref 0.00–7.64)
T -- p-tau181: 2.07 pg/mL — ABNORMAL HIGH (ref 0.00–0.97)

## 2022-11-05 LAB — TSH: TSH: 1.9 u[IU]/mL (ref 0.450–4.500)

## 2022-11-05 LAB — VITAMIN B12: Vitamin B-12: 290 pg/mL (ref 232–1245)

## 2022-11-05 MED ORDER — VITAMIN B-12 1000 MCG PO TABS: 1000.0000 ug | ORAL_TABLET | Freq: Every day | ORAL | 1 refills | Status: AC

## 2022-11-05 NOTE — Addendum Note (Signed)
Addended byWindell Norfolk on: 11/05/2022 02:04 PM   Modules accepted: Orders

## 2022-11-06 ENCOUNTER — Other Ambulatory Visit: Payer: Self-pay | Admitting: Family

## 2022-11-06 DIAGNOSIS — I5032 Chronic diastolic (congestive) heart failure: Secondary | ICD-10-CM

## 2022-11-06 DIAGNOSIS — R053 Chronic cough: Secondary | ICD-10-CM | POA: Diagnosis not present

## 2022-11-07 ENCOUNTER — Other Ambulatory Visit: Payer: Self-pay

## 2022-11-07 MED ORDER — NITROGLYCERIN 0.4 MG SL SUBL: 0.4000 mg | SUBLINGUAL_TABLET | SUBLINGUAL | 0 refills | Status: DC | PRN

## 2022-11-07 NOTE — Telephone Encounter (Signed)
Patient request refill on nitroglycerin be [facility]

## 2022-11-08 ENCOUNTER — Telehealth: Payer: Self-pay | Admitting: Cardiology

## 2022-11-08 NOTE — Telephone Encounter (Signed)
Pt c/o Shortness Of Breath: STAT if SOB developed within the last 24 hours or pt is noticeably SOB on the phone  1. Are you currently SOB (can you hear that pt is SOB on the phone)? Yes   2. How long have you been experiencing SOB? For a bit now, but it is worse now.   3. Are you SOB when sitting or when up moving around? Both   4. Are you currently experiencing any other symptoms? Chest pain yesterday.  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 Also states that he can not walk across room without being completely out of breath.

## 2022-11-08 NOTE — Telephone Encounter (Signed)
Patient states difficulty with SOB and gasping when talking.  States he gets SOB to the point he can't remember what he is talking about. This has been for several months but worse this week and having difficulty with it. Chest pain yesterday and this morning with heavy pressure and arm pain. Sometimes to mid back as well he says. None to jaw.  He states Friday he took 4 doses but did not resolve and then realized the nitro got washed and dried and lost its potency.  He got a new bottle. He took Nitro yesterday times one dose and resolved chest pain.  Took nitro today times one dose resolved chest pain. Currently no chest pain just SOB.  He is using his oxygen during the day now where he was just using at night.  O2 at 2L pnc He states he just wants his aortic valve fixed.  He refuses to go to the ED as he will just lay there, he will only go if they are going to fix his problems.  Scheduled for [address] with Margaretha Glassing, NP for 4/11 at 10:55

## 2022-11-09 ENCOUNTER — Encounter: Payer: Self-pay | Admitting: Psychology

## 2022-11-09 NOTE — Telephone Encounter (Signed)
Left message to make patient aware-appt tomorrow has been rescheduled.   Patient will now see Dr. Bjorn Pippin at 1040 at the [facility] office.     Appt date, time, and location provided.

## 2022-11-09 NOTE — Telephone Encounter (Signed)
Needs to go to ER for evaluation if having chest pain and shortness of breath

## 2022-11-09 NOTE — Telephone Encounter (Signed)
Left message to call back  

## 2022-11-10 ENCOUNTER — Encounter: Payer: Self-pay | Admitting: Cardiology

## 2022-11-10 ENCOUNTER — Ambulatory Visit: Payer: Medicare Other | Attending: Physician Assistant | Admitting: Cardiology

## 2022-11-10 ENCOUNTER — Ambulatory Visit: Payer: Medicare Other | Admitting: Physician Assistant

## 2022-11-10 ENCOUNTER — Ambulatory Visit (INDEPENDENT_AMBULATORY_CARE_PROVIDER_SITE_OTHER): Payer: Medicare Other | Admitting: Neurology

## 2022-11-10 ENCOUNTER — Other Ambulatory Visit: Payer: Self-pay | Admitting: Family

## 2022-11-10 ENCOUNTER — Telehealth: Payer: Self-pay | Admitting: *Deleted

## 2022-11-10 VITALS — BP 142/84 | HR 63 | Ht 67.0 in | Wt 217.6 lb

## 2022-11-10 DIAGNOSIS — Z951 Presence of aortocoronary bypass graft: Secondary | ICD-10-CM

## 2022-11-10 DIAGNOSIS — I2510 Atherosclerotic heart disease of native coronary artery without angina pectoris: Secondary | ICD-10-CM

## 2022-11-10 DIAGNOSIS — R4182 Altered mental status, unspecified: Secondary | ICD-10-CM | POA: Diagnosis not present

## 2022-11-10 DIAGNOSIS — G4733 Obstructive sleep apnea (adult) (pediatric): Secondary | ICD-10-CM

## 2022-11-10 DIAGNOSIS — N179 Acute kidney failure, unspecified: Secondary | ICD-10-CM

## 2022-11-10 DIAGNOSIS — I10 Essential (primary) hypertension: Secondary | ICD-10-CM | POA: Diagnosis not present

## 2022-11-10 DIAGNOSIS — I5032 Chronic diastolic (congestive) heart failure: Secondary | ICD-10-CM

## 2022-11-10 DIAGNOSIS — I350 Nonrheumatic aortic (valve) stenosis: Secondary | ICD-10-CM | POA: Diagnosis not present

## 2022-11-10 DIAGNOSIS — G3184 Mild cognitive impairment, so stated: Secondary | ICD-10-CM

## 2022-11-10 DIAGNOSIS — R079 Chest pain, unspecified: Secondary | ICD-10-CM

## 2022-11-10 DIAGNOSIS — G4719 Other hypersomnia: Secondary | ICD-10-CM

## 2022-11-10 MED ORDER — NITROGLYCERIN 0.4 MG SL SUBL: 0.4000 mg | SUBLINGUAL_TABLET | SUBLINGUAL | 3 refills | Status: DC | PRN

## 2022-11-10 MED ORDER — ISOSORBIDE MONONITRATE ER 30 MG PO TB24: 45.0000 mg | ORAL_TABLET | Freq: Every day | ORAL | 3 refills | Status: DC

## 2022-11-10 NOTE — Progress Notes (Signed)
Cardiology Office Note:    Date:  11/10/2022   ID:  Johnny HoughFrank [name], DOB 1945-10-11, MRN [id number]  PCP:  Caesar BookmanNgetich, Dinah C, NP  Cardiologist:  Little Ishikawahristopher L Cyriah Childrey, MD  Electrophysiologist:  None   Referring MD: Caesar BookmanNgetich, Dinah C, NP   Chief Complaint  Patient presents with   Coronary Artery Disease    History of Present Illness:    Johnny [name] is a 77 y.o. male with a hx of CAD status post CABG in 2015, moderate aortic stenosis, diastolic heart failure, OSA, atrial fibrillation, symptomatic bradycardia status post PPM who presents for hospital follow-up.  He was admitted 11/2021 with chest pain.  He had rmoved from [place] in 09/2021.  Echocardiogram 12/15/2021 showed EF 55 to 60%, low normal RV function, mild to moderate left atrial enlargement, small pericardial effusion, moderate aortic stenosis.  Lexiscan Myoview on 12/16/2021 showed no ischemia or infarction, EF 58%.  Echocardiogram 06/2022 showed EF 65 to 70%, normal RV function, moderate aortic stenosis (mean gradient 17 mmHg, V-max 2.9 m/s, VTI 1.2 cm, DI 0.37).  Since last clinic visit, he reports chest pain and shortness of breath.  Reports having symptoms with minimal exertion.  Feels pressure in chest with walking.  Also reports having lightheadedness but denies any syncope.  Reports is having swelling in his legs.  Had recent fall and broke his nose.  States that he slipped on the curb, denies any loss of consciousness.    Wt Readings from Last 3 Encounters:  11/10/22 217 lb 9.6 oz (98.7 kg)  11/02/22 210 lb (95.3 kg)  10/10/22 211 lb 12.8 oz (96.1 kg)     Past Medical History:  Diagnosis Date   Atrial fibrillation    Deafness in left ear    Hypertension    Symptomatic bradycardia 04/2021   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    Past Surgical History:  Procedure Laterality Date   BACK SURGERY  1975   Lower Back Surgery, Dr.Seltzer   BACK SURGERY  1985   Dr.Seltzer   SHOULDER SURGERY Left 1965   Dr.Seltzer    TONSILLECTOMY  1950   Dr.Perellie    Current Medications: Current Meds  Medication Sig   albuterol (VENTOLIN HFA) 108 (90 Base) MCG/ACT inhaler Inhale 1 puff into the lungs every 6 (six) hours as needed for wheezing or shortness of breath.   apixaban (ELIQUIS) 5 MG TABS tablet Take 2.5 mg tablet one by mouth twice daily x 5 days then resume 5 mg tablet one by mouth twice daily when done with Paxlovid.   carvedilol (COREG) 6.25 MG tablet TAKE 1 TABLET BY MOUTH TWICE  DAILY   cyanocobalamin (VITAMIN B12) 1000 MCG tablet Take 1 tablet (1,000 mcg total) by mouth daily.   famotidine (PEPCID) 40 MG tablet Take 40 mg by mouth daily.   fluticasone (FLONASE) 50 MCG/ACT nasal spray Place 2 sprays into both nostrils 3 (three) times daily as needed for allergies or rhinitis.   furosemide (LASIX) 40 MG tablet TAKE 1 TABLET BY MOUTH EVERY  OTHER DAY   lisinopril (ZESTRIL) 2.5 MG tablet Take 2.5 mg by mouth daily.   loratadine (CLARITIN) 10 MG tablet Take 1 tablet (10 mg total) by mouth daily.   montelukast (SINGULAIR) 10 MG tablet Take 10 mg by mouth daily as needed (for allergies).   pantoprazole (PROTONIX) 40 MG tablet Take 1 tablet (40 mg total) by mouth daily before breakfast.   rosuvastatin (CRESTOR) 40 MG tablet TAKE 1 TABLET BY MOUTH  DAILY   sodium chloride (OCEAN) 0.65 % SOLN nasal spray Place 1 spray into both nostrils as needed for congestion.   TYLENOL 8 HOUR ARTHRITIS PAIN 650 MG CR tablet Take 650-1,300 mg by mouth every 8 (eight) hours as needed for pain.   venlafaxine XR (EFFEXOR-XR) 150 MG 24 hr capsule Take 1 capsule (150 mg total) by mouth daily with breakfast.   [DISCONTINUED] isosorbide mononitrate (IMDUR) 30 MG 24 hr tablet TAKE 1 TABLET BY MOUTH DAILY   [DISCONTINUED] nitroGLYCERIN (NITROSTAT) 0.4 MG SL tablet Place 1 tablet (0.4 mg total) under the tongue every 5 (five) minutes as needed for chest pain.     Allergies:   Acacia and Metformin and related   Social History    Socioeconomic History   Marital status: Widowed    Spouse name: Not on file   Number of children: Not on file   Years of education: Not on file   Highest education level: Not on file  Occupational History   Not on file  Tobacco Use   Smoking status: Former    Packs/day: 1.00    Years: 10.00    Additional pack years: 0.00    Total pack years: 10.00    Types: Cigarettes   Smokeless tobacco: Never  Substance and Sexual Activity   Alcohol use: Never   Drug use: Never   Sexual activity: Not on file  Other Topics Concern   Not on file  Social History Narrative   Tobacco use, amount per day now: None   Past tobacco use, amount per day: 1 pack   How many years did you use tobacco: 10 years.   Alcohol use (drinks per week): None   Diet:   Do you drink/eat things with caffeine: Yes   Marital status:  Widow                                What year were you married? 1972   Do you live in a house, apartment, assisted living, condo, trailer, etc.? House   Is it one or more stories? One   How many persons live in your home? One   Do you have pets in your home?( please list) No   Highest Level of education completed? 12th grade.   Current or past profession: Psychologist, occupational, Investment banker, operational, Clinical biochemist.    Do you exercise?   No                               Type and how often?   Do you have a living will? Yes   Do you have a DNR form?     No                              If not, do you want to discuss one?   Do you have signed POA/HPOA forms?  Yes                      If so, please bring to you appointment      Do you have any difficulty bathing or dressing yourself? No   Do you have any difficulty preparing food or eating? No   Do you have any difficulty managing your medications? No   Do you have any difficulty managing your finances? Yes  Do you have any difficulty affording your medications? No   Social Determinants of Health   Financial Resource Strain: Not on file  Food Insecurity: No  Food Insecurity (06/19/2022)   Hunger Vital Sign    Worried About Running Out of Food in the Last Year: Never true    Ran Out of Food in the Last Year: Never true  Transportation Needs: No Transportation Needs (06/19/2022)   PRAPARE - Administrator, Civil Service (Medical): No    Lack of Transportation (Non-Medical): No  Physical Activity: Not on file  Stress: Not on file  Social Connections: Not on file     Family History: The patient's family history includes Cancer in his father; Stroke in his brother and sister.  ROS:   Please see the history of present illness.     All other systems reviewed and are negative.  EKGs/Labs/Other Studies Reviewed:    The following studies were reviewed today:   EKG:   12/29/2021: Atrial paced rhythm, rate 60, diffuse less than 1 mm ST depressions 11/10/2022: Atrial paced rhythm, rate 63, nonspecific T wave flattening  Recent Labs: 12/29/2021: Magnesium 1.9 09/13/2022: B Natriuretic Peptide 74.4 10/10/2022: ALT 25; BUN 25; Creat 1.90; Hemoglobin 16.1; Platelets 200; Potassium 4.1; Sodium 141 11/02/2022: TSH 1.900  Recent Lipid Panel    Component Value Date/Time   CHOL 158 10/10/2022 1510   TRIG 154 (H) 10/10/2022 1510   HDL 70 10/10/2022 1510   CHOLHDL 2.3 10/10/2022 1510   VLDL 49 (H) 06/19/2022 1519   LDLCALC 64 10/10/2022 1510    Physical Exam:    VS:  BP (!) 142/84   Pulse 63   Ht 5\' 7"  (1.702 m)   Wt 217 lb 9.6 oz (98.7 kg)   BMI 34.08 kg/m     Wt Readings from Last 3 Encounters:  11/10/22 217 lb 9.6 oz (98.7 kg)  11/02/22 210 lb (95.3 kg)  10/10/22 211 lb 12.8 oz (96.1 kg)     GEN:  Well nourished, well developed in no acute distress HEENT: Normal NECK: No JVD; No carotid bruits CARDIAC: RRR, 2/6 systolic murmur RESPIRATORY:  Clear to auscultation without rales, wheezing or rhonchi  ABDOMEN: Soft, non-tender, non-distended MUSCULOSKELETAL:  No edema; No deformity  SKIN: Warm and dry NEUROLOGIC:  Alert and  oriented x 3 PSYCHIATRIC:  Normal affect   ASSESSMENT:    1. CAD in native artery   2. S/P CABG (coronary artery bypass graft)   3. Chest pain of uncertain etiology   4. Essential hypertension   5. Aortic valve stenosis, etiology of cardiac valve disease unspecified   6. Chronic diastolic HF (heart failure)   7. AKI (acute kidney injury)   8. OSA (obstructive sleep apnea)     PLAN:    CAD: status post CABG.  Coronary angiography 06/07/21: "normal left main with severe mid-LAD disease and patent LIMA graft to LAD but with slow flow in the distal graft with possible filling defect vs streaming; in either case, the distal vessel is very small, too small for PCI. There were patent stents in second obtuse marginal branch. Distal circumflex was chronically occluded with right to left collaterals. Sequential SVG to two OM branches was chronically occluded. RCA contained a widely patent stent in proximal portion".  Echocardiogram 12/15/2021 showed EF 55 to 60%, low normal RV function, mild to moderate left atrial enlargement, small pericardial effusion, moderate aortic stenosis.  Lexiscan Myoview on 12/16/2021 showed no ischemia or infarction, EF  58%. -Continue aspirin, statin -Carvedilol 6.25 mg twice daily -As needed sublingual nitroglycerin -Reports symptoms concerning for angina, as describes exertional chest pressure.  Will increase Imdur to 45 mg daily.  Recommend stress PET for further evaluation.  Given his renal dysfunction, would hold off on cath unless high risk stress test   Aortic stenosis: Moderate on echocardiogram 12/15/2021.  Repeat echo during hospitalization 06/2022 showed EF 65 to 70%, normal RV function, moderate aortic stenosis (mean gradient 17 mmHg, V-max 2.9 m/s, VTI 1.2 cm, DI 0.37).  Will monitor   Chronic diastolic heart failure: Takes lasix 40 mg qod.  Appears euvolemic.  Check BMET, magnesium   Atrial fibrillation: On Eliquis 5 mg twice daily and carvedilol 6.25 mg twice  daily.  In atrial paced rhythm.     Symptomatic bradycardia: Status post [employer] PPM.  Refer to Dr. Royann Shivers for device follow-up   Hypertension: On carvedilol 6.25 mg twice daily and lisinopril 2.5 mg daily and Imdur 30 mg daily.  Increase Imdur to 45 mg daily as above   AKI on CKD stage IIIa: Baseline creatinine appears 1.4-1.5.  Creatinine 1.9 on 10/10/2022.  Check BMET   OSA: Severe OSA on sleep study 12/2021, needs BiPAP but has not started.  Will send message to sleep coordinator    Hyperlipidemia: On rosuvastatin 40 mg daily.  LDL 40 on 06/19/2022   RTC in 3 months  Shared Decision Making/Informed Consent The risks [chest pain, shortness of breath, cardiac arrhythmias, dizziness, blood pressure fluctuations, myocardial infarction, stroke/transient ischemic attack, nausea, vomiting, allergic reaction, radiation exposure, metallic taste sensation and life-threatening complications (estimated to be 1 in 10,000)], benefits (risk stratification, diagnosing coronary artery disease, treatment guidance) and alternatives of a cardiac PET stress test were discussed in detail with Mr. Belli and he agrees to proceed.   Medication Adjustments/Labs and Tests Ordered: Current medicines are reviewed at length with the patient today.  Concerns regarding medicines are outlined above.  Orders Placed This Encounter  Procedures   NM PET CT CARDIAC PERFUSION MULTI W/ABSOLUTE BLOODFLOW   Basic metabolic panel   Magnesium   EKG 12-Lead   Meds ordered this encounter  Medications   nitroGLYCERIN (NITROSTAT) 0.4 MG SL tablet    Sig: Place 1 tablet (0.4 mg total) under the tongue every 5 (five) minutes as needed for chest pain.    Dispense:  25 tablet    Refill:  3   isosorbide mononitrate (IMDUR) 30 MG 24 hr tablet    Sig: Take 1.5 tablets (45 mg total) by mouth daily.    Dispense:  135 tablet    Refill:  3    Dose increase    Patient Instructions  Medication Instructions:  INCREASE  isosorbide (Imdur) to 45 mg daily  *If you need a refill on your cardiac medications before your next appointment, please call your pharmacy*   Lab Work: BMET, Mag today  If you have labs (blood work) drawn today and your tests are completely normal, you will receive your results only by: MyChart Message (if you have MyChart) OR A paper copy in the mail If you have any lab test that is abnormal or we need to change your treatment, we will call you to review the results.   Testing/Procedures: CARDIAC PET- Your physician has requested that you have a Cardiac Pet Stress Test. This testing is completed at [facility] ([address] [place], [place] [address]). The schedulers will call you to get this scheduled. Please  follow instructions below and call the office with any questions/concerns [phone]).  Follow-Up: At [facility], you and your health needs are our priority.  As part of our continuing mission to provide you with exceptional heart care, we have created designated Provider Care Teams.  These Care Teams include your primary Cardiologist (physician) and Advanced Practice Providers (APPs -  Physician Assistants and Nurse Practitioners) who all work together to provide you with the care you need, when you need it.  We recommend signing up for the patient portal called "MyChart".  Sign up information is provided on this After Visit Summary.  MyChart is used to connect with patients for Virtual Visits (Telemedicine).  Patients are able to view lab/test results, encounter notes, upcoming appointments, etc.  Non-urgent messages can be sent to your provider as well.   To learn more about what you can do with MyChart, go to [email or web address].    Your next appointment:   3 month(s)  Provider:   Little Ishikawa, MD     Other Instructions  Call sleep coordinator: [phone]   How to Prepare for Your Cardiac PET/CT Stress Test:  1.  Please do not take these medications before your test:   Medications that may interfere with the cardiac pharmacological stress agent (ex. nitrates - including erectile dysfunction medications, isosorbide mononitrate, tamulosin or beta-blockers) the day of the exam. (Erectile dysfunction medication should be held for at least 72 hrs prior to test) Theophylline containing medications for 12 hours. Dipyridamole 48 hours prior to the test. Your remaining medications may be taken with water.  2. Nothing to eat or drink, except water, 3 hours prior to arrival time.   NO caffeine/decaffeinated products, or chocolate 12 hours prior to arrival.  3. NO perfume, cologne or lotion  4. Total time is 1 to 2 hours; you may want to bring reading material for the waiting time.  5. Please report to Radiology at the [facility] Main Entrance 30 minutes early for your test.  [address] [place], [place] [address]  Diabetic Preparation:  Hold oral medications. You may take NPH and Lantus insulin. Do not take Humalog or Humulin R (Regular Insulin) the day of your test. Check blood sugars prior to leaving the house. If able to eat breakfast prior to 3 hour fasting, you may take all medications, including your insulin, Do not worry if you miss your breakfast dose of insulin - start at your next meal.  IF YOU THINK YOU MAY BE PREGNANT, OR ARE NURSING PLEASE INFORM THE TECHNOLOGIST.  In preparation for your appointment, medication and supplies will be purchased.  Appointment availability is limited, so if you need to cancel or reschedule, please call the Radiology Department at [phone]  24 hours in advance to avoid a cancellation fee of $100.00  What to Expect After you Arrive:  Once you arrive and check in for your appointment, you will be taken to a preparation room within the Radiology Department.  A technologist or Nurse will obtain your medical history, verify that you are correctly  prepped for the exam, and explain the procedure.  Afterwards,  an IV will be started in your arm and electrodes will be placed on your skin for EKG monitoring during the stress portion of the exam. Then you will be escorted to the PET/CT scanner.  There, staff will get you positioned on the scanner and obtain a blood pressure and EKG.  During the exam, you will continue to  be connected to the EKG and blood pressure machines.  A small, safe amount of a radioactive tracer will be injected in your IV to obtain a series of pictures of your heart along with an injection of a stress agent.    After your Exam:  It is recommended that you eat a meal and drink a caffeinated beverage to counter act any effects of the stress agent.  Drink plenty of fluids for the remainder of the day and urinate frequently for the first couple of hours after the exam.  Your doctor will inform you of your test results within 7-10 business days.  For questions about your test or how to prepare for your test, please call: Rockwell Alexandria, Cardiac Imaging Nurse Navigator  Larey Brick, Cardiac Imaging Nurse Navigator Office: [phone]     Signed, Little Ishikawa, MD  11/10/2022 11:52 AM    [facility]

## 2022-11-10 NOTE — Telephone Encounter (Signed)
Spoke to patient-aware of appointment time and location change.

## 2022-11-10 NOTE — Procedures (Signed)
    History:  77 year old man with cognitive impairment   EEG classification:  Awake and asleep  Description of the recording: The background rhythms of this recording consists of low amplitude, symmetric background activity. As the record progresses, the patient initially is in the waking state, but appears to enter the early stage II sleep during the recording, with rudimentary sleep spindles and vertex sharp wave activity seen. During the wakeful state, photic stimulation is performed, and no abnormal responses were seen. Hyperventilation was also performed, no abnormal response seen. No epileptiform discharges seen during this recording. There was no focal slowing.   Abnormality: Mild diffuse slowing   Impression: This is an abnormal EEG recording in the waking and sleeping state due to diffuse background slowing. This is consistent with a generalized brain dysfunction, nonspecific.   Windell Norfolk, MD [facility]

## 2022-11-10 NOTE — Patient Instructions (Addendum)
Medication Instructions:  INCREASE isosorbide (Imdur) to 45 mg daily  *If you need a refill on your cardiac medications before your next appointment, please call your pharmacy*   Lab Work: BMET, Mag today  If you have labs (blood work) drawn today and your tests are completely normal, you will receive your results only by: MyChart Message (if you have MyChart) OR A paper copy in the mail If you have any lab test that is abnormal or we need to change your treatment, we will call you to review the results.   Testing/Procedures: CARDIAC PET- Your physician has requested that you have a Cardiac Pet Stress Test. This testing is completed at [facility] ([address] [place], [place] [address]). The schedulers will call you to get this scheduled. Please follow instructions below and call the office with any questions/concerns [phone]).  Follow-Up: At [facility], you and your health needs are our priority.  As part of our continuing mission to provide you with exceptional heart care, we have created designated Provider Care Teams.  These Care Teams include your primary Cardiologist (physician) and Advanced Practice Providers (APPs -  Physician Assistants and Nurse Practitioners) who all work together to provide you with the care you need, when you need it.  We recommend signing up for the patient portal called "MyChart".  Sign up information is provided on this After Visit Summary.  MyChart is used to connect with patients for Virtual Visits (Telemedicine).  Patients are able to view lab/test results, encounter notes, upcoming appointments, etc.  Non-urgent messages can be sent to your provider as well.   To learn more about what you can do with MyChart, go to [email or web address].    Your next appointment:   3 month(s)  Provider:   Little Ishikawa, MD     Other Instructions  Call sleep coordinator: [phone]   How to Prepare for Your  Cardiac PET/CT Stress Test:  1. Please do not take these medications before your test:   Medications that may interfere with the cardiac pharmacological stress agent (ex. nitrates - including erectile dysfunction medications, isosorbide mononitrate, tamulosin or beta-blockers) the day of the exam. (Erectile dysfunction medication should be held for at least 72 hrs prior to test) Theophylline containing medications for 12 hours. Dipyridamole 48 hours prior to the test. Your remaining medications may be taken with water.  2. Nothing to eat or drink, except water, 3 hours prior to arrival time.   NO caffeine/decaffeinated products, or chocolate 12 hours prior to arrival.  3. NO perfume, cologne or lotion  4. Total time is 1 to 2 hours; you may want to bring reading material for the waiting time.  5. Please report to Radiology at the [facility] Main Entrance 30 minutes early for your test.  [address] [place], [place] [address]  Diabetic Preparation:  Hold oral medications. You may take NPH and Lantus insulin. Do not take Humalog or Humulin R (Regular Insulin) the day of your test. Check blood sugars prior to leaving the house. If able to eat breakfast prior to 3 hour fasting, you may take all medications, including your insulin, Do not worry if you miss your breakfast dose of insulin - start at your next meal.  IF YOU THINK YOU MAY BE PREGNANT, OR ARE NURSING PLEASE INFORM THE TECHNOLOGIST.  In preparation for your appointment, medication and supplies will be purchased.  Appointment availability is limited, so if you need to cancel  or reschedule, please call the Radiology Department at [phone]  24 hours in advance to avoid a cancellation fee of $100.00  What to Expect After you Arrive:  Once you arrive and check in for your appointment, you will be taken to a preparation room within the Radiology Department.  A technologist or Nurse will obtain your medical  history, verify that you are correctly prepped for the exam, and explain the procedure.  Afterwards,  an IV will be started in your arm and electrodes will be placed on your skin for EKG monitoring during the stress portion of the exam. Then you will be escorted to the PET/CT scanner.  There, staff will get you positioned on the scanner and obtain a blood pressure and EKG.  During the exam, you will continue to be connected to the EKG and blood pressure machines.  A small, safe amount of a radioactive tracer will be injected in your IV to obtain a series of pictures of your heart along with an injection of a stress agent.    After your Exam:  It is recommended that you eat a meal and drink a caffeinated beverage to counter act any effects of the stress agent.  Drink plenty of fluids for the remainder of the day and urinate frequently for the first couple of hours after the exam.  Your doctor will inform you of your test results within 7-10 business days.  For questions about your test or how to prepare for your test, please call: Rockwell Alexandria, Cardiac Imaging Nurse Navigator  Larey Brick, Cardiac Imaging Nurse Navigator Office: [phone]

## 2022-11-10 NOTE — Telephone Encounter (Signed)
-----   Message from Harvel Ricks, RN sent at 11/10/2022 12:41 PM EDT ----- Regarding: Follow up Please call patient to discuss BiPAP.    He came in to see Bjorn Pippin today.    Thanks!

## 2022-11-11 LAB — BASIC METABOLIC PANEL
BUN/Creatinine Ratio: 10 (ref 10–24)
BUN: 17 mg/dL (ref 8–27)
CO2: 27 mmol/L (ref 20–29)
Calcium: 10.8 mg/dL — ABNORMAL HIGH (ref 8.6–10.2)
Chloride: 101 mmol/L (ref 96–106)
Creatinine, Ser: 1.76 mg/dL — ABNORMAL HIGH (ref 0.76–1.27)
Glucose: 85 mg/dL (ref 70–99)
Potassium: 5.3 mmol/L — ABNORMAL HIGH (ref 3.5–5.2)
Sodium: 142 mmol/L (ref 134–144)
eGFR: 40 mL/min/{1.73_m2} — ABNORMAL LOW (ref >59)

## 2022-11-11 LAB — MAGNESIUM: Magnesium: 1.8 mg/dL (ref 1.6–2.3)

## 2022-11-13 ENCOUNTER — Other Ambulatory Visit: Payer: Self-pay | Admitting: Family

## 2022-11-14 ENCOUNTER — Telehealth: Payer: Self-pay | Admitting: Cardiology

## 2022-11-14 NOTE — Telephone Encounter (Signed)
Attempt to call patient to discuss lab results and follow up on previous message (do not see he went to ER).    Left message to call back

## 2022-11-14 NOTE — Telephone Encounter (Signed)
Spoke with pt and continues to note mild chest pain today Pt is going to take a ntg Pt had episode last night at 6:30 pm  and did take ntg pain did resolve but not until 8:00 pm  Instructed pt to go to ED for eval and tx B/P last night was 199/109 and today ws 175/40 Will forward to Dr Bjorn Pippin for review .Zack Seal

## 2022-11-14 NOTE — Telephone Encounter (Signed)
Amiodarone is managed by cardiologist and not on medication list since seen by cardiologist on 11/10/2022 will need to very with Dr. Epifanio Lesches

## 2022-11-14 NOTE — Telephone Encounter (Signed)
  Pt c/o of Chest Pain: STAT if CP now or developed within 24 hours  1. Are you having CP right now? Mild CP  2. Are you experiencing any other symptoms (ex. SOB, nausea, vomiting, sweating)? Sweating   3. How long have you been experiencing CP? Last night   4. Is your CP continuous or coming and going?  coming and going  5. Have you taken Nitroglycerin? Nitroglycerin    Pt said, last night he woken up by chest pain and he is drench in sweat, this  around 6:30 pm, he took nitroglycerin and pain went away around at 8:00 pm  his BP last night was 199/109 and today 175/40

## 2022-11-15 ENCOUNTER — Other Ambulatory Visit: Payer: Self-pay

## 2022-11-15 ENCOUNTER — Inpatient Hospital Stay (HOSPITAL_COMMUNITY)
Admission: EM | Admit: 2022-11-15 | Discharge: 2022-11-18 | DRG: 287 | Disposition: A | Discharge status: Home / Self Care | Payer: Medicare Other | Attending: Internal Medicine | Admitting: Internal Medicine

## 2022-11-15 ENCOUNTER — Emergency Department (HOSPITAL_COMMUNITY): Payer: Medicare Other

## 2022-11-15 ENCOUNTER — Encounter (HOSPITAL_COMMUNITY): Payer: Self-pay | Admitting: *Deleted

## 2022-11-15 DIAGNOSIS — I25710 Atherosclerosis of autologous vein coronary artery bypass graft(s) with unstable angina pectoris: Secondary | ICD-10-CM | POA: Diagnosis not present

## 2022-11-15 DIAGNOSIS — I5032 Chronic diastolic (congestive) heart failure: Secondary | ICD-10-CM | POA: Diagnosis present

## 2022-11-15 DIAGNOSIS — N179 Acute kidney failure, unspecified: Secondary | ICD-10-CM | POA: Diagnosis not present

## 2022-11-15 DIAGNOSIS — Z888 Allergy status to other drugs, medicaments and biological substances status: Secondary | ICD-10-CM

## 2022-11-15 DIAGNOSIS — I10 Essential (primary) hypertension: Secondary | ICD-10-CM | POA: Diagnosis present

## 2022-11-15 DIAGNOSIS — I25110 Atherosclerotic heart disease of native coronary artery with unstable angina pectoris: Secondary | ICD-10-CM | POA: Diagnosis not present

## 2022-11-15 DIAGNOSIS — I252 Old myocardial infarction: Secondary | ICD-10-CM

## 2022-11-15 DIAGNOSIS — I480 Paroxysmal atrial fibrillation: Secondary | ICD-10-CM | POA: Diagnosis present

## 2022-11-15 DIAGNOSIS — H9192 Unspecified hearing loss, left ear: Secondary | ICD-10-CM | POA: Diagnosis not present

## 2022-11-15 DIAGNOSIS — I2489 Other forms of acute ischemic heart disease: Secondary | ICD-10-CM | POA: Diagnosis present

## 2022-11-15 DIAGNOSIS — R7989 Other specified abnormal findings of blood chemistry: Secondary | ICD-10-CM | POA: Diagnosis not present

## 2022-11-15 DIAGNOSIS — E785 Hyperlipidemia, unspecified: Secondary | ICD-10-CM | POA: Diagnosis present

## 2022-11-15 DIAGNOSIS — I130 Hypertensive heart and chronic kidney disease with heart failure and stage 1 through stage 4 chronic kidney disease, or unspecified chronic kidney disease: Secondary | ICD-10-CM | POA: Diagnosis present

## 2022-11-15 DIAGNOSIS — R0902 Hypoxemia: Secondary | ICD-10-CM | POA: Diagnosis not present

## 2022-11-15 DIAGNOSIS — R001 Bradycardia, unspecified: Secondary | ICD-10-CM | POA: Diagnosis present

## 2022-11-15 DIAGNOSIS — I214 Non-ST elevation (NSTEMI) myocardial infarction: Secondary | ICD-10-CM | POA: Diagnosis not present

## 2022-11-15 DIAGNOSIS — I2582 Chronic total occlusion of coronary artery: Secondary | ICD-10-CM | POA: Diagnosis not present

## 2022-11-15 DIAGNOSIS — Z596 Low income: Secondary | ICD-10-CM

## 2022-11-15 DIAGNOSIS — Z7901 Long term (current) use of anticoagulants: Secondary | ICD-10-CM

## 2022-11-15 DIAGNOSIS — Z950 Presence of cardiac pacemaker: Secondary | ICD-10-CM | POA: Diagnosis not present

## 2022-11-15 DIAGNOSIS — R079 Chest pain, unspecified: Secondary | ICD-10-CM | POA: Diagnosis not present

## 2022-11-15 DIAGNOSIS — E1142 Type 2 diabetes mellitus with diabetic polyneuropathy: Secondary | ICD-10-CM | POA: Diagnosis present

## 2022-11-15 DIAGNOSIS — I350 Nonrheumatic aortic (valve) stenosis: Secondary | ICD-10-CM | POA: Diagnosis present

## 2022-11-15 DIAGNOSIS — R0789 Other chest pain: Secondary | ICD-10-CM | POA: Diagnosis not present

## 2022-11-15 DIAGNOSIS — J9611 Chronic respiratory failure with hypoxia: Secondary | ICD-10-CM | POA: Diagnosis not present

## 2022-11-15 DIAGNOSIS — I499 Cardiac arrhythmia, unspecified: Secondary | ICD-10-CM | POA: Diagnosis not present

## 2022-11-15 DIAGNOSIS — Z87891 Personal history of nicotine dependence: Secondary | ICD-10-CM | POA: Diagnosis not present

## 2022-11-15 DIAGNOSIS — E1122 Type 2 diabetes mellitus with diabetic chronic kidney disease: Secondary | ICD-10-CM | POA: Diagnosis not present

## 2022-11-15 DIAGNOSIS — Z955 Presence of coronary angioplasty implant and graft: Secondary | ICD-10-CM

## 2022-11-15 DIAGNOSIS — N1832 Chronic kidney disease, stage 3b: Secondary | ICD-10-CM | POA: Diagnosis present

## 2022-11-15 DIAGNOSIS — U071 COVID-19: Secondary | ICD-10-CM | POA: Diagnosis not present

## 2022-11-15 DIAGNOSIS — Z743 Need for continuous supervision: Secondary | ICD-10-CM | POA: Diagnosis not present

## 2022-11-15 DIAGNOSIS — Z823 Family history of stroke: Secondary | ICD-10-CM

## 2022-11-15 DIAGNOSIS — E1151 Type 2 diabetes mellitus with diabetic peripheral angiopathy without gangrene: Secondary | ICD-10-CM | POA: Diagnosis present

## 2022-11-15 DIAGNOSIS — M1990 Unspecified osteoarthritis, unspecified site: Secondary | ICD-10-CM | POA: Diagnosis not present

## 2022-11-15 DIAGNOSIS — I2510 Atherosclerotic heart disease of native coronary artery without angina pectoris: Secondary | ICD-10-CM | POA: Diagnosis not present

## 2022-11-15 DIAGNOSIS — G4733 Obstructive sleep apnea (adult) (pediatric): Secondary | ICD-10-CM | POA: Diagnosis not present

## 2022-11-15 DIAGNOSIS — Z79899 Other long term (current) drug therapy: Secondary | ICD-10-CM

## 2022-11-15 DIAGNOSIS — R9431 Abnormal electrocardiogram [ECG] [EKG]: Secondary | ICD-10-CM | POA: Diagnosis not present

## 2022-11-15 LAB — PROTIME-INR
INR: 1.4 — ABNORMAL HIGH (ref 0.8–1.2)
Prothrombin Time: 16.6 seconds — ABNORMAL HIGH (ref 11.4–15.2)

## 2022-11-15 LAB — BASIC METABOLIC PANEL
Anion gap: 12 (ref 5–15)
BUN: 27 mg/dL — ABNORMAL HIGH (ref 8–23)
CO2: 26 mmol/L (ref 22–32)
Calcium: 10.4 mg/dL — ABNORMAL HIGH (ref 8.9–10.3)
Chloride: 99 mmol/L (ref 98–111)
Creatinine, Ser: 2.02 mg/dL — ABNORMAL HIGH (ref 0.61–1.24)
GFR, Estimated: 34 mL/min — ABNORMAL LOW (ref >60)
Glucose, Bld: 86 mg/dL (ref 70–99)
Potassium: 4 mmol/L (ref 3.5–5.1)
Sodium: 137 mmol/L (ref 135–145)

## 2022-11-15 LAB — HEPATIC FUNCTION PANEL
ALT: 30 U/L (ref 0–44)
AST: 29 U/L (ref 15–41)
Albumin: 3.8 g/dL (ref 3.5–5.0)
Alkaline Phosphatase: 65 U/L (ref 38–126)
Bilirubin, Direct: <0.1 mg/dL (ref 0.0–0.2)
Total Bilirubin: 0.7 mg/dL (ref 0.3–1.2)
Total Protein: 7.1 g/dL (ref 6.5–8.1)

## 2022-11-15 LAB — CBC WITH DIFFERENTIAL/PLATELET
Abs Immature Granulocytes: 0.03 10*3/uL (ref 0.00–0.07)
Basophils Absolute: 0 10*3/uL (ref 0.0–0.1)
Basophils Relative: 1 %
Eosinophils Absolute: 0.1 10*3/uL (ref 0.0–0.5)
Eosinophils Relative: 2 %
HCT: 45.8 % (ref 39.0–52.0)
Hemoglobin: 14.8 g/dL (ref 13.0–17.0)
Immature Granulocytes: 0 %
Lymphocytes Relative: 20 %
Lymphs Abs: 1.5 10*3/uL (ref 0.7–4.0)
MCH: 28.8 pg (ref 26.0–34.0)
MCHC: 32.3 g/dL (ref 30.0–36.0)
MCV: 89.3 fL (ref 80.0–100.0)
Monocytes Absolute: 0.9 10*3/uL (ref 0.1–1.0)
Monocytes Relative: 11 %
Neutro Abs: 5.2 10*3/uL (ref 1.7–7.7)
Neutrophils Relative %: 66 %
Platelets: 228 10*3/uL (ref 150–400)
RBC: 5.13 MIL/uL (ref 4.22–5.81)
RDW: 17.6 % — ABNORMAL HIGH (ref 11.5–15.5)
WBC: 7.8 10*3/uL (ref 4.0–10.5)
nRBC: 0 % (ref 0.0–0.2)

## 2022-11-15 LAB — MAGNESIUM: Magnesium: 1.9 mg/dL (ref 1.7–2.4)

## 2022-11-15 LAB — TROPONIN I (HIGH SENSITIVITY)
Troponin I (High Sensitivity): 27 ng/L — ABNORMAL HIGH (ref <18)
Troponin I (High Sensitivity): 27 ng/L — ABNORMAL HIGH (ref <18)

## 2022-11-15 LAB — CBG MONITORING, ED: Glucose-Capillary: 94 mg/dL (ref 70–99)

## 2022-11-15 LAB — LIPASE, BLOOD: Lipase: 37 U/L (ref 11–51)

## 2022-11-15 LAB — BRAIN NATRIURETIC PEPTIDE: B Natriuretic Peptide: 75.9 pg/mL (ref 0.0–100.0)

## 2022-11-15 MED ORDER — ONDANSETRON HCL 4 MG/2ML IJ SOLN: 4.0000 mg | Freq: Four times a day (QID) | INTRAMUSCULAR | Status: DC | PRN

## 2022-11-15 MED ORDER — ALUM & MAG HYDROXIDE-SIMETH 200-200-20 MG/5ML PO SUSP
30.0000 mL | Freq: Once | ORAL | Status: AC
  Administered 2022-11-15: 30 mL via ORAL
  Filled 2022-11-15: qty 30

## 2022-11-15 MED ORDER — NITROGLYCERIN 0.4 MG SL SUBL
0.4000 mg | SUBLINGUAL_TABLET | SUBLINGUAL | Status: DC | PRN
  Administered 2022-11-16 (×2): 0.4 mg via SUBLINGUAL
  Filled 2022-11-15 (×2): qty 1

## 2022-11-15 MED ORDER — ACETAMINOPHEN 325 MG PO TABS: 650.0000 mg | ORAL_TABLET | ORAL | Status: DC | PRN

## 2022-11-15 MED ORDER — FAMOTIDINE IN NACL 20-0.9 MG/50ML-% IV SOLN
20.0000 mg | Freq: Once | INTRAVENOUS | Status: AC
  Administered 2022-11-15: 20 mg via INTRAVENOUS
  Filled 2022-11-15: qty 50

## 2022-11-15 MED ORDER — ALBUTEROL SULFATE (2.5 MG/3ML) 0.083% IN NEBU: 2.5000 mg | INHALATION_SOLUTION | Freq: Four times a day (QID) | RESPIRATORY_TRACT | Status: DC | PRN

## 2022-11-15 MED ORDER — LORATADINE 10 MG PO TABS
10.0000 mg | ORAL_TABLET | Freq: Every day | ORAL | Status: DC
  Administered 2022-11-16 – 2022-11-18 (×3): 10 mg via ORAL
  Filled 2022-11-15 (×3): qty 1

## 2022-11-15 MED ORDER — MONTELUKAST SODIUM 10 MG PO TABS: 10.0000 mg | ORAL_TABLET | Freq: Every day | ORAL | Status: DC | PRN

## 2022-11-15 MED ORDER — VENLAFAXINE HCL ER 150 MG PO CP24
150.0000 mg | ORAL_CAPSULE | Freq: Every day | ORAL | Status: DC
  Administered 2022-11-16 – 2022-11-18 (×3): 150 mg via ORAL
  Filled 2022-11-15 (×2): qty 1
  Filled 2022-11-15: qty 2
  Filled 2022-11-15: qty 1

## 2022-11-15 MED ORDER — FLUTICASONE PROPIONATE 50 MCG/ACT NA SUSP: 2.0000 | Freq: Three times a day (TID) | NASAL | Status: DC | PRN

## 2022-11-15 MED ORDER — SALINE SPRAY 0.65 % NA SOLN: 1.0000 | NASAL | Status: DC | PRN

## 2022-11-15 MED ORDER — PANTOPRAZOLE SODIUM 40 MG PO TBEC
40.0000 mg | DELAYED_RELEASE_TABLET | Freq: Every day | ORAL | Status: DC
  Administered 2022-11-16 – 2022-11-18 (×3): 40 mg via ORAL
  Filled 2022-11-15 (×3): qty 1

## 2022-11-15 MED ORDER — VITAMIN B-12 1000 MCG PO TABS
1000.0000 ug | ORAL_TABLET | Freq: Every day | ORAL | Status: DC
  Administered 2022-11-16 – 2022-11-18 (×3): 1000 ug via ORAL
  Filled 2022-11-15 (×3): qty 1

## 2022-11-15 MED ORDER — FAMOTIDINE 20 MG PO TABS
40.0000 mg | ORAL_TABLET | Freq: Every day | ORAL | Status: DC
  Administered 2022-11-16 – 2022-11-18 (×3): 40 mg via ORAL
  Filled 2022-11-15 (×3): qty 2

## 2022-11-15 MED ORDER — HEPARIN (PORCINE) 25000 UT/250ML-% IV SOLN
1200.0000 [IU]/h | INTRAVENOUS | Status: DC
  Administered 2022-11-15 – 2022-11-16 (×2): 1200 [IU]/h via INTRAVENOUS
  Filled 2022-11-15 (×2): qty 250

## 2022-11-15 MED ORDER — ISOSORBIDE MONONITRATE ER 30 MG PO TB24: 45.0000 mg | ORAL_TABLET | Freq: Every day | ORAL | Status: DC

## 2022-11-15 MED ORDER — ALBUTEROL SULFATE HFA 108 (90 BASE) MCG/ACT IN AERS: 1.0000 | INHALATION_SPRAY | Freq: Four times a day (QID) | RESPIRATORY_TRACT | Status: DC | PRN

## 2022-11-15 MED ORDER — ASPIRIN 81 MG PO TBEC
81.0000 mg | DELAYED_RELEASE_TABLET | Freq: Every day | ORAL | Status: DC
  Administered 2022-11-16 – 2022-11-18 (×3): 81 mg via ORAL
  Filled 2022-11-15 (×3): qty 1

## 2022-11-15 MED ORDER — FENTANYL CITRATE PF 50 MCG/ML IJ SOSY
50.0000 ug | PREFILLED_SYRINGE | Freq: Once | INTRAMUSCULAR | Status: AC
  Administered 2022-11-15: 50 ug via INTRAVENOUS
  Filled 2022-11-15: qty 1

## 2022-11-15 MED ORDER — LIDOCAINE VISCOUS HCL 2 % MT SOLN
15.0000 mL | Freq: Once | OROMUCOSAL | Status: AC
  Administered 2022-11-15: 15 mL via ORAL
  Filled 2022-11-15: qty 15

## 2022-11-15 MED ORDER — CARVEDILOL 6.25 MG PO TABS
6.2500 mg | ORAL_TABLET | Freq: Two times a day (BID) | ORAL | Status: DC
  Administered 2022-11-15 – 2022-11-18 (×7): 6.25 mg via ORAL
  Filled 2022-11-15: qty 1
  Filled 2022-11-15: qty 2
  Filled 2022-11-15 (×2): qty 1
  Filled 2022-11-15: qty 2
  Filled 2022-11-15 (×2): qty 1

## 2022-11-15 MED ORDER — FUROSEMIDE 20 MG PO TABS
40.0000 mg | ORAL_TABLET | ORAL | Status: DC
  Administered 2022-11-16: 40 mg via ORAL
  Filled 2022-11-15: qty 2

## 2022-11-15 MED ORDER — ATORVASTATIN CALCIUM 80 MG PO TABS
80.0000 mg | ORAL_TABLET | Freq: Every day | ORAL | Status: DC
  Administered 2022-11-16 – 2022-11-18 (×3): 80 mg via ORAL
  Filled 2022-11-15: qty 1
  Filled 2022-11-15: qty 2
  Filled 2022-11-15: qty 1

## 2022-11-15 MED ORDER — NITROGLYCERIN 2 % TD OINT
1.0000 [in_us] | TOPICAL_OINTMENT | Freq: Once | TRANSDERMAL | Status: AC
  Administered 2022-11-15: 1 [in_us] via TOPICAL
  Filled 2022-11-15: qty 1

## 2022-11-15 MED ORDER — ONDANSETRON HCL 4 MG/2ML IJ SOLN
4.0000 mg | Freq: Once | INTRAMUSCULAR | Status: AC
  Administered 2022-11-15: 4 mg via INTRAVENOUS
  Filled 2022-11-15: qty 2

## 2022-11-15 NOTE — Telephone Encounter (Signed)
Did he not go to the ED?

## 2022-11-15 NOTE — ED Provider Notes (Signed)
[place] EMERGENCY DEPARTMENT AT [facility] Provider Note   CSN: [id number] Arrival date & time: 11/15/22  1735     History  Chief Complaint  Patient presents with   Chest Pain    Johnny [name] is a 77 y.o. male.   Chest Pain Associated symptoms: nausea and shortness of breath   Patient presents for chest pain.  Medical history includes T2DM, CKD, HLD, atrial fibrillation, OSA, CAD, PAD, CHF.  He underwent CABG in 2015.  He had a cath in 2022.  This showed multivessel disease.  He was seen in cardiology office 5 days ago.  Patient was recent worsening of exertional dyspnea.  2 days ago, he had left-sided chest pain, nausea, and diaphoresis.  Chest pain has persisted since that time.  Due to the persistence of symptoms, patient called EMS today.  EMS noted ST segment depressions on twelve-lead EKG.  He was given 324 of ASA and 2 SL NTG's.  Currently, pain is 4/10 in severity.  He does endorse continued nausea.  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     Home Medications Prior to Admission medications   Medication Sig Start Date End Date Taking? Authorizing Provider  albuterol (VENTOLIN HFA) 108 (90 Base) MCG/ACT inhaler Inhale 1 puff into the lungs every 6 (six) hours as needed for wheezing or shortness of breath.    [provider]  carvedilol (COREG) 6.25 MG tablet TAKE 1 TABLET BY MOUTH TWICE  DAILY 11/10/22   Ngetich, Dinah C, NP  cyanocobalamin (VITAMIN B12) 1000 MCG tablet Take 1 tablet (1,000 mcg total) by mouth daily. 11/05/22   Windell Norfolk, MD  ELIQUIS 5 MG TABS tablet TAKE 1 TABLET BY MOUTH TWICE  DAILY 11/14/22   Ngetich, Dinah C, NP  famotidine (PEPCID) 40 MG tablet Take 40 mg by mouth daily.    [provider]  fluticasone (FLONASE) 50 MCG/ACT nasal spray Place 2 sprays into both nostrils 3 (three) times daily as needed for allergies or rhinitis.    [provider]  furosemide (LASIX) 40 MG tablet TAKE 1 TABLET BY MOUTH EVERY  OTHER  DAY 11/07/22   Ngetich, Dinah C, NP  isosorbide mononitrate (IMDUR) 30 MG 24 hr tablet Take 1.5 tablets (45 mg total) by mouth daily. 11/10/22   Little Ishikawa, MD  lisinopril (ZESTRIL) 2.5 MG tablet Take 2.5 mg by mouth daily.    [provider]  loratadine (CLARITIN) 10 MG tablet Take 1 tablet (10 mg total) by mouth daily. 09/02/22   Ngetich, Dinah C, NP  montelukast (SINGULAIR) 10 MG tablet Take 10 mg by mouth daily as needed (for allergies).    [provider]  nitroGLYCERIN (NITROSTAT) 0.4 MG SL tablet Place 1 tablet (0.4 mg total) under the tongue every 5 (five) minutes as needed for chest pain. 11/10/22   Little Ishikawa, MD  pantoprazole (PROTONIX) 40 MG tablet Take 1 tablet (40 mg total) by mouth daily before breakfast. 09/02/22   Ngetich, Dinah C, NP  rosuvastatin (CRESTOR) 40 MG tablet TAKE 1 TABLET BY MOUTH DAILY 11/10/22   Ngetich, Dinah C, NP  sodium chloride (OCEAN) 0.65 % SOLN nasal spray Place 1 spray into both nostrils as needed for congestion. 09/02/22   Ngetich, Dinah C, NP  TYLENOL 8 HOUR ARTHRITIS PAIN 650 MG CR tablet Take 650-1,300 mg by mouth every 8 (eight) hours as needed for pain.    [provider]  venlafaxine XR (EFFEXOR-XR) 150 MG 24 hr capsule Take  1 capsule (150 mg total) by mouth daily with breakfast. 09/02/22   Ngetich, Dinah C, NP      Allergies    Acacia and Metformin and related    Review of Systems   Review of Systems  Respiratory:  Positive for shortness of breath.   Cardiovascular:  Positive for chest pain.  Gastrointestinal:  Positive for nausea.  All other systems reviewed and are negative.   Physical Exam Updated Vital Signs BP 123/83   Pulse 60   Temp 98.3 F (36.8 C) (Oral)   Resp 14   Ht  (1.702 m)   Wt 98.4 kg   SpO2 96%   BMI 33.98 kg/m  Physical Exam Vitals and nursing note reviewed.  Constitutional:      General: He is not in acute distress.    Appearance: He is well-developed. He is not  ill-appearing, toxic-appearing or diaphoretic.  HENT:     Head: Normocephalic and atraumatic.  Eyes:     Conjunctiva/sclera: Conjunctivae normal.  Cardiovascular:     Rate and Rhythm: Normal rate and regular rhythm.     Heart sounds: No murmur heard. Pulmonary:     Effort: Pulmonary effort is normal. No respiratory distress.     Breath sounds: Normal breath sounds. No decreased breath sounds, wheezing, rhonchi or rales.  Chest:     Chest wall: No tenderness.  Abdominal:     Palpations: Abdomen is soft.     Tenderness: There is no abdominal tenderness.  Musculoskeletal:        General: No swelling.     Cervical back: Normal range of motion and neck supple.  Skin:    General: Skin is warm and dry.     Capillary Refill: Capillary refill takes less than 2 seconds.     Coloration: Skin is not cyanotic or pale.  Neurological:     General: No focal deficit present.     Mental Status: He is alert and oriented to person, place, and time.  Psychiatric:        Mood and Affect: Mood normal.        Behavior: Behavior normal.     ED Results / Procedures / Treatments   Labs (all labs ordered are listed, but only abnormal results are displayed) Labs Reviewed  BASIC METABOLIC PANEL - Abnormal; Notable for the following components:      Result Value   BUN 27 (*)    Creatinine, Ser 2.02 (*)    Calcium 10.4 (*)    GFR, Estimated 34 (*)    All other components within normal limits  CBC WITH DIFFERENTIAL/PLATELET - Abnormal; Notable for the following components:   RDW 17.6 (*)    All other components within normal limits  PROTIME-INR - Abnormal; Notable for the following components:   Prothrombin Time 16.6 (*)    INR 1.4 (*)    All other components within normal limits  TROPONIN I (HIGH SENSITIVITY) - Abnormal; Notable for the following components:   Troponin I (High Sensitivity) 27 (*)    All other components within normal limits  TROPONIN I (HIGH SENSITIVITY) - Abnormal; Notable for  the following components:   Troponin I (High Sensitivity) 27 (*)    All other components within normal limits  MAGNESIUM  LIPASE, BLOOD  HEPATIC FUNCTION PANEL  BRAIN NATRIURETIC PEPTIDE  LIPOPROTEIN A (LPA)  CBG MONITORING, ED    EKG EKG Interpretation  Date/Time:  Tuesday November 15 2022 17:38:18 EDT Ventricular Rate:  60  PR Interval:  142 QRS Duration: 117 QT Interval:  470 QTC Calculation: 470 R Axis:   46 Text Interpretation: Sinus or ectopic atrial rhythm Nonspecific intraventricular conduction delay Nonspecific repol abnormality, diffuse leads Confirmed by Gloris Manchester [phone]) on 11/15/2022 5:47:12 PM  Radiology DG Chest Portable 1 View  Result Date: 11/15/2022 CLINICAL DATA:  Chest pain EXAM: PORTABLE CHEST 1 VIEW COMPARISON:  X-ray 09/13/2022 and older FINDINGS: Status post median sternotomy. Two of the upper wires are fractured. Left upper chest pacemaker. Underinflation. Blunting of the left costophrenic angle. Tiny effusion versus pleural thickening. No pneumothorax. Stable cardiopericardial silhouette with slight central vascular congestion. No consolidation. Degenerative changes of the right shoulder. Left shoulder arthroplasty with coracoid screw. Overlapping cardiac leads. IMPRESSION: Postop chest with underinflation. Tiny left effusion versus pleural thickening. Central vascular congestion. Pacemaker. Electronically Signed   By: Karen Kays M.D.   On: 11/15/2022 18:41    Procedures Procedures    Medications Ordered in ED Medications  aspirin EC tablet 81 mg (has no administration in time range)  nitroGLYCERIN (NITROSTAT) SL tablet 0.4 mg (has no administration in time range)  acetaminophen (TYLENOL) tablet 650 mg (has no administration in time range)  ondansetron (ZOFRAN) injection 4 mg (has no administration in time range)  atorvastatin (LIPITOR) tablet 80 mg (has no administration in time range)  carvedilol (COREG) tablet 6.25 mg (has no administration in time  range)  furosemide (LASIX) tablet 40 mg (has no administration in time range)  isosorbide mononitrate (IMDUR) 24 hr tablet 45 mg (has no administration in time range)  venlafaxine XR (EFFEXOR-XR) 24 hr capsule 150 mg (has no administration in time range)  famotidine (PEPCID) tablet 40 mg (has no administration in time range)  pantoprazole (PROTONIX) EC tablet 40 mg (has no administration in time range)  cyanocobalamin (VITAMIN B12) tablet 1,000 mcg (has no administration in time range)  albuterol (VENTOLIN HFA) 108 (90 Base) MCG/ACT inhaler 1 puff (has no administration in time range)  fluticasone (FLONASE) 50 MCG/ACT nasal spray 2 spray (has no administration in time range)  loratadine (CLARITIN) tablet 10 mg (has no administration in time range)  montelukast (SINGULAIR) tablet 10 mg (has no administration in time range)  sodium chloride (OCEAN) 0.65 % nasal spray 1 spray (has no administration in time range)  nitroGLYCERIN (NITROGLYN) 2 % ointment 1 inch (1 inch Topical Given 11/15/22 1803)  ondansetron (ZOFRAN) injection 4 mg (4 mg Intravenous Given 11/15/22 1803)  fentaNYL (SUBLIMAZE) injection 50 mcg (50 mcg Intravenous Given 11/15/22 1803)  alum & mag hydroxide-simeth (MAALOX/MYLANTA) 200-200-20 MG/5ML suspension 30 mL (30 mLs Oral Given 11/15/22 2005)    And  lidocaine (XYLOCAINE) 2 % viscous mouth solution 15 mL (15 mLs Oral Given 11/15/22 2005)  famotidine (PEPCID) IVPB 20 mg premix (0 mg Intravenous Stopped 11/15/22 2041)  fentaNYL (SUBLIMAZE) injection 50 mcg (50 mcg Intravenous Given 11/15/22 2135)    ED Course/ Medical Decision Making/ A&P                             Medical Decision Making Amount and/or Complexity of Data Reviewed Labs: ordered. Radiology: ordered.  Risk OTC drugs. Prescription drug management. Decision regarding hospitalization.   This patient presents to the ED for concern of chest pain, this involves an extensive number of treatment options, and is a  complaint that carries with it a high risk of complications and morbidity.  The differential diagnosis includes ACS, pneumonia, GERD, PUD  Co morbidities that complicate the patient evaluation  T2DM, CKD, HLD, atrial fibrillation, OSA, CAD, PAD, CHF   Additional history obtained:  Additional history obtained from N/A External records from outside source obtained and reviewed including EMR   Lab Tests:  I Ordered, and personally interpreted labs.  The pertinent results include: Normal hemoglobin, no leukocytosis, mild hypercalcemia with otherwise normal electrolytes, slight increase in creatinine from baseline, slight stable elevation in troponin   Imaging Studies ordered:  I ordered imaging studies including chest x-ray I independently visualized and interpreted imaging which showed central vascular congestion with possible trace left pleural effusion I agree with the radiologist interpretation   Cardiac Monitoring: / EKG:  The patient was maintained on a cardiac monitor.  I personally viewed and interpreted the cardiac monitored which showed an underlying rhythm of: Sinus rhythm   Consultations Obtained:  I requested consultation with the cardiologist, Dr. Welton Flakes,  and discussed lab and imaging findings as well as pertinent plan - they recommend: Admission to cardiology service   Problem List / ED Course / Critical interventions / Medication management  Patient presents for 2 days of chest pain in addition to recent exertional dyspnea, exercise intolerance, nausea.  He was given 324 of ASA and 2 SLN's disease prior to arrival with no relief in symptoms.  Currently pain is 4/10 in severity.  He does state that it was more severe 2 days ago.  EKG shows diffuse ST segment depressions.  Patient was placed on bedside cardiac monitor.  Laboratory workup was initiated.  Zofran and fentanyl were given for symptomatic relief.  Patient's lab work is notable for slight increase in  creatinine from baseline and slight elevation in troponin that is stable on repeat.  Patient had persistent pain.  He was treated empirically for possible GERD etiology.  He had no relief in symptoms from GI cocktail.  Fentanyl was ordered.  Given his cardiac history and ongoing chest pain, patient was admitted to cardiology for further management. I ordered medication including fentanyl, NTG, GI cocktail for analgesia; Zofran for nausea Reevaluation of the patient after these medicines showed that the patient stayed the same I have reviewed the patients home medicines and have made adjustments as needed   Social Determinants of Health:  Has access to outpatient care         Final Clinical Impression(s) / ED Diagnoses Final diagnoses:  Chest pain, unspecified type    Rx / DC Orders ED Discharge Orders     None         Gloris Manchester, MD 11/15/22 2321

## 2022-11-15 NOTE — Telephone Encounter (Signed)
Called pt back, no answer, left message with callback number.

## 2022-11-15 NOTE — Progress Notes (Signed)
ANTICOAGULATION CONSULT NOTE - Initial Consult  Pharmacy Consult for Heparin Indication: chest pain/ACS  Allergies  Allergen Reactions   Acacia Itching   Metformin And Related Other (See Comments)    Metformin caused the patient's legs to become weak    Patient Measurements: Weight: 98.4 kg (217 lb) Heparin Dosing Weight: 87.4 kg  Vital Signs: Temp: 98.3 F (36.8 C) (04/16 2212) Temp Source: Oral (04/16 2212) BP: 123/83 (04/16 2300) Pulse Rate: 60 (04/16 2300)  Labs: Recent Labs    11/15/22 1816 11/15/22 1950  HGB 14.8  --   HCT 45.8  --   PLT 228  --   LABPROT 16.6*  --   INR 1.4*  --   CREATININE 2.02*  --   TROPONINIHS 27* 27*    Estimated Creatinine Clearance: 34.8 mL/min (A) (by C-G formula based on SCr of 2.02 mg/dL (H)).   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 Patient is presenting with chest pain. Heparin per pharmacy consult placed for chest pain/ACS.  Patient is on apixaban prior to arrival. Last dose 4/16 ~1000 per patient. Will require aPTT monitoring due to likely falsely high anti-Xa level secondary to DOAC use.  Hgb 14.8; plt 228  Goal of Therapy:  Heparin level 0.3-0.7 units/ml aPTT 66-102 seconds Monitor platelets by anticoagulation protocol: Yes   Plan:  No initial heparin bolus Start heparin infusion at 1200 units/hr Check aPTT & anti-Xa level in 8 hours and daily while on heparin Continue to monitor via aPTT until levels are correlated Continue to monitor H&H and platelets  Delmar Landau, PharmD, BCPS 11/15/2022 11:17 PM ED Clinical Pharmacist -  [phone]

## 2022-11-15 NOTE — H&P (Signed)
Cardiology Admission History and Physical   Patient ID: Johnny [name] MRN: [id number]; DOB: [birth year]   Admission date: 11/15/2022  PCP:  Caesar Bookman, NP   [facility] Providers Cardiologist:  Little Ishikawa, MD        Chief Complaint:  Chest pain  Patient Profile:   Johnny [name] is a 77 y.o. male with hx of CAD status post CABG in 2015, moderate aortic stenosis, diastolic heart failure, OSA, atrial fibrillation, symptomatic bradycardia status post PPM who is being seen 11/15/2022 for the evaluation of chest pain.  History of Present Illness:   Johnny [name] is a 77 y.o. male with hx of CAD status post CABG in 2015, moderate aortic stenosis, diastolic heart failure, OSA, atrial fibrillation, symptomatic bradycardia status post PPM who is being seen 11/15/2022 for the evaluation of chest pain.  Lexiscan Myoview on 12/16/2021 showed no ischemia or infarction, EF 58%.  Echocardiogram 06/2022 showed EF 65 to 70%, normal RV function, moderate aortic stenosis (mean gradient 17 mmHg, V-max 2.9 m/s, VTI 1.2 cm, DI 0.37). He was seen in office last week for persistent SOB and plan was to do PET stress. However symptoms worsened and he started having CP hence came to the ED for evaluation. Reports having symptoms with minimal exertion.  Feels pressure in chest with walking. No leg swelling. In the ED troponin was 27- no delta. EKG however showed ST depressions in V2-V4. VS are stable in the ED. Cardiology was called for admission.    Past Medical History:  Diagnosis Date   Atrial fibrillation    Deafness in left ear    Hypertension    Symptomatic bradycardia 04/2021   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[place], [place]    Past Surgical History:  Procedure Laterality Date   BACK SURGERY  1975   Lower Back Surgery, Dr.Seltzer   BACK SURGERY  1985   Dr.Seltzer   SHOULDER SURGERY Left 1965   Dr.Seltzer   TONSILLECTOMY  1950   Dr.Perellie     Medications Prior to Admission: Prior to  Admission medications   Medication Sig Start Date End Date Taking? Authorizing Provider  albuterol (VENTOLIN HFA) 108 (90 Base) MCG/ACT inhaler Inhale 1 puff into the lungs every 6 (six) hours as needed for wheezing or shortness of breath.    [provider]  carvedilol (COREG) 6.25 MG tablet TAKE 1 TABLET BY MOUTH TWICE  DAILY 11/10/22   Ngetich, Dinah C, NP  cyanocobalamin (VITAMIN B12) 1000 MCG tablet Take 1 tablet (1,000 mcg total) by mouth daily. 11/05/22   Windell Norfolk, MD  ELIQUIS 5 MG TABS tablet TAKE 1 TABLET BY MOUTH TWICE  DAILY 11/14/22   Ngetich, Dinah C, NP  famotidine (PEPCID) 40 MG tablet Take 40 mg by mouth daily.    [provider]  fluticasone (FLONASE) 50 MCG/ACT nasal spray Place 2 sprays into both nostrils 3 (three) times daily as needed for allergies or rhinitis.    [provider]  furosemide (LASIX) 40 MG tablet TAKE 1 TABLET BY MOUTH EVERY  OTHER DAY 11/07/22   Ngetich, Dinah C, NP  isosorbide mononitrate (IMDUR) 30 MG 24 hr tablet Take 1.5 tablets (45 mg total) by mouth daily. 11/10/22   Little Ishikawa, MD  lisinopril (ZESTRIL) 2.5 MG tablet Take 2.5 mg by mouth daily.    [provider]  loratadine (CLARITIN) 10 MG tablet Take 1 tablet (10 mg total) by mouth daily. 09/02/22   Ngetich, Donalee Citrin, NP  montelukast (SINGULAIR) 10 MG tablet Take 10 mg by mouth daily as needed (for allergies).    [provider]  nitroGLYCERIN (NITROSTAT) 0.4 MG SL tablet Place 1 tablet (0.4 mg total) under the tongue every 5 (five) minutes as needed for chest pain. 11/10/22   Little Ishikawa, MD  pantoprazole (PROTONIX) 40 MG tablet Take 1 tablet (40 mg total) by mouth daily before breakfast. 09/02/22   Ngetich, Dinah C, NP  rosuvastatin (CRESTOR) 40 MG tablet TAKE 1 TABLET BY MOUTH DAILY 11/10/22   Ngetich, Dinah C, NP  sodium chloride (OCEAN) 0.65 % SOLN nasal spray Place 1 spray into both nostrils as needed for congestion. 09/02/22   Ngetich,  Dinah C, NP  TYLENOL 8 HOUR ARTHRITIS PAIN 650 MG CR tablet Take 650-1,300 mg by mouth every 8 (eight) hours as needed for pain.    [provider]  venlafaxine XR (EFFEXOR-XR) 150 MG 24 hr capsule Take 1 capsule (150 mg total) by mouth daily with breakfast. 09/02/22   Ngetich, Donalee Citrin, NP     Allergies:    Allergies  Allergen Reactions   Acacia Itching   Metformin And Related Other (See Comments)    Metformin caused the patient's legs to become weak    Social History:   Social History   Socioeconomic History   Marital status: Widowed    Spouse name: Not on file   Number of children: Not on file   Years of education: Not on file   Highest education level: Not on file  Occupational History   Not on file  Tobacco Use   Smoking status: Former    Packs/day: 1.00    Years: 10.00    Additional pack years: 0.00    Total pack years: 10.00    Types: Cigarettes   Smokeless tobacco: Never  Substance and Sexual Activity   Alcohol use: Never   Drug use: Never   Sexual activity: Not on file  Other Topics Concern   Not on file  Social History Narrative   Tobacco use, amount per day now: None   Past tobacco use, amount per day: 1 pack   How many years did you use tobacco: 10 years.   Alcohol use (drinks per week): None   Diet:   Do you drink/eat things with caffeine: Yes   Marital status:  Widow                                What year were you married? 1972   Do you live in a house, apartment, assisted living, condo, trailer, etc.? House   Is it one or more stories? One   How many persons live in your home? One   Do you have pets in your home?( please list) No   Highest Level of education completed? 12th grade.   Current or past profession: Psychologist, occupational, Investment banker, operational, Clinical biochemist.    Do you exercise?   No                               Type and how often?   Do you have a living will? Yes   Do you have a DNR form?     No  If not, do you want to discuss  one?   Do you have signed POA/HPOA forms?  Yes                      If so, please bring to you appointment      Do you have any difficulty bathing or dressing yourself? No   Do you have any difficulty preparing food or eating? No   Do you have any difficulty managing your medications? No   Do you have any difficulty managing your finances? Yes   Do you have any difficulty affording your medications? No   Social Determinants of Health   Financial Resource Strain: Not on file  Food Insecurity: No Food Insecurity (06/19/2022)   Hunger Vital Sign    Worried About Running Out of Food in the Last Year: Never true    Ran Out of Food in the Last Year: Never true  Transportation Needs: No Transportation Needs (06/19/2022)   PRAPARE - Administrator, Civil Service (Medical): No    Lack of Transportation (Non-Medical): No  Physical Activity: Not on file  Stress: Not on file  Social Connections: Not on file  Intimate Partner Violence: Not At Risk (06/19/2022)   Humiliation, Afraid, Rape, and Kick questionnaire    Fear of Current or Ex-Partner: No    Emotionally Abused: No    Physically Abused: No    Sexually Abused: No    Family History:  The patient's family history includes Cancer in his father; Stroke in his brother and sister.    ROS:  Please see the history of present illness.  All other ROS reviewed and negative.     Physical Exam/Data:   Vitals:   11/15/22 2215 11/15/22 2230 11/15/22 2300 11/15/22 2318  BP: 116/87 (!) 124/94 123/83   Pulse: 60 61 60   Resp: 16 15 14    Temp:      TempSrc:      SpO2: 97% 97% 96%   Weight:    98.4 kg  Height:    5\' 7"  (1.702 m)    Intake/Output Summary (Last 24 hours) at 11/15/2022 2325 Last data filed at 11/15/2022 2041 Gross per 24 hour  Intake 50 ml  Output --  Net 50 ml      11/15/2022   11:18 PM 11/15/2022    5:47 PM 11/10/2022   10:48 AM  Last 3 Weights  Weight (lbs) 216 lb 14.9 oz 217 lb 217 lb 9.6 oz  Weight (kg)  98.4 kg 98.431 kg 98.703 kg     Body mass index is 33.98 kg/m.  General:  Well nourished, well developed, in no acute distress HEENT: normal Neck: no JVD Vascular: No carotid bruits; Distal pulses 2+ bilaterally   Cardiac:  normal S1, S2; RRR; systolic murmur heard Lungs: Poor airflow bilaterally Abd: soft, nontender, no hepatomegaly  Ext: mild edema Musculoskeletal:  No deformities, BUE and BLE strength normal and equal Skin: warm and dry  Neuro:  CNs 2-12 intact, no focal abnormalities noted Psych:  Normal affect    EKG:  The ECG that was done  was personally reviewed and demonstrates ST depressions in anterior and lateral leads  Relevant CV Studies:  Echocardiogram 12/15/2021 showed EF 55 to 60%, low normal RV function, mild to moderate left atrial enlargement, small pericardial effusion, moderate aortic stenosis.   Lexiscan Myoview on 12/16/2021 showed no ischemia or infarction, EF 58%.    Echocardiogram 06/2022 showed EF 65  to 70%, normal RV function, moderate aortic stenosis (mean gradient 17 mmHg, V-max 2.9 m/s, VTI 1.2 cm, DI 0.37).   Coronary angiography 06/07/21: "normal left main with severe mid-LAD disease and patent LIMA graft to LAD but with slow flow in the distal graft with possible filling defect vs streaming; in either case, the distal vessel is very small, too small for PCI. There were patent stents in second obtuse marginal branch. Distal circumflex was chronically occluded with right to left collaterals. Sequential SVG to two OM branches was chronically occluded. RCA contained a widely patent stent in proximal portion".   Laboratory Data:  High Sensitivity Troponin:   Recent Labs  Lab 11/15/22 1816 11/15/22 1950  TROPONINIHS 27* 27*      Chemistry Recent Labs  Lab 11/10/22 1244 11/15/22 1816  NA 142 137  K 5.3* 4.0  CL 101 99  CO2 27 26  GLUCOSE 85 86  BUN 17 27*  CREATININE 1.76* 2.02*  CALCIUM 10.8* 10.4*  MG 1.8 1.9  GFRNONAA  --  34*   ANIONGAP  --  12    Recent Labs  Lab 11/15/22 1816  PROT 7.1  ALBUMIN 3.8  AST 29  ALT 30  ALKPHOS 65  BILITOT 0.7   Lipids No results for input(s): "CHOL", "TRIG", "HDL", "LABVLDL", "LDLCALC", "CHOLHDL" in the last 168 hours. Hematology Recent Labs  Lab 11/15/22 1816  WBC 7.8  RBC 5.13  HGB 14.8  HCT 45.8  MCV 89.3  MCH 28.8  MCHC 32.3  RDW 17.6*  PLT 228   Thyroid No results for input(s): "TSH", "FREET4" in the last 168 hours. BNP Recent Labs  Lab 11/15/22 1800  BNP 75.9    DDimer No results for input(s): "DDIMER" in the last 168 hours.   Radiology/Studies:  DG Chest Portable 1 View  Result Date: 11/15/2022 CLINICAL DATA:  Chest pain EXAM: PORTABLE CHEST 1 VIEW COMPARISON:  X-ray 09/13/2022 and older FINDINGS: Status post median sternotomy. Two of the upper wires are fractured. Left upper chest pacemaker. Underinflation. Blunting of the left costophrenic angle. Tiny effusion versus pleural thickening. No pneumothorax. Stable cardiopericardial silhouette with slight central vascular congestion. No consolidation. Degenerative changes of the right shoulder. Left shoulder arthroplasty with coracoid screw. Overlapping cardiac leads. IMPRESSION: Postop chest with underinflation. Tiny left effusion versus pleural thickening. Central vascular congestion. Pacemaker. Electronically Signed   By: Karen Kays M.D.   On: 11/15/2022 18:41     Assessment and Plan:   # CAD s/p CABG # Moderate AS # SOB/CP with minimally elevated troponin and new EKG changes # HFpEF # Afib # OSA # HLD # AKI  - Coronary angiography 06/07/21: "normal left main with severe mid-LAD disease and patent LIMA graft to LAD but with slow flow in the distal graft with possible filling defect vs streaming; in either case, the distal vessel is very small, too small for PCI. There were patent stents in second obtuse marginal branch. Distal circumflex was chronically occluded with right to left collaterals.  Sequential SVG to two OM branches was chronically occluded. RCA contained a widely patent stent in proximal portion".  -Had a negative stress test in 2023 May; Echo shows normal LVEF -Considering new symptoms + EKG changes, will plan for LHC in AM vs cardiac PET. However given his renal dysfunction, may hold off on cath unless high risk stress test -Heparin IV -Echo in AM -Continue aspirin and statin -Continue BB -Continue Imdur -Hold lisinopril -Continue lasix for HFpEF -Status post  [employer] PPM. Device follow up -Severe OSA on sleep study 12/2021, needs BiPAP but has not started.  -Hold apixaban for Afib- IV heparin while in house. Resume apixaban at discharge   For questions or updates, please contact [facility] Please consult [email or web address] for contact info under     Signed, Hermelinda Dellen, MD  11/15/2022 11:25 PM

## 2022-11-15 NOTE — ED Notes (Signed)
Patient given sand which  

## 2022-11-15 NOTE — ED Notes (Signed)
EDP at [facility] 

## 2022-11-15 NOTE — ED Triage Notes (Signed)
BIB [facility] from home for CP, onset Sudnay, persisted so called EMS. Seen by Cardiology this week, "concern for developing new blockage", mentions pacemaker deactivated. CABG 2015. "Feels the same as previous MI". CP radiates to neck. ASA  and 2 ntg SL PTA with some improvement. NSL 18g L FA. Depression in V2-V3-V4, posterior EKG did not show elevation. Prolonged QT. C/o nausea. Zofran not given. Alert, NAD, calm, interactive. EDP at [facility] upon arrival.

## 2022-11-15 NOTE — Telephone Encounter (Signed)
Pt states he'll go to the hospital but "I want to know how long I will be there before they start doing testing, I dont want to be there for a week"   Pt states he still having mild chest pain and he states "I haven't taken anything, I dont want to take anything"

## 2022-11-16 ENCOUNTER — Inpatient Hospital Stay (HOSPITAL_COMMUNITY): Payer: Medicare Other

## 2022-11-16 DIAGNOSIS — I480 Paroxysmal atrial fibrillation: Secondary | ICD-10-CM

## 2022-11-16 DIAGNOSIS — I10 Essential (primary) hypertension: Secondary | ICD-10-CM

## 2022-11-16 DIAGNOSIS — R9431 Abnormal electrocardiogram [ECG] [EKG]: Secondary | ICD-10-CM | POA: Diagnosis not present

## 2022-11-16 DIAGNOSIS — I214 Non-ST elevation (NSTEMI) myocardial infarction: Secondary | ICD-10-CM | POA: Diagnosis not present

## 2022-11-16 LAB — ECHOCARDIOGRAM COMPLETE
AR max vel: 0.94 cm2
AV Area VTI: 1.01 cm2
AV Area mean vel: 0.96 cm2
AV Mean grad: 23 mmHg
AV Peak grad: 41.2 mmHg
Ao pk vel: 3.21 m/s
Area-P 1/2: 2.14 cm2
Height: 67 in
MV M vel: 4.73 m/s
MV Peak grad: 89.5 mmHg
MV VTI: 2.8 cm2
S' Lateral: 3.7 cm
Weight: 3470.92 oz

## 2022-11-16 LAB — HEPARIN LEVEL (UNFRACTIONATED)
Heparin Unfractionated: >1.1 IU/mL — ABNORMAL HIGH (ref 0.30–0.70)
Heparin Unfractionated: >1.1 IU/mL — ABNORMAL HIGH (ref 0.30–0.70)

## 2022-11-16 LAB — BASIC METABOLIC PANEL
Anion gap: 14 (ref 5–15)
BUN: 29 mg/dL — ABNORMAL HIGH (ref 8–23)
CO2: 26 mmol/L (ref 22–32)
Calcium: 10.7 mg/dL — ABNORMAL HIGH (ref 8.9–10.3)
Chloride: 96 mmol/L — ABNORMAL LOW (ref 98–111)
Creatinine, Ser: 1.83 mg/dL — ABNORMAL HIGH (ref 0.61–1.24)
GFR, Estimated: 38 mL/min — ABNORMAL LOW (ref >60)
Glucose, Bld: 103 mg/dL — ABNORMAL HIGH (ref 70–99)
Potassium: 4.4 mmol/L (ref 3.5–5.1)
Sodium: 136 mmol/L (ref 135–145)

## 2022-11-16 LAB — TROPONIN I (HIGH SENSITIVITY)
Troponin I (High Sensitivity): 24 ng/L — ABNORMAL HIGH (ref <18)
Troponin I (High Sensitivity): 22 ng/L — ABNORMAL HIGH (ref <18)

## 2022-11-16 LAB — APTT
aPTT: 34 seconds (ref 24–36)
aPTT: 89 seconds — ABNORMAL HIGH (ref 24–36)
aPTT: 91 seconds — ABNORMAL HIGH (ref 24–36)

## 2022-11-16 MED ORDER — SODIUM CHLORIDE 0.9 % IV SOLN: INTRAVENOUS | Status: AC

## 2022-11-16 MED ORDER — PERFLUTREN LIPID MICROSPHERE
1.0000 mL | INTRAVENOUS | Status: AC | PRN
  Administered 2022-11-16: 3 mL via INTRAVENOUS
  Filled 2022-11-16: qty 10

## 2022-11-16 MED ORDER — SODIUM CHLORIDE 0.9 % IV SOLN: INTRAVENOUS | Status: DC

## 2022-11-16 MED ORDER — NITROGLYCERIN IN D5W 200-5 MCG/ML-% IV SOLN
0.0000 ug/min | INTRAVENOUS | Status: DC
  Administered 2022-11-16: 5 ug/min via INTRAVENOUS
  Filled 2022-11-16: qty 250

## 2022-11-16 NOTE — ED Notes (Signed)
Assisted patient with urinal.

## 2022-11-16 NOTE — Telephone Encounter (Signed)
Current admission 

## 2022-11-16 NOTE — Progress Notes (Signed)
ANTICOAGULATION CONSULT NOTE - Follow-Up Consult  Pharmacy Consult for Heparin infusion Indication: chest pain/ACS  Allergies  Allergen Reactions   Acacia Itching   Metformin And Related Other (See Comments)    Metformin caused the patient's legs to become weak    Patient Measurements: Height:  (170.2 cm) Weight: 92.9 kg (204 lb 12.9 oz) IBW/kg (Calculated) : 66.1 Heparin Dosing Weight: 87.4 kg  Vital Signs: Temp: 97.6 F (36.4 C) (04/17 1556) Temp Source: Oral (04/17 1556) BP: 132/95 (04/17 1556) Pulse Rate: 59 (04/17 1556)  Labs: Recent Labs    11/15/22 1816 11/15/22 1950 11/15/22 2330 11/16/22 0744 11/16/22 1124 11/16/22 1435 11/16/22 1654  HGB 14.8  --   --   --   --   --   --   HCT 45.8  --   --   --   --   --   --   PLT 228  --   --   --   --   --   --   APTT  --   --  34 91*  --   --  89*  LABPROT 16.6*  --   --   --   --   --   --   INR 1.4*  --   --   --   --   --   --   HEPARINUNFRC  --   --  >1.10* >1.10*  --   --   --   CREATININE 2.02*  --   --   --   --  1.83*  --   TROPONINIHS 27* 27*  --   --  24* 22*  --      Estimated Creatinine Clearance: 37.3 mL/min (A) (by C-G formula based on SCr of 1.83 mg/dL (H)).   Medical History: Past Medical History:  Diagnosis Date   Atrial fibrillation    Deafness in left ear    Hypertension    Symptomatic bradycardia 04/2021   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[place], [place]    Assessment: 40 yom with a history of T2DM, CKD, HLD, AF, OSA, CAD, PAD, HF. Patient is presenting with chest pain. Heparin per pharmacy consult placed for chest pain/ACS.  Patient is on apixaban prior to arrival. Last dose 4/16 ~1000 per patient. Will require aPTT monitoring due to likely falsely high anti-Xa level secondary to DOAC use.  aPTT 89, therapeutic Current heparin infusion rate: 1200 units/hr  Goal of Therapy:  Heparin level 0.3-0.7 units/ml aPTT 66-102 seconds Monitor platelets by anticoagulation protocol: Yes   Plan:  Continue  heparin infusion at 1200 units/hr Check aPTT / HL  daily while on heparin Continue to monitor via aPTT until levels are correlated Monitor daily CBC, aPTT and heparin level, and for s/sx of bleeding F/u anticoagulation plan s/p cath    Calton Dach, PharmD Clinical Pharmacist 11/16/2022 6:45 PM

## 2022-11-16 NOTE — Progress Notes (Signed)
  Echocardiogram 2D Echocardiogram has been performed.  Milda Smart 11/16/2022, 3:25 PM

## 2022-11-16 NOTE — ED Notes (Signed)
Pt had chest pain 9/10. Gave nitro SL down to a 6/10. Contacted cardiology. They stated to try one more nitro, this brought the pain down a little more but he states it is still there. Cardiology mentioned maybe a nitro drip, but no order was seen. Paged cardiology again to see if they still wanted to do that.

## 2022-11-16 NOTE — H&P (View-Only) (Signed)
 Rounding Note    Patient Name: Johnny [name] Date of Encounter: 11/16/2022  [facility] Cardiologist: Christopher L Schumann, MD   Subjective   Patient appears to be having pain, reports 8/10 chest pressure with radiation down left arm. Improved to 6/10 with up-titration of nitroglycerin. Says that chest pain was improved overnight, as low as 3/10. He endorses associated dyspnea and nausea. No palpitations, dizziness.  Inpatient Medications    Scheduled Meds:  aspirin EC  81 mg Oral Daily   atorvastatin  80 mg Oral Daily   carvedilol  6.25 mg Oral BID   cyanocobalamin  1,000 mcg Oral Daily   famotidine  40 mg Oral Daily   furosemide  40 mg Oral QODAY   loratadine  10 mg Oral Daily   pantoprazole  40 mg Oral QAC breakfast   venlafaxine XR  150 mg Oral Q breakfast   Continuous Infusions:  heparin 1,200 Units/hr (11/16/22 0712)   nitroGLYCERIN 1.5 mcg/min (11/16/22 0712)   PRN Meds: acetaminophen, albuterol, fluticasone, montelukast, nitroGLYCERIN, ondansetron (ZOFRAN) IV, sodium chloride   Vital Signs    Vitals:   11/16/22 0830 11/16/22 0840 11/16/22 0915 11/16/22 0917  BP: (!) 120/95  116/80   Pulse:   (!) 59   Resp: 17 15 16   Temp:    (!) 97.4 F (36.3 C)  TempSrc:    Oral  SpO2: 92% 94%    Weight:      Height:        Intake/Output Summary (Last 24 hours) at 11/16/2022 1044 Last data filed at 11/15/2022 2041 Gross per 24 hour  Intake 50 ml  Output --  Net 50 ml      11/15/2022   11:18 PM 11/15/2022    5:47 PM 11/10/2022   10:48 AM  Last 3 Weights  Weight (lbs) 216 lb 14.9 oz 217 lb 217 lb 9.6 oz  Weight (kg) 98.4 kg 98.431 kg 98.703 kg      Telemetry    Atrial paced rhythm - Personally Reviewed  ECG    Atrial paced rhythm with new mild precordial lead ST depression - Personally Reviewed  Physical Exam   GEN: Patient uncomfortable appearing Neck: No JVD Cardiac: RRR, rubbing systolic murmur Respiratory: Clear to auscultation  bilaterally. GI: Soft, nontender, non-distended  MS: No edema; No deformity. Neuro:  Nonfocal  Psych: Normal affect   Labs    High Sensitivity Troponin:   Recent Labs  Lab 11/15/22 1816 11/15/22 1950  TROPONINIHS 27* 27*     Chemistry Recent Labs  Lab 11/10/22 1244 11/15/22 1816  NA 142 137  K 5.3* 4.0  CL 101 99  CO2 27 26  GLUCOSE 85 86  BUN 17 27*  CREATININE 1.76* 2.02*  CALCIUM 10.8* 10.4*  MG 1.8 1.9  PROT  --  7.1  ALBUMIN  --  3.8  AST  --  29  ALT  --  30  ALKPHOS  --  65  BILITOT  --  0.7  GFRNONAA  --  34*  ANIONGAP  --  12    Lipids No results for input(s): "CHOL", "TRIG", "HDL", "LABVLDL", "LDLCALC", "CHOLHDL" in the last 168 hours.  Hematology Recent Labs  Lab 11/15/22 1816  WBC 7.8  RBC 5.13  HGB 14.8  HCT 45.8  MCV 89.3  MCH 28.8  MCHC 32.3  RDW 17.6*  PLT 228   Thyroid No results for input(s): "TSH", "FREET4" in the last 168 hours.  BNP Recent Labs    Lab 11/15/22 1800  BNP 75.9    DDimer No results for input(s): "DDIMER" in the last 168 hours.   Radiology    DG Chest Portable 1 View  Result Date: 11/15/2022 CLINICAL DATA:  Chest pain EXAM: PORTABLE CHEST 1 VIEW COMPARISON:  X-ray 09/13/2022 and older FINDINGS: Status post median sternotomy. Two of the upper wires are fractured. Left upper chest pacemaker. Underinflation. Blunting of the left costophrenic angle. Tiny effusion versus pleural thickening. No pneumothorax. Stable cardiopericardial silhouette with slight central vascular congestion. No consolidation. Degenerative changes of the right shoulder. Left shoulder arthroplasty with coracoid screw. Overlapping cardiac leads. IMPRESSION: Postop chest with underinflation. Tiny left effusion versus pleural thickening. Central vascular congestion. Pacemaker. Electronically Signed   By: Ashok  Gupta M.D.   On: 11/15/2022 18:41    Cardiac Studies   06/19/22 TTE  IMPRESSIONS     1. Left ventricular ejection fraction, by  estimation, is 65 to 70%. The  left ventricle has normal function. The left ventricle has no regional  wall motion abnormalities. Left ventricular diastolic parameters are  indeterminate.   2. Right ventricular systolic function is normal. The right ventricular  size is normal. Tricuspid regurgitation signal is inadequate for assessing  PA pressure.   3. The mitral valve is normal in structure. Trivial mitral valve  regurgitation. No evidence of mitral stenosis. Moderate mitral annular  calcification.   4. The aortic valve is tricuspid. There is moderate calcification of the  aortic valve. There is moderate thickening of the aortic valve. Aortic  valve regurgitation is not visualized. Moderate aortic valve stenosis.  Aortic valve area, by VTI measures  1.17 cm. Aortic valve Vmax measures 2.87 m/s. Aortic valve mean gradient  measures 17.0 mmHg. Aortic valve peak gradient measures 32.9      Although the mean AVG and peak velocity are in the mild range, the DVI  is low at 0.37 and SVI is low at 24. 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   5. There is borderline dilatation of the ascending aorta, measuring 37  mm.   6. The inferior vena cava is normal in size with greater than 50%  respiratory variability, suggesting right atrial pressure of 3 mmHg.   7. Compared to prior study dated 12/15/2021, mean AVG has decreased from  22.4mmHg to 17mmHg but AVA has decreased from 1.23cm2 to 1.17cm2, SVI has  dropped from 32 to 24 but DVI is the same at 0.37. Overall no significant  change in degree of AS.   FINDINGS   Left Ventricle: Left ventricular ejection fraction, by estimation, is 65  to 70%. The left ventricle has normal function. The left ventricle has no  regional wall motion abnormalities. Definity contrast agent was given IV  to delineate the left ventricular   endocardial borders. The left ventricular internal cavity size was normal  in size. There is no  concentric left ventricular hypertrophy. Left  ventricular diastolic parameters are indeterminate. Normal left  ventricular filling pressure.   Right Ventricle: The right ventricular size is normal. No increase in  right ventricular wall thickness. Right ventricular systolic function is  normal. Tricuspid regurgitation signal is inadequate for assessing PA  pressure.   Left Atrium: Left atrial size was normal in size.   Right Atrium: Right atrial size was normal in size.   Pericardium: There is no evidence of pericardial effusion.   Mitral Valve: The mitral valve is normal in structure. Moderate mitral  annular calcification. Trivial mitral   valve regurgitation. No evidence of  mitral valve stenosis.   Tricuspid Valve: The tricuspid valve is normal in structure. Tricuspid  valve regurgitation is not demonstrated. No evidence of tricuspid  stenosis.   Aortic Valve: The aortic valve is tricuspid. There is moderate  calcification of the aortic valve. There is moderate thickening of the  aortic valve. Aortic valve regurgitation is not visualized. Moderate  aortic stenosis is present. Aortic valve mean  gradient measures 17.0 mmHg. Aortic valve peak gradient measures 32.9  mmHg. Aortic valve area, by VTI measures 1.17 cm.   Pulmonic Valve: The pulmonic valve was normal in structure. Pulmonic valve  regurgitation is not visualized. No evidence of pulmonic stenosis.   Aorta: The aortic root is normal in size and structure. There is  borderline dilatation of the ascending aorta, measuring 37 mm.   Venous: The inferior vena cava is normal in size with greater than 50%  respiratory variability, suggesting right atrial pressure of 3 mmHg.   IAS/Shunts: No atrial level shunt detected by color flow Doppler.   Patient Profile     Avon Twombly is a 76 y.o. male with a hx of CAD status post CABG in 2015, moderate aortic stenosis, diastolic heart failure, OSA, atrial fibrillation,  symptomatic bradycardia status post PPM. He was seen in office last week for persistent SOB and plan was to do PET stress. However symptoms worsened with progressing chest pain and patient presented to the ED for further evaluation.  Assessment & Plan    CAD Unstable angina NSTEMI  Patient s/p CABG in 2015. Coronary angiography 06/07/21: "normal left main with severe mid-LAD disease and patent LIMA graft to LAD but with slow flow in the distal graft with possible filling defect vs streaming; in either case, the distal vessel is very small, too small for PCI. There were patent stents in second obtuse marginal branch. Distal circumflex was chronically occluded with right to left collaterals. Sequential SVG to two OM branches was chronically occluded. RCA contained a widely patent stent in proximal portion". Echocardiogram 12/15/2021 showed EF 55 to 60%, low normal RV function, mild to moderate left atrial enlargement, small pericardial effusion, moderate aortic stenosis. Lexiscan Myoview on 12/16/2021 showed no ischemia or infarction, EF 58%.  Patient with worsening chest pain prompting ordering of outpatient cardiac PET, now admitted with accelerating angina. Troponin 27->27.  Chest pain recurring this AM, 8/10. Will increase nitroglycerin infusion. ECG without significant changes from admission tracing. Cycle troponin again this morning. Given worsened pain this AM, will need to have LHC today. Limit contrast use with creatinine 2.02 (*recent baseline closer to 1.6-1.7). Continue heparin, ASA, statin, Imdur, Coreg  Shared Decision Making/Informed Consent The risks [stroke (1 in 1000), death (1 in 1000), kidney failure [usually temporary] (1 in 500), bleeding (1 in 200), allergic reaction [possibly serious] (1 in 200)], benefits (diagnostic support and management of coronary artery disease) and alternatives of a cardiac catheterization were discussed in detail with Mr. Reinitz and he is willing to  proceed.   Aortic stenosis Chronic diastolic CHF  Moderate AS on echocardiogram 12/15/2021.  Repeat echo during hospitalization 06/2022 showed EF 65 to 70%, normal RV function, moderate aortic stenosis (mean gradient 17 mmHg, V-max 2.9 m/s, VTI 1.2 cm, DI 0.37).   Patient euvolemic on physical exam today. In light of creatinine elevated above baseline, will hold further lasix Repeat TTE this admission  Atrial fibrillation  A paced rhythm today. Continue Coreg. Hold Eliquis and bridge with heparin pending LHC.    Hypertension  BP stable at this time. Will continue to hold Lisinopril to allow for titration of nitro for chest pain. Continue Imdur and Coreg.  AKI on CKD  Creatinine elevated to 2.02 this morning. Baseline closer to 1.6. Will need careful monitoring of renal function following LHC.   Hyperlipidemia  Continue Crestor 40mg QD.    For questions or updates, please contact [facility] Please consult [email or web address] for contact info under        Signed, Sheldon Sem, PA-C  11/16/2022, 10:44 AM    

## 2022-11-16 NOTE — ED Notes (Signed)
Labs sent on patient. Patient alert oriented,cooperative. Vitals stable. No distress. Call light in reach, bed in lowest position and locked. Updated on care plan.

## 2022-11-16 NOTE — ED Notes (Signed)
..ED TO INPATIENT HANDOFF REPORT  ED Nurse Name and Phone #: [phone]  S Name/Age/Gender Johnny [name] 77 y.o. male Room/Bed: 004C/004C  Code Status   Code Status: Full Code  Home/SNF/Other Home Patient oriented to: self, place, time, and situation Is this baseline? Yes   Triage Complete: Triage complete  Chief Complaint NSTEMI (non-ST elevated myocardial infarction) [I21.4]  Triage Note BIB [facility] from home for CP, onset Sudnay, persisted so called EMS. Seen by Cardiology this week, "concern for developing new blockage", mentions pacemaker deactivated. CABG 2015. "Feels the same as previous MI". CP radiates to neck. ASA 324mg  and 2 ntg SL PTA with some improvement. NSL 18g L FA. Depression in V2-V3-V4, posterior EKG did not show elevation. Prolonged QT. C/o nausea. Zofran not given. Alert, NAD, calm, interactive. EDP at [facility] upon arrival.     Allergies Allergies  Allergen Reactions   Acacia Itching   Metformin And Related Other (See Comments)    Metformin caused the patient's legs to become weak    Level of Care/Admitting Diagnosis ED Disposition     ED Disposition  Admit   Condition  --   Comment  Hospital Area: [facility] [[id number]]  Level of Care: Progressive [102]  Admit to Progressive based on following criteria: CARDIOVASCULAR & THORACIC of moderate stability with acute coronary syndrome symptoms/low risk myocardial infarction/hypertensive urgency/arrhythmias/heart failure potentially compromising stability and stable post cardiovascular intervention patients.  May admit patient to [facility] or [facility] if equivalent level of care is available:: No  Covid Evaluation: Asymptomatic - no recent exposure (last 10 days) testing not required  Diagnosis: NSTEMI (non-ST elevated myocardial infarction) [409811]  Admitting Physician: Hermelinda Dellen [[id number]]  Attending Physician: Dolores Patty [[id number]]  Certification:: I certify this patient will need  inpatient services for at least 2 midnights  Estimated Length of Stay: 2          B Medical/Surgery History Past Medical History:  Diagnosis Date   Atrial fibrillation    Deafness in left ear    Hypertension    Symptomatic bradycardia 04/2021   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[place], [place]   Past Surgical History:  Procedure Laterality Date   BACK SURGERY  1975   Lower Back Surgery, Dr.Seltzer   BACK SURGERY  1985   Dr.Seltzer   SHOULDER SURGERY Left 1965   Dr.Seltzer   TONSILLECTOMY  1950   Dr.Perellie     A IV Location/Drains/Wounds Patient Lines/Drains/Airways Status     Active Line/Drains/Airways     Name Placement date Placement time Site Days   Peripheral IV 11/15/22 18 G 1" Left Forearm 11/15/22  1741  Forearm  1   Peripheral IV 11/15/22 20 G Anterior;Right Forearm 11/15/22  2340  Forearm  1            Intake/Output Last 24 hours  Intake/Output Summary (Last 24 hours) at 11/16/2022 1437 Last data filed at 11/15/2022 2041 Gross per 24 hour  Intake 50 ml  Output --  Net 50 ml    Labs/Imaging Results for orders placed or performed during the hospital encounter of 11/15/22 (from the past 48 hour(s))  Brain natriuretic peptide (order if patient c/o SOB ONLY)     Status: None   Collection Time: 11/15/22  6:00 PM  Result Value Ref Range   B Natriuretic Peptide 75.9 0.0 - 100.0 pg/mL    Comment: Performed at [facility], [address], [place], [place] [address]  CBG monitoring,  ED     Status: None   Collection Time: 11/15/22  6:06 PM  Result Value Ref Range   Glucose-Capillary 94 70 - 99 mg/dL    Comment: Glucose reference range applies only to samples taken after fasting for at least 8 hours.  Basic metabolic panel     Status: Abnormal   Collection Time: 11/15/22  6:16 PM  Result Value Ref Range   Sodium 137 135 - 145 mmol/L   Potassium 4.0 3.5 - 5.1 mmol/L   Chloride 99 98 - 111 mmol/L   CO2 26 22 - 32 mmol/L   Glucose, Bld 86 70 - 99 mg/dL    Comment:  Glucose reference range applies only to samples taken after fasting for at least 8 hours.   BUN 27 (H) 8 - 23 mg/dL   Creatinine, Ser 6.04 (H) 0.61 - 1.24 mg/dL   Calcium 54.0 (H) 8.9 - 10.3 mg/dL   GFR, Estimated 34 (L) >60 mL/min    Comment: (NOTE) Calculated using the CKD-EPI Creatinine Equation (2021)    Anion gap 12 5 - 15    Comment: Performed at [facility], [address] [place], [place] [address]  Troponin I (High Sensitivity)     Status: Abnormal   Collection Time: 11/15/22  6:16 PM  Result Value Ref Range   Troponin I (High Sensitivity) 27 (H) <18 ng/L    Comment: (NOTE) Elevated high sensitivity troponin I (hsTnI) values and significant  changes across serial measurements may suggest ACS but many other  chronic and acute conditions are known to elevate hsTnI results.  Refer to the "Links" section for chest pain algorithms and additional  guidance. Performed at [facility], [address], [place], [place] [address]   Magnesium     Status: None   Collection Time: 11/15/22  6:16 PM  Result Value Ref Range   Magnesium 1.9 1.7 - 2.4 mg/dL    Comment: Performed at [facility], [address] [place], [place] [address]  Lipase, blood     Status: None   Collection Time: 11/15/22  6:16 PM  Result Value Ref Range   Lipase 37 11 - 51 U/L    Comment: Performed at [facility], [address] [place], [place] [address]  Hepatic function panel     Status: None   Collection Time: 11/15/22  6:16 PM  Result Value Ref Range   Total Protein 7.1 6.5 - 8.1 g/dL   Albumin 3.8 3.5 - 5.0 g/dL   AST 29 15 - 41 U/L   ALT 30 0 - 44 U/L   Alkaline Phosphatase 65 38 - 126 U/L   Total Bilirubin 0.7 0.3 - 1.2 mg/dL   Bilirubin, Direct <7.8 0.0 - 0.2 mg/dL   Indirect Bilirubin NOT CALCULATED 0.3 - 0.9 mg/dL    Comment: Performed at [facility], [address], [place], [place] [address]  CBC with Differential/Platelet     Status: Abnormal   Collection  Time: 11/15/22  6:16 PM  Result Value Ref Range   WBC 7.8 4.0 - 10.5 K/uL   RBC 5.13 4.22 - 5.81 MIL/uL   Hemoglobin 14.8 13.0 - 17.0 g/dL   HCT 95.2 84.1 - 32.4 %   MCV 89.3 80.0 - 100.0 fL   MCH 28.8 26.0 - 34.0 pg   MCHC 32.3 30.0 - 36.0 g/dL   RDW 40.1 (H) 02.7 - 25.3 %   Platelets 228 150 -  400 K/uL   nRBC 0.0 0.0 - 0.2 %   Neutrophils Relative % 66 %   Neutro Abs 5.2 1.7 - 7.7 K/uL   Lymphocytes Relative 20 %   Lymphs Abs 1.5 0.7 - 4.0 K/uL   Monocytes Relative 11 %   Monocytes Absolute 0.9 0.1 - 1.0 K/uL   Eosinophils Relative 2 %   Eosinophils Absolute 0.1 0.0 - 0.5 K/uL   Basophils Relative 1 %   Basophils Absolute 0.0 0.0 - 0.1 K/uL   Immature Granulocytes 0 %   Abs Immature Granulocytes 0.03 0.00 - 0.07 K/uL    Comment: Performed at [facility], [address], [place], [place] [address]  Protime-INR     Status: Abnormal   Collection Time: 11/15/22  6:16 PM  Result Value Ref Range   Prothrombin Time 16.6 (H) 11.4 - 15.2 seconds   INR 1.4 (H) 0.8 - 1.2    Comment: (NOTE) INR goal varies based on device and disease states. Performed at [facility], [address], [place], [place] [address]   Troponin I (High Sensitivity)     Status: Abnormal   Collection Time: 11/15/22  7:50 PM  Result Value Ref Range   Troponin I (High Sensitivity) 27 (H) <18 ng/L    Comment: (NOTE) Elevated high sensitivity troponin I (hsTnI) values and significant  changes across serial measurements may suggest ACS but many other  chronic and acute conditions are known to elevate hsTnI results.  Refer to the "Links" section for chest pain algorithms and additional  guidance. Performed at [facility], [address] [place], [place] [address]   APTT     Status: None   Collection Time: 11/15/22 11:30 PM  Result Value Ref Range   aPTT 34 24 - 36 seconds    Comment: Performed at [facility], [address] [place], [place] [address]  Heparin level  (unfractionated)     Status: Abnormal   Collection Time: 11/15/22 11:30 PM  Result Value Ref Range   Heparin Unfractionated >1.10 (H) 0.30 - 0.70 IU/mL    Comment: (NOTE) The clinical reportable range upper limit is being lowered to >1.10 to align with the FDA approved guidance for the current laboratory assay.  If heparin results are below expected values, and patient dosage has  been confirmed, suggest follow up testing of antithrombin III levels. Performed at [facility], [address] [place], [place] [address]   APTT     Status: Abnormal   Collection Time: 11/16/22  7:44 AM  Result Value Ref Range   aPTT 91 (H) 24 - 36 seconds    Comment:        IF BASELINE aPTT IS ELEVATED, SUGGEST PATIENT RISK ASSESSMENT BE USED TO DETERMINE APPROPRIATE ANTICOAGULANT THERAPY. Performed at [facility], [address], [place], [place] [address]   Heparin level (unfractionated)     Status: Abnormal   Collection Time: 11/16/22  7:44 AM  Result Value Ref Range   Heparin Unfractionated >1.10 (H) 0.30 - 0.70 IU/mL    Comment: (NOTE) The clinical reportable range upper limit is being lowered to >1.10 to align with the FDA approved guidance for the current laboratory assay.  If heparin results are below expected values, and patient dosage has  been confirmed, suggest follow up testing of antithrombin III levels. Performed at [facility], [address], [place], [place] [address]   Troponin I (High  Sensitivity)     Status: Abnormal   Collection Time: 11/16/22 11:24 AM  Result Value Ref Range   Troponin I (High Sensitivity) 24 (H) <18 ng/L    Comment: (NOTE) Elevated high sensitivity troponin I (hsTnI) values and significant  changes across serial measurements may suggest ACS but many other  chronic and acute conditions are known to elevate hsTnI results.  Refer to the "Links" section for chest pain algorithms and additional  guidance. Performed at [facility], [address], [place], [place] [address]    DG Chest Portable 1 View  Result Date: 11/15/2022 CLINICAL DATA:  Chest pain EXAM: PORTABLE CHEST 1 VIEW COMPARISON:  X-ray 09/13/2022 and older FINDINGS: Status post median sternotomy. Two of the upper wires are fractured. Left upper chest pacemaker. Underinflation. Blunting of the left costophrenic angle. Tiny effusion versus pleural thickening. No pneumothorax. Stable cardiopericardial silhouette with slight central vascular congestion. No consolidation. Degenerative changes of the right shoulder. Left shoulder arthroplasty with coracoid screw. Overlapping cardiac leads. IMPRESSION: Postop chest with underinflation. Tiny left effusion versus pleural thickening. Central vascular congestion. Pacemaker. Electronically Signed   By: Karen Kays M.D.   On: 11/15/2022 18:41    Pending Labs Unresulted Labs (From admission, onward)     Start     Ordered   11/17/22 0500  APTT  Daily,   R      11/15/22 2324   11/17/22 0500  Heparin level (unfractionated)  Daily,   R      11/15/22 2324   11/17/22 0500  CBC  Daily,   R      11/16/22 1140   11/16/22 1600  APTT  Once-Timed,   TIMED        11/16/22 1140   11/16/22 1416  Basic metabolic panel  ONCE - STAT,   STAT        11/16/22 1416   11/16/22 0500  Lipoprotein A (LPA)  Tomorrow morning,   R        11/15/22 2317            Vitals/Pain Today's Vitals   11/16/22 1245 11/16/22 1300 11/16/22 1315 11/16/22 1330  BP: 129/87  122/86   Pulse: 60 62 62 62  Resp: 18 16 15 14   Temp:      TempSrc:      SpO2: 95% 94% 98% 96%  Weight:      Height:      PainSc:        Isolation Precautions No active isolations  Medications Medications  aspirin EC tablet 81 mg (81 mg Oral Given 11/16/22 1005)  nitroGLYCERIN (NITROSTAT) SL tablet 0.4 mg (0.4 mg Sublingual Given 11/16/22 0146)  acetaminophen (TYLENOL) tablet 650 mg (has no administration in time range)  ondansetron (ZOFRAN) injection 4 mg  (has no administration in time range)  atorvastatin (LIPITOR) tablet 80 mg (80 mg Oral Given 11/16/22 1003)  carvedilol (COREG) tablet 6.25 mg (6.25 mg Oral Given 11/16/22 1004)  venlafaxine XR (EFFEXOR-XR) 24 hr capsule 150 mg (150 mg Oral Given 11/16/22 1005)  famotidine (PEPCID) tablet 40 mg (40 mg Oral Given 11/16/22 1005)  pantoprazole (PROTONIX) EC tablet 40 mg (40 mg Oral Given 11/16/22 1005)  cyanocobalamin (VITAMIN B12) tablet 1,000 mcg (1,000 mcg Oral Given 11/16/22 1005)  fluticasone (FLONASE) 50 MCG/ACT nasal spray 2 spray (has no administration in time range)  loratadine (CLARITIN) tablet 10 mg (10 mg Oral Given 11/16/22 1005)  montelukast (SINGULAIR) tablet 10 mg (has no administration  in time range)  sodium chloride (OCEAN) 0.65 % nasal spray 1 spray (has no administration in time range)  heparin ADULT infusion 100 units/mL (25000 units/211mL) (1,200 Units/hr Intravenous Rate/Dose Verify 11/16/22 0712)  albuterol (PROVENTIL) (2.5 MG/3ML) 0.083% nebulizer solution 2.5 mg (has no administration in time range)  nitroGLYCERIN 50 mg in dextrose 5 % 250 mL (0.2 mg/mL) infusion (8 mcg/min Intravenous Rate/Dose Change 11/16/22 1103)  0.9 %  sodium chloride infusion (has no administration in time range)  nitroGLYCERIN (NITROGLYN) 2 % ointment 1 inch (1 inch Topical Given 11/15/22 1803)  ondansetron (ZOFRAN) injection 4 mg (4 mg Intravenous Given 11/15/22 1803)  fentaNYL (SUBLIMAZE) injection 50 mcg (50 mcg Intravenous Given 11/15/22 1803)  alum & mag hydroxide-simeth (MAALOX/MYLANTA) 200-200-20 MG/5ML suspension 30 mL (30 mLs Oral Given 11/15/22 2005)    And  lidocaine (XYLOCAINE) 2 % viscous mouth solution 15 mL (15 mLs Oral Given 11/15/22 2005)  famotidine (PEPCID) IVPB 20 mg premix (0 mg Intravenous Stopped 11/15/22 2041)  fentaNYL (SUBLIMAZE) injection 50 mcg (50 mcg Intravenous Given 11/15/22 2135)    Mobility walks     Focused Assessments Cardiac Assessment Handoff:  Cardiac Rhythm:  Normal sinus rhythm No results found for: "CKTOTAL", "CKMB", "CKMBINDEX", "TROPONINI" No results found for: "DDIMER" Does the Patient currently have chest pain? No   , Neuro Assessment Handoff:  Swallow screen pass? Yes  Cardiac Rhythm: Normal sinus rhythm       Neuro Assessment: Within Defined Limits Neuro Checks:      Has TPA been given? No If patient is a Neuro Trauma and patient is going to OR before floor call report to 4N Charge nurse: [phone] or [phone]   R Recommendations: See Admitting Provider Note  Report given to:   Additional Notes: na

## 2022-11-16 NOTE — Progress Notes (Signed)
Rounding Note    Patient Name: Johnny [name] Date of Encounter: 11/16/2022  [facility] Cardiologist: Little Ishikawa, MD   Subjective   Patient appears to be having pain, reports 8/10 chest pressure with radiation down left arm. Improved to 6/10 with up-titration of nitroglycerin. Says that chest pain was improved overnight, as low as 3/10. He endorses associated dyspnea and nausea. No palpitations, dizziness.  Inpatient Medications    Scheduled Meds:  aspirin EC  81 mg Oral Daily   atorvastatin  80 mg Oral Daily   carvedilol  6.25 mg Oral BID   cyanocobalamin  1,000 mcg Oral Daily   famotidine  40 mg Oral Daily   furosemide  40 mg Oral QODAY   loratadine  10 mg Oral Daily   pantoprazole  40 mg Oral QAC breakfast   venlafaxine XR  150 mg Oral Q breakfast   Continuous Infusions:  heparin 1,200 Units/hr (11/16/22 1610)   nitroGLYCERIN 1.5 mcg/min (11/16/22 0712)   PRN Meds: acetaminophen, albuterol, fluticasone, montelukast, nitroGLYCERIN, ondansetron (ZOFRAN) IV, sodium chloride   Vital Signs    Vitals:   11/16/22 0830 11/16/22 0840 11/16/22 0915 11/16/22 0917  BP: (!) 120/95  116/80   Pulse:   (!) 59   Resp: 17 15 16    Temp:    (!) 97.4 F (36.3 C)  TempSrc:    Oral  SpO2: 92% 94%    Weight:      Height:        Intake/Output Summary (Last 24 hours) at 11/16/2022 1044 Last data filed at 11/15/2022 2041 Gross per 24 hour  Intake 50 ml  Output --  Net 50 ml      11/15/2022   11:18 PM 11/15/2022    5:47 PM 11/10/2022   10:48 AM  Last 3 Weights  Weight (lbs) 216 lb 14.9 oz 217 lb 217 lb 9.6 oz  Weight (kg) 98.4 kg 98.431 kg 98.703 kg      Telemetry    Atrial paced rhythm - Personally Reviewed  ECG    Atrial paced rhythm with new mild precordial lead ST depression - Personally Reviewed  Physical Exam   GEN: Patient uncomfortable appearing Neck: No JVD Cardiac: RRR, rubbing systolic murmur Respiratory: Clear to auscultation  bilaterally. GI: Soft, nontender, non-distended  MS: No edema; No deformity. Neuro:  Nonfocal  Psych: Normal affect   Labs    High Sensitivity Troponin:   Recent Labs  Lab 11/15/22 1816 11/15/22 1950  TROPONINIHS 27* 27*     Chemistry Recent Labs  Lab 11/10/22 1244 11/15/22 1816  NA 142 137  K 5.3* 4.0  CL 101 99  CO2 27 26  GLUCOSE 85 86  BUN 17 27*  CREATININE 1.76* 2.02*  CALCIUM 10.8* 10.4*  MG 1.8 1.9  PROT  --  7.1  ALBUMIN  --  3.8  AST  --  29  ALT  --  30  ALKPHOS  --  65  BILITOT  --  0.7  GFRNONAA  --  34*  ANIONGAP  --  12    Lipids No results for input(s): "CHOL", "TRIG", "HDL", "LABVLDL", "LDLCALC", "CHOLHDL" in the last 168 hours.  Hematology Recent Labs  Lab 11/15/22 1816  WBC 7.8  RBC 5.13  HGB 14.8  HCT 45.8  MCV 89.3  MCH 28.8  MCHC 32.3  RDW 17.6*  PLT 228   Thyroid No results for input(s): "TSH", "FREET4" in the last 168 hours.  BNP Recent Labs  Lab 11/15/22 1800  BNP 75.9    DDimer No results for input(s): "DDIMER" in the last 168 hours.   Radiology    DG Chest Portable 1 View  Result Date: 11/15/2022 CLINICAL DATA:  Chest pain EXAM: PORTABLE CHEST 1 VIEW COMPARISON:  X-ray 09/13/2022 and older FINDINGS: Status post median sternotomy. Two of the upper wires are fractured. Left upper chest pacemaker. Underinflation. Blunting of the left costophrenic angle. Tiny effusion versus pleural thickening. No pneumothorax. Stable cardiopericardial silhouette with slight central vascular congestion. No consolidation. Degenerative changes of the right shoulder. Left shoulder arthroplasty with coracoid screw. Overlapping cardiac leads. IMPRESSION: Postop chest with underinflation. Tiny left effusion versus pleural thickening. Central vascular congestion. Pacemaker. Electronically Signed   By: Karen Kays M.D.   On: 11/15/2022 18:41    Cardiac Studies   06/19/22 TTE  IMPRESSIONS     1. Left ventricular ejection fraction, by  estimation, is 65 to 70%. The  left ventricle has normal function. The left ventricle has no regional  wall motion abnormalities. Left ventricular diastolic parameters are  indeterminate.   2. Right ventricular systolic function is normal. The right ventricular  size is normal. Tricuspid regurgitation signal is inadequate for assessing  PA pressure.   3. The mitral valve is normal in structure. Trivial mitral valve  regurgitation. No evidence of mitral stenosis. Moderate mitral annular  calcification.   4. The aortic valve is tricuspid. There is moderate calcification of the  aortic valve. There is moderate thickening of the aortic valve. Aortic  valve regurgitation is not visualized. Moderate aortic valve stenosis.  Aortic valve area, by VTI measures  1.17 cm. Aortic valve Vmax measures 2.87 m/s. Aortic valve mean gradient  measures 17.0 mmHg. Aortic valve peak gradient measures 32.9      Although the mean AVG and peak velocity are in the mild range, the DVI  is low at 0.37 and SVI is low at 24. 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   5. There is borderline dilatation of the ascending aorta, measuring 37  mm.   6. The inferior vena cava is normal in size with greater than 50%  respiratory variability, suggesting right atrial pressure of 3 mmHg.   7. Compared to prior study dated 12/15/2021, mean AVG has decreased from  22.55mmHg to but AVA has decreased from 1.23cm2 to 1.17cm2, SVI has  dropped from 32 to 24 but DVI is the same at 0.37. Overall no significant  change in degree of AS.   FINDINGS   Left Ventricle: Left ventricular ejection fraction, by estimation, is 65  to 70%. The left ventricle has normal function. The left ventricle has no  regional wall motion abnormalities. Definity contrast agent was given IV  to delineate the left ventricular   endocardial borders. The left ventricular internal cavity size was normal  in size. There is no  concentric left ventricular hypertrophy. Left  ventricular diastolic parameters are indeterminate. Normal left  ventricular filling pressure.   Right Ventricle: The right ventricular size is normal. No increase in  right ventricular wall thickness. Right ventricular systolic function is  normal. Tricuspid regurgitation signal is inadequate for assessing PA  pressure.   Left Atrium: Left atrial size was normal in size.   Right Atrium: Right atrial size was normal in size.   Pericardium: There is no evidence of pericardial effusion.   Mitral Valve: The mitral valve is normal in structure. Moderate mitral  annular calcification. Trivial mitral  valve regurgitation. No evidence of  mitral valve stenosis.   Tricuspid Valve: The tricuspid valve is normal in structure. Tricuspid  valve regurgitation is not demonstrated. No evidence of tricuspid  stenosis.   Aortic Valve: The aortic valve is tricuspid. There is moderate  calcification of the aortic valve. There is moderate thickening of the  aortic valve. Aortic valve regurgitation is not visualized. Moderate  aortic stenosis is present. Aortic valve mean  gradient measures 17.0 mmHg. Aortic valve peak gradient measures 32.9  mmHg. Aortic valve area, by VTI measures 1.17 cm.   Pulmonic Valve: The pulmonic valve was normal in structure. Pulmonic valve  regurgitation is not visualized. No evidence of pulmonic stenosis.   Aorta: The aortic root is normal in size and structure. There is  borderline dilatation of the ascending aorta, measuring 37 mm.   Venous: The inferior vena cava is normal in size with greater than 50%  respiratory variability, suggesting right atrial pressure of 3 mmHg.   IAS/Shunts: No atrial level shunt detected by color flow Doppler.   Patient Profile     Tyreon Frigon is a 77 y.o. male with a hx of CAD status post CABG in 2015, moderate aortic stenosis, diastolic heart failure, OSA, atrial fibrillation,  symptomatic bradycardia status post PPM. He was seen in office last week for persistent SOB and plan was to do PET stress. However symptoms worsened with progressing chest pain and patient presented to the ED for further evaluation.  Assessment & Plan    CAD Unstable angina NSTEMI  Patient s/p CABG in 2015. Coronary angiography 06/07/21: "normal left main with severe mid-LAD disease and patent LIMA graft to LAD but with slow flow in the distal graft with possible filling defect vs streaming; in either case, the distal vessel is very small, too small for PCI. There were patent stents in second obtuse marginal branch. Distal circumflex was chronically occluded with right to left collaterals. Sequential SVG to two OM branches was chronically occluded. RCA contained a widely patent stent in proximal portion". Echocardiogram 12/15/2021 showed EF 55 to 60%, low normal RV function, mild to moderate left atrial enlargement, small pericardial effusion, moderate aortic stenosis. Lexiscan Myoview on 12/16/2021 showed no ischemia or infarction, EF 58%.  Patient with worsening chest pain prompting ordering of outpatient cardiac PET, now admitted with accelerating angina. Troponin 27->27.  Chest pain recurring this AM, 8/10. Will increase nitroglycerin infusion. ECG without significant changes from admission tracing. Cycle troponin again this morning. Given worsened pain this AM, will need to have LHC today. Limit contrast use with creatinine 2.02 (*recent baseline closer to 1.6-1.7). Continue heparin, ASA, statin, Imdur, Coreg  Shared Decision Making/Informed Consent The risks [stroke (1 in 1000), death (1 in 1000), kidney failure [usually temporary] (1 in 500), bleeding (1 in 200), allergic reaction [possibly serious] (1 in 200)], benefits (diagnostic support and management of coronary artery disease) and alternatives of a cardiac catheterization were discussed in detail with Mr. Mullaly and he is willing to  proceed.   Aortic stenosis Chronic diastolic CHF  Moderate AS on echocardiogram 12/15/2021.  Repeat echo during hospitalization 06/2022 showed EF 65 to 70%, normal RV function, moderate aortic stenosis (mean gradient 17 mmHg, V-max 2.9 m/s, VTI 1.2 cm, DI 0.37).   Patient euvolemic on physical exam today. In light of creatinine elevated above baseline, will hold further lasix Repeat TTE this admission  Atrial fibrillation  A paced rhythm today. Continue Coreg. Hold Eliquis and bridge with heparin pending LHC.  Hypertension  BP stable at this time. Will continue to hold Lisinopril to allow for titration of nitro for chest pain. Continue Imdur and Coreg.  AKI on CKD  Creatinine elevated to 2.02 this morning. Baseline closer to 1.6. Will need careful monitoring of renal function following LHC.   Hyperlipidemia  Continue Crestor  QD.    For questions or updates, please contact [facility] Please consult [email or web address] for contact info under        Signed, Perlie Gold, PA-C  11/16/2022, 10:44 AM

## 2022-11-16 NOTE — Progress Notes (Addendum)
ANTICOAGULATION CONSULT NOTE - Follow-Up Consult  Pharmacy Consult for Heparin infusion Indication: chest pain/ACS  Allergies  Allergen Reactions   Acacia Itching   Metformin And Related Other (See Comments)    Metformin caused the patient's legs to become weak    Patient Measurements: Height:  (170.2 cm) Weight: 98.4 kg (216 lb 14.9 oz) IBW/kg (Calculated) : 66.1 Heparin Dosing Weight: 87.4 kg  Vital Signs: Temp: 97.4 F (36.3 C) (04/17 0917) Temp Source: Oral (04/17 0917) BP: 132/93 (04/17 1115) Pulse Rate: 59 (04/17 1115)  Labs: Recent Labs    11/15/22 1816 11/15/22 1950 11/15/22 2330 11/16/22 0744  HGB 14.8  --   --   --   HCT 45.8  --   --   --   PLT 228  --   --   --   APTT  --   --  34 91*  LABPROT 16.6*  --   --   --   INR 1.4*  --   --   --   HEPARINUNFRC  --   --  >1.10* >1.10*  CREATININE 2.02*  --   --   --   TROPONINIHS 27* 27*  --   --      Estimated Creatinine Clearance: 34.8 mL/min (A) (by C-G formula based on SCr of 2.02 mg/dL (H)).   Medical History: Past Medical History:  Diagnosis Date   Atrial fibrillation    Deafness in left ear    Hypertension    Symptomatic bradycardia 04/2021   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[place], [place]    Medications:  (Not in a hospital admission)  Scheduled:  Infusions:  PRN:   Assessment: 40 yom with a history of T2DM, CKD, HLD, AF, OSA, CAD, PAD, HF. Patient is presenting with chest pain. Heparin per pharmacy consult placed for chest pain/ACS.  Patient is on apixaban prior to arrival. Last dose 4/16 ~1000 per patient. Will require aPTT monitoring due to likely falsely high anti-Xa level secondary to DOAC use.  aPTT 91, therapeutic Heparin level >1.1, not correlating with aPTT in the setting of recent apixaban administration Current heparin infusion rate: 1200 units/hr  Hgb 14.8; plt 228 No s/sx of bleeding, per RN report  Goal of Therapy:  Heparin level 0.3-0.7 units/ml aPTT 66-102 seconds Monitor  platelets by anticoagulation protocol: Yes   Plan:  Continue heparin infusion at 1200 units/hr Check aPTT in 8 hours and daily while on heparin Continue to monitor via aPTT until levels are correlated Monitor daily CBC, aPTT and heparin level, and for s/sx of bleeding F/u anticoagulation plan s/p cath    Wilburn Cornelia, PharmD, BCPS Clinical Pharmacist 11/16/2022 11:38 AM   Please refer to AMION for pharmacy phone number

## 2022-11-16 NOTE — ED Notes (Signed)
Pt has bloody nose. No other bleeding noted. Removed his O2 that he was using while he was sleeping to avoid drying it out.

## 2022-11-16 NOTE — ED Notes (Signed)
Heparin level collected and sent to lab

## 2022-11-17 ENCOUNTER — Encounter (HOSPITAL_COMMUNITY)
Admission: EM | Disposition: A | Discharge status: Home / Self Care | Payer: Self-pay | Source: Home / Self Care | Attending: Internal Medicine

## 2022-11-17 DIAGNOSIS — E785 Hyperlipidemia, unspecified: Secondary | ICD-10-CM | POA: Diagnosis not present

## 2022-11-17 DIAGNOSIS — I10 Essential (primary) hypertension: Secondary | ICD-10-CM | POA: Diagnosis not present

## 2022-11-17 DIAGNOSIS — I2510 Atherosclerotic heart disease of native coronary artery without angina pectoris: Secondary | ICD-10-CM | POA: Diagnosis not present

## 2022-11-17 DIAGNOSIS — R7989 Other specified abnormal findings of blood chemistry: Secondary | ICD-10-CM | POA: Diagnosis not present

## 2022-11-17 DIAGNOSIS — I214 Non-ST elevation (NSTEMI) myocardial infarction: Secondary | ICD-10-CM | POA: Diagnosis not present

## 2022-11-17 HISTORY — PX: LEFT HEART CATH AND CORS/GRAFTS ANGIOGRAPHY: CATH118250

## 2022-11-17 LAB — BASIC METABOLIC PANEL
Anion gap: 12 (ref 5–15)
BUN: 28 mg/dL — ABNORMAL HIGH (ref 8–23)
CO2: 28 mmol/L (ref 22–32)
Calcium: 10.2 mg/dL (ref 8.9–10.3)
Chloride: 98 mmol/L (ref 98–111)
Creatinine, Ser: 1.76 mg/dL — ABNORMAL HIGH (ref 0.61–1.24)
GFR, Estimated: 40 mL/min — ABNORMAL LOW (ref >60)
Glucose, Bld: 105 mg/dL — ABNORMAL HIGH (ref 70–99)
Potassium: 3.8 mmol/L (ref 3.5–5.1)
Sodium: 138 mmol/L (ref 135–145)

## 2022-11-17 LAB — CBC
HCT: 46.6 % (ref 39.0–52.0)
Hemoglobin: 15.4 g/dL (ref 13.0–17.0)
MCH: 28.5 pg (ref 26.0–34.0)
MCHC: 33 g/dL (ref 30.0–36.0)
MCV: 86.3 fL (ref 80.0–100.0)
Platelets: 187 10*3/uL (ref 150–400)
RBC: 5.4 MIL/uL (ref 4.22–5.81)
RDW: 17.3 % — ABNORMAL HIGH (ref 11.5–15.5)
WBC: 5.7 10*3/uL (ref 4.0–10.5)
nRBC: 0 % (ref 0.0–0.2)

## 2022-11-17 LAB — APTT: aPTT: 98 seconds — ABNORMAL HIGH (ref 24–36)

## 2022-11-17 LAB — HEPARIN LEVEL (UNFRACTIONATED): Heparin Unfractionated: >1.1 IU/mL — ABNORMAL HIGH (ref 0.30–0.70)

## 2022-11-17 SURGERY — LEFT HEART CATH AND CORS/GRAFTS ANGIOGRAPHY
Anesthesia: LOCAL

## 2022-11-17 MED ORDER — VERAPAMIL HCL 2.5 MG/ML IV SOLN
INTRAVENOUS | Status: AC
  Filled 2022-11-17: qty 2

## 2022-11-17 MED ORDER — LABETALOL HCL 5 MG/ML IV SOLN: 10.0000 mg | INTRAVENOUS | Status: AC | PRN

## 2022-11-17 MED ORDER — VERAPAMIL HCL 2.5 MG/ML IV SOLN
INTRAVENOUS | Status: DC | PRN
  Administered 2022-11-17: 10 mL via INTRA_ARTERIAL

## 2022-11-17 MED ORDER — MIDAZOLAM HCL 2 MG/2ML IJ SOLN
INTRAMUSCULAR | Status: AC
  Filled 2022-11-17: qty 2

## 2022-11-17 MED ORDER — HEPARIN SODIUM (PORCINE) 1000 UNIT/ML IJ SOLN
INTRAMUSCULAR | Status: AC
  Filled 2022-11-17: qty 10

## 2022-11-17 MED ORDER — SODIUM CHLORIDE 0.9 % IV SOLN: 250.0000 mL | INTRAVENOUS | Status: DC | PRN

## 2022-11-17 MED ORDER — SODIUM CHLORIDE 0.9% FLUSH
3.0000 mL | Freq: Two times a day (BID) | INTRAVENOUS | Status: DC
  Administered 2022-11-17 – 2022-11-18 (×2): 3 mL via INTRAVENOUS

## 2022-11-17 MED ORDER — LIDOCAINE HCL (PF) 1 % IJ SOLN
INTRAMUSCULAR | Status: AC
  Filled 2022-11-17: qty 30

## 2022-11-17 MED ORDER — LIDOCAINE HCL (PF) 1 % IJ SOLN
INTRAMUSCULAR | Status: DC | PRN
  Administered 2022-11-17: 2 mL

## 2022-11-17 MED ORDER — FENTANYL CITRATE (PF) 100 MCG/2ML IJ SOLN
INTRAMUSCULAR | Status: DC | PRN
  Administered 2022-11-17: 25 ug via INTRAVENOUS

## 2022-11-17 MED ORDER — HYDRALAZINE HCL 20 MG/ML IJ SOLN: 10.0000 mg | INTRAMUSCULAR | Status: AC | PRN

## 2022-11-17 MED ORDER — ACETAMINOPHEN 325 MG PO TABS: 650.0000 mg | ORAL_TABLET | ORAL | Status: DC | PRN

## 2022-11-17 MED ORDER — HEPARIN SODIUM (PORCINE) 5000 UNIT/ML IJ SOLN
5000.0000 [IU] | Freq: Three times a day (TID) | INTRAMUSCULAR | Status: DC
  Administered 2022-11-17 – 2022-11-18 (×2): 5000 [IU] via SUBCUTANEOUS
  Filled 2022-11-17 (×2): qty 1

## 2022-11-17 MED ORDER — SODIUM CHLORIDE 0.9% FLUSH: 3.0000 mL | INTRAVENOUS | Status: DC | PRN

## 2022-11-17 MED ORDER — MIDAZOLAM HCL 2 MG/2ML IJ SOLN
INTRAMUSCULAR | Status: DC | PRN
  Administered 2022-11-17: 2 mg via INTRAVENOUS

## 2022-11-17 MED ORDER — FENTANYL CITRATE (PF) 100 MCG/2ML IJ SOLN
INTRAMUSCULAR | Status: AC
  Filled 2022-11-17: qty 2

## 2022-11-17 MED ORDER — SODIUM CHLORIDE 0.9 % IV SOLN: INTRAVENOUS | Status: AC

## 2022-11-17 MED ORDER — HEPARIN (PORCINE) IN NACL 1000-0.9 UT/500ML-% IV SOLN
INTRAVENOUS | Status: DC | PRN
  Administered 2022-11-17 (×2): 500 mL

## 2022-11-17 MED ORDER — ONDANSETRON HCL 4 MG/2ML IJ SOLN: 4.0000 mg | Freq: Four times a day (QID) | INTRAMUSCULAR | Status: DC | PRN

## 2022-11-17 MED ORDER — IOHEXOL 350 MG/ML SOLN
INTRAVENOUS | Status: DC | PRN
  Administered 2022-11-17: 35 mL via INTRA_ARTERIAL

## 2022-11-17 MED ORDER — SODIUM CHLORIDE 0.9 % IV SOLN: INTRAVENOUS | Status: DC

## 2022-11-17 MED ORDER — HEPARIN SODIUM (PORCINE) 1000 UNIT/ML IJ SOLN
INTRAMUSCULAR | Status: DC | PRN
  Administered 2022-11-17: 4500 [IU] via INTRAVENOUS

## 2022-11-17 SURGICAL SUPPLY — 12 items
BAND ZEPHYR COMPRESS 30 LONG (HEMOSTASIS) IMPLANT
CATH INFINITI 5FR AL1 (CATHETERS) IMPLANT
CATH INFINITI 5FR MULTPACK ANG (CATHETERS) IMPLANT
GLIDESHEATH SLEND SS 6F .021 (SHEATH) IMPLANT
GUIDEWIRE INQWIRE 1.5J.035X260 (WIRE) IMPLANT
INQWIRE 1.5J .035X260CM (WIRE) ×1
KIT HEART LEFT (KITS) ×1 IMPLANT
PACK CARDIAC CATHETERIZATION (CUSTOM PROCEDURE TRAY) ×1 IMPLANT
SHEATH PROBE COVER 6X72 (BAG) IMPLANT
TRANSDUCER W/STOPCOCK (MISCELLANEOUS) ×1 IMPLANT
TUBING CIL FLEX 10 FLL-RA (TUBING) ×1 IMPLANT
WIRE HI TORQ VERSACORE-J 145CM (WIRE) IMPLANT

## 2022-11-17 NOTE — Interval H&P Note (Signed)
Cath Lab Visit (complete for each Cath Lab visit)  Clinical Evaluation Leading to the Procedure:   ACS: Yes.    Non-ACS:    Anginal Classification: CCS IV  Anti-ischemic medical therapy: Minimal Therapy (1 class of medications)  Non-Invasive Test Results: No non-invasive testing performed  Prior CABG: Previous CABG      History and Physical Interval Note:  11/17/2022 3:08 PM  Johnny [name]  has presented today for surgery, with the diagnosis of nstemi.  The various methods of treatment have been discussed with the patient and family. After consideration of risks, benefits and other options for treatment, the patient has consented to  Procedure(s): LEFT HEART CATH AND CORS/GRAFTS ANGIOGRAPHY (N/A) as a surgical intervention.  The patient's history has been reviewed, patient examined, no change in status, stable for surgery.  I have reviewed the patient's chart and labs.  Questions were answered to the patient's satisfaction.     Lance Muss

## 2022-11-17 NOTE — Progress Notes (Signed)
Rounding Note    Patient Name: Johnny [name] Date of Encounter: 11/17/2022  [facility] Cardiologist: Little Ishikawa, MD   Subjective   Continue to have some chest pain throughout the night, 4/10 in intensity. Mild dyspnea. Per nurse, frequent apnea episode and drop in O2 sat during sleep.   Inpatient Medications    Scheduled Meds:  aspirin EC  81 mg Oral Daily   atorvastatin  80 mg Oral Daily   carvedilol  6.25 mg Oral BID   cyanocobalamin  1,000 mcg Oral Daily   famotidine  40 mg Oral Daily   loratadine  10 mg Oral Daily   pantoprazole  40 mg Oral QAC breakfast   venlafaxine XR  150 mg Oral Q breakfast   Continuous Infusions:  sodium chloride 75 mL/hr at 11/17/22 0511   heparin 1,200 Units/hr (11/17/22 0510)   nitroGLYCERIN 10 mcg/min (11/17/22 0510)   PRN Meds: acetaminophen, albuterol, fluticasone, montelukast, nitroGLYCERIN, ondansetron (ZOFRAN) IV, sodium chloride   Vital Signs    Vitals:   11/16/22 1600 11/16/22 2002 11/16/22 2259 11/17/22 0328  BP:  114/72 113/89 131/88  Pulse:  61 60 62  Resp:  Temp:  98 F (36.7 C) 97.9 F (36.6 C) 97.8 F (36.6 C)  TempSrc:  Oral Oral Oral  SpO2:  94% 97% (!) 80%  Weight: 92.9 kg     Height:        Intake/Output Summary (Last 24 hours) at 11/17/2022 0737 Last data filed at 11/17/2022 0510 Gross per 24 hour  Intake 405.5 ml  Output 1620 ml  Net -1214.5 ml      11/16/2022    4:00 PM 11/15/2022   11:18 PM 11/15/2022    5:47 PM  Last 3 Weights  Weight (lbs) 204 lb 12.9 oz 216 lb 14.9 oz 217 lb  Weight (kg) 92.9 kg 98.4 kg 98.431 kg      Telemetry    Paced rhythm - Personally Reviewed  ECG    Sinus rhythm with mild ST depression in the inferolateral leads - Personally Reviewed  Physical Exam   GEN: No acute distress.   Neck: No JVD Cardiac: RRR, no murmurs, rubs, or gallops.  Respiratory: Clear to auscultation bilaterally. GI: Soft, nontender, non-distended  MS: No  edema; No deformity. Neuro:  Nonfocal  Psych: Normal affect   Labs    High Sensitivity Troponin:   Recent Labs  Lab 11/15/22 1816 11/15/22 1950 11/16/22 1124 11/16/22 1435  TROPONINIHS 27* 27* 24* 22*     Chemistry Recent Labs  Lab 11/10/22 1244 11/15/22 1816 11/16/22 1435 11/17/22 0218  NA 142 137 136 138  K 5.3* 4.0 4.4 3.8  CL 101 99 96* 98  CO2 GLUCOSE 85 86 103* 105*  BUN 17 27* 29* 28*  CREATININE 1.76* 2.02* 1.83* 1.76*  CALCIUM 10.8* 10.4* 10.7* 10.2  MG 1.8 1.9  --   --   PROT  --  7.1  --   --   ALBUMIN  --  3.8  --   --   AST  --  29  --   --   ALT  --  30  --   --   ALKPHOS  --  65  --   --   BILITOT  --  0.7  --   --   GFRNONAA  --  34* 38* 40*  ANIONGAP  --  12 14 12  Lipids No results for input(s): "CHOL", "TRIG", "HDL", "LABVLDL", "LDLCALC", "CHOLHDL" in the last 168 hours.  Hematology Recent Labs  Lab 11/15/22 1816 11/17/22 0218  WBC 7.8 5.7  RBC 5.13 5.40  HGB 14.8 15.4  HCT 45.8 46.6  MCV 89.3 86.3  MCH 28.8 28.5  MCHC 32.3 33.0  RDW 17.6* 17.3*  PLT 228 187   Thyroid No results for input(s): "TSH", "FREET4" in the last 168 hours.  BNP Recent Labs  Lab 11/15/22 1800  BNP 75.9    DDimer No results for input(s): "DDIMER" in the last 168 hours.   Radiology    ECHOCARDIOGRAM COMPLETE  Result Date: 11/16/2022    ECHOCARDIOGRAM REPORT   Patient Name:   COLTYN HANNING Date of Exam: 11/16/2022 Medical Rec #:  [id number]     Height:       67.0 in Accession #:    [id number]    Weight:       216.9 lb Date of Birth:  1945-11-16    BSA:          2.093 m Patient Age:    76 years      BP:           122/86 mmHg Patient Gender: M             HR:           60 bpm. Exam Location:  Inpatient Procedure: 2D Echo, Color Doppler, Cardiac Doppler and Intracardiac            Opacification Agent Indications:    Abnormal ECG  History:        Patient has prior history of Echocardiogram examinations, most                 recent 06/19/2022. CHF,  CAD, Prior CABG and Pacemaker, AS,                 Arrythmias:Atrial Fibrillation and Bradycardia; Risk                 Factors:Sleep Apnea.  Sonographer:    Milda Smart Referring Phys: [id number] [facility] KH[facility]  Sonographer Comments: Technically difficult study due to poor echo windows. Image acquisition challenging due to patient body habitus and Image acquisition challenging due to respiratory motion. IMPRESSIONS  1. Left ventricular ejection fraction, by estimation, is 65 to 70%. The left ventricle has normal function. The left ventricle has no regional wall motion abnormalities. There is moderate concentric left ventricular hypertrophy. Left ventricular diastolic parameters are consistent with Grade II diastolic dysfunction (pseudonormalization).  2. Right ventricular systolic function is mildly reduced. The right ventricular size is normal. There is normal pulmonary artery systolic pressure. The estimated right ventricular systolic pressure is 23.8 mmHg.  3. Left atrial size was mildly dilated.  4. The mitral valve is normal in structure. Trivial mitral valve regurgitation. No evidence of mitral stenosis.  5. The aortic valve is tricuspid. There is moderate calcification of the aortic valve. There is moderate thickening of the aortic valve. Aortic valve regurgitation is not visualized. Moderate aortic valve stenosis. Aortic valve Vmax measures 3.21 m/s.  6. There is borderline dilatation of the ascending aorta, measuring 39 mm.  7. The inferior vena cava is normal in size with greater than 50% respiratory variability, suggesting right atrial pressure of 3 mmHg. Comparison(s): Prior images reviewed side by side. Aortic stenosis is slightly worse, but remains moderate. FINDINGS  Left Ventricle: Left ventricular ejection fraction, by estimation, is 65 to  70%. The left ventricle has normal function. The left ventricle has no regional wall motion abnormalities. Definity contrast agent was given IV to delineate  the left ventricular  endocardial borders. The left ventricular internal cavity size was normal in size. There is moderate concentric left ventricular hypertrophy. Abnormal (paradoxical) septal motion consistent with post-operative status. Left ventricular diastolic parameters are consistent with Grade II diastolic dysfunction (pseudonormalization). Right Ventricle: The right ventricular size is normal. No increase in right ventricular wall thickness. Right ventricular systolic function is mildly reduced. There is normal pulmonary artery systolic pressure. The tricuspid regurgitant velocity is 2.28 m/s, and with an assumed right atrial pressure of 3 mmHg, the estimated right ventricular systolic pressure is 23.8 mmHg. Left Atrium: Left atrial size was mildly dilated. Right Atrium: Right atrial size was normal in size. Pericardium: There is no evidence of pericardial effusion. Mitral Valve: The mitral valve is normal in structure. Mild mitral annular calcification. Trivial mitral valve regurgitation. No evidence of mitral valve stenosis. MV peak gradient, 1.4 mmHg. The mean mitral valve gradient is 1.0 mmHg. Tricuspid Valve: The tricuspid valve is normal in structure. Tricuspid valve regurgitation is not demonstrated. Aortic Valve: The aortic valve is tricuspid. There is moderate calcification of the aortic valve. There is moderate thickening of the aortic valve. Aortic valve regurgitation is not visualized. Moderate aortic stenosis is present. Aortic valve mean gradient measures 23.0 mmHg. Aortic valve peak gradient measures 41.2 mmHg. Aortic valve area, by VTI measures 1.01 cm. Pulmonic Valve: The pulmonic valve was not well visualized. Pulmonic valve regurgitation is not visualized. No evidence of pulmonic stenosis. Aorta: The aortic root is normal in size and structure. There is borderline dilatation of the ascending aorta, measuring 39 mm. Venous: The inferior vena cava is normal in size with greater than 50%  respiratory variability, suggesting right atrial pressure of 3 mmHg. IAS/Shunts: The interatrial septum was not well visualized. Additional Comments: A device lead is visualized in the right ventricle and right atrium.  LEFT VENTRICLE PLAX 2D LVIDd:         4.50 cm   Diastology LVIDs:         3.70 cm   LV e' medial:    4.14 cm/s LV PW:         1.40 cm   LV E/e' medial:  11.2 LV IVS:        1.30 cm   LV e' lateral:   6.57 cm/s LVOT diam:     2.00 cm   LV E/e' lateral: 7.0 LV SV:         62 LV SV Index:   30 LVOT Area:     3.14 cm  RIGHT VENTRICLE RV S prime:     7.38 cm/s TAPSE (M-mode): 1.6 cm LEFT ATRIUM             Index        RIGHT ATRIUM           Index LA diam:        4.10 cm 1.96 cm/m   RA Area:     16.20 cm LA Vol (A2C):   75.3 ml 35.97 ml/m  RA Volume:   38.40 ml  18.34 ml/m LA Vol (A4C):   62.7 ml 29.95 ml/m LA Biplane Vol: 72.3 ml 34.54 ml/m  AORTIC VALVE AV Area (Vmax):    0.94 cm AV Area (Vmean):   0.96 cm AV Area (VTI):     1.01 cm AV Vmax:  321.00 cm/s AV Vmean:          230.000 cm/s AV VTI:            0.613 m AV Peak Grad:      41.2 mmHg AV Mean Grad:      23.0 mmHg LVOT Vmax:         95.60 cm/s LVOT Vmean:        70.500 cm/s LVOT VTI:          0.198 m LVOT/AV VTI ratio: 0.32  AORTA Ao Root diam: 3.00 cm Ao Asc diam:  3.90 cm MITRAL VALVE               TRICUSPID VALVE MV Area (PHT): 2.14 cm    TR Peak grad:   20.8 mmHg MV Area VTI:   2.80 cm    TR Mean grad:   13.0 mmHg MV Peak grad:  1.4 mmHg    TR Vmax:        228.00 cm/s MV Mean grad:  1.0 mmHg    TR Vmean:       171.0 cm/s MV Vmax:       0.60 m/s MV Vmean:      40.4 cm/s   SHUNTS MV Decel Time: 355 msec    Systemic VTI:  0.20 m MR Peak grad: 89.5 mmHg    Systemic Diam: 2.00 cm MR Vmax:      473.00 cm/s MV E velocity: 46.20 cm/s MV A velocity: 51.40 cm/s MV E/A ratio:  0.90 Mihai Croitoru MD Electronically signed by Thurmon Fair MD Signature Date/Time: 11/16/2022/4:07:12 PM    Final    DG Chest Portable 1 View  Result  Date: 11/15/2022 CLINICAL DATA:  Chest pain EXAM: PORTABLE CHEST 1 VIEW COMPARISON:  X-ray 09/13/2022 and older FINDINGS: Status post median sternotomy. Two of the upper wires are fractured. Left upper chest pacemaker. Underinflation. Blunting of the left costophrenic angle. Tiny effusion versus pleural thickening. No pneumothorax. Stable cardiopericardial silhouette with slight central vascular congestion. No consolidation. Degenerative changes of the right shoulder. Left shoulder arthroplasty with coracoid screw. Overlapping cardiac leads. IMPRESSION: Postop chest with underinflation. Tiny left effusion versus pleural thickening. Central vascular congestion. Pacemaker. Electronically Signed   By: Karen Kays M.D.   On: 11/15/2022 18:41    Cardiac Studies   Echo 11/16/2022  1. Left ventricular ejection fraction, by estimation, is 65 to 70%. The  left ventricle has normal function. The left ventricle has no regional  wall motion abnormalities. There is moderate concentric left ventricular  hypertrophy. Left ventricular  diastolic parameters are consistent with Grade II diastolic dysfunction  (pseudonormalization).   2. Right ventricular systolic function is mildly reduced. The right  ventricular size is normal. There is normal pulmonary artery systolic  pressure. The estimated right ventricular systolic pressure is 23.8 mmHg.   3. Left atrial size was mildly dilated.   4. The mitral valve is normal in structure. Trivial mitral valve  regurgitation. No evidence of mitral stenosis.   5. The aortic valve is tricuspid. There is moderate calcification of the  aortic valve. There is moderate thickening of the aortic valve. Aortic  valve regurgitation is not visualized. Moderate aortic valve stenosis.  Aortic valve Vmax measures 3.21 m/s.   6. There is borderline dilatation of the ascending aorta, measuring 39  mm.   7. The inferior vena cava is normal in size with greater than 50%  respiratory  variability, suggesting right atrial pressure of 3 mmHg.  Comparison(s): Prior images reviewed side by side. Aortic stenosis is  slightly worse, but remains moderate.    Patient Profile     77 y.o. male with PMH of CAD s/p CABG 2015, moderate AS, chronic diastolic CHF, OSA, afib and symptomatic bradycardia s/p PPM who presented with chest pain  Assessment & Plan    Chest pain  - Cath 06/07/2021 normal LM with severe mid LAD lesion, patent LIMA to LAD with slow flow in the distal vessel from possibly filling defect vs streaming, distal vessel too small for PCI. Patent stent in OM2. Distal LCx is chronically occluded with R to L collaterals. Sequential SVG to two OM branches chronically occluded. Patent stent in prox RCA.   - Myoview 11/2021 negative. EF normal on echo 06/2022. Recently seen due to worsening DOE, was planned to undergo PET stress. Came in with chest pain with EKG changes  - Echo 11/16/2022 EF 65-70%, grade 2 DD, no RWMA, moderate LVH, RVSP 23.8 mmHg, moderate AS.  - pending cath today  CAD s/p CABG 2015  Moderate AS: mean aortic valve gradient 17 mmHg  Atrial fibrillation: Eliquis on hold  OSA: severe OSA on sleep study 12/2021, need BiPAP but not started.   Symptomatic bradycardia s/p PPM  HLDL on crestor  CKD: baseline 1.6, arrived with Cr 2.02, after hydration, 1.76 this morning  For questions or updates, please contact [facility] Please consult [email or web address] for contact info under        Signed, Azalee Course, PA  11/17/2022, 7:37 AM

## 2022-11-17 NOTE — Care Management (Signed)
  Transition of Care [facility]) Screening Note   Patient Details  Name: Johnny [name] Date of Birth: 09/19/1945   [facility]) CM/SW Contact:    Gala Lewandowsky, RN Phone Number: 11/17/2022, 12:30 PM    Transition of [facility]) has reviewed the patient and no TOC needs have been identified at this time. Patient presented for chest pain-plan for cardiac cath. Patient has insurance and PCP.  We will continue to monitor patient advancement through interdisciplinary progression rounds. If new patient transition needs arise, please place a TOC consult.

## 2022-11-17 NOTE — Progress Notes (Signed)
ANTICOAGULATION CONSULT NOTE - Follow-Up Consult  Pharmacy Consult for Heparin infusion Indication: chest pain/ACS  Allergies  Allergen Reactions   Acacia Itching   Metformin And Related Other (See Comments)    Metformin caused the patient's legs to become weak    Patient Measurements: Height:  (170.2 cm) Weight: 92.9 kg (204 lb 12.9 oz) IBW/kg (Calculated) : 66.1 Heparin Dosing Weight: 87.4 kg  Vital Signs: Temp: 97.8 F (36.6 C) (04/18 1100) Temp Source: Oral (04/18 1100) BP: 121/85 (04/18 1100) Pulse Rate: 61 (04/18 1100)  Labs: Recent Labs    11/15/22 1816 11/15/22 1950 11/15/22 2330 11/15/22 2330 11/16/22 0744 11/16/22 1124 11/16/22 1435 11/16/22 1654 11/17/22 0218  HGB 14.8  --   --   --   --   --   --   --  15.4  HCT 45.8  --   --   --   --   --   --   --  46.6  PLT 228  --   --   --   --   --   --   --  187  APTT  --   --  34   < > 91*  --   --  89* 98*  LABPROT 16.6*  --   --   --   --   --   --   --   --   INR 1.4*  --   --   --   --   --   --   --   --   HEPARINUNFRC  --   --  >1.10*  --  >1.10*  --   --   --  >1.10*  CREATININE 2.02*  --   --   --   --   --  1.83*  --  1.76*  TROPONINIHS 27* 27*  --   --   --  24* 22*  --   --    < > = values in this interval not displayed.     Estimated Creatinine Clearance: 38.8 mL/min (A) (by C-G formula based on SCr of 1.76 mg/dL (H)).   Medical History: Past Medical History:  Diagnosis Date   Atrial fibrillation    Deafness in left ear    Hypertension    Symptomatic bradycardia 04/2021   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[place], [place]    Assessment: 73 yom with a history of T2DM, CKD, HLD, AF, OSA, CAD, PAD, HF. Patient is presenting with chest pain. Heparin per pharmacy consult placed for chest pain/ACS.  Patient is on apixaban prior to arrival. Last dose 4/16 ~1000 per patient. Will require aPTT monitoring due to likely falsely high anti-Xa level secondary to DOAC use.  aPTT 98, therapeutic  Goal of Therapy:   Heparin level 0.3-0.7 units/ml aPTT 66-102 seconds Monitor platelets by anticoagulation protocol: Yes   Plan:  Continue heparin infusion at 1200 units/hr Will follow plans post cath  Harland German, PharmD Clinical Pharmacist **Pharmacist phone directory can now be found on [email or web address] ([email or web address]).  Listed under [facility].

## 2022-11-18 ENCOUNTER — Encounter: Payer: Self-pay | Admitting: *Deleted

## 2022-11-18 ENCOUNTER — Encounter (HOSPITAL_COMMUNITY): Payer: Self-pay | Admitting: Interventional Cardiology

## 2022-11-18 DIAGNOSIS — R7989 Other specified abnormal findings of blood chemistry: Secondary | ICD-10-CM

## 2022-11-18 DIAGNOSIS — I480 Paroxysmal atrial fibrillation: Secondary | ICD-10-CM | POA: Diagnosis not present

## 2022-11-18 DIAGNOSIS — I214 Non-ST elevation (NSTEMI) myocardial infarction: Secondary | ICD-10-CM | POA: Diagnosis not present

## 2022-11-18 LAB — BASIC METABOLIC PANEL
Anion gap: 10 (ref 5–15)
BUN: 19 mg/dL (ref 8–23)
CO2: 25 mmol/L (ref 22–32)
Calcium: 10.1 mg/dL (ref 8.9–10.3)
Chloride: 102 mmol/L (ref 98–111)
Creatinine, Ser: 1.63 mg/dL — ABNORMAL HIGH (ref 0.61–1.24)
GFR, Estimated: 43 mL/min — ABNORMAL LOW (ref >60)
Glucose, Bld: 115 mg/dL — ABNORMAL HIGH (ref 70–99)
Potassium: 4.1 mmol/L (ref 3.5–5.1)
Sodium: 137 mmol/L (ref 135–145)

## 2022-11-18 LAB — CBC
HCT: 43.6 % (ref 39.0–52.0)
Hemoglobin: 14.2 g/dL (ref 13.0–17.0)
MCH: 28.7 pg (ref 26.0–34.0)
MCHC: 32.6 g/dL (ref 30.0–36.0)
MCV: 88.1 fL (ref 80.0–100.0)
Platelets: 175 10*3/uL (ref 150–400)
RBC: 4.95 MIL/uL (ref 4.22–5.81)
RDW: 17.6 % — ABNORMAL HIGH (ref 11.5–15.5)
WBC: 6.3 10*3/uL (ref 4.0–10.5)
nRBC: 0 % (ref 0.0–0.2)

## 2022-11-18 LAB — APTT: aPTT: 32 seconds (ref 24–36)

## 2022-11-18 LAB — D-DIMER, QUANTITATIVE: D-Dimer, Quant: 0.64 ug/mL-FEU — ABNORMAL HIGH (ref 0.00–0.50)

## 2022-11-18 LAB — LIPOPROTEIN A (LPA): Lipoprotein (a): 10.9 nmol/L (ref <75.0)

## 2022-11-18 MED ORDER — APIXABAN 5 MG PO TABS
5.0000 mg | ORAL_TABLET | Freq: Two times a day (BID) | ORAL | Status: DC
  Administered 2022-11-18: 5 mg via ORAL
  Filled 2022-11-18: qty 1

## 2022-11-18 MED ORDER — FUROSEMIDE 40 MG PO TABS
40.0000 mg | ORAL_TABLET | ORAL | Status: DC | PRN
Start: 2022-11-18 — End: 2023-01-03

## 2022-11-18 NOTE — Discharge Instructions (Signed)
Once you have [place], please arrange follow-up with primary care doctor, sleep specialist, and local cardiologist.  It would also be beneficial to discuss with local social worker to see if you qualify to enroll in [place].

## 2022-11-18 NOTE — Discharge Summary (Signed)
Discharge Summary    Patient ID: Johnny [name] MRN: [id number]; DOB: June 02, 1946  Admit date: 11/15/2022 Discharge date: 11/18/2022  PCP:  Caesar Bookman, NP   [facility] Providers Cardiologist:  Little Ishikawa, MD        Discharge Diagnoses    Principal Problem:   NSTEMI (non-ST elevated myocardial infarction) Active Problems:   DM type 2 with diabetic peripheral neuropathy   Stage 3b chronic kidney disease   PAF (paroxysmal atrial fibrillation)   OSA (obstructive sleep apnea)   Chronic diastolic HF (heart failure)   Coronary artery disease involving native coronary artery of native heart   Essential hypertension   Chronic respiratory failure with hypoxia   Elevated troponin    Diagnostic Studies/Procedures    Echo 11/16/2022  1. Left ventricular ejection fraction, by estimation, is 65 to 70%. The left ventricle has normal function. The left ventricle has no regional wall motion abnormalities. There is moderate concentric left ventricular hypertrophy. Left ventricular diastolic parameters are consistent with Grade II diastolic dysfunction (pseudonormalization).   2. Right ventricular systolic function is mildly reduced. The right ventricular size is normal. There is normal pulmonary artery systolic pressure. The estimated right ventricular systolic pressure is 23.8 mmHg.   3. Left atrial size was mildly dilated.   4. The mitral valve is normal in structure. Trivial mitral valve regurgitation. No evidence of mitral stenosis.   5. The aortic valve is tricuspid. There is moderate calcification of the aortic valve. There is moderate thickening of the aortic valve. Aortic valve regurgitation is not visualized. Moderate aortic valve stenosis. Aortic valve Vmax measures 3.21 m/s.   6. There is borderline dilatation of the ascending aorta, measuring 39 mm.   7. The inferior vena cava is normal in size with greater than 50% respiratory variability, suggesting right  atrial pressure of 3 mmHg.   Comparison(s): Prior images reviewed side by side. Aortic stenosis is  slightly worse, but remains moderate.      Cath 11/17/2022   Prox RCA to Mid RCA lesion is 10% stenosed. Patent stents.   SVG to OM graft is occluded.  Native proximal to mid circumflex is widely patent.   1st Mrg lesion is 25% stenosed.   Dist Cx lesion is 100% stenosed.  Right to left and left to left collaterals.   LIMA to LAD is small and fills slowly but appears to be patent.  Distal LAD is very small.   Previously placed Mid Cx to Dist Cx stent of unknown type is  widely patent.   LV end diastolic pressure is mildly elevated.   There is mild aortic valve stenosis.   Appears to have adequate circulation to all three coronary distributions.   Chronically occluded distal circumflex is not a candidate for CTO PCI.  No target for PCI.  Continue aggressive medical therapy.   Results conveyed to the patient's daughter lives in [place].  She is most concerned about his social situation.  She notes that he lives with a roommate who is not a family member and is not a caretaker.  He needs more support and she thinks he needs to be placed in some type of facility.  Apparently, he does not have a good relationship with his family who is in [place].  She is hoping to speak to a Child psychotherapist to help with his social situation.   Diagnostic Dominance: Right   _____________   History of Present Illness  Johnny [name] is a 77 y.o. male with hx of CAD status post CABG in 2015, moderate aortic stenosis, diastolic heart failure, OSA, atrial fibrillation, symptomatic bradycardia status post PPM who is being seen 11/15/2022 for the evaluation of chest pain.  Lexiscan Myoview on 12/16/2021 showed no ischemia or infarction, EF 58%.  Echocardiogram 06/2022 showed EF 65 to 70%, normal RV function, moderate aortic stenosis (mean gradient 17 mmHg, V-max 2.9 m/s, VTI 1.2 cm, DI 0.37). He was seen in office  last week for persistent SOB and plan was to do PET stress. However symptoms worsened and he started having CP hence came to the ED for evaluation. Reports having symptoms with minimal exertion.  Feels pressure in chest with walking. No leg swelling. In the ED troponin was 27- no delta. EKG however showed ST depressions in V2-V4. VS are stable in the ED. Cardiology was called for admission.   Hospital Course     Consultants: Social worker PT OT   Patient was admitted to cardiology service.  Creatinine was initially elevated at 2.02, previous baseline 1.7.  Home Lasix held.  Serial troponin mildly elevated but remained flat.  Chest pain improved with IV heparin and IV nitroglycerin however did not completely go away.  Echocardiogram obtained on 11/16/2022 demonstrated EF 65-70%, grade 2 DD, no RWMA, moderate LVH, RVSP 23.8 mmHg, moderate AS.  Subsequent cardiac catheterization performed on the following day showed patent stent in RCA, occluded SVG-OM, patent prox to mid LCx, 25% OM1, 100% distal LCx to R to L and L to L collaterals (not a candidate for CTO PCI), LIMA-LAD is small and slow filling but patent, patent mid to distal LCx stent.  There was no culprit lesion to explain his chest discomfort.  Eliquis was resumed on 11/18/2022.  Repeat basic metabolic panel shows creatinine stable at 1.6.  Patient does have severe obstructive sleep apnea noted on previous sleep study in June 2023, it was recommended he follow-up with the sleep specialist to help start BiPAP therapy as outpatient.  Social worker was consulted to help with outpatient living situation.  He in the expressed wish to go to a facility in [place].  He was seen by PT who did not recommend skilled nursing facility.  He has family in [place], ultimately, he decided to go to [place] to be closer to his sister and apply for Medicaid there to get more assistance.  No home health was arranged since he will be outside service area.  He will  need to set up with a primary care provider, cardiologist, social worker and sleep specialist after he arrives in [place].  Rolling walker was provided to the patient prior to discharge.  D-dimer was obtained prior to discharge which came back 0.64, his age-adjusted D-dimer cutoff was 0.76.  He is deemed stable for discharge from the cardiac perspective.      Did the patient have an acute coronary syndrome (MI, NSTEMI, STEMI, etc) this admission?:  No.   The elevated Troponin was due to the acute medical illness (demand ischemia).         _____________  Discharge Vitals Blood pressure 116/78, pulse 60, temperature 97.9 F (36.6 C), temperature source Oral, resp. rate 16, height 5\' 7"  (1.702 m), weight 92.9 kg, SpO2 92 %.  Filed Weights   11/15/22 1747 11/15/22 2318 11/16/22 1600  Weight: 98.4 kg 98.4 kg 92.9 kg    Labs & Radiologic Studies    CBC Recent Labs  11/15/22 1816 11/17/22 0218 11/18/22 0225  WBC 7.8 5.7 6.3  NEUTROABS 5.2  --   --   HGB 14.8 15.4 14.2  HCT 45.8 46.6 43.6  MCV 89.3 86.3 88.1  PLT 228 187 175   Basic Metabolic Panel Recent Labs    16/10/96 1816 11/16/22 1435 11/17/22 0218 11/18/22 0715  NA 137   < > 138 137  K 4.0   < > 3.8 4.1  CL 99   < > 98 102  CO2 26   < > 28 25  GLUCOSE 86   < > 105* 115*  BUN 27*   < > 28* 19  CREATININE 2.02*   < > 1.76* 1.63*  CALCIUM 10.4*   < > 10.2 10.1  MG 1.9  --   --   --    < > = values in this interval not displayed.   Liver Function Tests Recent Labs    11/15/22 1816  AST 29  ALT 30  ALKPHOS 65  BILITOT 0.7  PROT 7.1  ALBUMIN 3.8   Recent Labs    11/15/22 1816  LIPASE 37   High Sensitivity Troponin:   Recent Labs  Lab 11/15/22 1816 11/15/22 1950 11/16/22 1124 11/16/22 1435  TROPONINIHS 27* 27* 24* 22*    BNP Invalid input(s): "POCBNP" D-Dimer Recent Labs    11/18/22 1524  DDIMER 0.64*   Hemoglobin A1C No results for input(s): "HGBA1C" in the last 72 hours. Fasting  Lipid Panel No results for input(s): "CHOL", "HDL", "LDLCALC", "TRIG", "CHOLHDL", "LDLDIRECT" in the last 72 hours. Thyroid Function Tests No results for input(s): "TSH", "T4TOTAL", "T3FREE", "THYROIDAB" in the last 72 hours.  Invalid input(s): "FREET3" _____________  CARDIAC CATHETERIZATION  Result Date: 11/17/2022   Prox RCA to Mid RCA lesion is 10% stenosed. Patent stents.   SVG to OM graft is occluded.  Native proximal to mid circumflex is widely patent.   1st Mrg lesion is 25% stenosed.   Dist Cx lesion is 100% stenosed.  Right to left and left to left collaterals.   LIMA to LAD is small and fills slowly but appears to be patent.  Distal LAD is very small.   Previously placed Mid Cx to Dist Cx stent of unknown type is  widely patent.   LV end diastolic pressure is mildly elevated.   There is mild aortic valve stenosis. Appears to have adequate circulation to all three coronary distributions.   Chronically occluded distal circumflex is not a candidate for CTO PCI.  No target for PCI.  Continue aggressive medical therapy. Results conveyed to the patient's daughter lives in [place].  She is most concerned about his social situation.  She notes that he lives with a roommate who is not a family member and is not a caretaker.  He needs more support and she thinks he needs to be placed in some type of facility.  Apparently, he does not have a good relationship with his family who is in [place].  She is hoping to speak to a Child psychotherapist to help with his social situation. Apparently, the patient has been to several doctors trying to find something that can be fixed.  He was hoping he would either receive a stent or need his aortic valve replaced.  He was very disappointed at the results we found.  The sister was not surprised at his reaction.  She stated that this had happened several times in the past.   ECHOCARDIOGRAM COMPLETE  Result Date: 11/16/2022    ECHOCARDIOGRAM REPORT   Patient Name:    Johnny [name] Date of Exam: 11/16/2022 Medical Rec #:  [id number]     Height:       67.0 in Accession #:    [id number]    Weight:       216.9 lb Date of Birth:  1945-12-27    BSA:          2.093 m Patient Age:    76 years      BP:           122/86 mmHg Patient Gender: M             HR:           60 bpm. Exam Location:  Inpatient Procedure: 2D Echo, Color Doppler, Cardiac Doppler and Intracardiac            Opacification Agent Indications:    Abnormal ECG  History:        Patient has prior history of Echocardiogram examinations, most                 recent 06/19/2022. CHF, CAD, Prior CABG and Pacemaker, AS,                 Arrythmias:Atrial Fibrillation and Bradycardia; Risk                 Factors:Sleep Apnea.  Sonographer:    Milda Smart Referring Phys: [id number] [facility] S KHAN  Sonographer Comments: Technically difficult study due to poor echo windows. Image acquisition challenging due to patient body habitus and Image acquisition challenging due to respiratory motion. IMPRESSIONS  1. Left ventricular ejection fraction, by estimation, is 65 to 70%. The left ventricle has normal function. The left ventricle has no regional wall motion abnormalities. There is moderate concentric left ventricular hypertrophy. Left ventricular diastolic parameters are consistent with Grade II diastolic dysfunction (pseudonormalization).  2. Right ventricular systolic function is mildly reduced. The right ventricular size is normal. There is normal pulmonary artery systolic pressure. The estimated right ventricular systolic pressure is 23.8 mmHg.  3. Left atrial size was mildly dilated.  4. The mitral valve is normal in structure. Trivial mitral valve regurgitation. No evidence of mitral stenosis.  5. The aortic valve is tricuspid. There is moderate calcification of the aortic valve. There is moderate thickening of the aortic valve. Aortic valve regurgitation is not visualized. Moderate aortic valve stenosis. Aortic valve Vmax  measures 3.21 m/s.  6. There is borderline dilatation of the ascending aorta, measuring 39 mm.  7. The inferior vena cava is normal in size with greater than 50% respiratory variability, suggesting right atrial pressure of 3 mmHg. Comparison(s): Prior images reviewed side by side. Aortic stenosis is slightly worse, but remains moderate. FINDINGS  Left Ventricle: Left ventricular ejection fraction, by estimation, is 65 to 70%. The left ventricle has normal function. The left ventricle has no regional wall motion abnormalities. Definity contrast agent was given IV to delineate the left ventricular  endocardial borders. The left ventricular internal cavity size was normal in size. There is moderate concentric left ventricular hypertrophy. Abnormal (paradoxical) septal motion consistent with post-operative status. Left ventricular diastolic parameters are consistent with Grade II diastolic dysfunction (pseudonormalization). Right Ventricle: The right ventricular size is normal. No increase in right ventricular wall thickness. Right ventricular systolic function is mildly reduced. There is normal pulmonary artery systolic pressure. The tricuspid regurgitant velocity is 2.28 m/s, and with an assumed right atrial  pressure of 3 mmHg, the estimated right ventricular systolic pressure is 23.8 mmHg. Left Atrium: Left atrial size was mildly dilated. Right Atrium: Right atrial size was normal in size. Pericardium: There is no evidence of pericardial effusion. Mitral Valve: The mitral valve is normal in structure. Mild mitral annular calcification. Trivial mitral valve regurgitation. No evidence of mitral valve stenosis. MV peak gradient, 1.4 mmHg. The mean mitral valve gradient is 1.0 mmHg. Tricuspid Valve: The tricuspid valve is normal in structure. Tricuspid valve regurgitation is not demonstrated. Aortic Valve: The aortic valve is tricuspid. There is moderate calcification of the aortic valve. There is moderate thickening of  the aortic valve. Aortic valve regurgitation is not visualized. Moderate aortic stenosis is present. Aortic valve mean gradient measures 23.0 mmHg. Aortic valve peak gradient measures 41.2 mmHg. Aortic valve area, by VTI measures 1.01 cm. Pulmonic Valve: The pulmonic valve was not well visualized. Pulmonic valve regurgitation is not visualized. No evidence of pulmonic stenosis. Aorta: The aortic root is normal in size and structure. There is borderline dilatation of the ascending aorta, measuring 39 mm. Venous: The inferior vena cava is normal in size with greater than 50% respiratory variability, suggesting right atrial pressure of 3 mmHg. IAS/Shunts: The interatrial septum was not well visualized. Additional Comments: A device lead is visualized in the right ventricle and right atrium.  LEFT VENTRICLE PLAX 2D LVIDd:         4.50 cm   Diastology LVIDs:         3.70 cm   LV e' medial:    4.14 cm/s LV PW:         1.40 cm   LV E/e' medial:  11.2 LV IVS:        1.30 cm   LV e' lateral:   6.57 cm/s LVOT diam:     2.00 cm   LV E/e' lateral: 7.0 LV SV:         62 LV SV Index:   30 LVOT Area:     3.14 cm  RIGHT VENTRICLE RV S prime:     7.38 cm/s TAPSE (M-mode): 1.6 cm LEFT ATRIUM             Index        RIGHT ATRIUM           Index LA diam:        4.10 cm 1.96 cm/m   RA Area:     16.20 cm LA Vol (A2C):   75.3 ml 35.97 ml/m  RA Volume:   38.40 ml  18.34 ml/m LA Vol (A4C):   62.7 ml 29.95 ml/m LA Biplane Vol: 72.3 ml 34.54 ml/m  AORTIC VALVE AV Area (Vmax):    0.94 cm AV Area (Vmean):   0.96 cm AV Area (VTI):     1.01 cm AV Vmax:           321.00 cm/s AV Vmean:          230.000 cm/s AV VTI:            0.613 m AV Peak Grad:      41.2 mmHg AV Mean Grad:      23.0 mmHg LVOT Vmax:         95.60 cm/s LVOT Vmean:        70.500 cm/s LVOT VTI:          0.198 m LVOT/AV VTI ratio: 0.32  AORTA Ao Root diam: 3.00 cm Ao Asc diam:  3.90 cm  MITRAL VALVE               TRICUSPID VALVE MV Area (PHT): 2.14 cm    TR Peak grad:    20.8 mmHg MV Area VTI:   2.80 cm    TR Mean grad:   13.0 mmHg MV Peak grad:  1.4 mmHg    TR Vmax:        228.00 cm/s MV Mean grad:  1.0 mmHg    TR Vmean:       171.0 cm/s MV Vmax:       0.60 m/s MV Vmean:      40.4 cm/s   SHUNTS MV Decel Time: 355 msec    Systemic VTI:  0.20 m MR Peak grad: 89.5 mmHg    Systemic Diam: 2.00 cm MR Vmax:      473.00 cm/s MV E velocity: 46.20 cm/s MV A velocity: 51.40 cm/s MV E/A ratio:  0.90 Mihai Croitoru MD Electronically signed by Thurmon Fair MD Signature Date/Time: 11/16/2022/4:07:12 PM    Final    DG Chest Portable 1 View  Result Date: 11/15/2022 CLINICAL DATA:  Chest pain EXAM: PORTABLE CHEST 1 VIEW COMPARISON:  X-ray 09/13/2022 and older FINDINGS: Status post median sternotomy. Two of the upper wires are fractured. Left upper chest pacemaker. Underinflation. Blunting of the left costophrenic angle. Tiny effusion versus pleural thickening. No pneumothorax. Stable cardiopericardial silhouette with slight central vascular congestion. No consolidation. Degenerative changes of the right shoulder. Left shoulder arthroplasty with coracoid screw. Overlapping cardiac leads. IMPRESSION: Postop chest with underinflation. Tiny left effusion versus pleural thickening. Central vascular congestion. Pacemaker. Electronically Signed   By: Karen Kays M.D.   On: 11/15/2022 18:41   EEG adult  Result Date: 11/10/2022 Windell Norfolk, MD     11/10/2022  4:29 PM History: 77 year old man with cognitive impairment EEG classification:  Awake and asleep Description of the recording: The background rhythms of this recording consists of low amplitude, symmetric background activity. As the record progresses, the patient initially is in the waking state, but appears to enter the early stage II sleep during the recording, with rudimentary sleep spindles and vertex sharp wave activity seen. During the wakeful state, photic stimulation is performed, and no abnormal responses were seen.  Hyperventilation was also performed, no abnormal response seen. No epileptiform discharges seen during this recording. There was no focal slowing. Abnormality: Mild diffuse slowing Impression: This is an abnormal EEG recording in the waking and sleeping state due to diffuse background slowing. This is consistent with a generalized brain dysfunction, nonspecific. Windell Norfolk, MD [facility]   Disposition   Pt is being discharged home today in good condition.  Follow-up Plans & Appointments     Follow-up Information     [place] Follow up.   Why: Please establish with local PCP, cardiologist, sleep specialist to manage BiPAP and social worker.               Discharge Instructions     AMB referral to Phase II Cardiac Rehabilitation   Complete by: As directed    Graft failure   Diagnosis: Other   After initial evaluation and assessments completed: Virtual Based Care may be provided alone or in conjunction with Phase 2 Cardiac Rehab based on patient barriers.: Yes   Intensive Cardiac Rehabilitation (ICR) [facility] location only OR Traditional Cardiac Rehabilitation (TCR) *If criteria for ICR are not met will enroll in TCR [facility] only): Yes  Discharge Medications   Allergies as of 11/18/2022       Reactions   Acacia Itching   Metformin And Related Other (See Comments)   Metformin caused the patient's legs to become weak        Medication List     STOP taking these medications    isosorbide mononitrate 30 MG 24 hr tablet Commonly known as: IMDUR   lisinopril 2.5 MG tablet Commonly known as: ZESTRIL       TAKE these medications    albuterol 108 (90 Base) MCG/ACT inhaler Commonly known as: VENTOLIN HFA Inhale 1 puff into the lungs every 6 (six) hours as needed for wheezing or shortness of breath.   carvedilol 6.25 MG tablet Commonly known as: COREG TAKE 1 TABLET BY MOUTH TWICE  DAILY   cyanocobalamin 1000 MCG tablet Commonly known as:  VITAMIN B12 Take 1 tablet (1,000 mcg total) by mouth daily.   Eliquis 5 MG Tabs tablet Generic drug: apixaban TAKE 1 TABLET BY MOUTH TWICE  DAILY   famotidine 40 MG tablet Commonly known as: PEPCID Take 40 mg by mouth daily.   fluticasone 50 MCG/ACT nasal spray Commonly known as: FLONASE Place 2 sprays into both nostrils 3 (three) times daily as needed for allergies or rhinitis.   furosemide 40 MG tablet Commonly known as: LASIX Take 1 tablet (40 mg total) by mouth as needed for edema. What changed:  when to take this reasons to take this   loratadine 10 MG tablet Commonly known as: CLARITIN Take 1 tablet (10 mg total) by mouth daily.   montelukast 10 MG tablet Commonly known as: SINGULAIR Take 10 mg by mouth daily as needed (for allergies).   nitroGLYCERIN 0.4 MG SL tablet Commonly known as: NITROSTAT Place 1 tablet (0.4 mg total) under the tongue every 5 (five) minutes as needed for chest pain.   pantoprazole 40 MG tablet Commonly known as: PROTONIX Take 1 tablet (40 mg total) by mouth daily before breakfast.   rosuvastatin 40 MG tablet Commonly known as: CRESTOR TAKE 1 TABLET BY MOUTH DAILY   sodium chloride 0.65 % Soln nasal spray Commonly known as: OCEAN Place 1 spray into both nostrils as needed for congestion.   Tylenol 8 Hour Arthritis Pain 650 MG CR tablet Generic drug: acetaminophen Take 650-1,300 mg by mouth every 8 (eight) hours as needed for pain.   venlafaxine XR 150 MG 24 hr capsule Commonly known as: EFFEXOR-XR Take 1 capsule (150 mg total) by mouth daily with breakfast.               Durable Medical Equipment  (From admission, onward)           Start     Ordered   11/18/22 1508  For home use only DME Walker rolling  Once       Question Answer Comment  Walker: With 5 Inch Wheels   Patient needs a walker to treat with the following condition General weakness      11/18/22 1508               Outstanding Labs/Studies    N/A  Duration of Discharge Encounter   Greater than 30 minutes including physician time.  Ramond Dial, PA 11/18/2022, 4:33 PM

## 2022-11-18 NOTE — Evaluation (Signed)
Physical Therapy Evaluation Patient Details Name: Johnny [name] MRN: [id number] DOB: 12-30-1945 Today's Date: 11/18/2022  History of Present Illness  Pt is a 77 year old male admitted on 11/15/22 for chest pain. hx of CAD status post CABG in 2015, moderate aortic stenosis, diastolic heart failure, OSA, atrial fibrillation, symptomatic bradycardia status post PPM.  Clinical Impression  Pt presents with admitting diagnosis above. Pt was able to ambulate in hallway today with RW at min guard level and complete bed mobility/transfers independently. During ambulation pt reported constant dizziness despite vitals signs being stable and no LOB or nystagmus noted. Reached out to colleague who will perform Vestibular evaluation later today. Anticipate that pt will be able to return home with HHPT and a RW. Pt is also requesting for social work to assist him with finding an ALF. PT will continue to follow.     Recommendations for follow up therapy are one component of a multi-disciplinary discharge planning process, led by the attending physician.  Recommendations may be updated based on patient status, additional functional criteria and insurance authorization.  Follow Up Recommendations       Assistance Recommended at Discharge Set up Supervision/Assistance  Patient can return home with the following  A little help with walking and/or transfers;A little help with bathing/dressing/bathroom;Assistance with cooking/housework;Direct supervision/assist for medications management;Assist for transportation;Help with stairs or ramp for entrance    Equipment Recommendations Rolling walker (2 wheels)  Recommendations for Other Services       Functional Status Assessment Patient has had a recent decline in their functional status and demonstrates the ability to make significant improvements in function in a reasonable and predictable amount of time.     Precautions / Restrictions Precautions Precautions:  Fall Restrictions Weight Bearing Restrictions: No      Mobility  Bed Mobility Overal bed mobility: Modified Independent                  Transfers Overall transfer level: Independent Equipment used: None                    Ambulation/Gait Ambulation/Gait assistance: Land (Feet): 100 Feet Assistive device: Rolling walker (2 wheels) Gait Pattern/deviations: Decreased stride length, Step-through pattern Gait velocity: decreased     General Gait Details: Pt reported constant dizziness with ambulation however no LOB.  Stairs            Wheelchair Mobility    Modified Rankin (Stroke Patients Only)       Balance Overall balance assessment: Mild deficits observed, not formally tested                                           Pertinent Vitals/Pain Pain Assessment Pain Assessment: No/denies pain    Home Living Family/patient expects to be discharged to:: Private residence Living Arrangements: Alone Available Help at Discharge: Friend(s);Available PRN/intermittently Type of Home: Apartment Home Access: Stairs to enter Entrance Stairs-Rails: Left Entrance Stairs-Number of Steps: 6   Home Layout: One level Home Equipment: None      Prior Function Prior Level of Function : Independent/Modified Independent;Driving;History of Falls (last six months) (3 falls within last 6 months. Pt reports no dizziness or blacking out but just tripping over objects in home)             Mobility Comments: Ind no AD ADLs Comments: Pt reports Ind  but slowly     Hand Dominance   Dominant Hand: Right    Extremity/Trunk Assessment   Upper Extremity Assessment Upper Extremity Assessment: Overall WFL for tasks assessed    Lower Extremity Assessment Lower Extremity Assessment: Overall WFL for tasks assessed    Cervical / Trunk Assessment Cervical / Trunk Assessment: Kyphotic  Communication   Communication: No  difficulties  Cognition Arousal/Alertness: Awake/alert Behavior During Therapy: WFL for tasks assessed/performed Overall Cognitive Status: Within Functional Limits for tasks assessed                                          General Comments General comments (skin integrity, edema, etc.): VSS on 2L.    Exercises     Assessment/Plan    PT Assessment Patient needs continued PT services  PT Problem List Decreased strength;Decreased activity tolerance;Decreased mobility;Decreased balance;Decreased coordination;Decreased knowledge of use of DME;Decreased safety awareness;Cardiopulmonary status limiting activity       PT Treatment Interventions DME instruction;Gait training;Stair training;Functional mobility training;Therapeutic activities;Therapeutic exercise;Balance training;Neuromuscular re-education;Patient/family education    PT Goals (Current goals can be found in the Care Plan section)  Acute Rehab PT Goals Patient Stated Goal: To get better PT Goal Formulation: With patient Time For Goal Achievement: 12/02/22 Potential to Achieve Goals: Good    Frequency Min 1X/week     Co-evaluation               AM-PAC PT "6 Clicks" Mobility  Outcome Measure Help needed turning from your back to your side while in a flat bed without using bedrails?: None Help needed moving from lying on your back to sitting on the side of a flat bed without using bedrails?: None Help needed moving to and from a bed to a chair (including a wheelchair)?: None Help needed standing up from a chair using your arms (e.g., wheelchair or bedside chair)?: None Help needed to walk in hospital room?: A Little Help needed climbing 3-5 steps with a railing? : A Little 6 Click Score: 22    End of Session Equipment Utilized During Treatment: Gait belt;Oxygen Activity Tolerance: Patient tolerated treatment well Patient left: in bed;with call bell/phone within reach   PT Visit Diagnosis:  Other abnormalities of gait and mobility (R26.89);Dizziness and giddiness (R42)    Time: 4098-1191 PT Time Calculation (min) (ACUTE ONLY): 30 min   Charges:   PT Evaluation $PT Eval Low Complexity: 1 Low PT Treatments $Gait Training: 8-22 mins        Johnny [name], PT, DPT Acute Rehab Services [phone]   Johnny [name] 11/18/2022, 12:38 PM

## 2022-11-18 NOTE — TOC Initial Note (Addendum)
Transition of Care [facility]) - Initial/Assessment Note    Patient Details  Name: Johnny [name] MRN: [id number] Date of Birth: [birth year]  Transition of Care [facility]) CM/SW Contact:    Gala Lewandowsky, RN Phone Number: 11/18/2022, 3:16 PM  Clinical Narrative:  Patient presented for chest pain. Case Manager spoke with patient regarding disposition needs. Patient was currently renting a townhouse from his friend Elease Hashimoto. Patient states that he wants to go to a facility in [place]. PT did not recommend SNF-patient will go home this evening. Elease Hashimoto landlord is willing to have the patient return home this evening and then patient has to drive to [place] on Saturday. Case Manager called his sister with the plan and she is agreeable to accept the patient; however, he will need to look for housing in [place] and apply for Medicaid. Sister is speaking with two of the patients friends to see if they can house him in [place]. No HH will be arranged since it will be outside of the service area and he will need to find a PCP once he arrives. CSW working with patient to provide a cab voucher to home. Rolling walker ordered and will be delivered to the room via [employer].               1600 11-18-22 Case Manager received a call from [employer] and they state patient still owes them for oxygen-unable to use his credit card to place on file. Patient declined the rolling walker. No further needs identified.    Expected Discharge Plan: Home/Self Care Barriers to Discharge: No Barriers Identified   Patient Goals and CMS Choice Patient states their goals for this hospitalization and ongoing recovery are:: patient is returning home and then driving to [place]; sister will assist from [facility]   Choice offered to / list presented to : NA     Expected Discharge Plan and Services In-house Referral: Clinical Social Work Discharge Planning Services: CM Consult Post Acute Care Choice: NA Living arrangements for the past 2 months:   (renting a townhome.)                 DME Arranged: Walker rolling DME Agency: [employer] Date DME Agency Contacted: 11/18/22 Time DME Agency Contacted: 1515 Representative spoke with at DME Agency: Barbara Cower  Prior Living Arrangements/Services Living arrangements for the past 2 months:  (renting a townhome.) Lives with:: Friends (landlord) Patient language and need for interpreter reviewed:: Yes Do you feel safe going back to the place where you live?: Yes      Need for Family Participation in Patient Care: No (Comment) Care giver support system in place?: No (comment)  Activities of Daily Living Home Assistive Devices/Equipment: Blood pressure cuff, CBG Meter ADL Screening (condition at time of admission) Patient's cognitive ability adequate to safely complete daily activities?: Yes Is the patient deaf or have difficulty hearing?: No Does the patient have difficulty seeing, even when wearing glasses/contacts?: No Does the patient have difficulty concentrating, remembering, or making decisions?: No Patient able to express need for assistance with ADLs?: Yes Does the patient have difficulty dressing or bathing?: No Independently performs ADLs?: Yes (appropriate for developmental age) Does the patient have difficulty walking or climbing stairs?: No Weakness of Legs: None Weakness of Arms/Hands: None  Permission Sought/Granted Permission sought to share information with : Family Supports, Case Manager   Emotional Assessment Appearance:: Appears stated age Attitude/Demeanor/Rapport: Engaged Affect (typically observed): Appropriate Orientation: : Oriented to Self, Oriented to Place, Oriented to  Time  Alcohol / Substance Use: Not Applicable Psych Involvement: No (comment)  Admission diagnosis:  NSTEMI (non-ST elevated myocardial infarction) [I21.4] Chest pain, unspecified type [R07.9] Patient Active Problem List   Diagnosis Date Noted   Elevated troponin 11/17/2022   NSTEMI  (non-ST elevated myocardial infarction) 11/15/2022   Acquired thrombophilia 10/10/2022   Atherosclerotic heart disease of native coronary artery with other forms of angina pectoris 10/10/2022   Chronic heart failure with preserved ejection fraction (HFpEF) 06/19/2022   AKI (acute kidney injury) 12/14/2021   Essential hypertension 12/14/2021   Aortic stenosis 12/14/2021   Chest pain 12/14/2021   Chronic respiratory failure with hypoxia 12/14/2021   Symptomatic bradycardia 12/14/2021   PAD (peripheral artery disease) 12/14/2021   Excessive daytime sleepiness 12/14/2021   DM type 2 with diabetic peripheral neuropathy 10/20/2021   Stage 3b chronic kidney disease 10/20/2021   PAF (paroxysmal atrial fibrillation) 04/12/2021   OSA (obstructive sleep apnea) 09/15/2020   Mixed hyperlipidemia 09/06/2020   Chronic diastolic HF (heart failure) 09/06/2020   Anxiety with depression 09/06/2020   Coronary artery disease involving native coronary artery of native heart 09/06/2020   PCP:  Caesar Bookman, NP Pharmacy:   [id number] - [place], [place] - [address] [place] [address] Phone: [phone] Fax: [phone]  [facility] - [place], [place] - [address] [place] [address] Phone: [phone] Fax: [phone]  Social Determinants of Health (SDOH) Social History: SDOH Screenings   Food Insecurity: No Food Insecurity (11/17/2022)  Housing: Low Risk  (11/17/2022)  Transportation Needs: No Transportation Needs (11/17/2022)  Utilities: Not At Risk (11/17/2022)  Depression (PHQ2-9): Low Risk  (10/20/2021)  Tobacco Use: Medium Risk (11/18/2022)   Readmission Risk Interventions     No data to display

## 2022-11-18 NOTE — Care Management Important Message (Signed)
Important Message  Patient Details  Name: Johnny [name] MRN: [id number] Date of Birth: May 23, 1946   Medicare Important Message Given:  Yes     Renie Ora 11/18/2022, 9:29 AM

## 2022-11-18 NOTE — Progress Notes (Signed)
Mobility Specialist Progress Note:   11/18/22 1600  Mobility  Activity Ambulated with assistance to bathroom  Level of Assistance Standby assist, set-up cues, supervision of patient - no hands on  Assistive Device Front wheel walker  Distance Ambulated (ft) 30 ft  Activity Response Tolerated well  Mobility Referral Yes  $Mobility charge 1 Mobility   Pt requesting to go to [facility]. Required no physical assistance throughout ambulation. Pt back in bed with all needs met. Not looking forward to d/c.  Addison Lank Mobility Specialist Please contact via SecureChat or  Rehab office at [phone]

## 2022-11-18 NOTE — Progress Notes (Signed)
Rounding Note    Patient Name: Johnny [name] Date of Encounter: 11/18/2022  [facility] Cardiologist: Little Ishikawa, MD   Subjective   Still has mild chest discomfort and SOB with walking. Last time he was able to walk a block was last year. "I have been declining since last year." Reportedly lived in [place] for over 20 years, has a cardiologist there. Currently renting a home, but has problem climbing stairs. Interested in low income assisted home.   Inpatient Medications    Scheduled Meds:  aspirin EC  81 mg Oral Daily   atorvastatin  80 mg Oral Daily   carvedilol  6.25 mg Oral BID   cyanocobalamin  1,000 mcg Oral Daily   famotidine  40 mg Oral Daily   heparin  5,000 Units Subcutaneous Q8H   loratadine  10 mg Oral Daily   pantoprazole  40 mg Oral QAC breakfast   sodium chloride flush  3 mL Intravenous Q12H   venlafaxine XR  150 mg Oral Q breakfast   Continuous Infusions:  sodium chloride 75 mL/hr at 11/17/22 0511   sodium chloride     nitroGLYCERIN Stopped (11/17/22 1512)   PRN Meds: sodium chloride, acetaminophen, albuterol, fluticasone, montelukast, nitroGLYCERIN, ondansetron (ZOFRAN) IV, sodium chloride, sodium chloride flush   Vital Signs    Vitals:   11/17/22 1618 11/17/22 1951 11/17/22 2325 11/18/22 0328  BP: (!) 138/95 110/76 107/88 (!) 142/90  Pulse:  61 99 60  Resp:  20 20 17   Temp:  97.8 F (36.6 C) 98.1 F (36.7 C) 97.7 F (36.5 C)  TempSrc:  Axillary Oral Oral  SpO2:  94% 95% 98%  Weight:      Height:        Intake/Output Summary (Last 24 hours) at 11/18/2022 0619 Last data filed at 11/18/2022 0334 Gross per 24 hour  Intake 1358.37 ml  Output 950 ml  Net 408.37 ml      11/16/2022    4:00 PM 11/15/2022   11:18 PM 11/15/2022    5:47 PM  Last 3 Weights  Weight (lbs) 204 lb 12.9 oz 216 lb 14.9 oz 217 lb  Weight (kg) 92.9 kg 98.4 kg 98.431 kg      Telemetry    Paced rhythm - Personally Reviewed  ECG    Paced  rhythm - Personally Reviewed  Physical Exam   GEN: No acute distress.   Neck: No JVD Cardiac: RRR, no murmurs, rubs, or gallops.  Respiratory: Clear to auscultation bilaterally. GI: Soft, nontender, non-distended  MS: No edema; No deformity. Neuro:  Nonfocal  Psych: Normal affect   Labs    High Sensitivity Troponin:   Recent Labs  Lab 11/15/22 1816 11/15/22 1950 11/16/22 1124 11/16/22 1435  TROPONINIHS 27* 27* 24* 22*     Chemistry Recent Labs  Lab 11/15/22 1816 11/16/22 1435 11/17/22 0218  NA 137 136 138  K 4.0 4.4 3.8  CL 99 96* 98  CO2 26 26 28   GLUCOSE 86 103* 105*  BUN 27* 29* 28*  CREATININE 2.02* 1.83* 1.76*  CALCIUM 10.4* 10.7* 10.2  MG 1.9  --   --   PROT 7.1  --   --   ALBUMIN 3.8  --   --   AST 29  --   --   ALT 30  --   --   ALKPHOS 65  --   --   BILITOT 0.7  --   --   GFRNONAA 34* 38* 40*  ANIONGAP 12 14 12     Lipids No results for input(s): "CHOL", "TRIG", "HDL", "LABVLDL", "LDLCALC", "CHOLHDL" in the last 168 hours.  Hematology Recent Labs  Lab 11/15/22 1816 11/17/22 0218 11/18/22 0225  WBC 7.8 5.7 6.3  RBC 5.13 5.40 4.95  HGB 14.8 15.4 14.2  HCT 45.8 46.6 43.6  MCV 89.3 86.3 88.1  MCH 28.8 28.5 28.7  MCHC 32.3 33.0 32.6  RDW 17.6* 17.3* 17.6*  PLT 228 187 175   Thyroid No results for input(s): "TSH", "FREET4" in the last 168 hours.  BNP Recent Labs  Lab 11/15/22 1800  BNP 75.9    DDimer No results for input(s): "DDIMER" in the last 168 hours.   Radiology    CARDIAC CATHETERIZATION  Result Date: 11/17/2022   Prox RCA to Mid RCA lesion is 10% stenosed. Patent stents.   SVG to OM graft is occluded.  Native proximal to mid circumflex is widely patent.   1st Mrg lesion is 25% stenosed.   Dist Cx lesion is 100% stenosed.  Right to left and left to left collaterals.   LIMA to LAD is small and fills slowly but appears to be patent.  Distal LAD is very small.   Previously placed Mid Cx to Dist Cx stent of unknown type is  widely  patent.   LV end diastolic pressure is mildly elevated.   There is mild aortic valve stenosis. Appears to have adequate circulation to all three coronary distributions.   Chronically occluded distal circumflex is not a candidate for CTO PCI.  No target for PCI.  Continue aggressive medical therapy. Results conveyed to the patient's daughter lives in [place].  She is most concerned about his social situation.  She notes that he lives with a roommate who is not a family member and is not a caretaker.  He needs more support and she thinks he needs to be placed in some type of facility.  Apparently, he does not have a good relationship with his family who is in [place].  She is hoping to speak to a Child psychotherapist to help with his social situation. Apparently, the patient has been to several doctors trying to find something that can be fixed.  He was hoping he would either receive a stent or need his aortic valve replaced.  He was very disappointed at the results we found.  The sister was not surprised at his reaction.  She stated that this had happened several times in the past.   ECHOCARDIOGRAM COMPLETE  Result Date: 11/16/2022    ECHOCARDIOGRAM REPORT   Patient Name:   ALP GOLDWATER Date of Exam: 11/16/2022 Medical Rec #:  [id number]     Height:       67.0 in Accession #:    [id number]    Weight:       216.9 lb Date of Birth:  1945/11/28    BSA:          2.093 m Patient Age:    77 years      BP:           122/86 mmHg Patient Gender: M             HR:           60 bpm. Exam Location:  Inpatient Procedure: 2D Echo, Color Doppler, Cardiac Doppler and Intracardiac            Opacification Agent Indications:    Abnormal ECG  History:  Patient has prior history of Echocardiogram examinations, most                 recent 06/19/2022. CHF, CAD, Prior CABG and Pacemaker, AS,                 Arrythmias:Atrial Fibrillation and Bradycardia; Risk                 Factors:Sleep Apnea.  Sonographer:    Milda Smart  Referring Phys: [id number] [facility] KHAN  Sonographer Comments: Technically difficult study due to poor echo windows. Image acquisition challenging due to patient body habitus and Image acquisition challenging due to respiratory motion. IMPRESSIONS  1. Left ventricular ejection fraction, by estimation, is 65 to 70%. The left ventricle has normal function. The left ventricle has no regional wall motion abnormalities. There is moderate concentric left ventricular hypertrophy. Left ventricular diastolic parameters are consistent with Grade II diastolic dysfunction (pseudonormalization).  2. Right ventricular systolic function is mildly reduced. The right ventricular size is normal. There is normal pulmonary artery systolic pressure. The estimated right ventricular systolic pressure is 23.8 mmHg.  3. Left atrial size was mildly dilated.  4. The mitral valve is normal in structure. Trivial mitral valve regurgitation. No evidence of mitral stenosis.  5. The aortic valve is tricuspid. There is moderate calcification of the aortic valve. There is moderate thickening of the aortic valve. Aortic valve regurgitation is not visualized. Moderate aortic valve stenosis. Aortic valve Vmax measures 3.21 m/s.  6. There is borderline dilatation of the ascending aorta, measuring 39 mm.  7. The inferior vena cava is normal in size with greater than 50% respiratory variability, suggesting right atrial pressure of 3 mmHg. Comparison(s): Prior images reviewed side by side. Aortic stenosis is slightly worse, but remains moderate. FINDINGS  Left Ventricle: Left ventricular ejection fraction, by estimation, is 65 to 70%. The left ventricle has normal function. The left ventricle has no regional wall motion abnormalities. Definity contrast agent was given IV to delineate the left ventricular  endocardial borders. The left ventricular internal cavity size was normal in size. There is moderate concentric left ventricular hypertrophy. Abnormal  (paradoxical) septal motion consistent with post-operative status. Left ventricular diastolic parameters are consistent with Grade II diastolic dysfunction (pseudonormalization). Right Ventricle: The right ventricular size is normal. No increase in right ventricular wall thickness. Right ventricular systolic function is mildly reduced. There is normal pulmonary artery systolic pressure. The tricuspid regurgitant velocity is 2.28 m/s, and with an assumed right atrial pressure of 3 mmHg, the estimated right ventricular systolic pressure is 23.8 mmHg. Left Atrium: Left atrial size was mildly dilated. Right Atrium: Right atrial size was normal in size. Pericardium: There is no evidence of pericardial effusion. Mitral Valve: The mitral valve is normal in structure. Mild mitral annular calcification. Trivial mitral valve regurgitation. No evidence of mitral valve stenosis. MV peak gradient, 1.4 mmHg. The mean mitral valve gradient is 1.0 mmHg. Tricuspid Valve: The tricuspid valve is normal in structure. Tricuspid valve regurgitation is not demonstrated. Aortic Valve: The aortic valve is tricuspid. There is moderate calcification of the aortic valve. There is moderate thickening of the aortic valve. Aortic valve regurgitation is not visualized. Moderate aortic stenosis is present. Aortic valve mean gradient measures 23.0 mmHg. Aortic valve peak gradient measures 41.2 mmHg. Aortic valve area, by VTI measures 1.01 cm. Pulmonic Valve: The pulmonic valve was not well visualized. Pulmonic valve regurgitation is not visualized. No evidence of pulmonic stenosis. Aorta: The aortic root is  normal in size and structure. There is borderline dilatation of the ascending aorta, measuring 39 mm. Venous: The inferior vena cava is normal in size with greater than 50% respiratory variability, suggesting right atrial pressure of 3 mmHg. IAS/Shunts: The interatrial septum was not well visualized. Additional Comments: A device lead is  visualized in the right ventricle and right atrium.  LEFT VENTRICLE PLAX 2D LVIDd:         4.50 cm   Diastology LVIDs:         3.70 cm   LV e' medial:    4.14 cm/s LV PW:         1.40 cm   LV E/e' medial:  11.2 LV IVS:        1.30 cm   LV e' lateral:   6.57 cm/s LVOT diam:     2.00 cm   LV E/e' lateral: 7.0 LV SV:         62 LV SV Index:   30 LVOT Area:     3.14 cm  RIGHT VENTRICLE RV S prime:     7.38 cm/s TAPSE (M-mode): 1.6 cm LEFT ATRIUM             Index        RIGHT ATRIUM           Index LA diam:        4.10 cm 1.96 cm/m   RA Area:     16.20 cm LA Vol (A2C):   75.3 ml 35.97 ml/m  RA Volume:   38.40 ml  18.34 ml/m LA Vol (A4C):   62.7 ml 29.95 ml/m LA Biplane Vol: 72.3 ml 34.54 ml/m  AORTIC VALVE AV Area (Vmax):    0.94 cm AV Area (Vmean):   0.96 cm AV Area (VTI):     1.01 cm AV Vmax:           321.00 cm/s AV Vmean:          230.000 cm/s AV VTI:            0.613 m AV Peak Grad:      41.2 mmHg AV Mean Grad:      23.0 mmHg LVOT Vmax:         95.60 cm/s LVOT Vmean:        70.500 cm/s LVOT VTI:          0.198 m LVOT/AV VTI ratio: 0.32  AORTA Ao Root diam: 3.00 cm Ao Asc diam:  3.90 cm MITRAL VALVE               TRICUSPID VALVE MV Area (PHT): 2.14 cm    TR Peak grad:   20.8 mmHg MV Area VTI:   2.80 cm    TR Mean grad:   13.0 mmHg MV Peak grad:  1.4 mmHg    TR Vmax:        228.00 cm/s MV Mean grad:  1.0 mmHg    TR Vmean:       171.0 cm/s MV Vmax:       0.60 m/s MV Vmean:      40.4 cm/s   SHUNTS MV Decel Time: 355 msec    Systemic VTI:  0.20 m MR Peak grad: 89.5 mmHg    Systemic Diam: 2.00 cm MR Vmax:      473.00 cm/s MV E velocity: 46.20 cm/s MV A velocity: 51.40 cm/s MV E/A ratio:  0.90 Mihai Croitoru MD Electronically signed by Thurmon Fair MD Signature  Date/Time: 11/16/2022/4:07:12 PM    Final     Cardiac Studies   Echo 11/16/2022  1. Left ventricular ejection fraction, by estimation, is 65 to 70%. The left ventricle has normal function. The left ventricle has no regional wall motion  abnormalities. There is moderate concentric left ventricular hypertrophy. Left ventricular diastolic parameters are consistent with Grade II diastolic dysfunction (pseudonormalization).   2. Right ventricular systolic function is mildly reduced. The right ventricular size is normal. There is normal pulmonary artery systolic pressure. The estimated right ventricular systolic pressure is 23.8 mmHg.   3. Left atrial size was mildly dilated.   4. The mitral valve is normal in structure. Trivial mitral valve regurgitation. No evidence of mitral stenosis.   5. The aortic valve is tricuspid. There is moderate calcification of the aortic valve. There is moderate thickening of the aortic valve. Aortic valve regurgitation is not visualized. Moderate aortic valve stenosis. Aortic valve Vmax measures 3.21 m/s.   6. There is borderline dilatation of the ascending aorta, measuring 39 mm.   7. The inferior vena cava is normal in size with greater than 50% respiratory variability, suggesting right atrial pressure of 3 mmHg.   Comparison(s): Prior images reviewed side by side. Aortic stenosis is  slightly worse, but remains moderate.     Cath 11/17/2022   Prox RCA to Mid RCA lesion is 10% stenosed. Patent stents.   SVG to OM graft is occluded.  Native proximal to mid circumflex is widely patent.   1st Mrg lesion is 25% stenosed.   Dist Cx lesion is 100% stenosed.  Right to left and left to left collaterals.   LIMA to LAD is small and fills slowly but appears to be patent.  Distal LAD is very small.   Previously placed Mid Cx to Dist Cx stent of unknown type is  widely patent.   LV end diastolic pressure is mildly elevated.   There is mild aortic valve stenosis.   Appears to have adequate circulation to all three coronary distributions.   Chronically occluded distal circumflex is not a candidate for CTO PCI.  No target for PCI.  Continue aggressive medical therapy.   Results conveyed to the patient's daughter  lives in [place].  She is most concerned about his social situation.  She notes that he lives with a roommate who is not a family member and is not a caretaker.  He needs more support and she thinks he needs to be placed in some type of facility.  Apparently, he does not have a good relationship with his family who is in [place].  She is hoping to speak to a Child psychotherapist to help with his social situation.   Apparently, the patient has been to several doctors trying to find something that can be fixed.  He was hoping he would either receive a stent or need his aortic valve replaced.  He was very disappointed at the results we found.  The sister was not surprised at his reaction.  She stated that this had happened several times in the past.    Patient Profile     77 y.o. male with PMH of CAD s/p CABG 2015, moderate AS, chronic diastolic CHF, OSA, afib and symptomatic bradycardia s/p PPM who presented with chest pain   Assessment & Plan    Chest pain             - Cath 06/07/2021 normal LM with severe mid LAD lesion, patent LIMA to  LAD with slow flow in the distal vessel from possibly filling defect vs streaming, distal vessel too small for PCI. Patent stent in OM2. Distal LCx is chronically occluded with R to L collaterals. Sequential SVG to two OM branches chronically occluded. Patent stent in prox RCA.              - Myoview 11/2021 negative. EF normal on echo 06/2022. Recently seen due to worsening DOE, was planned to undergo PET stress. Came in with chest pain with EKG changes             - Echo 11/16/2022 EF 65-70%, grade 2 DD, no RWMA, moderate LVH, RVSP 23.8 mmHg, moderate AS.             - Cath 11/17/2022 patent stent in RCA, occluded SVG-OM, patent prox to mid LCx, 25% OM1, 100% distal LCx to R to L and L to L collaterals (not a candidate for CTO PCI), LIMA-LAD is small and slow filling but patent, patent mid to distal LCx stent.   - PT/OT/social worker to see today, stable for DC from  medical perspective, however social issue complicate care.    CAD s/p CABG 2015   Moderate AS: mean aortic valve gradient 17 mmHg   Atrial fibrillation: Eliquis on hold for cath, resume today   OSA: severe OSA on sleep study 12/2021, need BiPAP but not started.    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 Pending BMET    For questions or updates, please contact [facility] Please consult [email or web address] for contact info under        Signed, Azalee Course, PA  11/18/2022, 6:19 AM

## 2022-11-18 NOTE — Progress Notes (Addendum)
CSW received consult for medicaid resources,ALF resources, and transportation assistance when patient medically ready for dc.  CSW spoke with patient at bedside. CSW provided patient with ALF resources and Medicaid resources. Patient accepted. All questions answered. No further questions reported at this time.CSW will provide taxi voucher to patients RN for patient when patient is medically ready for dc.

## 2022-11-18 NOTE — Telephone Encounter (Signed)
Patient is returning call and is requesting a return call.  
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
## 2022-11-19 ENCOUNTER — Other Ambulatory Visit: Payer: Self-pay | Admitting: Family

## 2022-11-21 ENCOUNTER — Telehealth: Payer: Self-pay

## 2022-11-21 ENCOUNTER — Other Ambulatory Visit: Payer: Medicare Other

## 2022-11-21 DIAGNOSIS — I5032 Chronic diastolic (congestive) heart failure: Secondary | ICD-10-CM

## 2022-11-21 DIAGNOSIS — U071 COVID-19: Secondary | ICD-10-CM | POA: Diagnosis not present

## 2022-11-21 NOTE — Transitions of Care (Post Inpatient/ED Visit) (Signed)
   11/21/2022  Name: Johnny [name] MRN: [id number] DOB: 1945/09/01  Today's TOC FU Call Status: Today's TOC FU Call Status:: Unsuccessul Call (1st Attempt) Unsuccessful Call (1st Attempt) Date: 11/21/22  Attempted to reach the patient regarding the most recent Inpatient/ED visit.  Follow Up Plan: Additional outreach attempts will be made to reach the patient to complete the Transitions of Care (Post Inpatient/ED visit) call.     Antionette Fairy, RN,BSN,CCM [facility] Care Management Care Management Community Coordinator Direct Phone: [phone] Toll Free: [phone] Fax: [phone]

## 2022-11-21 NOTE — Transitions of Care (Post Inpatient/ED Visit) (Signed)
   11/21/2022  Name: Johnny [name] MRN: [id number] DOB: 1945/11/23  Today's TOC FU Call Status: Today's TOC FU Call Status:: Successful TOC FU Call Competed TOC FU Call Complete Date: 11/21/22 (Incoming call from patient returning RN CM call.)  Transition Care Management Follow-up Telephone Call Date of Discharge: 11/18/22 Discharge Facility: [facility]) Type of Discharge: Inpatient Admission Primary Inpatient Discharge Diagnosis:: 'chest pain,unspecified" How have you been since you were released from the hospital?: Same (brief call with pt-he did nto feel up to talking long. Pt states he is not feeling well today. He "had some chest pain earlier today while up moving around-took a Nitro and put on oxygen and got some relief.") Any questions or concerns?: No  Items Reviewed: Did you receive and understand the discharge instructions provided?: Yes Medications obtained and verified?: No (pt did not feel up to med review-voiced he has all meds) Any new allergies since your discharge?: No Dietary orders reviewed?: NA Do you have support at home?: Yes People in Home: alone Name of Support/Comfort Primary Source: pt states his landlord 'checks on him frequently and he can call her if needed"  Home Care and Equipment/Supplies: Were Home Health Services Ordered?: NA Any new equipment or medical supplies ordered?: NA  Functional Questionnaire: Do you need assistance with bathing/showering or dressing?: No Do you need assistance with meal preparation?: No Do you need assistance with eating?: No Do you have difficulty maintaining continence: No Do you need assistance with getting out of bed/getting out of a chair/moving?: No Do you have difficulty managing or taking your medications?: No  Follow up appointments reviewed: PCP Follow-up appointment confirmed?: No (Pt did not want to make follow up appt-states he is moving to "either [place] or [facility] as soon as his housing is  arrangements are finalized) MD Provider Line Number:[phone] Given: No Specialist Hospital Follow-up appointment confirmed?: No Reason Specialist Follow-Up Not Confirmed: Patient has Specialist Provider Number and will Call for Appointment Do you need transportation to your follow-up appointment?: No Do you understand care options if your condition(s) worsen?: Yes-patient verbalized understanding (reviewed with pt mgmt of chest pain-taking Nitro and when to call/seek medical attention-)   TOC Interventions Today    Flowsheet Row Most Recent Value  TOC Interventions   TOC Interventions Discussed/Reviewed TOC Interventions Discussed, Post discharge activity limitations per provider      Interventions Today    Flowsheet Row Most Recent Value  General Interventions   General Interventions Discussed/Reviewed General Interventions Discussed, Doctor Visits  Doctor Visits Discussed/Reviewed Doctor Visits Discussed, PCP, Specialist  PCP/Specialist Visits Compliance with follow-up visit  Education Interventions   Education Provided Provided Education  Provided Verbal Education On When to see the doctor, Medication  Pharmacy Interventions   Pharmacy Dicussed/Reviewed Pharmacy Topics Discussed, Medications and their functions  Safety Interventions   Safety Discussed/Reviewed Safety Discussed        Alessandra Grout [facility] Care Management Care Management Community Coordinator Direct Phone: [phone] Toll Free: [phone] Fax: [phone]

## 2022-11-22 ENCOUNTER — Ambulatory Visit: Payer: Medicare Other

## 2022-11-24 ENCOUNTER — Telehealth: Payer: Self-pay

## 2022-11-24 NOTE — Patient Outreach (Signed)
  Care Coordination   Follow Up Visit Note   11/24/2022 Name: Johnny [name] MRN: [id number] DOB: 17-Feb-1946  Transition of Care-Follow Up Call   Incoming call from patient returning RN CM. He states he is feeling a little better today. He reports he has not had to take any Nitro today. He is wearing oxygen 24/7 at 2L/min. Patient states he was having some constipation yesterday so he took some Milk of Magnesium along with a few cups of prune juice. He has been having some loose stools today as a result. He is drinking water and hot tea to stay hydrated.   Patient sates he has felt up to working on getting his affairs together to move to either [place] or [place]. Discussed with patient the importance of PCP follow up post discharge. Patient was originally against making an appt as he was adamant that he was moving away ASAP. Today, he was agreeable to PCP follow up appt locally. Care guide assisted with scheduling appt with PCP for tomorrow-11/25/22. Patient confirms he is able to drive himself to appt. Denies any other RN CM needs or concerns at this time.     Care Coordination Interventions:  Yes, provided   TOC Interventions Today    Flowsheet Row Most Recent Value  TOC Interventions   TOC Interventions Discussed/Reviewed TOC Interventions Reviewed, Arranged PCP follow up within 7 days/Care Guide scheduled      Interventions Today    Flowsheet Row Most Recent Value  General Interventions   General Interventions Discussed/Reviewed General Interventions Reviewed, Doctor Visits  Doctor Visits Discussed/Reviewed Doctor Visits Reviewed, PCP  PCP/Specialist Visits Compliance with follow-up visit  Education Interventions   Education Provided Provided Education  Provided Verbal Education On Medication, When to see the doctor  Pharmacy Interventions   Pharmacy Dicussed/Reviewed Pharmacy Topics Reviewed, Medications and their functions  Safety Interventions   Safety  Discussed/Reviewed Safety Reviewed        Follow up plan:  Patient advised to call for any further needs or concerns.    Encounter Outcome:  Pt. Visit Completed   Alessandra Grout [facility] Care Management Care Management Community Coordinator Direct Phone: [phone] Toll Free: [phone] Fax: [phone]

## 2022-11-24 NOTE — Patient Outreach (Signed)
  Care Coordination   11/24/2022 Name: Johnny [name] MRN: [id number] DOB: 06/07/1946   Transition of Care-Follow Up Call  Outreach call to patient to follow up on previous TOC call.    Encounter Outcome:  No Answer   Care Coordination Interventions:  No, not indicated    Antionette Fairy, RN,BSN,CCM [facility] Care Management Care Management Community Coordinator Direct Phone: [phone] Toll Free: [phone] Fax: [phone]

## 2022-11-25 ENCOUNTER — Telehealth: Payer: Self-pay

## 2022-11-25 ENCOUNTER — Inpatient Hospital Stay: Payer: Medicare Other | Admitting: Family

## 2022-11-25 NOTE — Telephone Encounter (Signed)
I spoke with the patient and let him know that he was put on a remote schedule by accident. I told him to ignore the mychart message and phone call. I let him know that I was going to let the person know that they document incorrectly and to be more careful. The patient appreciated my help and thanked me.

## 2022-11-25 NOTE — Addendum Note (Signed)
Addended by: Reesa Chew on: 11/25/2022 03:13 PM   Modules accepted: Orders

## 2022-11-25 NOTE — Telephone Encounter (Signed)
Spoke with a very upset patient and tried to explain to him that he has to have the bipap titration to get the setting for a bipap. He has severe OSA and this is what he needs to treat it. Patient shouted he was going to call his lawyer and come after our practice but his wife convinced him to just go have the test so he can get the Bipap machine. He finally agreed to go have the bipap titration. I will precert it for it today.

## 2022-11-29 ENCOUNTER — Ambulatory Visit (HOSPITAL_BASED_OUTPATIENT_CLINIC_OR_DEPARTMENT_OTHER): Payer: Medicare Other | Admitting: Cardiology

## 2022-12-01 ENCOUNTER — Ambulatory Visit (INDEPENDENT_AMBULATORY_CARE_PROVIDER_SITE_OTHER): Payer: Medicare Other | Admitting: Adult Health

## 2022-12-01 ENCOUNTER — Encounter: Payer: Self-pay | Admitting: Adult Health

## 2022-12-01 DIAGNOSIS — Z0000 Encounter for general adult medical examination without abnormal findings: Secondary | ICD-10-CM

## 2022-12-01 NOTE — Progress Notes (Addendum)
This is a video visit.  Patient location:  Home  Provider location:  [facility]      Subjective:   Johnny [name] is a 77 y.o. male who presents for Medicare Annual/Subsequent preventive examination.  Review of Systems     Cardiac Risk Factors include: advanced age (>30men, >73 women);dyslipidemia;male gender     Objective:    There were no vitals filed for this visit. There is no height or weight on file to calculate BMI.     12/01/2022    1:54 PM 11/16/2022    5:11 PM 11/15/2022    5:48 PM 09/15/2022    3:09 PM 08/22/2022    1:42 PM 06/19/2022   10:21 PM 06/19/2022    4:51 AM  Advanced Directives  Does Patient Have a Medical Advance Directive? Yes  No No No  No  Type of Estate agent of [place];Living will        Does patient want to make changes to medical advance directive? No - Patient declined        Copy of Healthcare Power of Attorney in Chart? No - copy requested        Would patient like information on creating a medical advance directive? No - Patient declined No - Patient declined  No - Patient declined No - Patient declined No - Patient declined     Current Medications (verified) Outpatient Encounter Medications as of 12/01/2022  Medication Sig   albuterol (VENTOLIN HFA) 108 (90 Base) MCG/ACT inhaler Inhale 1 puff into the lungs every 6 (six) hours as needed for wheezing or shortness of breath.   carvedilol (COREG) 6.25 MG tablet TAKE 1 TABLET BY MOUTH TWICE  DAILY   cyanocobalamin (VITAMIN B12) 1000 MCG tablet Take 1 tablet (1,000 mcg total) by mouth daily.   ELIQUIS 5 MG TABS tablet TAKE 1 TABLET BY MOUTH TWICE  DAILY   famotidine (PEPCID) 40 MG tablet Take 40 mg by mouth daily.   fluticasone (FLONASE) 50 MCG/ACT nasal spray Place 2 sprays into both nostrils 3 (three) times daily as needed for allergies or rhinitis.   furosemide (LASIX) 40 MG tablet Take 1 tablet (40 mg total) by mouth as needed for edema.   loratadine (CLARITIN) 10 MG  tablet Take 1 tablet (10 mg total) by mouth daily.   montelukast (SINGULAIR) 10 MG tablet Take 10 mg by mouth daily as needed (for allergies).   nitroGLYCERIN (NITROSTAT) 0.4 MG SL tablet Place 1 tablet (0.4 mg total) under the tongue every 5 (five) minutes as needed for chest pain.   pantoprazole (PROTONIX) 40 MG tablet Take 1 tablet (40 mg total) by mouth daily before breakfast.   rosuvastatin (CRESTOR) 40 MG tablet TAKE 1 TABLET BY MOUTH DAILY   sodium chloride (OCEAN) 0.65 % SOLN nasal spray Place 1 spray into both nostrils as needed for congestion.   TYLENOL 8 HOUR ARTHRITIS PAIN 650 MG CR tablet Take 650-1,300 mg by mouth every 8 (eight) hours as needed for pain.   venlafaxine XR (EFFEXOR-XR) 150 MG 24 hr capsule Take 1 capsule (150 mg total) by mouth daily with breakfast.   No facility-administered encounter medications on file as of 12/01/2022.    Allergies (verified) Acacia and Metformin and related   History: 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[place], [place]   Past  Surgical History:  Procedure Laterality Date   BACK SURGERY  1975   Lower Back Surgery, Dr.Seltzer   BACK SURGERY  1985   Dr.Seltzer   LEFT HEART CATH AND CORS/GRAFTS ANGIOGRAPHY N/A 11/17/2022   Procedure: LEFT HEART CATH AND CORS/GRAFTS ANGIOGRAPHY;  Surgeon: Corky Crafts, MD;  Location: [facility] INVASIVE CV LAB;  Service: Cardiovascular;  Laterality: N/A;   SHOULDER SURGERY Left 1965   Dr.Seltzer   TONSILLECTOMY  1950   Dr.Perellie   Family History  Problem Relation Age of Onset   Cancer Father    Stroke Sister    Stroke Brother        x 2   Social History   Socioeconomic History   Marital status: Widowed    Spouse name: Not on file   Number of children: Not on file   Years of education: Not on file   Highest education level: Not on file  Occupational History   Not on file   Tobacco Use   Smoking status: Former    Packs/day: 1.00    Years: 10.00    Additional pack years: 0.00    Total pack years: 10.00    Types: Cigarettes   Smokeless tobacco: Never  Substance and Sexual Activity   Alcohol use: Never   Drug use: Never   Sexual activity: Not on file  Other Topics Concern   Not on file  Social History Narrative   Tobacco use, amount per day now: None   Past tobacco use, amount per day: 1 pack   How many years did you use tobacco: 10 years.   Alcohol use (drinks per week): None   Diet:   Do you drink/eat things with caffeine: Yes   Marital status:  Widow                                What year were you married? 1972   Do you live in a house, apartment, assisted living, condo, trailer, etc.? House   Is it one or more stories? One   How many persons live in your home? One   Do you have pets in your home?( please list) No   Highest Level of education completed? 12th grade.   Current or past profession: Psychologist, occupational, Investment banker, operational, Clinical biochemist.    Do you exercise?   No                               Type and how often?   Do you have a living will? Yes   Do you have a DNR form?     No                              If not, do you want to discuss one?   Do you have signed POA/HPOA forms?  Yes                      If so, please bring to you appointment      Do you have any difficulty bathing or dressing yourself? No   Do you have any difficulty preparing food or eating? No   Do you have any difficulty managing your medications? No   Do you have any difficulty managing your finances? Yes   Do you have any difficulty affording  your medications? No   Social Determinants of Health   Financial Resource Strain: Not on file  Food Insecurity: No Food Insecurity (11/17/2022)   Hunger Vital Sign    Worried About Running Out of Food in the Last Year: Never true    Ran Out of Food in the Last Year: Never true  Transportation Needs: No Transportation Needs (11/17/2022)    PRAPARE - Administrator, Civil Service (Medical): No    Lack of Transportation (Non-Medical): No  Physical Activity: Not on file  Stress: Not on file  Social Connections: Not on file    Tobacco Counseling Counseling given: Not Answered   Clinical Intake:                 Diabetic? Prediabetic         Activities of Daily Living    12/01/2022    1:57 PM 11/16/2022    5:14 PM  In your present state of health, do you have any difficulty performing the following activities:  Hearing? 0 0  Vision? 0 0  Difficulty concentrating or making decisions? 0 0  Walking or climbing stairs? 0 0  Dressing or bathing? 0 0  Doing errands, shopping? 0   Preparing Food and eating ? N   Using the Toilet? N   In the past six months, have you accidently leaked urine? N   Do you have problems with loss of bowel control? N   Managing your Medications? N   Managing your Finances? N   Housekeeping or managing your Housekeeping? N     Patient Care Team: Ngetich, Donalee Citrin, NP as PCP - General (Family Medicine) Little Ishikawa, MD as PCP - Cardiology (Cardiology)  Indicate any recent Medical Services you may have received from other than [facility] providers in the past year (date may be approximate).     Assessment:   This is a routine wellness examination for Floy.  Hearing/Vision screen No results found.  Dietary issues and exercise activities discussed: Current Exercise Habits: Home exercise routine, Type of exercise: walking, Time (Minutes): 30, Frequency (Times/Week): 3, Weekly Exercise (Minutes/Week): 90, Intensity: Mild, Exercise limited by: orthopedic condition(s)   Goals Addressed   None    Depression Screen    12/01/2022    1:55 PM 10/20/2021    9:29 PM  PHQ 2/9 Scores  PHQ - 2 Score 6 0  PHQ- 9 Score 17     Fall Risk    12/01/2022    1:54 PM 09/15/2022    3:09 PM 08/22/2022    1:42 PM 08/03/2022    1:03 PM 05/02/2022    9:28 AM  Fall Risk   Falls  in the past year? 1 1 0 0 1  Number falls in past yr: 1 0 0 0 1  Injury with Fall? 1 1 0 0 1  Risk for fall due to : History of fall(s);Impaired balance/gait History of fall(s);Impaired balance/gait No Fall Risks No Fall Risks History of fall(s)  Follow up Falls evaluation completed;Education provided;Falls prevention discussed Falls evaluation completed;Education provided;Falls prevention discussed Falls evaluation completed Falls evaluation completed Falls evaluation completed;Education provided;Falls prevention discussed    FALL RISK PREVENTION PERTAINING TO THE HOME:  Any stairs in or around the home? Yes  If so, are there any without handrails? No  Home free of loose throw rugs in walkways, pet beds, electrical cords, etc? Yes  Adequate lighting in your home to reduce risk of falls? Yes  ASSISTIVE DEVICES UTILIZED TO PREVENT FALLS:  Life alert? No  Use of a cane, walker or w/c? Yes  Grab bars in the bathroom? No  Shower chair or bench in shower? No  Elevated toilet seat or a handicapped toilet? No   TIMED UP AND GO:  Was the test performed? No .  Length of time to ambulate 10 feet: N/A sec.   Gait steady and fast with assistive device  Cognitive Function:      11/02/2022   10:02 AM  Montreal Cognitive Assessment   Visuospatial/ Executive (0/5) 3  Naming (0/3) 3  Attention: Read list of digits (0/2) 2  Attention: Read list of letters (0/1) 1  Attention: Serial 7 subtraction starting at 100 (0/3) 3  Language: Repeat phrase (0/2) 0  Language : Fluency (0/1) 1  Abstraction (0/2) 2  Delayed Recall (0/5) 0  Orientation (0/6) 6  Total 21      12/01/2022    1:58 PM  6CIT Screen  What Year? 0 points  What month? 3 points  What time? 0 points  Count back from 20 0 points  Months in reverse 0 points  Repeat phrase 6 points  Total Score 9 points    Immunizations Immunization History  Administered Date(s) Administered   Fluad Quad(high Dose 65+) 05/06/2020,  06/22/2022   Influenza Split 09/15/2009, 05/03/2013   Influenza, High Dose Seasonal PF 06/05/2014, 04/20/2015, 06/08/2016, 04/22/2017, 04/15/2021   Influenza-Unspecified 07/14/2004, 05/07/2007, 05/02/2008, 05/01/2018, 04/29/2019   PPD Test 06/08/2021   Pneumococcal Conjugate-13 06/05/2014   Pneumococcal Polysaccharide-23 08/13/2012    TDAP status: Up to date  Flu Vaccine status: Up to date  Pneumococcal vaccine status: Up to date  Covid-19 vaccine status: Information provided on how to obtain vaccines.   Qualifies for Shingles Vaccine? No   Zostavax completed No   Shingrix Completed?: No.    Education has been provided regarding the importance of this vaccine. Patient has been advised to call insurance company to determine out of pocket expense if they have not yet received this vaccine. Advised may also receive vaccine at local pharmacy or Health Dept. Verbalized acceptance and understanding.  Screening Tests Health Maintenance  Topic Date Due   Medicare Annual Wellness (AWV)  Never done   FOOT EXAM  Never done   OPHTHALMOLOGY EXAM  Never done   Diabetic kidney evaluation - Urine ACR  Never done   Hepatitis C Screening  Never done   DTaP/Tdap/Td (1 - Tdap) Never done   COVID-19 Vaccine (1) 12/17/2022 (Originally 05/17/1951)   Zoster Vaccines- Shingrix (1 of 2) 11/07/2023 (Originally 05/16/1965)   INFLUENZA VACCINE  03/02/2023   HEMOGLOBIN A1C  04/12/2023   Diabetic kidney evaluation - eGFR measurement  11/18/2023   Pneumonia Vaccine 21+ Years old  Completed   HPV VACCINES  Aged Out    Health Maintenance  Health Maintenance Due  Topic Date Due   Medicare Annual Wellness (AWV)  Never done   FOOT EXAM  Never done   OPHTHALMOLOGY EXAM  Never done   Diabetic kidney evaluation - Urine ACR  Never done   Hepatitis C Screening  Never done   DTaP/Tdap/Td (1 - Tdap) Never done    Colorectal cancer screening: Type of screening: Colonoscopy. Completed 2023. Repeat every No  result years  Lung Cancer Screening: (Low Dose CT Chest recommended if Age 52-80 years, 30 pack-year currently smoking OR have quit w/in 15years.) does not qualify.   Lung Cancer Screening Referral: No  Additional Screening:  Hepatitis C Screening: does not qualify; Completed Declined  Vision Screening: Recommended annual ophthalmology exams for early detection of glaucoma and other disorders of the eye. Is the patient up to date with their annual eye exam?  Yes  Who is the provider or what is the name of the office in which the patient attends annual eye exams? Eye doctor in [place], forgot the name If pt is not established with a provider, would they like to be referred to a provider to establish care? No .   Dental Screening: Recommended annual dental exams for proper oral hygiene  Community Resource Referral / Chronic Care Management: CRR required this visit?  No   CCM required this visit?  No      Plan:     I have personally reviewed and noted the following in the patient's chart:   Medical and social history Use of alcohol, tobacco or illicit drugs  Current medications and supplements including opioid prescriptions. Patient is not currently taking opioid prescriptions. Functional ability and status Nutritional status Physical activity Advanced directives List of other physicians Hospitalizations, surgeries, and ER visits in previous 12 months Vitals Screenings to include cognitive, depression, and falls Referrals and appointments  In addition, I have reviewed and discussed with patient certain preventive protocols, quality metrics, and best practice recommendations. A written personalized care plan for preventive services as well as general preventive health recommendations were provided to patient.     Hanny Elsberry Medina-Vargas, NP   12/01/2022   Nurse Notes:  Needs to repeat Cologuard and needs Hep C test. Follow up in office to address depression to which he  verbally agreed.

## 2022-12-04 ENCOUNTER — Ambulatory Visit (HOSPITAL_BASED_OUTPATIENT_CLINIC_OR_DEPARTMENT_OTHER): Payer: Medicare Other | Attending: Cardiology | Admitting: Cardiology

## 2022-12-06 DIAGNOSIS — R053 Chronic cough: Secondary | ICD-10-CM | POA: Diagnosis not present

## 2022-12-14 ENCOUNTER — Ambulatory Visit (HOSPITAL_COMMUNITY): Payer: Medicare Other

## 2022-12-16 ENCOUNTER — Telehealth: Payer: Self-pay

## 2022-12-16 DIAGNOSIS — R2681 Unsteadiness on feet: Secondary | ICD-10-CM

## 2022-12-16 DIAGNOSIS — I5032 Chronic diastolic (congestive) heart failure: Secondary | ICD-10-CM

## 2022-12-16 DIAGNOSIS — I480 Paroxysmal atrial fibrillation: Secondary | ICD-10-CM

## 2022-12-16 NOTE — Telephone Encounter (Signed)
Rolator walker ordered given to [facility] CMA to fax to [facility] as requested.

## 2022-12-16 NOTE — Telephone Encounter (Signed)
Noted  

## 2022-12-16 NOTE — Telephone Encounter (Signed)
Patient called stating he need a rolling walker with a seat sent to [facility], order is pending for provide to review and advise

## 2022-12-18 DIAGNOSIS — U071 COVID-19: Secondary | ICD-10-CM | POA: Diagnosis not present

## 2022-12-19 ENCOUNTER — Telehealth: Payer: Self-pay | Admitting: Cardiology

## 2022-12-19 NOTE — Telephone Encounter (Signed)
Attempted to call patient, left message for patient to call back to office.   

## 2022-12-19 NOTE — Telephone Encounter (Signed)
Pt states "there are three doctors in [place] that are doing a balloon procedure. Why can't the doctors here do this for me? If I dont have it I wont make it another year." Please advise.

## 2022-12-20 NOTE — Telephone Encounter (Signed)
Left voicemail for patient to return call to office. 

## 2022-12-20 NOTE — Telephone Encounter (Signed)
Patient returned call

## 2022-12-21 DIAGNOSIS — U071 COVID-19: Secondary | ICD-10-CM | POA: Diagnosis not present

## 2022-12-21 NOTE — Telephone Encounter (Signed)
Patient states he wants the "balloon procedure".  He has not discussed with provider.  He states "I'm getting worse". He states breathing and chest pain down to left arm and "feels like I'm going to throw up".  He states this has been since April 19 when he was discharged from hospital. He states needs something done! Please advise

## 2022-12-21 NOTE — Telephone Encounter (Signed)
Called home and second number no answer.  LVM to call our office

## 2022-12-22 NOTE — Telephone Encounter (Signed)
As we have discussed with him, does not need a procedure at this time.  Had recent heart catheterization with no blockages that need stented.  His aortic stenosis is not at the point that needs to be treated yet but we will continue to monitor

## 2022-12-22 NOTE — Telephone Encounter (Signed)
Patient called to follow-up on his request for a "balloon procedure" and requested a call back to his cell# [phone].

## 2022-12-22 NOTE — Telephone Encounter (Signed)
Left message for pt to call back  °

## 2022-12-23 ENCOUNTER — Telehealth: Payer: Self-pay | Admitting: Cardiology

## 2022-12-23 NOTE — Telephone Encounter (Addendum)
Pt is calling back with his blood pressure currently 106/65, he took this prior to eating. Pt did eat an egg sandwich and drank water and some coffee. He states that he still feels fatigued and wants to sleep more. Pt states that he is having to use his oxygen more and more throughout the day, which is unusual for him, he typically only has to use his oxygen at night. Pt also complains about his the swelling that he gets in his legs from time to time. Pt also mentions that sometimes his legs can be discolored at times. Scheduled pt appointment with PA to re-access. Pt verbalizes understanding.

## 2022-12-23 NOTE — Telephone Encounter (Signed)
Follow Up:      Patient is returning a call from yesterday. 

## 2022-12-23 NOTE — Telephone Encounter (Signed)
Left message for pt to call back. I told him that I would call him back- while he eats and drinks something and checks BP

## 2022-12-23 NOTE — Telephone Encounter (Signed)
See additional encounter related to this message.

## 2022-12-23 NOTE — Telephone Encounter (Signed)
Pt c/o Shortness Of Breath: STAT if SOB developed within the last 24 hours or pt is noticeably SOB on the phone  1. Are you currently SOB (can you hear that pt is SOB on the phone)? Yes   2. How long have you been experiencing SOB? States since he started calling, could not give an exact time frame. Says that it is happening everyday.   3. Are you SOB when sitting or when up moving around? Both all of the time  4. Are you currently experiencing any other symptoms? Dizziness and nausea. Patient states that he feels like he is going to pass out and throw up.

## 2022-12-23 NOTE — Telephone Encounter (Signed)
  Johnny Ishikawa, MD  Physician Cardiology   Telephone Encounter Signed   Creation Time: 12/22/2022  5:35 AM   Signed     As we have discussed with him, does not need a procedure at this time.  Had recent heart catheterization with no blockages that need stented.  His aortic stenosis is not at the point that needs to be treated yet but we will continue to monitor          Pt called back and I gave him the information above.   Pt reports s/s- nausea and dizziness, says he is unsteady. He states he has not eaten today and has not checked his BP. Instructed pt to eat and drink something and check BP. I

## 2022-12-28 ENCOUNTER — Ambulatory Visit: Payer: Medicare Other | Admitting: Physician Assistant

## 2022-12-28 NOTE — Progress Notes (Signed)
Cardiology Office Note:    Date:  01/03/2023   ID:  Johnny [name], DOB 1945-12-23, MRN [id number]  PCP:  Caesar Bookman, NP  [facility] Providers Cardiologist:  Little Ishikawa, MD     Referring MD: Caesar Bookman, NP   Chief Complaint:  Hospitalization Follow-up, Chest Pain, and Shortness of Breath     History of Present Illness:   Johnny [name] is a 77 y.o. male with hx of CAD status post CABG in 2015, moderate aortic stenosis, diastolic heart failure, OSA, atrial fibrillation, symptomatic bradycardia status post PPM who is being seen 11/15/2022 for the evaluation of chest pain.  Lexiscan Myoview on 12/16/2021 showed no ischemia or infarction, EF 58%.  Echocardiogram 06/2022 showed EF 65 to 70%, normal RV function, moderate aortic stenosis (mean gradient 17 mmHg, V-max 2.9 m/s, VTI 1.2 cm, DI 0.37).   Creatinine was initially elevated at 2.02, previous baseline 1.7.  Home Lasix held.  Serial troponin mildly elevated but remained flat.  Chest pain improved with IV heparin and IV nitroglycerin however did not completely go away.  Echocardiogram obtained on 11/16/2022 demonstrated EF 65-70%, grade 2 DD, no RWMA, moderate LVH, RVSP 23.8 mmHg, moderate AS.  Subsequent cardiac catheterization performed on the following day showed patent stent in RCA, occluded SVG-OM, patent prox to mid LCx, 25% OM1, 100% distal LCx to R to L and L to L collaterals (not a candidate for CTO PCI), LIMA-LAD is small and slow filling but patent, patent mid to distal LCx stent.  There was no culprit lesion to explain his chest discomfort.  Eliquis was resumed on 11/18/2022.  Repeat basic metabolic panel shows creatinine stable at 1.6.  Patient does have severe obstructive sleep apnea noted on previous sleep study in June 2023, it was recommended he follow-up with the sleep specialist to help start BiPAP therapy as outpatient.  Social worker was consulted to help with outpatient living situation.  He in the  expressed wish to go to a facility in [place].  He was seen by PT who did not recommend skilled nursing facility.  He has family in [place], ultimately, he decided to go to [place] to be closer to his sister and apply for Medicaid there to get more assistance.    Patient called in 12/19/22 with increased dyspnea with more oxygen requirement, swelling in legs, dizziness. Never moved to [place]-couldn't afford a flight. Chronic DOE. Wants his valve fixed now or a balloon in his heart. Also wants abdominal hernia fixed. Has regular chest pressure with activity or rest. Goes away quickly. Uses NTG frequently. He takes lasix daily every other day or his feet swell. Watches salt closely. Doesn't eat out but also talking about salami and hot dogs.      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[place], [place]   Current Medications: Current Meds  Medication Sig   albuterol (VENTOLIN HFA) 108 (90 Base) MCG/ACT inhaler Inhale 1 puff into the lungs every 6 (six) hours as needed for wheezing or shortness of breath.   carvedilol (COREG) 6.25 MG tablet TAKE 1 TABLET BY MOUTH TWICE  DAILY   cyanocobalamin (VITAMIN B12) 1000 MCG tablet Take 1 tablet (1,000 mcg total) by mouth daily.   ELIQUIS 5 MG TABS tablet TAKE 1 TABLET BY MOUTH TWICE  DAILY  famotidine (PEPCID) 40 MG tablet Take 40 mg by mouth daily.   fluticasone (FLONASE) 50 MCG/ACT nasal spray Place 2 sprays into both nostrils 3 (three) times daily as needed for allergies or rhinitis.   loratadine (CLARITIN) 10 MG tablet Take 1 tablet (10 mg total) by mouth daily.   montelukast (SINGULAIR) 10 MG tablet Take 10 mg by mouth daily as needed (for allergies).   nitroGLYCERIN (NITROSTAT) 0.4 MG SL tablet Place 1 tablet (0.4 mg total) under the tongue every 5 (five) minutes as needed for chest pain.   pantoprazole (PROTONIX) 40 MG tablet  Take 1 tablet (40 mg total) by mouth daily before breakfast.   ranolazine (RANEXA) 500 MG 12 hr tablet Take 1 tablet (500 mg total) by mouth 2 (two) times daily.   rosuvastatin (CRESTOR) 40 MG tablet TAKE 1 TABLET BY MOUTH DAILY   sodium chloride (OCEAN) 0.65 % SOLN nasal spray Place 1 spray into both nostrils as needed for congestion.   TYLENOL 8 HOUR ARTHRITIS PAIN 650 MG CR tablet Take 650-1,300 mg by mouth every 8 (eight) hours as needed for pain.   venlafaxine XR (EFFEXOR-XR) 150 MG 24 hr capsule Take 1 capsule (150 mg total) by mouth daily with breakfast.   [DISCONTINUED] furosemide (LASIX) 40 MG tablet Take 1 tablet (40 mg total) by mouth as needed for edema.    Allergies:   Acacia and Metformin and related   Social History   Tobacco Use   Smoking status: Former    Packs/day: 1.00    Years: 10.00    Additional pack years: 0.00    Total pack years: 10.00    Types: Cigarettes   Smokeless tobacco: Never  Substance Use Topics   Alcohol use: Never   Drug use: Never    Family Hx: The patient's family history includes Cancer in his father; Stroke in his brother and sister.  ROS     Physical Exam:    VS:  BP 92/60   Pulse 68   Ht 5\' 7"  (1.702 m)   Wt 200 lb (90.7 kg)   SpO2 97%   BMI 31.32 kg/m     Wt Readings from Last 3 Encounters:  01/03/23 200 lb (90.7 kg)  11/16/22 204 lb 12.9 oz (92.9 kg)  11/10/22 217 lb 9.6 oz (98.7 kg)    Physical Exam  GEN: Obese, in no acute distress  Neck: no JVD, carotid bruits, or masses Cardiac:RRR; 3/6 systolic murmur LSB  Respiratory:  clear to auscultation bilaterally, normal work of breathing GI: soft, nontender, nondistended, + BS Ext: without cyanosis, clubbing, or edema, Good distal pulses bilaterally Neuro:  Alert and Oriented x 3,   Psych: euthymic mood, full affect        EKGs/Labs/Other Test Reviewed:    EKG:  EKG is not ordered today.     Recent Labs: 11/02/2022: TSH 1.900 11/15/2022: ALT 30; B Natriuretic  Peptide 75.9; Magnesium 1.9 11/18/2022: BUN 19; Creatinine, Ser 1.63; Hemoglobin 14.2; Platelets 175; Potassium 4.1; Sodium 137   Recent Lipid Panel Recent Labs    06/19/22 1519 10/10/22 1510  CHOL 148 158  TRIG 247* 154*  HDL 59 70  VLDL 49*  --   LDLCALC 40 64     Prior CV Studies:   Echo 11/16/2022  1. Left ventricular ejection fraction, by estimation, is 65 to 70%. The left ventricle has normal function. The left ventricle has no regional wall motion abnormalities. There is moderate concentric left ventricular hypertrophy. Left ventricular  diastolic parameters are consistent with Grade II diastolic dysfunction (pseudonormalization).   2. Right ventricular systolic function is mildly reduced. The right ventricular size is normal. There is normal pulmonary artery systolic pressure. The estimated right ventricular systolic pressure is 23.8 mmHg.   3. Left atrial size was mildly dilated.   4. The mitral valve is normal in structure. Trivial mitral valve regurgitation. No evidence of mitral stenosis.   5. The aortic valve is tricuspid. There is moderate calcification of the aortic valve. There is moderate thickening of the aortic valve. Aortic valve regurgitation is not visualized. Moderate aortic valve stenosis. Aortic valve Vmax measures 3.21 m/s.   6. There is borderline dilatation of the ascending aorta, measuring 39 mm.   7. The inferior vena cava is normal in size with greater than 50% respiratory variability, suggesting right atrial pressure of 3 mmHg.   Comparison(s): Prior images reviewed side by side. Aortic stenosis is  slightly worse, but remains moderate.      Cath 11/17/2022   Prox RCA to Mid RCA lesion is 10% stenosed. Patent stents.   SVG to OM graft is occluded.  Native proximal to mid circumflex is widely patent.   1st Mrg lesion is 25% stenosed.   Dist Cx lesion is 100% stenosed.  Right to left and left to left collaterals.   LIMA to LAD is small and fills slowly but  appears to be patent.  Distal LAD is very small.   Previously placed Mid Cx to Dist Cx stent of unknown type is  widely patent.   LV end diastolic pressure is mildly elevated.   There is mild aortic valve stenosis.   Appears to have adequate circulation to all three coronary distributions.   Chronically occluded distal circumflex is not a candidate for CTO PCI.  No target for PCI.  Continue aggressive medical therapy.   Results conveyed to the patient's daughter lives in [place].  She is most concerned about his social situation.  She notes that he lives with a roommate who is not a family member and is not a caretaker.  He needs more support and she thinks he needs to be placed in some type of facility.  Apparently, he does not have a good relationship with his family who is in [place].  She is hoping to speak to a Child psychotherapist to help with his social situation.   Diagnostic Dominance: Right    _____________   Risk Assessment/Calculations/Metrics:    CHA2DS2-VASc Score = 6   This indicates a 9.7% annual risk of stroke. The patient's score is based upon: CHF History: 1 HTN History: 1 Diabetes History: 1 Stroke History: 0 Vascular Disease History: 1 Age Score: 2 Gender Score: 0             ASSESSMENT & PLAN:   No problem-specific Assessment & Plan notes found for this encounter.   CAD NSTEMI trop elevated but flat cardiac catheterization 11/17/22  showed patent stent in RCA, occluded SVG-OM, patent prox to mid LCx, 25% OM1, 100% distal LCx to R to L and L to L collaterals (not a candidate for CTO PCI), LIMA-LAD is small and slow filling but patent, patent mid to distal LCx stent.  There was no culprit lesion to explain his chest discomfort.  Continues to have chest pain and DOE. Bp low. Will decrease lasix 20 mg daily, add ranexa 500 mg bid and keep f/u with Dr. Bjorn Pippin.  PAF on eliquis-no bleeding problems.  OSA on CPAP  Chronic diastolic CHF LVEF 65-70% grade 2DD  mod LVH   mod AS echo 11/16/22-no evidence of fluid overload on exam today.  HTN BP low-try to decrease lasix  CKD 3b  Crt 1.63 at discharge -recheck today.              Dispo:  No follow-ups on file.   Medication Adjustments/Labs and Tests Ordered: Current medicines are reviewed at length with the patient today.  Concerns regarding medicines are outlined above.  Tests Ordered: Orders Placed This Encounter  Procedures   Basic metabolic panel   Medication Changes: Meds ordered this encounter  Medications   ranolazine (RANEXA) 500 MG 12 hr tablet    Sig: Take 1 tablet (500 mg total) by mouth 2 (two) times daily.    Dispense:  180 tablet    Refill:  3   furosemide (LASIX) 40 MG tablet    Sig: Take 0.5 tablets (20 mg total) by mouth daily.    Dispense:  45 tablet    Refill:  3    Please send a replace/new response with 100-Day Supply if appropriate to maximize member benefit. Requesting 1 year supply.   Signed, Jacolyn Reedy, PA-C  01/03/2023 2:10 PM    [facility] [address] [place], [place], [place]  [address] Phone: [phone]; Fax: [phone]

## 2023-01-03 ENCOUNTER — Ambulatory Visit: Payer: Medicare Other | Attending: Physician Assistant | Admitting: Physician Assistant

## 2023-01-03 ENCOUNTER — Encounter: Payer: Self-pay | Admitting: Physician Assistant

## 2023-01-03 VITALS — BP 92/60 | HR 68 | Ht 67.0 in | Wt 200.0 lb

## 2023-01-03 DIAGNOSIS — I10 Essential (primary) hypertension: Secondary | ICD-10-CM

## 2023-01-03 DIAGNOSIS — N1832 Chronic kidney disease, stage 3b: Secondary | ICD-10-CM | POA: Diagnosis not present

## 2023-01-03 DIAGNOSIS — G4733 Obstructive sleep apnea (adult) (pediatric): Secondary | ICD-10-CM

## 2023-01-03 DIAGNOSIS — I5032 Chronic diastolic (congestive) heart failure: Secondary | ICD-10-CM | POA: Diagnosis not present

## 2023-01-03 DIAGNOSIS — I350 Nonrheumatic aortic (valve) stenosis: Secondary | ICD-10-CM

## 2023-01-03 DIAGNOSIS — I2510 Atherosclerotic heart disease of native coronary artery without angina pectoris: Secondary | ICD-10-CM | POA: Diagnosis not present

## 2023-01-03 DIAGNOSIS — Z79899 Other long term (current) drug therapy: Secondary | ICD-10-CM | POA: Diagnosis not present

## 2023-01-03 DIAGNOSIS — R079 Chest pain, unspecified: Secondary | ICD-10-CM | POA: Diagnosis not present

## 2023-01-03 MED ORDER — RANOLAZINE ER 500 MG PO TB12: 500.0000 mg | ORAL_TABLET | Freq: Two times a day (BID) | ORAL | 3 refills | Status: AC

## 2023-01-03 MED ORDER — FUROSEMIDE 40 MG PO TABS
20.0000 mg | ORAL_TABLET | Freq: Every day | ORAL | 3 refills | Status: DC
Start: 2023-01-03 — End: 2023-01-18

## 2023-01-03 NOTE — Patient Instructions (Addendum)
Medication Instructions:  Your physician has recommended you make the following change in your medication:   ** Decrease Furosemide 40mg  - 1/2 tablet by mouth daily.  ** Begin Ranexa (Ranolazine) 500mg  - 1 tablet by mouth twice daily  *If you need a refill on your cardiac medications before your next appointment, please call your pharmacy*   Lab Work:  BMET today  If you have labs (blood work) drawn today and your tests are completely normal, you will receive your results only by: MyChart Message (if you have MyChart) OR A paper copy in the mail If you have any lab test that is abnormal or we need to change your treatment, we will call you to review the results.   Testing/Procedures: None ordered.    Follow-Up: At [facility], you and your health needs are our priority.  As part of our continuing mission to provide you with exceptional heart care, we have created designated Provider Care Teams.  These Care Teams include your primary Cardiologist (physician) and Advanced Practice Providers (APPs -  Physician Assistants and Nurse Practitioners) who all work together to provide you with the care you need, when you need it.  We recommend signing up for the patient portal called "MyChart".  Sign up information is provided on this After Visit Summary.  MyChart is used to connect with patients for Virtual Visits (Telemedicine).  Patients are able to view lab/test results, encounter notes, upcoming appointments, etc.  Non-urgent messages can be sent to your provider as well.   To learn more about what you can do with MyChart, go to [email or web address].    Your next appointment:   As scheduled  Two Gram Sodium Diet 2000 mg  What is Sodium? Sodium is a mineral found naturally in many foods. The most significant source of sodium in the diet is table salt, which is about 40% sodium.  Processed, convenience, and preserved foods also contain a large amount of sodium.  The body  needs only 500 mg of sodium daily to function,  A normal diet provides more than enough sodium even if you do not use salt.  Why Limit Sodium? A build up of sodium in the body can cause thirst, increased blood pressure, shortness of breath, and water retention.  Decreasing sodium in the diet can reduce edema and risk of heart attack or stroke associated with high blood pressure.  Keep in mind that there are many other factors involved in these health problems.  Heredity, obesity, lack of exercise, cigarette smoking, stress and what you eat all play a role.  General Guidelines: Do not add salt at the table or in cooking.  One teaspoon of salt contains over 2 grams of sodium. Read food labels Avoid processed and convenience foods Ask your dietitian before eating any foods not dicussed in the menu planning guidelines Consult your physician if you wish to use a salt substitute or a sodium containing medication such as antacids.  Limit milk and milk products to 16 oz (2 cups) per day.  Shopping Hints: READ LABELS!! "Dietetic" does not necessarily mean low sodium. Salt and other sodium ingredients are often added to foods during processing.    Menu Planning Guidelines Food Group Choose More Often Avoid  Beverages (see also the milk group All fruit juices, low-sodium, salt-free vegetables juices, low-sodium carbonated beverages Regular vegetable or tomato juices, commercially softened water used for drinking or cooking  Breads and Cereals Enriched white, wheat, rye and pumpernickel bread, hard rolls and  dinner rolls; muffins, cornbread and waffles; most dry cereals, cooked cereal without added salt; unsalted crackers and breadsticks; low sodium or homemade bread crumbs Bread, rolls and crackers with salted tops; quick breads; instant hot cereals; pancakes; commercial bread stuffing; self-rising flower and biscuit mixes; regular bread crumbs or cracker crumbs  Desserts and Sweets Desserts and sweets mad  with mild should be within allowance Instant pudding mixes and cake mixes  Fats Butter or margarine; vegetable oils; unsalted salad dressings, regular salad dressings limited to 1 Tbs; light, sour and heavy cream Regular salad dressings containing bacon fat, bacon bits, and salt pork; snack dips made with instant soup mixes or processed cheese; salted nuts  Fruits Most fresh, frozen and canned fruits Fruits processed with salt or sodium-containing ingredient (some dried fruits are processed with sodium sulfites        Vegetables Fresh, frozen vegetables and low- sodium canned vegetables Regular canned vegetables, sauerkraut, pickled vegetables, and others prepared in brine; frozen vegetables in sauces; vegetables seasoned with ham, bacon or salt pork  Condiments, Sauces, Miscellaneous  Salt substitute with physician's approval; pepper, herbs, spices; vinegar, lemon or lime juice; hot pepper sauce; garlic powder, onion powder, low sodium soy sauce (1 Tbs.); low sodium condiments (ketchup, chili sauce, mustard) in limited amounts (1 tsp.) fresh ground horseradish; unsalted tortilla chips, pretzels, potato chips, popcorn, salsa (1/4 cup) Any seasoning made with salt including garlic salt, celery salt, onion salt, and seasoned salt; sea salt, rock salt, kosher salt; meat tenderizers; monosodium glutamate; mustard, regular soy sauce, barbecue, sauce, chili sauce, teriyaki sauce, steak sauce, Worcestershire sauce, and most flavored vinegars; canned gravy and mixes; regular condiments; salted snack foods, olives, picles, relish, horseradish sauce, catsup   Food preparation: Try these seasonings Meats:    Pork Sage, onion Serve with applesauce  Chicken Poultry seasoning, thyme, parsley Serve with cranberry sauce  Lamb Curry powder, rosemary, garlic, thyme Serve with mint sauce or jelly  Veal Marjoram, basil Serve with current jelly, cranberry sauce  Beef Pepper, bay leaf Serve with dry mustard, unsalted  chive butter  [place] leaf, dill Serve with unsalted lemon butter, unsalted parsley butter  Vegetables:    Asparagus Lemon juice   Broccoli Lemon juice   Carrots Mustard dressing parsley, mint, nutmeg, glazed with unsalted butter and sugar   Green beans Marjoram, lemon juice, nutmeg,dill seed   Tomatoes Basil, marjoram, onion   Spice /blend for [employer]" 4 tsp ground thyme 1 tsp ground sage 3 tsp ground rosemary 4 tsp ground marjoram   Test your knowledge A product that says "Salt Free" may still contain sodium. True or False Garlic Powder and Hot Pepper Sauce an be used as alternative seasonings.True or False Processed foods have more sodium than fresh foods.  True or False Canned Vegetables have less sodium than froze True or False   WAYS TO DECREASE YOUR SODIUM INTAKE Avoid the use of added salt in cooking and at the table.  Table salt (and other prepared seasonings which contain salt) is probably one of the greatest sources of sodium in the diet.  Unsalted foods can gain flavor from the sweet, sour, and butter taste sensations of herbs and spices.  Instead of using salt for seasoning, try the following seasonings with the foods listed.  Remember: how you use them to enhance natural food flavors is limited only by your creativity... Allspice-Meat, fish, eggs, fruit, peas, red and yellow vegetables Almond Extract-Fruit baked goods Anise Seed-Sweet breads, fruit, carrots, beets, cottage  cheese, cookies (tastes like licorice) Basil-Meat, fish, eggs, vegetables, rice, vegetables salads, soups, sauces Bay Leaf-Meat, fish, stews, poultry Burnet-Salad, vegetables (cucumber-like flavor) Caraway Seed-Bread, cookies, cottage cheese, meat, vegetables, cheese, rice Cardamon-Baked goods, fruit, soups Celery Powder or seed-Salads, salad dressings, sauces, meatloaf, soup, bread.Do not use  celery salt Chervil-Meats, salads, fish, eggs, vegetables, cottage cheese (parsley-like flavor) Chili  Power-Meatloaf, chicken cheese, corn, eggplant, egg dishes Chives-Salads cottage cheese, egg dishes, soups, vegetables, sauces Cilantro-Salsa, casseroles Cinnamon-Baked goods, fruit, pork, lamb, chicken, carrots Cloves-Fruit, baked goods, fish, pot roast, green beans, beets, carrots Coriander-Pastry, cookies, meat, salads, cheese (lemon-orange flavor) Cumin-Meatloaf, fish,cheese, eggs, cabbage,fruit pie (caraway flavor) [employer], fruit, eggs, fish, poultry, cottage cheese, vegetables Dill Seed-Meat, cottage cheese, poultry, vegetables, fish, salads, bread Fennel Seed-Bread, cookies, apples, pork, eggs, fish, beets, cabbage, cheese, Licorice-like flavor Garlic-(buds or powder) Salads, meat, poultry, fish, bread, butter, vegetables, potatoes.Do not  use garlic salt Ginger-Fruit, vegetables, baked goods, meat, fish, poultry Horseradish Root-Meet, vegetables, butter Lemon Juice or Extract-Vegetables, fruit, tea, baked goods, fish salads Mace-Baked goods fruit, vegetables, fish, poultry (taste like nutmeg) Maple Extract-Syrups Marjoram-Meat, chicken, fish, vegetables, breads, green salads (taste like Sage) Mint-Tea, lamb, sherbet, vegetables, desserts, carrots, cabbage Mustard, Dry or Seed-Cheese, eggs, meats, vegetables, poultry Nutmeg-Baked goods, fruit, chicken, eggs, vegetables, desserts Onion Powder-Meat, fish, poultry, vegetables, cheese, eggs, bread, rice salads (Do not use   Onion salt) Orange Extract-Desserts, baked goods Oregano-Pasta, eggs, cheese, onions, pork, lamb, fish, chicken, vegetables, green salads Paprika-Meat, fish, poultry, eggs, cheese, vegetables Parsley Flakes-Butter, vegetables, meat fish, poultry, eggs, bread, salads (certain forms may   Contain sodium Pepper-Meat fish, poultry, vegetables, eggs Peppermint Extract-Desserts, baked goods Poppy Seed-Eggs, bread, cheese, fruit dressings, baked goods, noodles, vegetables, cottage  [place], poultry, meat, fish, cauliflower, turnips,eggs bread Saffron-Rice, bread, veal, chicken, fish, eggs Sage-Meat, fish, poultry, onions, eggplant, tomateos, pork, stews Savory-Eggs, salads, poultry, meat, rice, vegetables, soups, pork Tarragon-Meat, poultry, fish, eggs, butter, vegetables (licorice-like flavor)  Thyme-Meat, poultry, fish, eggs, vegetables, (clover-like flavor), sauces, soups Tumeric-Salads, butter, eggs, fish, rice, vegetables (saffron-like flavor) Vanilla Extract-Baked goods, candy Vinegar-Salads, vegetables, meat marinades Walnut Extract-baked goods, candy   2. Choose your Foods Wisely   The following is a list of foods to avoid which are high in sodium:  Meats-Avoid all smoked, canned, salt cured, dried and kosher meat and fish as well as Anchovies   [employer] meats:Bologna, Liverwurst, Pastrami Canned meat or fish  Marinated herring Caviar    Pepperoni Corned Beef   Pizza Dried chipped beef  Salami Frozen breaded fish or meat Salt pork Frankfurters or hot dogs  Sardines Gefilte fish   Sausage Ham (boiled ham, Proscuitto Smoked butt    spiced ham)   Spam      TV Dinners Vegetables Canned vegetables (Regular) Relish Canned mushrooms  Sauerkraut Olives    Tomato juice Pickles  Bakery and Dessert Products Canned puddings  Cream pies Cheesecake   Decorated cakes Cookies  Beverages/Juices Tomato juice, regular  Gatorade   V-8 vegetable juice, regular  Breads and Cereals Biscuit mixes   Salted potato chips, corn chips, pretzels Bread stuffing mixes  Salted crackers and rolls Pancake and waffle mixes Self-rising flour  Seasonings Accent    Meat sauces Barbecue sauce  Meat tenderizer Catsup    Monosodium glutamate (MSG) Celery salt   Onion salt Chili sauce   Prepared mustard Garlic salt   Salt, seasoned salt, sea salt Gravy mixes   Soy sauce Horseradish  Steak sauce Ketchup   Tartar sauce Lite  salt    Teriyaki sauce Marinade mixes   Worcestershire sauce  Others Baking powder   Cocoa and cocoa mixes Baking soda   Commercial casserole mixes Candy-caramels, chocolate  Dehydrated soups    Bars, fudge,nougats  Instant rice and pasta mixes Canned broth or soup  Maraschino cherries Cheese, aged and processed cheese and cheese spreads  Learning Assessment Quiz  Indicated T (for True) or F (for False) for each of the following statements:  _____ Fresh fruits and vegetables and unprocessed grains are generally low in sodium _____ Water may contain a considerable amount of sodium, depending on the source _____ You can always tell if a food is high in sodium by tasting it _____ Certain laxatives my be high in sodium and should be avoided unless prescribed   by a physician or pharmacist _____ Salt substitutes may be used freely by anyone on a sodium restricted diet _____ Sodium is present in table salt, food additives and as a natural component of   most foods _____ Table salt is approximately 90% sodium _____ Limiting sodium intake may help prevent excess fluid accumulation in the body _____ On a sodium-restricted diet, seasonings such as bouillon soy sauce, and    cooking wine should be used in place of table salt _____ On an ingredient list, a product which lists monosodium glutamate as the first   ingredient is an appropriate food to include on a low sodium diet  Circle the best answer(s) to the following statements (Hint: there may be more than one correct answer)  11. On a low-sodium diet, some acceptable snack items are:    A. Olives  F. Bean dip   K. Grapefruit juice    B. Salted Pretzels G. Commercial Popcorn   L. Canned peaches    C. Carrot Sticks  H. Bouillon   M. Unsalted nuts   D. [place] fries  I. Peanut butter crackers N. Salami   E. Sweet pickles J. Tomato Juice   O. Pizza  12.  Seasonings that may be used freely on a reduced - sodium diet include   A. Lemon  wedges F.Monosodium glutamate K. Celery seed    B.Soysauce   G. Pepper   L. Mustard powder   C. Sea salt  H. Cooking wine  M. Onion flakes   D. Vinegar  E. Prepared horseradish N. Salsa   E. Sage   J. Worcestershire sauce  O. Chutney

## 2023-01-04 LAB — BASIC METABOLIC PANEL
BUN/Creatinine Ratio: 12 (ref 10–24)
BUN: 25 mg/dL (ref 8–27)
CO2: 27 mmol/L (ref 20–29)
Calcium: 10.6 mg/dL — ABNORMAL HIGH (ref 8.6–10.2)
Chloride: 101 mmol/L (ref 96–106)
Creatinine, Ser: 2.02 mg/dL — ABNORMAL HIGH (ref 0.76–1.27)
Glucose: 87 mg/dL (ref 70–99)
Potassium: 4.3 mmol/L (ref 3.5–5.2)
Sodium: 142 mmol/L (ref 134–144)
eGFR: 34 mL/min/{1.73_m2} — ABNORMAL LOW (ref >59)

## 2023-01-06 DIAGNOSIS — R053 Chronic cough: Secondary | ICD-10-CM | POA: Diagnosis not present

## 2023-01-11 ENCOUNTER — Telehealth: Payer: Self-pay | Admitting: *Deleted

## 2023-01-11 DIAGNOSIS — I5032 Chronic diastolic (congestive) heart failure: Secondary | ICD-10-CM

## 2023-01-11 NOTE — Telephone Encounter (Signed)
-----   Message from Dyann Kief, PA-C sent at 01/04/2023 10:45 AM EDT ----- Kidney function up higher than discharge. I decreased lasix yest to 20 mg daily. Lets repeat bmet in 2 weeks to see if it's improved on lower dose lasix, thanks

## 2023-01-11 NOTE — Telephone Encounter (Signed)
Patient returned call.  Reviewed lab results and confirmed he has decreased dose of lasix to 20 mg daily.  He will stay hydrated and return on 01/17/23 for blood work.

## 2023-01-16 DIAGNOSIS — H2512 Age-related nuclear cataract, left eye: Secondary | ICD-10-CM | POA: Diagnosis not present

## 2023-01-16 DIAGNOSIS — H25043 Posterior subcapsular polar age-related cataract, bilateral: Secondary | ICD-10-CM | POA: Diagnosis not present

## 2023-01-16 DIAGNOSIS — H25013 Cortical age-related cataract, bilateral: Secondary | ICD-10-CM | POA: Diagnosis not present

## 2023-01-16 DIAGNOSIS — H2513 Age-related nuclear cataract, bilateral: Secondary | ICD-10-CM | POA: Diagnosis not present

## 2023-01-16 DIAGNOSIS — H40013 Open angle with borderline findings, low risk, bilateral: Secondary | ICD-10-CM | POA: Diagnosis not present

## 2023-01-17 ENCOUNTER — Ambulatory Visit: Payer: Medicare Other | Attending: Cardiology

## 2023-01-17 DIAGNOSIS — I5032 Chronic diastolic (congestive) heart failure: Secondary | ICD-10-CM

## 2023-01-18 ENCOUNTER — Telehealth: Payer: Self-pay | Admitting: *Deleted

## 2023-01-18 ENCOUNTER — Other Ambulatory Visit: Payer: Medicare Other

## 2023-01-18 DIAGNOSIS — Z79899 Other long term (current) drug therapy: Secondary | ICD-10-CM

## 2023-01-18 DIAGNOSIS — U071 COVID-19: Secondary | ICD-10-CM | POA: Diagnosis not present

## 2023-01-18 DIAGNOSIS — N1832 Chronic kidney disease, stage 3b: Secondary | ICD-10-CM

## 2023-01-18 DIAGNOSIS — R7989 Other specified abnormal findings of blood chemistry: Secondary | ICD-10-CM

## 2023-01-18 LAB — BASIC METABOLIC PANEL
BUN/Creatinine Ratio: 13 (ref 10–24)
BUN: 29 mg/dL — ABNORMAL HIGH (ref 8–27)
CO2: 25 mmol/L (ref 20–29)
Calcium: 10.5 mg/dL — ABNORMAL HIGH (ref 8.6–10.2)
Chloride: 98 mmol/L (ref 96–106)
Creatinine, Ser: 2.28 mg/dL — ABNORMAL HIGH (ref 0.76–1.27)
Glucose: 157 mg/dL — ABNORMAL HIGH (ref 70–99)
Potassium: 4.4 mmol/L (ref 3.5–5.2)
Sodium: 138 mmol/L (ref 134–144)
eGFR: 29 mL/min/{1.73_m2} — ABNORMAL LOW (ref >59)

## 2023-01-18 NOTE — Telephone Encounter (Signed)
-----   Message from Dyann Kief, PA-C sent at 01/18/2023  7:39 AM EDT ----- Kidney function higher. Please stop lasix and repeat bmet next week

## 2023-01-18 NOTE — Telephone Encounter (Signed)
Patient returned RN's call. 

## 2023-01-18 NOTE — Telephone Encounter (Signed)
  Loa Socks, LPN 4/69/6295  2:84 PM EDT Back to Top    The patient has been notified of the result and verbalized understanding.  All questions (if any) were answered.   Pt aware to stop taking lasix and come in for a repeat BMET in one week to reassess his kidney function.   Scheduled the pt for repeat BMET here in the office for next Wednesday 01/25/23.   Pt verbalized understanding and agrees with this plan.   Dyann Kief, PA-C 01/18/2023  7:39 AM EDT     Kidney function higher. Please stop lasix and repeat bmet next week    Pt wanted to call and confirm he understood instructions I provided him just shortly ago about stopping his lasix and coming in on 6/26 for repeat BMET due to elevated renal function.   Reiterated to the pt again that he is to STOP lasix and come in for repeat lab work to check a bmet on 6/26.   Pt verbalized understanding and agrees with this plan.  He verbally read back instructions to me over the phone.  Pt was gracious for all the assistance provided.

## 2023-01-18 NOTE — Telephone Encounter (Signed)
The patient has been notified of the result and verbalized understanding.  All questions (if any) were answered.  Pt aware to stop taking lasix and come in for a repeat BMET in one week to reassess his kidney function.   Scheduled the pt for repeat BMET here in the office for next Wednesday 01/25/23.   Pt verbalized understanding and agrees with this plan.

## 2023-01-21 ENCOUNTER — Other Ambulatory Visit: Payer: Self-pay | Admitting: Family

## 2023-01-21 DIAGNOSIS — U071 COVID-19: Secondary | ICD-10-CM | POA: Diagnosis not present

## 2023-01-23 ENCOUNTER — Other Ambulatory Visit: Payer: Self-pay

## 2023-01-23 MED ORDER — VENLAFAXINE HCL ER 150 MG PO CP24: 150.0000 mg | ORAL_CAPSULE | Freq: Every day | ORAL | 1 refills | Status: DC

## 2023-01-23 NOTE — Telephone Encounter (Signed)
High dose warning came up when trying to refill medication.  Medication pended and sent to [facility]

## 2023-01-25 ENCOUNTER — Ambulatory Visit: Payer: Medicare Other

## 2023-01-26 DIAGNOSIS — I509 Heart failure, unspecified: Secondary | ICD-10-CM | POA: Diagnosis not present

## 2023-01-27 ENCOUNTER — Ambulatory Visit: Payer: Medicare Other | Attending: Physician Assistant

## 2023-02-05 DIAGNOSIS — R053 Chronic cough: Secondary | ICD-10-CM | POA: Diagnosis not present

## 2023-02-12 NOTE — Progress Notes (Signed)
Cardiology Office Note:    Date:  02/14/2023   ID:  Johnny [name], DOB 1945/10/25, MRN [id number]  PCP:  Caesar Bookman, NP  Cardiologist:  Little Ishikawa, MD  Electrophysiologist:  None   Referring MD: Caesar Bookman, NP   Chief Complaint  Patient presents with   Coronary Artery Disease    History of Present Illness:    Johnny [name] is a 77 y.o. male with a hx of CAD status post CABG in 2015, moderate aortic stenosis, diastolic heart failure, OSA, atrial fibrillation, symptomatic bradycardia status post PPM who presents for hospital follow-up.  He was admitted 11/2021 with chest pain.  He had rmoved from [place] in 09/2021.  Echocardiogram 12/15/2021 showed EF 55 to 60%, low normal RV function, mild to moderate left atrial enlargement, small pericardial effusion, moderate aortic stenosis.  Lexiscan Myoview on 12/16/2021 showed no ischemia or infarction, EF 58%.  Echocardiogram 06/2022 showed EF 65 to 70%, normal RV function, moderate aortic stenosis (mean gradient 17 mmHg, V-max 2.9 m/s, VTI 1.2 cm, DI 0.37).  He was admitted 10/2022 with chest pain.  Echo 11/16/2022 showed EF 65 to 70%, grade 2 diastolic dysfunction, mild RV systolic dysfunction, moderate aortic stenosis.  LHC 11/17/2022 showed patent LIMA-LAD, occluded SVG-OM, patent mid to distal LCx stent, occluded distal LCx.  Since last clinic visit, he reports he is continuing to have chest pain and shortness of breath.  Symptoms occur with minimal exertion.  Has lightheadedness but denies any syncope.  Reports does have lower extremity edema, was told to stop Lasix but restarted and is taking every other day.  He is taking Eliquis, denies any bleeding issues.   Wt Readings from Last 3 Encounters:  02/14/23 217 lb 3.2 oz (98.5 kg)  01/03/23 200 lb (90.7 kg)  11/16/22 204 lb 12.9 oz (92.9 kg)     Past Medical History:  Diagnosis Date   Atrial fibrillation (HCC)    Deafness in left ear    Hypertension     Symptomatic bradycardia 04/2021   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[place], [place]    Past Surgical History:  Procedure Laterality Date   BACK SURGERY  1975   Lower Back Surgery, Dr.Seltzer   BACK SURGERY  1985   Dr.Seltzer   LEFT HEART CATH AND CORS/GRAFTS ANGIOGRAPHY N/A 11/17/2022   Procedure: LEFT HEART CATH AND CORS/GRAFTS ANGIOGRAPHY;  Surgeon: Corky Crafts, MD;  Location: [facility];  Service: Cardiovascular;  Laterality: N/A;   SHOULDER SURGERY Left 1965   Dr.Seltzer   TONSILLECTOMY  1950   Dr.Perellie    Current Medications: Current Meds  Medication Sig   albuterol (VENTOLIN HFA) 108 (90 Base) MCG/ACT inhaler Inhale 1 puff into the lungs every 6 (six) hours as needed for wheezing or shortness of breath.   carvedilol (COREG) 6.25 MG tablet TAKE 1 TABLET BY MOUTH TWICE  DAILY   cyanocobalamin (VITAMIN B12) 1000 MCG tablet Take 1 tablet (1,000 mcg total) by mouth daily.   ELIQUIS 5 MG TABS tablet TAKE 1 TABLET BY MOUTH TWICE  DAILY   famotidine (PEPCID) 40 MG tablet Take 40 mg by mouth daily.   fluticasone (FLONASE) 50 MCG/ACT nasal spray Place 2 sprays into both nostrils 3 (three) times daily as needed for allergies or rhinitis.   loratadine (CLARITIN) 10 MG tablet Take 1 tablet (10 mg total) by mouth daily.   montelukast (SINGULAIR) 10 MG tablet Take 10 mg by mouth daily as needed (for allergies).  nitroGLYCERIN (NITROSTAT) 0.4 MG SL tablet Place 1 tablet (0.4 mg total) under the tongue every 5 (five) minutes as needed for chest pain.   pantoprazole (PROTONIX) 40 MG tablet Take 1 tablet (40 mg total) by mouth daily before breakfast.   ranolazine (RANEXA) 500 MG 12 hr tablet Take 1 tablet (500 mg total) by mouth 2 (two) times daily.   rosuvastatin (CRESTOR) 40 MG tablet TAKE 1 TABLET BY MOUTH DAILY   sodium chloride (OCEAN) 0.65 % SOLN nasal spray Place 1 spray into both nostrils as needed for congestion.   TYLENOL 8 HOUR ARTHRITIS PAIN 650 MG CR tablet Take 650-1,300 mg by mouth  every 8 (eight) hours as needed for pain.   venlafaxine XR (EFFEXOR-XR) 150 MG 24 hr capsule Take 1 capsule (150 mg total) by mouth daily with breakfast.     Allergies:   Acacia and Metformin and related   Social History   Socioeconomic History   Marital status: Widowed    Spouse name: Not on file   Number of children: Not on file   Years of education: Not on file   Highest education level: Not on file  Occupational History   Not on file  Tobacco Use   Smoking status: Former    Current packs/day: 1.00    Average packs/day: 1 pack/day for 10.0 years (10.0 ttl pk-yrs)    Types: Cigarettes   Smokeless tobacco: Never  Substance and Sexual Activity   Alcohol use: Never   Drug use: Never   Sexual activity: Not on file  Other Topics Concern   Not on file  Social History Narrative   Tobacco use, amount per day now: None   Past tobacco use, amount per day: 1 pack   How many years did you use tobacco: 10 years.   Alcohol use (drinks per week): None   Diet:   Do you drink/eat things with caffeine: Yes   Marital status:  Widow                                What year were you married? 1972   Do you live in a house, apartment, assisted living, condo, trailer, etc.? House   Is it one or more stories? One   How many persons live in your home? One   Do you have pets in your home?( please list) No   Highest Level of education completed? 12th grade.   Current or past profession: Psychologist, occupational, Investment banker, operational, Clinical biochemist.    Do you exercise?   No                               Type and how often?   Do you have a living will? Yes   Do you have a DNR form?     No                              If not, do you want to discuss one?   Do you have signed POA/HPOA forms?  Yes                      If so, please bring to you appointment      Do you have any difficulty bathing or dressing yourself? No   Do you have any difficulty preparing  food or eating? No   Do you have any difficulty managing your  medications? No   Do you have any difficulty managing your finances? Yes   Do you have any difficulty affording your medications? No   Social Determinants of Health   Financial Resource Strain: Not on file  Food Insecurity: No Food Insecurity (11/17/2022)   Hunger Vital Sign    Worried About Running Out of Food in the Last Year: Never true    Ran Out of Food in the Last Year: Never true  Transportation Needs: No Transportation Needs (11/17/2022)   PRAPARE - Administrator, Civil Service (Medical): No    Lack of Transportation (Non-Medical): No  Physical Activity: Not on file  Stress: Not on file  Social Connections: Unknown (07/11/2020)   Received from [facility], [facility]   Social Connections    Frequency of Communication with Friends and Family: Not asked    Frequency of Social Gatherings with Friends and Family: Not asked     Family History: The patient's family history includes Cancer in his father; Stroke in his brother and sister.  ROS:   Please see the history of present illness.     All other systems reviewed and are negative.  EKGs/Labs/Other Studies Reviewed:    The following studies were reviewed today:   EKG:   12/29/2021: Atrial paced rhythm, rate 60, diffuse less than 1 mm ST depressions 11/10/2022: Atrial paced rhythm, rate 63, nonspecific T wave flattening 02/14/2023: Atrial paced rhythm, rate 60, diffuse less than 1 mm ST depressions  Recent Labs: 11/02/2022: TSH 1.900 11/15/2022: ALT 30; B Natriuretic Peptide 75.9; Magnesium 1.9 11/18/2022: Hemoglobin 14.2; Platelets 175 01/17/2023: BUN 29; Creatinine, Ser 2.28; Potassium 4.4; Sodium 138  Recent Lipid Panel    Component Value Date/Time   CHOL 158 10/10/2022 1510   TRIG 154 (H) 10/10/2022 1510   HDL 70 10/10/2022 1510   CHOLHDL 2.3 10/10/2022 1510   VLDL 49 (H) 06/19/2022 1519   LDLCALC 64 10/10/2022 1510    Physical Exam:    VS:  BP 112/82 (BP Location: Right Arm, Patient Position:  Sitting, Cuff Size: Normal)   Pulse 60   Ht 5\' 6"  (1.676 m)   Wt 217 lb 3.2 oz (98.5 kg)   SpO2 94%   BMI 35.06 kg/m     Wt Readings from Last 3 Encounters:  02/14/23 217 lb 3.2 oz (98.5 kg)  01/03/23 200 lb (90.7 kg)  11/16/22 204 lb 12.9 oz (92.9 kg)     GEN:  Well nourished, well developed in no acute distress HEENT: Normal NECK: No JVD; No carotid bruits CARDIAC: RRR, 2/6 systolic murmur RESPIRATORY:  Clear to auscultation without rales, wheezing or rhonchi  ABDOMEN: Soft, non-tender, non-distended MUSCULOSKELETAL:  No edema; No deformity  SKIN: Warm and dry NEUROLOGIC:  Alert and oriented x 3 PSYCHIATRIC:  Normal affect   ASSESSMENT:    1. CAD in native artery   2. Essential hypertension   3. Aortic valve stenosis, etiology of cardiac valve disease unspecified   4. Chronic heart failure with preserved ejection fraction (HFpEF) (HCC)   5. Paroxysmal atrial fibrillation (HCC)   6. SSS (sick sinus syndrome) (HCC)   7. AKI (acute kidney injury) (HCC)   8. OSA (obstructive sleep apnea)      PLAN:    CAD: status post CABG.  Coronary angiography 06/07/21: "normal left main with severe mid-LAD disease and patent LIMA graft to LAD but with slow flow  in the distal graft with possible filling defect vs streaming; in either case, the distal vessel is very small, too small for PCI. There were patent stents in second obtuse marginal branch. Distal circumflex was chronically occluded with right to left collaterals. Sequential SVG to two OM branches was chronically occluded. RCA contained a widely patent stent in proximal portion".  Echocardiogram 12/15/2021 showed EF 55 to 60%, low normal RV function, mild to moderate left atrial enlargement, small pericardial effusion, moderate aortic stenosis.  Lexiscan Myoview on 12/16/2021 showed no ischemia or infarction, EF 58%. LHC 11/17/2022 showed patent LIMA-LAD, occluded SVG-OM, patent mid to distal LCx stent, occluded distal LCx. -Continue  aspirin, statin -Carvedilol 6.25 mg twice daily   Aortic stenosis: Moderate on echocardiogram 12/15/2021.  Repeat echo during hospitalization 06/2022 showed EF 65 to 70%, normal RV function, moderate aortic stenosis (mean gradient 17 mmHg, V-max 2.9 m/s, VTI 1.2 cm, DI 0.37).  Echocardiogram during hospitalization 10/2022 showed moderate aortic stenosis.   Chronic diastolic heart failure: Lasix held due to worsening kidney function.  Appears euvolemic.  Reports she has continued to take Lasix every other day, recommend holding.  Check BMET, magnesium   Atrial fibrillation: On Eliquis 5 mg twice daily and carvedilol 6.25 mg twice daily.  In atrial paced rhythm.     Symptomatic bradycardia: Status post [employer] PPM.  Referred to Dr. Royann Shivers for device follow-up   Hypertension: On carvedilol 6.25 mg twice daily.  Appears controlled   AKI on CKD stage IIIa: Baseline creatinine appears 1.5-1.8.  Creatinine 2.3 on 01/17/2023.  Likely due to overdiuresis, was instructed to stop Lasix but he reports he has continued to take.  Recommend holding Lasix and will check BMET   OSA: Severe OSA on sleep study 12/2021, needs BiPAP but was not started.  Will refer to sleep medicine   Hyperlipidemia: On rosuvastatin 40 mg daily.  LDL 40 on 06/19/2022   RTC in 4 months   Medication Adjustments/Labs and Tests Ordered: Current medicines are reviewed at length with the patient today.  Concerns regarding medicines are outlined above.  Orders Placed This Encounter  Procedures   Magnesium   Basic metabolic panel   Ambulatory referral to Pulmonology   EKG 12-Lead   No orders of the defined types were placed in this encounter.   Patient Instructions  Medication Instructions:     Please hold in taking Furosemide ( Lasix) -- meaning  do not take  medication at the current time.    *If you need a refill on your cardiac medications before your next appointment, please call your pharmacy*   Lab  Work: BMP MAGNESIUM  If you have labs (blood work) drawn today and your tests are completely normal, you will receive your results only by: MyChart Message (if you have MyChart) OR A paper copy in the mail If you have any lab test that is abnormal or we need to change your treatment, we will call you to review the results.   Testing/Procedures: Not needed   Follow-Up: At [facility], you and your health needs are our priority.  As part of our continuing mission to provide you with exceptional heart care, we have created designated Provider Care Teams.  These Care Teams include your primary Cardiologist (physician) and Advanced Practice Providers (APPs -  Physician Assistants and Nurse Practitioners) who all work together to provide you with the care you need, when you need it.     Your next appointment:   4 month(s)  The format for your next appointment:   In Person  Provider:   Little Ishikawa, MD    You have been referred to  [facility] Pulmonology- discuss option for sleep apnea     Other Instructions    Signed, Little Ishikawa, MD  02/14/2023 5:54 PM    [facility]

## 2023-02-14 ENCOUNTER — Encounter: Payer: Self-pay | Admitting: Cardiology

## 2023-02-14 ENCOUNTER — Ambulatory Visit: Payer: Medicare Other | Attending: Cardiology | Admitting: Cardiology

## 2023-02-14 ENCOUNTER — Telehealth: Payer: Self-pay

## 2023-02-14 VITALS — BP 112/82 | HR 60 | Ht 66.0 in | Wt 217.2 lb

## 2023-02-14 DIAGNOSIS — I5032 Chronic diastolic (congestive) heart failure: Secondary | ICD-10-CM | POA: Diagnosis not present

## 2023-02-14 DIAGNOSIS — I2510 Atherosclerotic heart disease of native coronary artery without angina pectoris: Secondary | ICD-10-CM

## 2023-02-14 DIAGNOSIS — I480 Paroxysmal atrial fibrillation: Secondary | ICD-10-CM

## 2023-02-14 DIAGNOSIS — G4733 Obstructive sleep apnea (adult) (pediatric): Secondary | ICD-10-CM | POA: Diagnosis not present

## 2023-02-14 DIAGNOSIS — I350 Nonrheumatic aortic (valve) stenosis: Secondary | ICD-10-CM | POA: Diagnosis not present

## 2023-02-14 DIAGNOSIS — I10 Essential (primary) hypertension: Secondary | ICD-10-CM | POA: Diagnosis not present

## 2023-02-14 DIAGNOSIS — I495 Sick sinus syndrome: Secondary | ICD-10-CM

## 2023-02-14 DIAGNOSIS — N179 Acute kidney failure, unspecified: Secondary | ICD-10-CM | POA: Diagnosis not present

## 2023-02-14 DIAGNOSIS — E1142 Type 2 diabetes mellitus with diabetic polyneuropathy: Secondary | ICD-10-CM

## 2023-02-14 MED ORDER — GLUCOSE BLOOD VI STRP: 1.0000 | ORAL_STRIP | Freq: Every day | 12 refills | Status: DC

## 2023-02-14 NOTE — Telephone Encounter (Signed)
Patient called to request test strips for his [employer] blood sugar meter. Patient is aware that I will share this request with Dinah as a glucometer is not on his active medication list.  Patient would like glucometer sent to [facility] on [address] please advise if you are in agreement with patients request and if so please advise on the frequency of checking blood sugar.

## 2023-02-14 NOTE — Telephone Encounter (Signed)
Check blood sugars once daily and record.Bring log to upcoming visit for evaluation.

## 2023-02-14 NOTE — Patient Instructions (Signed)
Medication Instructions:     Please hold in taking Furosemide ( Lasix) -- meaning  do not take  medication at the current time.    *If you need a refill on your cardiac medications before your next appointment, please call your pharmacy*   Lab Work: BMP MAGNESIUM  If you have labs (blood work) drawn today and your tests are completely normal, you will receive your results only by: MyChart Message (if you have MyChart) OR A paper copy in the mail If you have any lab test that is abnormal or we need to change your treatment, we will call you to review the results.   Testing/Procedures: Not needed   Follow-Up: At [facility], you and your health needs are our priority.  As part of our continuing mission to provide you with exceptional heart care, we have created designated Provider Care Teams.  These Care Teams include your primary Cardiologist (physician) and Advanced Practice Providers (APPs -  Physician Assistants and Nurse Practitioners) who all work together to provide you with the care you need, when you need it.     Your next appointment:   4 month(s)  The format for your next appointment:   In Person  Provider:   Little Ishikawa, MD    You have been referred to  [facility] Pulmonology- discuss option for sleep apnea     Other Instructions

## 2023-02-15 LAB — BASIC METABOLIC PANEL
BUN/Creatinine Ratio: 13 (ref 10–24)
BUN: 27 mg/dL (ref 8–27)
CO2: 26 mmol/L (ref 20–29)
Calcium: 10.7 mg/dL — ABNORMAL HIGH (ref 8.6–10.2)
Chloride: 99 mmol/L (ref 96–106)
Creatinine, Ser: 2.05 mg/dL — ABNORMAL HIGH (ref 0.76–1.27)
Glucose: 90 mg/dL (ref 70–99)
Potassium: 5.4 mmol/L — ABNORMAL HIGH (ref 3.5–5.2)
Sodium: 141 mmol/L (ref 134–144)
eGFR: 33 mL/min/{1.73_m2} — ABNORMAL LOW (ref >59)

## 2023-02-15 LAB — MAGNESIUM: Magnesium: 1.9 mg/dL (ref 1.6–2.3)

## 2023-02-16 ENCOUNTER — Other Ambulatory Visit: Payer: Self-pay | Admitting: *Deleted

## 2023-02-16 DIAGNOSIS — E875 Hyperkalemia: Secondary | ICD-10-CM

## 2023-02-17 DIAGNOSIS — U071 COVID-19: Secondary | ICD-10-CM | POA: Diagnosis not present

## 2023-02-20 DIAGNOSIS — U071 COVID-19: Secondary | ICD-10-CM | POA: Diagnosis not present

## 2023-02-21 ENCOUNTER — Ambulatory Visit: Payer: Medicare Other

## 2023-02-27 ENCOUNTER — Other Ambulatory Visit: Payer: Self-pay

## 2023-02-27 DIAGNOSIS — E875 Hyperkalemia: Secondary | ICD-10-CM

## 2023-03-02 ENCOUNTER — Telehealth: Payer: Self-pay | Admitting: *Deleted

## 2023-03-02 NOTE — Telephone Encounter (Signed)
   Pre-operative Risk Assessment    Patient Name: Johnny [name]  DOB: 11-15-1945 MRN: [id number]      Request for Surgical Clearance    Procedure:   CATARACT EXTRACTION W/INTRAOCULAR LENS IMPLANT OF THE LEFT EYE 03/30/23; THIS IS THEN FOLLOWED BY THE RIGHT EYE TO BE DONE ON 04/13/23  Date of Surgery:  Clearance 03/30/23                                 Surgeon:  DR. LISA SUN Surgeon's Group or Practice Name:  [facility] Phone number:  [phone] Fax number:  [phone]   Type of Clearance Requested:   - Medical ; PRE REQUEST THERE ARE NO MEDICATIONS NEEDING TO BE HELD   Type of Anesthesia:   TOPICAL ANESTHESIA WITH IV MEDICATION   Additional requests/questions:    Elpidio Anis   03/02/2023, 11:54 AM

## 2023-03-02 NOTE — Telephone Encounter (Signed)
   Patient Name: Johnny [name]  DOB: Mar 29, 1946 MRN: [id number]  Primary Cardiologist: Little Ishikawa, MD  Chart reviewed as part of pre-operative protocol coverage. Cataract extractions are recognized in guidelines as low risk surgeries that do not typically require specific preoperative testing or holding of blood thinner therapy. Therefore, given past medical history and time since last visit, based on ACC/AHA guidelines, Johnny [name] would be at acceptable risk for the planned procedure without further cardiovascular testing.   I will route this recommendation to the requesting party via Epic fax function and remove from pre-op pool.  Please call with questions.  Carlos Levering, NP 03/02/2023, 3:18 PM

## 2023-03-08 DIAGNOSIS — R053 Chronic cough: Secondary | ICD-10-CM | POA: Diagnosis not present

## 2023-03-20 DIAGNOSIS — U071 COVID-19: Secondary | ICD-10-CM | POA: Diagnosis not present

## 2023-03-23 DIAGNOSIS — U071 COVID-19: Secondary | ICD-10-CM | POA: Diagnosis not present

## 2023-03-30 DIAGNOSIS — H2512 Age-related nuclear cataract, left eye: Secondary | ICD-10-CM | POA: Diagnosis not present

## 2023-03-31 DIAGNOSIS — H2512 Age-related nuclear cataract, left eye: Secondary | ICD-10-CM | POA: Diagnosis not present

## 2023-03-31 DIAGNOSIS — H2511 Age-related nuclear cataract, right eye: Secondary | ICD-10-CM | POA: Diagnosis not present

## 2023-04-04 ENCOUNTER — Other Ambulatory Visit: Payer: Self-pay

## 2023-04-04 DIAGNOSIS — E875 Hyperkalemia: Secondary | ICD-10-CM

## 2023-04-04 DIAGNOSIS — Z79899 Other long term (current) drug therapy: Secondary | ICD-10-CM

## 2023-04-04 DIAGNOSIS — N1832 Chronic kidney disease, stage 3b: Secondary | ICD-10-CM

## 2023-04-04 DIAGNOSIS — R7989 Other specified abnormal findings of blood chemistry: Secondary | ICD-10-CM

## 2023-04-10 ENCOUNTER — Encounter: Payer: Medicare Other | Admitting: Family

## 2023-04-12 ENCOUNTER — Encounter: Payer: Medicare Other | Admitting: Family

## 2023-04-13 DIAGNOSIS — H25011 Cortical age-related cataract, right eye: Secondary | ICD-10-CM | POA: Diagnosis not present

## 2023-04-13 DIAGNOSIS — H2511 Age-related nuclear cataract, right eye: Secondary | ICD-10-CM | POA: Diagnosis not present

## 2023-04-13 NOTE — Progress Notes (Signed)
  This encounter was created in error - please disregard. No show 

## 2023-04-14 DIAGNOSIS — H2511 Age-related nuclear cataract, right eye: Secondary | ICD-10-CM | POA: Diagnosis not present

## 2023-04-18 ENCOUNTER — Other Ambulatory Visit: Payer: Self-pay | Admitting: Family

## 2023-04-18 NOTE — Progress Notes (Signed)
This encounter was created in error - please disregard.

## 2023-04-19 ENCOUNTER — Encounter: Payer: Self-pay | Admitting: Sports Medicine

## 2023-04-19 ENCOUNTER — Ambulatory Visit (INDEPENDENT_AMBULATORY_CARE_PROVIDER_SITE_OTHER): Payer: Medicare Other | Admitting: Sports Medicine

## 2023-04-19 ENCOUNTER — Telehealth: Payer: Self-pay

## 2023-04-19 VITALS — BP 116/82 | HR 68 | Temp 97.5°F | Resp 18 | Ht 66.0 in | Wt 222.0 lb

## 2023-04-19 DIAGNOSIS — I480 Paroxysmal atrial fibrillation: Secondary | ICD-10-CM | POA: Diagnosis not present

## 2023-04-19 DIAGNOSIS — E875 Hyperkalemia: Secondary | ICD-10-CM

## 2023-04-19 DIAGNOSIS — F418 Other specified anxiety disorders: Secondary | ICD-10-CM

## 2023-04-19 DIAGNOSIS — F322 Major depressive disorder, single episode, severe without psychotic features: Secondary | ICD-10-CM

## 2023-04-19 DIAGNOSIS — I5032 Chronic diastolic (congestive) heart failure: Secondary | ICD-10-CM | POA: Diagnosis not present

## 2023-04-19 DIAGNOSIS — N1832 Chronic kidney disease, stage 3b: Secondary | ICD-10-CM | POA: Diagnosis not present

## 2023-04-19 DIAGNOSIS — Z23 Encounter for immunization: Secondary | ICD-10-CM

## 2023-04-19 MED ORDER — MIRTAZAPINE 30 MG PO TBDP
30.0000 mg | ORAL_TABLET | Freq: Every day | ORAL | 3 refills | Status: AC
Start: 2023-04-19 — End: ?

## 2023-04-19 NOTE — Progress Notes (Signed)
Careteam: Patient Care Team: Ngetich, Donalee Citrin, NP as PCP - General (Family Medicine) Little Ishikawa, MD as PCP - Cardiology (Cardiology)  PLACE OF SERVICE:  [facility]  Advanced Directive information Does Patient Have a Medical Advance Directive?: No, Would patient like information on creating a medical advance directive?: No - Patient declined  Allergies  Allergen Reactions   Acacia Itching   Metformin And Related Other (See Comments)    Metformin caused the patient's legs to become weak    Chief Complaint  Patient presents with   Medical Management of Chronic Issues    6 month follow up.    Health Maintenance    Discuss the need for Foot exam, Hepatitis C Screening, Diabetic kidney evaluation, Eye exam, and Hemoglobin A1C.   Immunizations    Discuss the need for Covid vaccine, and Influenza vaccine. [facility] Verified.      HPI: Patient is a 77 y.o. male is here for follow up   HFpEF  Has exertional sob but no recent change Has orthopea,  Uses supplemental o2 at night  No lower extremity swelling  Denies chest pain, palpitations, dizzy or lightheaded Echo EF 58%  Saw cardiology in July     Latest Ref Rng & Units 02/14/2023    3:00 PM 01/17/2023    1:59 PM 01/03/2023    2:23 PM  BMP  Glucose 70 - 99 mg/dL 90  161  87   BUN 8 - 27 mg/dL 27  29  25    Creatinine 0.76 - 1.27 mg/dL 0.96  0.45  4.09   BUN/Creat Ratio 10 - 24 13  13  12    Sodium 134 - 144 mmol/L 141  138  142   Potassium 3.5 - 5.2 mmol/L 5.4  4.4  4.3   Chloride 96 - 106 mmol/L 99  98  101   CO2 20 - 29 mmol/L 26  25  27    Calcium 8.6 - 10.2 mg/dL 81.1  91.4  78.2     CKD stage 3b  Never seen a nephrologist   Depression  Lives alone  PHQ 9- 21 Has passive suicidal ideation, says he would drive [place], he does not have car any more  Does not guns at home On effexor 150mg   Compliant with his medications Sleeps well   Afib  No signs of bleeding  On eliquis   Review of  Systems:  Review of Systems  Constitutional:  Negative for chills and fever.  HENT:  Negative for congestion and sore throat.   Eyes:  Negative for double vision.  Respiratory:  Positive for shortness of breath (exertional SOB). Negative for cough and sputum production.   Cardiovascular:  Negative for chest pain, palpitations and leg swelling.  Gastrointestinal:  Negative for abdominal pain, heartburn and nausea.  Genitourinary:  Negative for dysuria, frequency and hematuria.  Musculoskeletal:  Negative for falls and myalgias.  Neurological:  Negative for dizziness, sensory change and focal weakness.  Psychiatric/Behavioral:  Positive for depression. The patient does not have insomnia.     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[place], [place]   Past Surgical History:  Procedure Laterality Date   BACK SURGERY  1975   Lower Back Surgery, Dr.Seltzer   BACK SURGERY  1985   Dr.Seltzer   LEFT HEART CATH  AND CORS/GRAFTS ANGIOGRAPHY N/A 11/17/2022   Procedure: LEFT HEART CATH AND CORS/GRAFTS ANGIOGRAPHY;  Surgeon: Corky Crafts, MD;  Location: [facility] INVASIVE CV LAB;  Service: Cardiovascular;  Laterality: N/A;   SHOULDER SURGERY Left 1965   Dr.Seltzer   TONSILLECTOMY  1950   Dr.Perellie   Social History:   reports that he has quit smoking. His smoking use included cigarettes. He has a 10 pack-year smoking history. He has never used smokeless tobacco. He reports current alcohol use of about 1.0 standard drink of alcohol per week. He reports that he does not use drugs.  Family History  Problem Relation Age of Onset   Cancer Father    Stroke Sister    Stroke Brother        x 2    Medications: Patient's Medications  New Prescriptions   No medications on file  Previous Medications   ALBUTEROL (VENTOLIN HFA) 108 (90 BASE) MCG/ACT INHALER    Inhale 1 puff into the  lungs every 6 (six) hours as needed for wheezing or shortness of breath.   CARVEDILOL (COREG) 6.25 MG TABLET    TAKE 1 TABLET BY MOUTH TWICE  DAILY   CYANOCOBALAMIN (VITAMIN B12) 1000 MCG TABLET    Take 1 tablet (1,000 mcg total) by mouth daily.   ELIQUIS 5 MG TABS TABLET    TAKE 1 TABLET BY MOUTH TWICE  DAILY   FAMOTIDINE (PEPCID) 40 MG TABLET    Take 40 mg by mouth daily.   FLUTICASONE (FLONASE) 50 MCG/ACT NASAL SPRAY    Place 2 sprays into both nostrils 3 (three) times daily as needed for allergies or rhinitis.   GLUCOSE BLOOD TEST STRIP    1 each by Other route daily. [facility] Glucometer   LORATADINE (CLARITIN) 10 MG TABLET    Take 1 tablet (10 mg total) by mouth daily.   MONTELUKAST (SINGULAIR) 10 MG TABLET    Take 10 mg by mouth daily as needed (for allergies).   NITROGLYCERIN (NITROSTAT) 0.4 MG SL TABLET    Place 1 tablet (0.4 mg total) under the tongue every 5 (five) minutes as needed for chest pain.   PANTOPRAZOLE (PROTONIX) 40 MG TABLET    TAKE 1 TABLET BY MOUTH DAILY  BEFORE BREAKFAST   RANOLAZINE (RANEXA) 500 MG 12 HR TABLET    Take 1 tablet (500 mg total) by mouth 2 (two) times daily.   ROSUVASTATIN (CRESTOR) 40 MG TABLET    TAKE 1 TABLET BY MOUTH DAILY   SODIUM CHLORIDE (OCEAN) 0.65 % SOLN NASAL SPRAY    Place 1 spray into both nostrils as needed for congestion.   TYLENOL 8 HOUR ARTHRITIS PAIN 650 MG CR TABLET    Take 650-1,300 mg by mouth every 8 (eight) hours as needed for pain.   VENLAFAXINE XR (EFFEXOR-XR) 150 MG 24 HR CAPSULE    Take 1 capsule (150 mg total) by mouth daily with breakfast.  Modified Medications   No medications on file  Discontinued Medications   No medications on file    Physical Exam:  Vitals:   04/19/23 1410  BP: 116/82  Pulse: 68  Resp: 18  Temp: (!) 97.5 F (36.4 C)  SpO2: 95%  Weight: 222 lb (100.7 kg)  Height: 5\' 6"  (1.676 m)   Body mass index is 35.83 kg/m. Wt Readings from Last 3 Encounters:  04/19/23 222 lb (100.7 kg)  02/14/23  217 lb 3.2 oz (98.5 kg)  01/03/23 200 lb (90.7 kg)    Physical  Exam Constitutional:      Appearance: Normal appearance.  HENT:     Head: Normocephalic and atraumatic.  Cardiovascular:     Rate and Rhythm: Normal rate and regular rhythm.     Pulses: Normal pulses.     Heart sounds: Normal heart sounds.  Pulmonary:     Effort: No respiratory distress.     Breath sounds: No stridor. No wheezing or rales.  Abdominal:     General: Bowel sounds are normal. There is no distension.     Palpations: Abdomen is soft.     Tenderness: There is no abdominal tenderness. There is no right CVA tenderness or guarding.  Musculoskeletal:        General: No swelling.  Neurological:     Mental Status: He is alert. Mental status is at baseline.     Sensory: No sensory deficit.     Motor: No weakness.      Labs reviewed: Basic Metabolic Panel: Recent Labs    10/10/22 1510 10/10/22 1510 11/02/22 1037 11/10/22 1244 11/15/22 1816 11/16/22 1435 01/03/23 1423 01/17/23 1359 02/14/23 1500  NA 141   < >  --  142 137   < > 142 138 141  K 4.1  --   --  5.3* 4.0   < > 4.3 4.4 5.4*  CL 102  --   --  101 99   < > 101 98 99  CO2 28  --   --  27 26   < > 27 25 26   GLUCOSE 77   < >  --  85 86   < > 87 157* 90  BUN 25   < >  --  17 27*   < > 25 29* 27  CREATININE 1.90*  --   --  1.76* 2.02*   < > 2.02* 2.28* 2.05*  CALCIUM 10.9*  --   --  10.8* 10.4*   < > 10.6* 10.5* 10.7*  MG  --   --   --  1.8 1.9  --   --   --  1.9  TSH 1.27  --  1.900  --   --   --   --   --   --    < > = values in this interval not displayed.   Liver Function Tests: Recent Labs    09/13/22 1907 10/10/22 1510 11/15/22 1816  AST 28 24 29   ALT 27 25 30   ALKPHOS 69  --  65  BILITOT 0.8 0.5 0.7  PROT 6.9 7.3 7.1  ALBUMIN 3.7  --  3.8   Recent Labs    11/15/22 1816  LIPASE 37   No results for input(s): "AMMONIA" in the last 8760 hours. CBC: Recent Labs    09/15/22 1549 10/10/22 1510 11/15/22 1816 11/17/22 0218  11/18/22 0225  WBC 7.1 9.5 7.8 5.7 6.3  NEUTROABS 5,957 6,679 5.2  --   --   HGB 14.6 16.1 14.8 15.4 14.2  HCT 43.7 49.5 45.8 46.6 43.6  MCV 86.2 86.4 89.3 86.3 88.1  PLT 156 200 228 187 175   Lipid Panel: Recent Labs    06/19/22 1519 10/10/22 1510  CHOL 148 158  HDL 59 70  LDLCALC 40 64  TRIG 247* 154*  CHOLHDL 2.5 2.3   TSH: Recent Labs    10/10/22 1510 11/02/22 1037  TSH 1.27 1.900   A1C: Lab Results  Component Value Date   HGBA1C 6.0 (H) 10/10/2022  Assessment/Plan  1. Paroxysmal atrial fibrillation (HCC)  No signs of bleeding  Cont with eliquis , coreg   Hyperkalemia  Will repeat bmp - Basic Metabolic Panel with eGFR - Ambulatory referral to Nephrology   Current severe episode of major depressive disorder without psychotic features without prior episode (HCC)  Phq9 -21  Does not have active plan  Informed patient that he needs to go to ED if he has active suicidal ideation,  Will refer to behavioral health, psychiatry Will start rameron  Cont with effexor Follow up in 6 weeks  - mirtazapine (REMERON SOL-TAB) 30 MG disintegrating tablet; Take 1 tablet (30 mg total) by mouth at bedtime.  Dispense: 90 tablet; Refill: 3 - Ambulatory referral to Psychiatry   Stage 3b chronic kidney disease (HCC)  Will check bmp Will refer to nephrology - Basic Metabolic Panel with eGFR  Chronic heart failure with preserved ejection fraction (HFpEF) (HCC)  Euvolemic on exam  Cont with ranexa, statin, coreg  Avoid salty foods      Need for influenza vaccination   - Flu Vaccine Trivalent High Dose (Fluad)   No follow-ups on file.: 6 weeks

## 2023-04-19 NOTE — Telephone Encounter (Signed)
Patient was calling just to see if he needed lab work for today's visit

## 2023-04-20 DIAGNOSIS — U071 COVID-19: Secondary | ICD-10-CM | POA: Diagnosis not present

## 2023-04-20 LAB — BASIC METABOLIC PANEL WITH GFR
BUN/Creatinine Ratio: 12 (calc) (ref 6–22)
BUN: 24 mg/dL (ref 7–25)
CO2: 24 mmol/L (ref 20–32)
Calcium: 10.8 mg/dL — ABNORMAL HIGH (ref 8.6–10.3)
Chloride: 101 mmol/L (ref 98–110)
Creat: 1.95 mg/dL — ABNORMAL HIGH (ref 0.70–1.28)
Glucose, Bld: 83 mg/dL (ref 65–99)
Potassium: 4.3 mmol/L (ref 3.5–5.3)
Sodium: 139 mmol/L (ref 135–146)
eGFR: 35 mL/min/{1.73_m2} — ABNORMAL LOW (ref >=60)

## 2023-04-23 DIAGNOSIS — U071 COVID-19: Secondary | ICD-10-CM | POA: Diagnosis not present

## 2023-05-16 ENCOUNTER — Institutional Professional Consult (permissible substitution): Payer: Medicare Other | Admitting: Adult Health

## 2023-05-20 DIAGNOSIS — U071 COVID-19: Secondary | ICD-10-CM | POA: Diagnosis not present

## 2023-05-23 ENCOUNTER — Ambulatory Visit: Payer: Medicare Other

## 2023-05-23 DIAGNOSIS — U071 COVID-19: Secondary | ICD-10-CM | POA: Diagnosis not present

## 2023-05-24 DIAGNOSIS — I5032 Chronic diastolic (congestive) heart failure: Secondary | ICD-10-CM | POA: Diagnosis not present

## 2023-05-24 DIAGNOSIS — N1832 Chronic kidney disease, stage 3b: Secondary | ICD-10-CM | POA: Diagnosis not present

## 2023-05-24 DIAGNOSIS — I480 Paroxysmal atrial fibrillation: Secondary | ICD-10-CM | POA: Diagnosis not present

## 2023-05-24 DIAGNOSIS — I129 Hypertensive chronic kidney disease with stage 1 through stage 4 chronic kidney disease, or unspecified chronic kidney disease: Secondary | ICD-10-CM | POA: Diagnosis not present

## 2023-05-24 LAB — COMPREHENSIVE METABOLIC PANEL
Albumin: 4.2 (ref 3.5–5.0)
Calcium: 10.6 (ref 8.7–10.7)
eGFR: 40

## 2023-05-24 LAB — BASIC METABOLIC PANEL
BUN: 20 (ref 4–21)
CO2: 32 — AB (ref 13–22)
Chloride: 101 (ref 99–108)
Creatinine: 1.8 — AB (ref 0.6–1.3)
Glucose: 86
Potassium: 3.9 meq/L (ref 3.5–5.1)
Sodium: 140 (ref 137–147)

## 2023-05-24 LAB — PROTEIN / CREATININE RATIO, URINE
Albumin, U: 26.8
Creatinine, Urine: 173.5

## 2023-05-25 ENCOUNTER — Other Ambulatory Visit: Payer: Self-pay | Admitting: Internal Medicine

## 2023-05-25 DIAGNOSIS — N1832 Chronic kidney disease, stage 3b: Secondary | ICD-10-CM

## 2023-05-30 ENCOUNTER — Ambulatory Visit (INDEPENDENT_AMBULATORY_CARE_PROVIDER_SITE_OTHER): Payer: Medicare Other | Admitting: Primary Care

## 2023-05-30 ENCOUNTER — Encounter: Payer: Self-pay | Admitting: Primary Care

## 2023-05-30 VITALS — BP 130/80 | HR 61 | Resp 18 | Ht 66.0 in | Wt 223.8 lb

## 2023-05-30 DIAGNOSIS — I480 Paroxysmal atrial fibrillation: Secondary | ICD-10-CM

## 2023-05-30 DIAGNOSIS — G4733 Obstructive sleep apnea (adult) (pediatric): Secondary | ICD-10-CM

## 2023-05-30 NOTE — Patient Instructions (Addendum)
Needs BIPAP titration study, this will be done at the [facility] - they will call you to set up date/time    Recommendations: - Once you get BIPAP aim to wear nightly 4-6 hours  - Focus on side sleeping position or elevate head with wedge pillow 30 degrees - Work on weight loss efforts if able  - Do not drive if experiencing excessive daytime sleepiness of fatigue    Orders: - BIPAP titration study (Call 1-2 weeks after having sleep study to get results, we will then place order for BIPAP)   Follow-up: - 3 months with Beth NP for PAP compliance   CPAP and BIPAP Information CPAP and BIPAP are methods that use air pressure to keep your airways open and to help you breathe well. CPAP and BIPAP use different amounts of pressure. Your health care provider will tell you whether CPAP or BIPAP would be more helpful for you. CPAP stands for "continuous positive airway pressure." With CPAP, the amount of pressure stays the same while you breathe in (inhale) and out (exhale). BIPAP stands for "bi-level positive airway pressure." With BIPAP, the amount of pressure will be higher when you inhale and lower when you exhale. This allows you to take larger breaths. CPAP or BIPAP may be used in the hospital, or your health care provider may want you to use it at home. You may need to have a sleep study before your health care provider can order a machine for you to use at home. What are the advantages? CPAP or BIPAP can be helpful if you have: Sleep apnea. Chronic obstructive pulmonary disease (COPD). Heart failure. Medical conditions that cause muscle weakness, including muscular dystrophy or amyotrophic lateral sclerosis (ALS). Other problems that cause breathing to be shallow, weak, abnormal, or difficult. CPAP and BIPAP are most commonly used for obstructive sleep apnea (OSA) to keep the airways from collapsing when the muscles relax during sleep. What are the risks? Generally, this is a safe  treatment. However, problems may occur, including: Irritated skin or skin sores if the mask does not fit properly. Dry or stuffy nose or nosebleeds. Dry mouth. Feeling gassy or bloated. Sinus or lung infection if the equipment is not cleaned properly. When should CPAP or BIPAP be used? In most cases, the mask only needs to be worn during sleep. Generally, the mask needs to be worn throughout the night and during any daytime naps. People with certain medical conditions may also need to wear the mask at other times, such as when they are awake. Follow instructions from your health care provider about when to use the machine. What happens during CPAP or BIPAP?  Both CPAP and BIPAP are provided by a small machine with a flexible plastic tube that attaches to a plastic mask that you wear. Air is blown through the mask into your nose or mouth. The amount of pressure that is used to blow the air can be adjusted on the machine. Your health care provider will set the pressure setting and help you find the best mask for you. Tips for using the mask Because the mask needs to be snug, some people feel trapped or closed-in (claustrophobic) when first using the mask. If you feel this way, you may need to get used to the mask. One way to do this is to hold the mask loosely over your nose or mouth and then gradually apply the mask more snugly. You can also gradually increase the amount of time that  you use the mask. Masks are available in various types and sizes. If your mask does not fit well, talk with your health care provider about getting a different one. Some common types of masks include: Full face masks, which fit over the mouth and nose. Nasal masks, which fit over the nose. Nasal pillow or prong masks, which fit into the nostrils. If you are using a mask that fits over your nose and you tend to breathe through your mouth, a chin strap may be applied to help keep your mouth closed. Use a skin barrier to  protect your skin as told by your health care provider. Some CPAP and BIPAP machines have alarms that may sound if the mask comes off or develops a leak. If you have trouble with the mask, it is very important that you talk with your health care provider about finding a way to make the mask easier to tolerate. Do not stop using the mask. There could be a negative impact on your health if you stop using the mask. Tips for using the machine Place your CPAP or BIPAP machine on a secure table or stand near an electrical outlet. Know where the on/off switch is on the machine. Follow instructions from your health care provider about how to set the pressure on your machine and when you should use it. Do not eat or drink while the CPAP or BIPAP machine is on. Food or fluids could get pushed into your lungs by the pressure of the CPAP or BIPAP. For home use, CPAP and BIPAP machines can be rented or purchased through home health care companies. Many different brands of machines are available. Renting a machine before purchasing may help you find out which particular machine works well for you. Your health insurance company may also decide which machine you may get. Keep the CPAP or BIPAP machine and attachments clean. Ask your health care provider for specific instructions. Check the humidifier if you have a dry stuffy nose or nosebleeds. Make sure it is working correctly. Follow these instructions at home: Take over-the-counter and prescription medicines only as told by your health care provider. Ask if you can take sinus medicine if your sinuses are blocked. Do not use any products that contain nicotine or tobacco. These products include cigarettes, chewing tobacco, and vaping devices, such as e-cigarettes. If you need help quitting, ask your health care provider. Keep all follow-up visits. This is important. Contact a health care provider if: You have redness or pressure sores on your head, face, mouth, or  nose from the mask or head gear. You have trouble using the CPAP or BIPAP machine. You cannot tolerate wearing the CPAP or BIPAP mask. Someone tells you that you snore even when wearing your CPAP or BIPAP. Get help right away if: You have trouble breathing. You feel confused. Summary CPAP and BIPAP are methods that use air pressure to keep your airways open and to help you breathe well. If you have trouble with the mask, it is very important that you talk with your health care provider about finding a way to make the mask easier to tolerate. Do not stop using the mask. There could be a negative impact to your health if you stop using the mask. Follow instructions from your health care provider about when to use the machine. This information is not intended to replace advice given to you by your health care provider. Make sure you discuss any questions you have with your health  care provider. Document Revised: 02/24/2021 Document Reviewed: 06/26/2020 Elsevier Patient Education  2023 [employer].

## 2023-05-30 NOTE — Assessment & Plan Note (Addendum)
-   Patient has symptoms of loud snoring and excessive daytime sleepiness.  Epworth score 23.  Referred by cardiology.  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  Reviewed sleep study results and risks of untreated sleep apnea.  Patient needs BiPAP titration study and in agreement with plan. Once he is back on PAP therapy and compliant with use we can provide him a letter for work.    Recommendations: - Once you get BIPAP aim to wear nightly 4-6 hours  - Focus on side sleeping position or elevate head with wedge pillow 30 degrees - Work on weight loss efforts if able  - Do not drive if experiencing excessive daytime sleepiness of fatigue    Orders: - BIPAP titration study    Follow-up: - 3 months with Beth NP for PAP compliance

## 2023-05-30 NOTE — Progress Notes (Signed)
@Patient  ID: Johnny [name], male    DOB: [birth year], 77 y.o.   MRN: [id number]  Chief Complaint  Patient presents with   Consult    Sleep consult.     Referring provider: Ngetich, Donalee Citrin, NP  HPI: 77 year old male, former smoker.  Past medical history significant for heart failure, coronary artery disease, hypertension, NSTEMI, A-fib, aortic stenosis, chronic respiratory failure with hypoxia, OSA, chronic kidney disease, hyperlipidemia.  05/30/2023- Interim hx  Patient presents today for sleep consult. Patient was referred by cardiology.  Patient has symptoms of loud snoring and excessive daytime sleepiness. He was noted to have severe obstructive sleep apnea on sleep study from 12/2021, AHI 60.5/hr. Ordered for BiPAP titration study but never completed. Reviewed sleep study results today. He is agreeing to have BIPAP titration study done and re-starting PAP therapy. He wears oxygen at night. Not currently on CPAP.   He would like a letter stating that due to his sleep apnea he was not getting enough sleep causing daytime sleepiness and that he can return to work at [employer] once he is back using BIPAP regularly.   Sleep questionnaire Symptoms-   loud snoring, restless sleep  Prior sleep study-he said 3 previous sleep studies Bedtime-11 PM Time to fall asleep-at times he is not able to fall asleep Nocturnal awakenings-3-5 times Out of bed/start of day-2 AM/ sometimes noon  Weight changes-10 pounds Do you operate heavy machinery-no Do you currently wear CPAP-no Do you current wear oxygen-at night Epworth-23  Social Hx: Patient is widowed. No children. Retired. Drinks wine at dinner. He quit smoking in 1985.  Allergies  Allergen Reactions   Acacia Itching   Metformin And Related Other (See Comments)    Metformin caused the patient's legs to become weak    Immunization History  Administered Date(s) Administered   Fluad Quad(high Dose 65+) 05/06/2020, 06/22/2022    Fluad Trivalent(High Dose 65+) 04/19/2023   Influenza Split 09/15/2009, 05/03/2013   Influenza, High Dose Seasonal PF 06/05/2014, 04/20/2015, 06/08/2016, 04/22/2017, 04/15/2021   Influenza-Unspecified 07/14/2004, 05/07/2007, 05/02/2008, 05/01/2018, 04/29/2019   PPD Test 06/08/2021   Pneumococcal Conjugate-13 06/05/2014   Pneumococcal Polysaccharide-23 08/13/2012   Tdap 11/04/2019    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     carvedilol (COREG) 6.25 MG tablet TAKE 1 TABLET BY MOUTH TWICE  DAILY 200 tablet 2   cyanocobalamin (VITAMIN B12) 1000 MCG tablet Take 1 tablet (1,000 mcg total) by mouth daily. 90 tablet 1   ELIQUIS 5 MG TABS tablet TAKE 1 TABLET BY MOUTH TWICE  DAILY 200 tablet 2   famotidine (PEPCID) 40 MG tablet Take 40 mg by mouth daily.     fluticasone (FLONASE) 50 MCG/ACT nasal spray Place 2 sprays into both nostrils 3 (three) times daily as needed for allergies or rhinitis.  glucose blood test strip 1 each by Other route daily. [employer] Glucometer 100 each 12   loratadine (CLARITIN) 10 MG tablet Take 1 tablet (10 mg total) by mouth daily. 30 tablet 11   mirtazapine (REMERON SOL-TAB) 30 MG disintegrating tablet Take 1 tablet (30 mg total) by mouth at bedtime. 90 tablet 3   montelukast (SINGULAIR) 10 MG tablet Take 10 mg by mouth daily as needed (for allergies).      nitroGLYCERIN (NITROSTAT) 0.4 MG SL tablet Place 1 tablet (0.4 mg total) under the tongue every 5 (five) minutes as needed for chest pain. 25 tablet 3   pantoprazole (PROTONIX) 40 MG tablet TAKE 1 TABLET BY MOUTH DAILY  BEFORE BREAKFAST 100 tablet 2   ranolazine (RANEXA) 500 MG 12 hr tablet Take 1 tablet (500 mg total) by mouth 2 (two) times daily. 180 tablet 3   rosuvastatin (CRESTOR) 40 MG tablet TAKE 1 TABLET BY MOUTH DAILY 100 tablet 2   sodium chloride (OCEAN) 0.65 % SOLN nasal spray Place 1 spray into both nostrils as needed for congestion. 88 mL 3   TYLENOL 8 HOUR ARTHRITIS PAIN 650 MG CR tablet Take 650-1,300 mg by mouth every 8 (eight) hours as needed for pain.     venlafaxine XR (EFFEXOR-XR) 150 MG 24 hr capsule Take 1 capsule (150 mg total) by mouth daily with breakfast. 90 capsule 1   No facility-administered medications prior to visit.   Review of Systems  Review of Systems  Constitutional:  Positive for fatigue.  HENT:  Positive for congestion and postnasal drip.    Physical Exam  BP 130/80   Pulse 61   Resp 18   Ht 5\' 6"  (1.676 m)   Wt 223 lb 12.8 oz (101.5 kg)   SpO2 96%   BMI 36.12 kg/m  Physical Exam Constitutional:      General: He is not in acute distress.    Appearance: Normal appearance. He is obese. He is not ill-appearing.  HENT:     Head: Normocephalic and atraumatic.  Cardiovascular:     Rate and Rhythm: Normal rate and regular rhythm.  Pulmonary:     Effort: Pulmonary effort is normal.     Breath sounds: Normal breath sounds.  Skin:    General: Skin is warm and dry.  Neurological:     General: No focal deficit present.     Mental Status: He is alert and oriented to person, place, and time. Mental status is at baseline.  Psychiatric:        Mood and Affect: Mood normal.        Behavior: Behavior normal.        Thought Content: Thought content normal.        Judgment: Judgment normal.      Lab Results:  CBC    Component Value Date/Time    WBC 6.3 11/18/2022 0225   RBC 4.95 11/18/2022 0225   HGB 14.2 11/18/2022 0225   HGB 15.2 12/29/2021 0944   HCT 43.6 11/18/2022 0225   HCT 44.7 12/29/2021 0944   PLT 175 11/18/2022 0225   PLT 232 12/29/2021 0944   MCV 88.1 11/18/2022 0225   MCV 82 12/29/2021 0944   MCH 28.7 11/18/2022 0225   MCHC 32.6 11/18/2022 0225   RDW 17.6 (H) 11/18/2022 0225   RDW 18.5 (H) 12/29/2021 0944   LYMPHSABS 1.5 11/15/2022 1816   MONOABS 0.9 11/15/2022 1816   EOSABS 0.1 11/15/2022 1816   BASOSABS 0.0 11/15/2022 1816  BMET    Component Value Date/Time   NA 139 04/19/2023 1443   NA 141 02/14/2023 1500   K 4.3 04/19/2023 1443   CL 101 04/19/2023 1443   CO2 24 04/19/2023 1443   GLUCOSE 83 04/19/2023 1443   BUN 24 04/19/2023 1443   BUN 27 02/14/2023 1500   CREATININE 1.95 (H) 04/19/2023 1443   CALCIUM 10.8 (H) 04/19/2023 1443   GFRNONAA 43 (L) 11/18/2022 0715    BNP    Component Value Date/Time   BNP 75.9 11/15/2022 1800    ProBNP No results found for: "PROBNP"  Imaging: No results found.   Assessment & Plan:   OSA (obstructive sleep apnea) - Patient has symptoms of loud snoring and excessive daytime sleepiness.  Epworth score 23.  Referred by cardiology.  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  Reviewed sleep study results and risks of untreated sleep apnea.  Patient needs BiPAP titration study and in agreement with plan. Once he is back on PAP therapy and compliant with use we can provide him a letter for work.    Recommendations: - Once you get BIPAP aim to wear nightly 4-6 hours  - Focus on side sleeping position or elevate head with wedge pillow 30 degrees - Work on weight loss efforts if able  - Do not drive if experiencing excessive daytime sleepiness of fatigue    Orders: - BIPAP titration study    Follow-up: - 3 months with Beth NP for PAP compliance   Glenford Bayley, NP 05/30/2023

## 2023-05-31 ENCOUNTER — Ambulatory Visit (INDEPENDENT_AMBULATORY_CARE_PROVIDER_SITE_OTHER): Payer: Medicare Other | Admitting: Adult Health

## 2023-05-31 ENCOUNTER — Encounter: Payer: Self-pay | Admitting: Adult Health

## 2023-05-31 VITALS — BP 122/78 | HR 70 | Temp 97.4°F | Resp 18 | Ht 66.0 in | Wt 224.0 lb

## 2023-05-31 DIAGNOSIS — K219 Gastro-esophageal reflux disease without esophagitis: Secondary | ICD-10-CM | POA: Diagnosis not present

## 2023-05-31 DIAGNOSIS — R7303 Prediabetes: Secondary | ICD-10-CM

## 2023-05-31 DIAGNOSIS — R21 Rash and other nonspecific skin eruption: Secondary | ICD-10-CM

## 2023-05-31 DIAGNOSIS — I25118 Atherosclerotic heart disease of native coronary artery with other forms of angina pectoris: Secondary | ICD-10-CM

## 2023-05-31 DIAGNOSIS — I480 Paroxysmal atrial fibrillation: Secondary | ICD-10-CM

## 2023-05-31 DIAGNOSIS — F322 Major depressive disorder, single episode, severe without psychotic features: Secondary | ICD-10-CM

## 2023-05-31 DIAGNOSIS — I5032 Chronic diastolic (congestive) heart failure: Secondary | ICD-10-CM

## 2023-05-31 MED ORDER — TRIAMCINOLONE ACETONIDE 0.5 % EX OINT: 1.0000 | TOPICAL_OINTMENT | Freq: Two times a day (BID) | CUTANEOUS | 0 refills | Status: AC

## 2023-05-31 NOTE — Progress Notes (Signed)
[facility]  Provider:  Kenard Gower DNP  Code Status:  Full Code  Goals of Care:     04/19/2023    2:15 PM  Advanced Directives  Does Patient Have a Medical Advance Directive? No  Would patient like information on creating a medical advance directive? No - Patient declined     Chief Complaint  Patient presents with   Follow-up    6 weeks    HPI: Patient is a 77 y.o. male seen today for a 6-week follow-up of chronic medical issues.  Current severe episode of major depressive disorder without psychotic features without prior episode (HCC) -PHQ-9 score 23, currently takes venlafaxine and Remeron  Coronary artery disease of native artery of native heart with stable angina pectoris (HCC)  -   denies chest pain, takes Ranexa, rosuvastatin and NTG PRN  Paroxysmal atrial fibrillation (HCC) -takes Eliquis and Coreg, has a pacemaker  Gastroesophageal reflux disease without esophagitis -had episode of heartburn x 1, takes famotidine and Protonix  Chronic diastolic HF (heart failure) (HCC)  - has occasional SOB when walking,  takes Coreg     Wt Readings from Last 3 Encounters:  05/31/23 224 lb (101.6 kg)  05/30/23 223 lb 12.8 oz (101.5 kg)  04/19/23 222 lb (100.7 kg)     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[place], [place]    Past Surgical History:  Procedure Laterality Date   BACK SURGERY  1975   Lower Back Surgery, Dr.Seltzer   BACK SURGERY  1985   Dr.Seltzer   LEFT HEART CATH AND CORS/GRAFTS ANGIOGRAPHY N/A 11/17/2022   Procedure: LEFT HEART CATH AND CORS/GRAFTS ANGIOGRAPHY;  Surgeon: Corky Crafts, MD;  Location: [facility];  Service: Cardiovascular;  Laterality: N/A;   SHOULDER SURGERY Left 1965   Dr.Seltzer   TONSILLECTOMY  1950   Dr.Perellie    Allergies  Allergen Reactions   Acacia Itching   Metformin And Related  Other (See Comments)    Metformin caused the patient's legs to become weak    Outpatient Encounter Medications as of 05/31/2023  Medication Sig   albuterol (VENTOLIN HFA) 108 (90 Base) MCG/ACT inhaler Inhale 1 puff into the lungs every 6 (six) hours as needed for wheezing or shortness of breath.   carvedilol (COREG) 6.25 MG tablet TAKE 1 TABLET BY MOUTH TWICE  DAILY   cyanocobalamin (VITAMIN B12) 1000 MCG tablet Take 1 tablet (1,000 mcg total) by mouth daily.   ELIQUIS 5 MG TABS tablet TAKE 1 TABLET BY MOUTH TWICE  DAILY   famotidine (PEPCID) 40 MG tablet Take 40 mg by mouth daily.   fluticasone (FLONASE) 50 MCG/ACT nasal spray Place 2 sprays into both nostrils 3 (three) times daily as needed for allergies or rhinitis.   glucose blood test strip 1 each by Other route daily. [employer] Glucometer   loratadine (CLARITIN) 10 MG tablet Take 1 tablet (10 mg total) by mouth daily.   mirtazapine (REMERON SOL-TAB) 30 MG disintegrating tablet Take 1 tablet (30 mg total) by mouth at bedtime.   montelukast (SINGULAIR) 10 MG tablet Take 10 mg by mouth daily as needed (for allergies).   nitroGLYCERIN (NITROSTAT) 0.4 MG SL tablet Place 1 tablet (0.4 mg total) under the tongue every 5 (five) minutes as needed for chest pain.   pantoprazole (PROTONIX) 40 MG tablet  TAKE 1 TABLET BY MOUTH DAILY  BEFORE BREAKFAST   ranolazine (RANEXA) 500 MG 12 hr tablet Take 1 tablet (500 mg total) by mouth 2 (two) times daily.   rosuvastatin (CRESTOR) 40 MG tablet TAKE 1 TABLET BY MOUTH DAILY   sodium chloride (OCEAN) 0.65 % SOLN nasal spray Place 1 spray into both nostrils as needed for congestion.   TYLENOL 8 HOUR ARTHRITIS PAIN 650 MG CR tablet Take 650-1,300 mg by mouth every 8 (eight) hours as needed for pain.   venlafaxine XR (EFFEXOR-XR) 150 MG 24 hr capsule Take 1 capsule (150 mg total) by mouth daily with breakfast.   No facility-administered encounter medications on file as of 05/31/2023.    Review of  Systems:  Review of Systems  Constitutional:  Negative for activity change, appetite change and fever.  HENT:  Negative for sore throat.   Eyes: Negative.   Respiratory:  Positive for shortness of breath.   Cardiovascular:  Negative for chest pain and leg swelling.  Gastrointestinal:  Negative for abdominal distention, diarrhea and vomiting.  Genitourinary:  Negative for dysuria, frequency and urgency.  Skin:  Negative for color change.  Neurological:  Negative for dizziness and headaches.  Psychiatric/Behavioral:  Negative for behavioral problems and sleep disturbance. The patient is not nervous/anxious.        Feels depressed    Health Maintenance  Topic Date Due   COVID-19 Vaccine (1) Never done   Hepatitis C Screening  Never done   OPHTHALMOLOGY EXAM  09/01/2022   HEMOGLOBIN A1C  04/12/2023   Zoster Vaccines- Shingrix (1 of 2) 11/07/2023 (Originally 05/16/1965)   Diabetic kidney evaluation - Urine ACR  04/18/2028 (Originally [birth year]/16[birth year]1964)   Medicare Annual Wellness (AWV)  12/01/2023   Diabetic kidney evaluation - eGFR measurement  04/18/2024   DTaP/Tdap/Td (2 - Td or Tdap) 11/03/2029   Pneumonia Vaccine 47+ Years old  Completed   INFLUENZA VACCINE  Completed   HPV VACCINES  Aged Out   FOOT EXAM  Discontinued    Physical Exam: Vitals:   05/31/23 1359  BP: 122/78  Pulse: 70  Resp: 18  Temp: (!) 97.4 F (36.3 C)  SpO2: 95%  Weight: 224 lb (101.6 kg)  Height: 5\' 6"  (1.676 m)   Body mass index is 36.15 kg/m. Physical Exam Constitutional:      General: He is not in acute distress.    Appearance: He is obese.  HENT:     Head: Normocephalic and atraumatic.     Mouth/Throat:     Mouth: Mucous membranes are moist.  Eyes:     Conjunctiva/sclera: Conjunctivae normal.  Cardiovascular:     Rate and Rhythm: Normal rate and regular rhythm.     Pulses: Normal pulses.     Heart sounds: Normal heart sounds.  Pulmonary:     Effort: Pulmonary effort is normal.     Breath  sounds: Normal breath sounds.  Abdominal:     General: Bowel sounds are normal.     Palpations: Abdomen is soft.  Musculoskeletal:        General: No swelling. Normal range of motion.     Cervical back: Normal range of motion.  Skin:    General: Skin is warm and dry.     Findings: Rash present.     Comments: Slightly erythematous rashes, minimal, on back of neck  Neurological:     General: No focal deficit present.     Mental Status: He is alert and oriented to person, place,  and time.  Psychiatric:        Mood and Affect: Mood normal.        Behavior: Behavior normal.        Thought Content: Thought content normal.        Judgment: Judgment normal.     Labs reviewed: Basic Metabolic Panel: Recent Labs    10/10/22 1510 11/02/22 1037 11/10/22 1244 11/15/22 1816 11/16/22 1435 01/17/23 1359 02/14/23 1500 04/19/23 1443  NA 141  --  142 137   < > 138 141 139  K 4.1  --  5.3* 4.0   < > 4.4 5.4* 4.3  CL 102  --  101 99   < > 98 99 101  CO2 28  --  27 26   < > 25 26 24   GLUCOSE 77  --  85 86   < > 157* 90 83  BUN 25  --  17 27*   < > 29* 27 24  CREATININE 1.90*  --  1.76* 2.02*   < > 2.28* 2.05* 1.95*  CALCIUM 10.9*  --  10.8* 10.4*   < > 10.5* 10.7* 10.8*  MG  --   --  1.8 1.9  --   --  1.9  --   TSH 1.27 1.900  --   --   --   --   --   --    < > = values in this interval not displayed.   Liver Function Tests: Recent Labs    09/13/22 1907 10/10/22 1510 11/15/22 1816  AST 28 24 29   ALT 27 25 30   ALKPHOS 69  --  65  BILITOT 0.8 0.5 0.7  PROT 6.9 7.3 7.1  ALBUMIN 3.7  --  3.8   Recent Labs    11/15/22 1816  LIPASE 37   No results for input(s): "AMMONIA" in the last 8760 hours. CBC: Recent Labs    09/15/22 1549 10/10/22 1510 11/15/22 1816 11/17/22 0218 11/18/22 0225  WBC 7.1 9.5 7.8 5.7 6.3  NEUTROABS 5,957 6,679 5.2  --   --   HGB 14.6 16.1 14.8 15.4 14.2  HCT 43.7 49.5 45.8 46.6 43.6  MCV 86.2 86.4 89.3 86.3 88.1  PLT 156 200 228 187 175   Lipid  Panel: Recent Labs    06/19/22 1519 10/10/22 1510  CHOL 148 158  HDL 59 70  LDLCALC 40 64  TRIG 247* 154*  CHOLHDL 2.5 2.3   Lab Results  Component Value Date   HGBA1C 6.0 (H) 10/10/2022    Procedures since last visit: No results found.  Assessment/Plan  1. Current severe episode of major depressive disorder without psychotic features without prior episode (HCC) -   PHQ-9 23, ranging as severe depression -    Denies plans to hurt self -    Continue venlafaxine and Remeron -     Ambulatory referral to Psychiatry  2. Coronary artery disease of native artery of native heart with stable angina pectoris (HCC) -   Denies chest pain, stable -    Continue Ranexa  3. Paroxysmal atrial fibrillation (HCC) -    Rate controlled -    Continue Eliquis for anticoagulation and Coreg for rate control - Basic Metabolic Panel with eGFR - CBC with Differential/Platelets  4. Gastroesophageal reflux disease without esophagitis -    Stable -    Continue Protonix and famotidine  5. Prediabetes -    Diet controlled -    Hemoglobin A1C  6. Chronic  diastolic HF (heart failure) (HCC) -    Not on diuretic due to worsening kidney function -    Follows up with cardiology  7. Rash and nonspecific skin eruption -  rash on back of neck - triamcinolone ointment (KENALOG) 0.5 %; Apply 1 Application topically 2 (two) times daily for 14 days.  Dispense: 28 g; Refill: 0    Labs/tests ordered:  A1C, BMP and CBC   Next appt:  Visit date not found

## 2023-06-01 ENCOUNTER — Ambulatory Visit
Admission: RE | Admit: 2023-06-01 | Discharge: 2023-06-01 | Disposition: A | Discharge status: Home / Self Care | Payer: Medicare Other | Source: Ambulatory Visit | Attending: Internal Medicine | Admitting: Internal Medicine

## 2023-06-01 DIAGNOSIS — N1832 Chronic kidney disease, stage 3b: Secondary | ICD-10-CM

## 2023-06-01 LAB — CBC WITH DIFFERENTIAL/PLATELET
Absolute Lymphocytes: 1217 {cells}/uL (ref 850–3900)
Absolute Monocytes: 530 {cells}/uL (ref 200–950)
Basophils Absolute: 31 {cells}/uL (ref 0–200)
Basophils Relative: 0.4 %
Eosinophils Absolute: 203 {cells}/uL (ref 15–500)
Eosinophils Relative: 2.6 %
HCT: 46.1 % (ref 38.5–50.0)
Hemoglobin: 14.7 g/dL (ref 13.2–17.1)
MCH: 28.6 pg (ref 27.0–33.0)
MCHC: 31.9 g/dL — ABNORMAL LOW (ref 32.0–36.0)
MCV: 89.7 fL (ref 80.0–100.0)
MPV: 10.8 fL (ref 7.5–12.5)
Monocytes Relative: 6.8 %
Neutro Abs: 5819 {cells}/uL (ref 1500–7800)
Neutrophils Relative %: 74.6 %
Platelets: 185 10*3/uL (ref 140–400)
RBC: 5.14 10*6/uL (ref 4.20–5.80)
RDW: 16.6 % — ABNORMAL HIGH (ref 11.0–15.0)
Total Lymphocyte: 15.6 %
WBC: 7.8 10*3/uL (ref 3.8–10.8)

## 2023-06-01 LAB — BASIC METABOLIC PANEL WITH GFR
BUN/Creatinine Ratio: 11 (calc) (ref 6–22)
BUN: 19 mg/dL (ref 7–25)
CO2: 29 mmol/L (ref 20–32)
Calcium: 10.5 mg/dL — ABNORMAL HIGH (ref 8.6–10.3)
Chloride: 104 mmol/L (ref 98–110)
Creat: 1.67 mg/dL — ABNORMAL HIGH (ref 0.70–1.28)
Glucose, Bld: 147 mg/dL — ABNORMAL HIGH (ref 65–99)
Potassium: 4.4 mmol/L (ref 3.5–5.3)
Sodium: 141 mmol/L (ref 135–146)
eGFR: 42 mL/min/{1.73_m2} — ABNORMAL LOW (ref >=60)

## 2023-06-01 LAB — HEMOGLOBIN A1C
Hgb A1c MFr Bld: 6 %{Hb} — ABNORMAL HIGH (ref <5.7)
Mean Plasma Glucose: 126 mg/dL
eAG (mmol/L): 7 mmol/L

## 2023-06-05 ENCOUNTER — Encounter: Payer: Self-pay | Admitting: Family

## 2023-06-06 NOTE — Progress Notes (Signed)
-     A1c 6.0, same as 7 months ago, ranging as prediabetic -    No anemia, platelet count normal -    Kidney function stable

## 2023-06-14 ENCOUNTER — Ambulatory Visit (HOSPITAL_BASED_OUTPATIENT_CLINIC_OR_DEPARTMENT_OTHER): Payer: Medicare Other | Attending: Primary Care | Admitting: Pulmonary Disease

## 2023-06-14 DIAGNOSIS — I480 Paroxysmal atrial fibrillation: Secondary | ICD-10-CM | POA: Diagnosis not present

## 2023-06-14 DIAGNOSIS — G4733 Obstructive sleep apnea (adult) (pediatric): Secondary | ICD-10-CM | POA: Diagnosis not present

## 2023-06-15 DIAGNOSIS — G4733 Obstructive sleep apnea (adult) (pediatric): Secondary | ICD-10-CM

## 2023-06-15 NOTE — Procedures (Signed)
Patient Name: Johnny [name], Johnny [name] Date: 06/14/2023 Gender: Male D.O.B: 1946-04-02 Age (years): 76 Referring Provider: Ames Dura NP Height (inches): 67 Interpreting Physician: Cyril Mourning MD, ABSM Weight (lbs): 213 RPSGT: Ulyess Mort BMI: 34 MRN: [id number] Neck Size: 19.50 <br> <br> CLINICAL INFORMATION The patient is referred for a BiPAP titration to treat sleep apnea.     sleep study from 12/2021, AHI 60.5/hr  SLEEP STUDY TECHNIQUE As per the AASM Manual for the Scoring of Sleep and Associated Events v2.3 (April 2016) with a hypopnea requiring 4% desaturations.  The channels recorded and monitored were frontal, central and occipital EEG, electrooculogram (EOG), submentalis EMG (chin), nasal and oral airflow, thoracic and abdominal wall motion, anterior tibialis EMG, snore microphone, electrocardiogram, and pulse oximetry. Bilevel positive airway pressure (BPAP) was initiated at the beginning of the study and titrated to treat sleep-disordered breathing.  MEDICATIONS Medications self-administered by patient taken the night of the study : N/A  RESPIRATORY PARAMETERS Optimal IPAP Pressure (cm): 24 AHI at Optimal Pressure (/hr) 3.4 Optimal EPAP Pressure (cm): 20   Overall Minimal O2 (%): 77.0 Minimal O2 at Optimal Pressure (%): 88.0 SLEEP ARCHITECTURE Start Time: 10:21:06 PM Stop Time: 4:54:28 AM Total Time (min): 393.4 Total Sleep Time (min): 186.5 Sleep Latency (min): 22.3 Sleep Efficiency (%): 47.4% REM Latency (min): 174.5 WASO (min): 184.6 Stage N1 (%): 42.4% Stage N2 (%): 56.3% Stage N3 (%): 0.0% Stage R (%): 1.3 Supine (%): 57.91 Arousal Index (/hr): 60.8     CARDIAC DATA The 2 lead EKG demonstrated sinus rhythm. The mean heart rate was 61.4 beats per minute. Other EKG findings include: None.   LEG MOVEMENT DATA The total Periodic Limb Movements of Sleep (PLMS) were 0. The PLMS index was 0.0. A PLMS index of <15 is considered normal in  adults.  IMPRESSIONS - An optimal PAP pressure was selected for this patient ( 24 / 20 cm of water) - Mild Central Sleep Apnea was noted during this titration (CAI = 5.8/h).  Centrals emerged with obstructive events around 14/10 & max at 18/14, the subsided at higher levels - Severe oxygen desaturations were observed during this titration (min O2 = 77.0%). - The patient snored with soft snoring volume. - No cardiac abnormalities were observed during this study. - Clinically significant periodic limb movements were not noted during this study. Arousals associated with PLMs were rare.   DIAGNOSIS - Obstructive Sleep Apnea (G47.33)   RECOMMENDATIONS - Trial of BiPAP therapy on 24/20 cm H2O with a Large size [employer] Full Face Evora Full mask and heated humidification.Alternatively trial autobipap with EPAP min 10, PS +4, IPAP max 24 - Avoid alcohol, sedatives and other CNS depressants that may worsen sleep apnea and disrupt normal sleep architecture. - Sleep hygiene should be reviewed to assess factors that may improve sleep quality. - Weight management and regular exercise should be initiated or continued. - Return to [facility] for re-evaluation after 4 weeks of therapy  [Electronically signed] 06/15/2023 09:09 PM  Cyril Mourning MD, ABSM Diplomate, American Board of Sleep Medicine NPI: [id number]

## 2023-06-18 NOTE — Progress Notes (Unsigned)
Cardiology Office Note:    Date:  06/21/2023   ID:  Johnny [name], DOB 06-16-1946, MRN [id number]  PCP:  Caesar Bookman, NP  Cardiologist:  Little Ishikawa, MD  Electrophysiologist:  None   Referring MD: Caesar Bookman, NP   Chief Complaint  Patient presents with   Aortic Stenosis    History of Present Illness:    Johnny [name] is a 77 y.o. male with a hx of CAD status post CABG in 2015, moderate aortic stenosis, diastolic heart failure, OSA, atrial fibrillation, symptomatic bradycardia status post PPM who presents for hospital follow-up.  He was admitted 11/2021 with chest pain.  He had rmoved from [place] in 09/2021.  Echocardiogram 12/15/2021 showed EF 55 to 60%, low normal RV function, mild to moderate left atrial enlargement, small pericardial effusion, moderate aortic stenosis.  Lexiscan Myoview on 12/16/2021 showed no ischemia or infarction, EF 58%.  Echocardiogram 06/2022 showed EF 65 to 70%, normal RV function, moderate aortic stenosis (mean gradient 17 mmHg, V-max 2.9 m/s, VTI 1.2 cm, DI 0.37).  He was admitted 10/2022 with chest pain.  Echo 11/16/2022 showed EF 65 to 70%, grade 2 diastolic dysfunction, mild RV systolic dysfunction, moderate aortic stenosis.  LHC 11/17/2022 showed patent LIMA-LAD, occluded SVG-OM, patent mid to distal LCx stent, occluded distal LCx.  Since last clinic visit, he reports he is doing okay.  He continues to have intermittent chest pain, reports occurs about once per week, will resolve with belching.  Also reports dyspnea on exertion.  Reports occasional dizziness, denies any syncope.  He is taking Eliquis, reports he had fall in April where he tripped over a curb but no falls since.   Wt Readings from Last 3 Encounters:  06/21/23 219 lb (99.3 kg)  06/14/23 213 lb (96.6 kg)  05/31/23 224 lb (101.6 kg)     Past Medical History:  Diagnosis Date   Atrial fibrillation (HCC)    Deafness in left ear    Hypertension    Symptomatic  bradycardia 04/2021   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[place], [place]    Past Surgical History:  Procedure Laterality Date   BACK SURGERY  1975   Lower Back Surgery, Dr.Seltzer   BACK SURGERY  1985   Dr.Seltzer   LEFT HEART CATH AND CORS/GRAFTS ANGIOGRAPHY N/A 11/17/2022   Procedure: LEFT HEART CATH AND CORS/GRAFTS ANGIOGRAPHY;  Surgeon: Corky Crafts, MD;  Location: [facility];  Service: Cardiovascular;  Laterality: N/A;   SHOULDER SURGERY Left 1965   Dr.Seltzer   TONSILLECTOMY  1950   Dr.Perellie    Current Medications: Current Meds  Medication Sig   albuterol (VENTOLIN HFA) 108 (90 Base) MCG/ACT inhaler Inhale 1 puff into the lungs every 6 (six) hours as needed for wheezing or shortness of breath.   carvedilol (COREG) 6.25 MG tablet TAKE 1 TABLET BY MOUTH TWICE  DAILY   cyanocobalamin (VITAMIN B12) 1000 MCG tablet Take 1 tablet (1,000 mcg total) by mouth daily.   ELIQUIS 5 MG TABS tablet TAKE 1 TABLET BY MOUTH TWICE  DAILY   famotidine (PEPCID) 40 MG tablet Take 40 mg by mouth daily.   fluticasone (FLONASE) 50 MCG/ACT nasal spray Place 2 sprays into both nostrils 3 (three) times daily as needed for allergies or rhinitis.   glucose blood test strip 1 each by Other route daily. [employer] Glucometer   loratadine (CLARITIN) 10 MG tablet Take 1 tablet (10 mg total) by mouth daily.   mirtazapine (REMERON SOL-TAB) 30  MG disintegrating tablet Take 1 tablet (30 mg total) by mouth at bedtime.   montelukast (SINGULAIR) 10 MG tablet Take 10 mg by mouth daily as needed (for allergies).   nitroGLYCERIN (NITROSTAT) 0.4 MG SL tablet Place 1 tablet (0.4 mg total) under the tongue every 5 (five) minutes as needed for chest pain.   pantoprazole (PROTONIX) 40 MG tablet TAKE 1 TABLET BY MOUTH DAILY  BEFORE BREAKFAST   ranolazine (RANEXA) 500 MG 12 hr tablet Take 1 tablet (500 mg total) by mouth 2 (two) times daily.   rosuvastatin (CRESTOR) 40 MG tablet TAKE 1 TABLET BY MOUTH DAILY   sodium chloride  (OCEAN) 0.65 % SOLN nasal spray Place 1 spray into both nostrils as needed for congestion.   TYLENOL 8 HOUR ARTHRITIS PAIN 650 MG CR tablet Take 650-1,300 mg by mouth every 8 (eight) hours as needed for pain.   venlafaxine XR (EFFEXOR-XR) 150 MG 24 hr capsule TAKE 1 CAPSULE BY MOUTH DAILY  WITH BREAKFAST     Allergies:   Acacia and Metformin and related   Social History   Socioeconomic History   Marital status: Widowed    Spouse name: Not on file   Number of children: Not on file   Years of education: Not on file   Highest education level: Not on file  Occupational History   Not on file  Tobacco Use   Smoking status: Former    Current packs/day: 1.00    Average packs/day: 1 pack/day for 10.0 years (10.0 ttl pk-yrs)    Types: Cigarettes   Smokeless tobacco: Never  Substance and Sexual Activity   Alcohol use: Yes    Alcohol/week: 1.0 standard drink of alcohol    Types: 1 Glasses of wine per week    Comment: occasion   Drug use: Never   Sexual activity: Not on file  Other Topics Concern   Not on file  Social History Narrative   Tobacco use, amount per day now: None   Past tobacco use, amount per day: 1 pack   How many years did you use tobacco: 10 years.   Alcohol use (drinks per week): None   Diet:   Do you drink/eat things with caffeine: Yes   Marital status:  Widow                                What year were you married? 1972   Do you live in a house, apartment, assisted living, condo, trailer, etc.? House   Is it one or more stories? One   How many persons live in your home? One   Do you have pets in your home?( please list) No   Highest Level of education completed? 12th grade.   Current or past profession: Psychologist, occupational, Investment banker, operational, Clinical biochemist.    Do you exercise?   No                               Type and how often?   Do you have a living will? Yes   Do you have a DNR form?     No                              If not, do you want to discuss one?   Do you have signed  POA/HPOA forms?  Yes                      If so, please bring to you appointment      Do you have any difficulty bathing or dressing yourself? No   Do you have any difficulty preparing food or eating? No   Do you have any difficulty managing your medications? No   Do you have any difficulty managing your finances? Yes   Do you have any difficulty affording your medications? No   Social Determinants of Health   Financial Resource Strain: Not on file  Food Insecurity: No Food Insecurity (11/17/2022)   Hunger Vital Sign    Worried About Running Out of Food in the Last Year: Never true    Ran Out of Food in the Last Year: Never true  Transportation Needs: No Transportation Needs (11/17/2022)   PRAPARE - Administrator, Civil Service (Medical): No    Lack of Transportation (Non-Medical): No  Physical Activity: Not on file  Stress: Not on file  Social Connections: Unknown (07/11/2020)   Received from [facility], [facility]   Social Connections    Frequency of Communication with Friends and Family: Not asked    Frequency of Social Gatherings with Friends and Family: Not asked     Family History: The patient's family history includes Cancer in his father; Stroke in his brother and sister.  ROS:   Please see the history of present illness.     All other systems reviewed and are negative.  EKGs/Labs/Other Studies Reviewed:    The following studies were reviewed today:   EKG:   12/29/2021: Atrial paced rhythm, rate 60, diffuse less than 1 mm ST depressions 11/10/2022: Atrial paced rhythm, rate 63, nonspecific T wave flattening 02/14/2023: Atrial paced rhythm, rate 60, diffuse less than 1 mm ST depressions  Recent Labs: 11/02/2022: TSH 1.900 11/15/2022: ALT 30; B Natriuretic Peptide 75.9 02/14/2023: Magnesium 1.9 05/31/2023: BUN 19; Creat 1.67; Hemoglobin 14.7; Platelets 185; Potassium 4.4; Sodium 141  Recent Lipid Panel    Component Value Date/Time   CHOL 158  10/10/2022 1510   TRIG 154 (H) 10/10/2022 1510   HDL 70 10/10/2022 1510   CHOLHDL 2.3 10/10/2022 1510   VLDL 49 (H) 06/19/2022 1519   LDLCALC 64 10/10/2022 1510    Physical Exam:    VS:  BP 110/80 (BP Location: Left Arm, Patient Position: Sitting, Cuff Size: Normal)   Pulse 61   Ht 5\' 5"  (1.651 m)   Wt 219 lb (99.3 kg)   SpO2 94%   BMI 36.44 kg/m     Wt Readings from Last 3 Encounters:  06/21/23 219 lb (99.3 kg)  06/14/23 213 lb (96.6 kg)  05/31/23 224 lb (101.6 kg)     GEN:  Well nourished, well developed in no acute distress HEENT: Normal NECK: No JVD; No carotid bruits CARDIAC: RRR, 2/6 systolic murmur RESPIRATORY:  Clear to auscultation without rales, wheezing or rhonchi  ABDOMEN: Soft, non-tender, non-distended MUSCULOSKELETAL:  No edema; No deformity  SKIN: Warm and dry NEUROLOGIC:  Alert and oriented x 3 PSYCHIATRIC:  Normal affect   ASSESSMENT:    1. Aortic valve stenosis, etiology of cardiac valve disease unspecified   2. CAD in native artery   3. Paroxysmal atrial fibrillation (HCC)   4. Stage 3b chronic kidney disease (HCC)   5. Essential hypertension   6. Hyperlipidemia LDL goal <70       PLAN:  CAD: status post CABG.  Coronary angiography 06/07/21: "normal left main with severe mid-LAD disease and patent LIMA graft to LAD but with slow flow in the distal graft with possible filling defect vs streaming; in either case, the distal vessel is very small, too small for PCI. There were patent stents in second obtuse marginal branch. Distal circumflex was chronically occluded with right to left collaterals. Sequential SVG to two OM branches was chronically occluded. RCA contained a widely patent stent in proximal portion".  Echocardiogram 12/15/2021 showed EF 55 to 60%, low normal RV function, mild to moderate left atrial enlargement, small pericardial effusion, moderate aortic stenosis.  Lexiscan Myoview on 12/16/2021 showed no ischemia or infarction, EF 58%.  LHC 11/17/2022 showed patent LIMA-LAD, occluded SVG-OM, patent mid to distal LCx stent, occluded distal LCx. -Continue aspirin, statin -Carvedilol 6.25 mg twice daily   Aortic stenosis: Moderate on echocardiogram 12/15/2021.  Repeat echo during hospitalization 06/2022 showed EF 65 to 70%, normal RV function, moderate aortic stenosis (mean gradient 17 mmHg, V-max 2.9 m/s, VTI 1.2 cm, DI 0.37).  Echocardiogram during hospitalization 10/2022 showed moderate aortic stenosis. -Repeat echocardiogram 10/2023 to monitor   Chronic diastolic heart failure: Lasix held due to worsening kidney function.  Currently appears euvolemic off Lasix.   Atrial fibrillation: On Eliquis 5 mg twice daily and carvedilol 6.25 mg twice daily.  In atrial paced rhythm.     Symptomatic bradycardia: Status post [employer] PPM.  Referred to Dr. Royann Shivers for device follow-up   Hypertension: On carvedilol 6.25 mg twice daily.  Appears controlled   CKD stage IIIb: Baseline creatinine appears 1.5-1.8.  Creatinine 1.67 on 05/31/2023   OSA: Severe OSA on sleep study 12/2021, needs BiPAP but was not started.  Referred to sleep medicine, planning to start Bipap   Hyperlipidemia: On rosuvastatin 40 mg daily.  LDL 64 on 10/10/2022   RTC in 6 months   Medication Adjustments/Labs and Tests Ordered: Current medicines are reviewed at length with the patient today.  Concerns regarding medicines are outlined above.  Orders Placed This Encounter  Procedures   ECHOCARDIOGRAM COMPLETE   No orders of the defined types were placed in this encounter.   Patient Instructions  Medication Instructions:  Continue all current medications *If you need a refill on your cardiac medications before your next appointment, please call your pharmacy*   Lab Work: none If you have labs (blood work) drawn today and your tests are completely normal, you will receive your results only by: MyChart Message (if you have MyChart) OR A paper copy  in the mail If you have any lab test that is abnormal or we need to change your treatment, we will call you to review the results.   Testing/Procedures: ECHO  Please schedule Echo in April 2025  Your physician has requested that you have an echocardiogram. Echocardiography is a painless test that uses sound waves to create images of your heart. It provides your doctor with information about the size and shape of your heart and how well your heart's chambers and valves are working. This procedure takes approximately one hour. There are no restrictions for this procedure. Please do NOT wear cologne, perfume, aftershave, or lotions (deodorant is allowed). Please arrive 15 minutes prior to your appointment time.  Please note: We ask at that you not bring children with you during ultrasound (echo/ vascular) testing. Due to room size and safety concerns, children are not allowed in the ultrasound rooms during exams. Our front office staff cannot  provide observation of children in our lobby area while testing is being conducted. An adult accompanying a patient to their appointment will only be allowed in the ultrasound room at the discretion of the ultrasound technician under special circumstances. We apologize for any inconvenience.    Follow-Up: At [facility], you and your health needs are our priority.  As part of our continuing mission to provide you with exceptional heart care, we have created designated Provider Care Teams.  These Care Teams include your primary Cardiologist (physician) and Advanced Practice Providers (APPs -  Physician Assistants and Nurse Practitioners) who all work together to provide you with the care you need, when you need it.  We recommend signing up for the patient portal called "MyChart".  Sign up information is provided on this After Visit Summary.  MyChart is used to connect with patients for Virtual Visits (Telemedicine).  Patients are able to view lab/test  results, encounter notes, upcoming appointments, etc.  Non-urgent messages can be sent to your provider as well.   To learn more about what you can do with MyChart, go to [email or web address].    Your next appointment:   6 month(s)  Provider:   Little Ishikawa, MD        Signed, Little Ishikawa, MD  06/21/2023 3:46 PM    [facility]

## 2023-06-19 ENCOUNTER — Other Ambulatory Visit: Payer: Self-pay | Admitting: Nurse Practitioner

## 2023-06-20 DIAGNOSIS — U071 COVID-19: Secondary | ICD-10-CM | POA: Diagnosis not present

## 2023-06-20 NOTE — Telephone Encounter (Signed)
High risk or very high risk warning populated when attempting to refill medication. RX request sent to PCP for review and approval if warranted.

## 2023-06-21 ENCOUNTER — Encounter: Payer: Self-pay | Admitting: Cardiology

## 2023-06-21 ENCOUNTER — Ambulatory Visit: Payer: Medicare Other | Attending: Cardiology | Admitting: Cardiology

## 2023-06-21 VITALS — BP 110/80 | HR 61 | Ht 65.0 in | Wt 219.0 lb

## 2023-06-21 DIAGNOSIS — I10 Essential (primary) hypertension: Secondary | ICD-10-CM

## 2023-06-21 DIAGNOSIS — E785 Hyperlipidemia, unspecified: Secondary | ICD-10-CM | POA: Diagnosis not present

## 2023-06-21 DIAGNOSIS — N1832 Chronic kidney disease, stage 3b: Secondary | ICD-10-CM | POA: Diagnosis not present

## 2023-06-21 DIAGNOSIS — I2510 Atherosclerotic heart disease of native coronary artery without angina pectoris: Secondary | ICD-10-CM | POA: Diagnosis not present

## 2023-06-21 DIAGNOSIS — I350 Nonrheumatic aortic (valve) stenosis: Secondary | ICD-10-CM | POA: Diagnosis not present

## 2023-06-21 DIAGNOSIS — I480 Paroxysmal atrial fibrillation: Secondary | ICD-10-CM

## 2023-06-21 NOTE — Patient Instructions (Addendum)
Medication Instructions:  Continue all current medications *If you need a refill on your cardiac medications before your next appointment, please call your pharmacy*   Lab Work: none If you have labs (blood work) drawn today and your tests are completely normal, you will receive your results only by: MyChart Message (if you have MyChart) OR A paper copy in the mail If you have any lab test that is abnormal or we need to change your treatment, we will call you to review the results.   Testing/Procedures: ECHO  Please schedule Echo in April 2025  Your physician has requested that you have an echocardiogram. Echocardiography is a painless test that uses sound waves to create images of your heart. It provides your doctor with information about the size and shape of your heart and how well your heart's chambers and valves are working. This procedure takes approximately one hour. There are no restrictions for this procedure. Please do NOT wear cologne, perfume, aftershave, or lotions (deodorant is allowed). Please arrive 15 minutes prior to your appointment time.  Please note: We ask at that you not bring children with you during ultrasound (echo/ vascular) testing. Due to room size and safety concerns, children are not allowed in the ultrasound rooms during exams. Our front office staff cannot provide observation of children in our lobby area while testing is being conducted. An adult accompanying a patient to their appointment will only be allowed in the ultrasound room at the discretion of the ultrasound technician under special circumstances. We apologize for any inconvenience.    Follow-Up: At [facility], you and your health needs are our priority.  As part of our continuing mission to provide you with exceptional heart care, we have created designated Provider Care Teams.  These Care Teams include your primary Cardiologist (physician) and Advanced Practice Providers (APPs -   Physician Assistants and Nurse Practitioners) who all work together to provide you with the care you need, when you need it.  We recommend signing up for the patient portal called "MyChart".  Sign up information is provided on this After Visit Summary.  MyChart is used to connect with patients for Virtual Visits (Telemedicine).  Patients are able to view lab/test results, encounter notes, upcoming appointments, etc.  Non-urgent messages can be sent to your provider as well.   To learn more about what you can do with MyChart, go to [email or web address].    Your next appointment:   6 month(s)  Provider:   Little Ishikawa, MD

## 2023-06-21 NOTE — Progress Notes (Signed)
Please place an order for BiPAP therapy on IPAP 24/ EPAP 20 cm H2O with a Large size [employer] Full Face Evora Full mask and heated humidification.  Needs follow up in 6-8 weeks for compliance check

## 2023-06-22 ENCOUNTER — Telehealth: Payer: Self-pay | Admitting: Primary Care

## 2023-06-22 NOTE — Telephone Encounter (Signed)
[facility] needs prescription for his oxygen listing his liters per minute and office visit notes from the last 12 months. Fax number [phone]

## 2023-06-23 DIAGNOSIS — U071 COVID-19: Secondary | ICD-10-CM | POA: Diagnosis not present

## 2023-06-26 ENCOUNTER — Telehealth: Payer: Self-pay | Admitting: Primary Care

## 2023-06-26 NOTE — Telephone Encounter (Signed)
Atc pt no answer, lvmm for pt to give the office a call back

## 2023-06-26 NOTE — Telephone Encounter (Signed)
Pt calling in bc he needs a prescription sent in for a BiPAP Machine

## 2023-06-26 NOTE — Telephone Encounter (Signed)
[facility] calling for office notes and RX for O2 again. States PT is changing vendors. This matter has been escalated to her manager because PT keeps calling. Several fax's sent.   Neysa Bonito [phone]

## 2023-06-27 ENCOUNTER — Other Ambulatory Visit: Payer: Self-pay

## 2023-06-27 DIAGNOSIS — G4733 Obstructive sleep apnea (adult) (pediatric): Secondary | ICD-10-CM

## 2023-06-27 NOTE — Progress Notes (Signed)
Sent order in for BIPAP per Ames Dura, NP.  BiPAP therapy on IPAP 24/ EPAP 20 cm H2O with a Large size [employer] Full Face Evora Full mask and heated humidification.

## 2023-06-27 NOTE — Telephone Encounter (Signed)
Called and spoke with Chrissy from [place]. Oscar La that pt was only seen here for bipap. I read lov notes and it does not state anything about pt being on oxygen. Atc pt x2 no answer, lvmm for pt to give the office a call back.

## 2023-06-28 ENCOUNTER — Telehealth: Payer: Self-pay

## 2023-06-28 NOTE — Telephone Encounter (Signed)
Franco Nones, CMA at 06/27/2023 12:19 PM  Status: Signed  Sent order in for BIPAP per Ames Dura, NP.   BiPAP therapy on IPAP 24/ EPAP 20 cm H2O with a Large size [employer] Full Face Evora Full mask and heated humidification.

## 2023-06-28 NOTE — Telephone Encounter (Signed)
Called and spoke with pt, pt states he is on o2 at night, last Ov on 10/29 was for bipap and [facility] has been calling for a o2 order. Would pt need to do a ono or does his bipap titration study show any decrease In o2 while sleeping ? Beth please advise

## 2023-06-28 NOTE — Telephone Encounter (Signed)
Pt returned call and is suppose to be on o2 24/7. Pt has a ov in January, pt states he does not use his o2 due to his nasal cannula being dirty. I advised pt if he can make it in to the office I will provide him with a new one until the situation is taken care of. He verbalized understanding nfn

## 2023-06-28 NOTE — Telephone Encounter (Signed)
He had a BIPAP titration study, supplemental oxygen was not required. He does not need to wear oxygen at night with his BIPAP. BIPAP pressure settings should be 24/20 cm H2O with a Large size [employer] Full Face Evora Full mask and heated humidification

## 2023-06-28 NOTE — Telephone Encounter (Signed)
Atc to call pt no answer, pt is on a Poc and has a home concentrator. We will not be able tp place any orders for o2 until next OV when pt does a qualifying walk. Will try to call pt again later

## 2023-07-03 ENCOUNTER — Ambulatory Visit (INDEPENDENT_AMBULATORY_CARE_PROVIDER_SITE_OTHER): Payer: Medicare Other | Admitting: Adult Health

## 2023-07-03 ENCOUNTER — Encounter: Payer: Self-pay | Admitting: Adult Health

## 2023-07-03 VITALS — BP 122/78 | HR 60 | Temp 97.4°F | Resp 18 | Ht 65.0 in | Wt 224.4 lb

## 2023-07-03 DIAGNOSIS — N1832 Chronic kidney disease, stage 3b: Secondary | ICD-10-CM | POA: Diagnosis not present

## 2023-07-03 DIAGNOSIS — I25118 Atherosclerotic heart disease of native coronary artery with other forms of angina pectoris: Secondary | ICD-10-CM | POA: Diagnosis not present

## 2023-07-03 DIAGNOSIS — I480 Paroxysmal atrial fibrillation: Secondary | ICD-10-CM

## 2023-07-03 DIAGNOSIS — I5032 Chronic diastolic (congestive) heart failure: Secondary | ICD-10-CM

## 2023-07-03 DIAGNOSIS — R7303 Prediabetes: Secondary | ICD-10-CM

## 2023-07-03 DIAGNOSIS — F322 Major depressive disorder, single episode, severe without psychotic features: Secondary | ICD-10-CM | POA: Diagnosis not present

## 2023-07-03 DIAGNOSIS — G4733 Obstructive sleep apnea (adult) (pediatric): Secondary | ICD-10-CM | POA: Diagnosis not present

## 2023-07-03 DIAGNOSIS — K219 Gastro-esophageal reflux disease without esophagitis: Secondary | ICD-10-CM

## 2023-07-03 LAB — CBC WITH DIFFERENTIAL/PLATELET
Absolute Lymphocytes: 1501 {cells}/uL (ref 850–3900)
Absolute Monocytes: 1043 {cells}/uL — ABNORMAL HIGH (ref 200–950)
Basophils Absolute: 32 {cells}/uL (ref 0–200)
Basophils Relative: 0.4 %
Eosinophils Absolute: 198 {cells}/uL (ref 15–500)
Eosinophils Relative: 2.5 %
HCT: 48 % (ref 38.5–50.0)
Hemoglobin: 15.3 g/dL (ref 13.2–17.1)
MCH: 28.2 pg (ref 27.0–33.0)
MCHC: 31.9 g/dL — ABNORMAL LOW (ref 32.0–36.0)
MCV: 88.6 fL (ref 80.0–100.0)
MPV: 11.2 fL (ref 7.5–12.5)
Monocytes Relative: 13.2 %
Neutro Abs: 5127 {cells}/uL (ref 1500–7800)
Neutrophils Relative %: 64.9 %
Platelets: 199 10*3/uL (ref 140–400)
RBC: 5.42 10*6/uL (ref 4.20–5.80)
RDW: 16.1 % — ABNORMAL HIGH (ref 11.0–15.0)
Total Lymphocyte: 19 %
WBC: 7.9 10*3/uL (ref 3.8–10.8)

## 2023-07-03 LAB — BASIC METABOLIC PANEL WITH GFR
BUN/Creatinine Ratio: 14 (calc) (ref 6–22)
BUN: 27 mg/dL — ABNORMAL HIGH (ref 7–25)
CO2: 36 mmol/L — ABNORMAL HIGH (ref 20–32)
Calcium: 11.2 mg/dL — ABNORMAL HIGH (ref 8.6–10.3)
Chloride: 100 mmol/L (ref 98–110)
Creat: 2 mg/dL — ABNORMAL HIGH (ref 0.70–1.28)
Glucose, Bld: 64 mg/dL — ABNORMAL LOW (ref 65–99)
Potassium: 4.6 mmol/L (ref 3.5–5.3)
Sodium: 143 mmol/L (ref 135–146)
eGFR: 34 mL/min/{1.73_m2} — ABNORMAL LOW (ref >=60)

## 2023-07-03 NOTE — Telephone Encounter (Signed)
This needs to go to triage

## 2023-07-03 NOTE — Progress Notes (Unsigned)
[facility] clinic  Provider:  Kenard Gower DNP  Code Status:  Full Code  Goals of Care:     06/14/2023    9:15 PM  Advanced Directives  Does Patient Have a Medical Advance Directive? Yes  Type of Estate agent of [place];Living will  Does patient want to make changes to medical advance directive? No - Patient declined  Copy of Healthcare Power of Attorney in Chart? No - copy requested     Chief Complaint  Patient presents with   Medical Management of Chronic Issues    1 month follow-up    Immunizations    Covid    Health Maintenance    Eye Exam    HPI: Patient is a 77 y.o. male seen today for a 7-month follow up of chronic medical issues.  Current severe episode of major depressive disorder without psychotic features without prior episode (HCC) -  PHQ-9 score 21, improved from 23, denies plan of hurting self, takes Venlafaxine, has appointment with psychiatry in January 2025  OSA (obstructive sleep apnea) -  awaiting for BiPAP approval  Paroxysmal atrial fibrillation (HCC) -  denies palpitations, takes Carvedilol and Eliquis  Chronic diastolic HF (heart failure) (HCC) -  walking causes him to have SOB (10 ft), not on diuretic due to worsening kidney function, follows up with cardiology  Gastroesophageal reflux disease without esophagitis -  takes Pantoprazole  Coronary artery disease of native artery of native heart with stable angina pectoris (HCC) - had chest pain this morning, didn't take NTG and chest pain went away after 10 minutes, takes Ranexa, NTG PRN and Rosuvastatin  Prediabetes -  A1C 6.0, does not take medication for diabetes   Wt Readings from Last 3 Encounters:  07/03/23 224 lb 6.4 oz (101.8 kg)  06/21/23 219 lb (99.3 kg)  06/14/23 213 lb (96.6 kg)     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[place], [place]     Past Surgical History:  Procedure Laterality Date   BACK SURGERY  1975   Lower Back Surgery, Dr.Seltzer   BACK SURGERY  1985   Dr.Seltzer   LEFT HEART CATH AND CORS/GRAFTS ANGIOGRAPHY N/A 11/17/2022   Procedure: LEFT HEART CATH AND CORS/GRAFTS ANGIOGRAPHY;  Surgeon: Corky Crafts, MD;  Location: [facility] INVASIVE CV LAB;  Service: Cardiovascular;  Laterality: N/A;   SHOULDER SURGERY Left 1965   Dr.Seltzer   TONSILLECTOMY  1950   Dr.Perellie    Allergies  Allergen Reactions   Acacia Itching   Metformin And Related Other (See Comments)    Metformin caused the patient's legs to become weak    Outpatient Encounter Medications as of 07/03/2023  Medication Sig   albuterol (VENTOLIN HFA) 108 (90 Base) MCG/ACT inhaler Inhale 1 puff into the lungs every 6 (six) hours as needed for wheezing or shortness of breath.   carvedilol (COREG) 6.25 MG tablet TAKE 1 TABLET BY MOUTH TWICE  DAILY   cyanocobalamin (VITAMIN B12) 1000 MCG tablet Take 1 tablet (1,000 mcg total) by mouth daily.   ELIQUIS 5 MG TABS tablet TAKE 1 TABLET BY MOUTH TWICE  DAILY   famotidine (PEPCID) 40 MG tablet Take 40 mg by mouth daily.   fluticasone (FLONASE) 50 MCG/ACT nasal spray Place 2 sprays into both nostrils 3 (three) times daily as needed for allergies or rhinitis.  glucose blood test strip 1 each by Other route daily. [facility] Glucometer   loratadine (CLARITIN) 10 MG tablet Take 1 tablet (10 mg total) by mouth daily.   mirtazapine (REMERON SOL-TAB) 30 MG disintegrating tablet Take 1 tablet (30 mg total) by mouth at bedtime.   montelukast (SINGULAIR) 10 MG tablet Take 10 mg by mouth daily as needed (for allergies).   nitroGLYCERIN (NITROSTAT) 0.4 MG SL tablet Place 1 tablet (0.4 mg total) under the tongue every 5 (five) minutes as needed for chest pain.   pantoprazole (PROTONIX) 40 MG tablet TAKE 1 TABLET BY MOUTH DAILY  BEFORE BREAKFAST   ranolazine (RANEXA) 500 MG 12 hr tablet Take 1 tablet (500 mg total)  by mouth 2 (two) times daily.   rosuvastatin (CRESTOR) 40 MG tablet TAKE 1 TABLET BY MOUTH DAILY   sodium chloride (OCEAN) 0.65 % SOLN nasal spray Place 1 spray into both nostrils as needed for congestion.   TYLENOL 8 HOUR ARTHRITIS PAIN 650 MG CR tablet Take 650-1,300 mg by mouth every 8 (eight) hours as needed for pain.   venlafaxine XR (EFFEXOR-XR) 150 MG 24 hr capsule TAKE 1 CAPSULE BY MOUTH DAILY  WITH BREAKFAST   No facility-administered encounter medications on file as of 07/03/2023.    Review of Systems:  Review of Systems  Constitutional:  Negative for activity change, appetite change and fever.  HENT:  Negative for sore throat.   Eyes: Negative.   Respiratory:  Positive for shortness of breath.   Cardiovascular:  Positive for chest pain. Negative for leg swelling.  Gastrointestinal:  Negative for abdominal distention, diarrhea and vomiting.  Genitourinary:  Negative for dysuria, frequency and urgency.  Skin:  Negative for color change.  Neurological:  Negative for dizziness and headaches.  Psychiatric/Behavioral:  Negative for behavioral problems and sleep disturbance. The patient is not nervous/anxious.     Health Maintenance  Topic Date Due   COVID-19 Vaccine (1) Never done   Hepatitis C Screening  Never done   OPHTHALMOLOGY EXAM  09/01/2022   Zoster Vaccines- Shingrix (1 of 2) 11/07/2023 (Originally 05/16/1965)   HEMOGLOBIN A1C  11/29/2023   Medicare Annual Wellness (AWV)  12/01/2023   Diabetic kidney evaluation - Urine ACR  05/23/2024   Diabetic kidney evaluation - eGFR measurement  05/30/2024   DTaP/Tdap/Td (2 - Td or Tdap) 11/03/2029   Pneumonia Vaccine 74+ Years old  Completed   INFLUENZA VACCINE  Completed   HPV VACCINES  Aged Out   FOOT EXAM  Discontinued    Physical Exam: Vitals:   07/03/23 1339  BP: 122/78  Pulse: 60  Resp: 18  Temp: (!) 97.4 F (36.3 C)  SpO2: 96%  Weight: 224 lb 6.4 oz (101.8 kg)  Height: 5\' 5"  (1.651 m)   Body mass index is  37.34 kg/m. Physical Exam Constitutional:      Appearance: He is obese.  HENT:     Head: Normocephalic and atraumatic.     Mouth/Throat:     Mouth: Mucous membranes are moist.  Eyes:     Conjunctiva/sclera: Conjunctivae normal.  Cardiovascular:     Rate and Rhythm: Normal rate and regular rhythm.     Pulses: Normal pulses.     Heart sounds: Murmur heard.     Comments: Has pacemaker on the left chest Pulmonary:     Effort: Pulmonary effort is normal.     Breath sounds: Normal breath sounds.  Abdominal:     General: Bowel sounds are normal.  Palpations: Abdomen is soft.  Musculoskeletal:        General: No swelling. Normal range of motion.     Cervical back: Normal range of motion.  Skin:    General: Skin is warm and dry.  Neurological:     General: No focal deficit present.     Mental Status: He is alert and oriented to person, place, and time.  Psychiatric:        Mood and Affect: Mood normal.        Behavior: Behavior normal.        Thought Content: Thought content normal.        Judgment: Judgment normal.     Labs reviewed: Basic Metabolic Panel: Recent Labs    10/10/22 1510 11/02/22 1037 11/10/22 1244 11/15/22 1816 11/16/22 1435 02/14/23 1500 04/19/23 1443 05/24/23 0000 05/31/23 1446  NA 141  --  142 137   < > 141 139 140 141  K 4.1  --  5.3* 4.0   < > 5.4* 4.3 3.9 4.4  CL 102  --  101 99   < > 99 101 101 104  CO2 28  --  27 26   < > 26 24 32* 29  GLUCOSE 77  --  85 86   < > 90 83  --  147*  BUN 25  --  17 27*   < > 27 24 20 19   CREATININE 1.90*  --  1.76* 2.02*   < > 2.05* 1.95* 1.8* 1.67*  CALCIUM 10.9*  --  10.8* 10.4*   < > 10.7* 10.8* 10.6 10.5*  MG  --   --  1.8 1.9  --  1.9  --   --   --   TSH 1.27 1.900  --   --   --   --   --   --   --    < > = values in this interval not displayed.   Liver Function Tests: Recent Labs    09/13/22 1907 10/10/22 1510 11/15/22 1816 05/24/23 0000  AST 28 24 29   --   ALT 27 25 30   --   ALKPHOS 69  --   65  --   BILITOT 0.8 0.5 0.7  --   PROT 6.9 7.3 7.1  --   ALBUMIN 3.7  --  3.8 4.2   Recent Labs    11/15/22 1816  LIPASE 37   No results for input(s): "AMMONIA" in the last 8760 hours. CBC: Recent Labs    10/10/22 1510 11/15/22 1816 11/17/22 0218 11/18/22 0225 05/31/23 1446  WBC 9.5 7.8 5.7 6.3 7.8  NEUTROABS 6,679 5.2  --   --  5,819  HGB 16.1 14.8 15.4 14.2 14.7  HCT 49.5 45.8 46.6 43.6 46.1  MCV 86.4 89.3 86.3 88.1 89.7  PLT 200 228 187 175 185   Lipid Panel: Recent Labs    10/10/22 1510  CHOL 158  HDL 70  LDLCALC 64  TRIG 154*  CHOLHDL 2.3   Lab Results  Component Value Date   HGBA1C 6.0 (H) 05/31/2023    Procedures since last visit: Sleep Study Documents  Result Date: 06/20/2023 Ordered by an unspecified provider.  Bipap titration  Result Date: 06/14/2023 Oretha Milch, MD     06/15/2023  9:10 PM Patient Name: Johnny [name], Johnny [name] Date: 06/14/2023 Gender: Male D.O.B: [birth year] Age (years): 66 Referring Provider: Ames Dura NP Height (inches): 67 Interpreting Physician: Cyril Mourning MD, ABSM Weight (lbs): 213 RPSGT:  Ulyess Mort BMI: 34 MRN: [id number] Neck Size: 19.50 <br> <br> CLINICAL INFORMATION The patient is referred for a BiPAP titration to treat sleep apnea.  sleep study from 12/2021, AHI 60.5/hr SLEEP STUDY TECHNIQUE As per the AASM Manual for the Scoring of Sleep and Associated Events v2.3 (April 2016) with a hypopnea requiring 4% desaturations. The channels recorded and monitored were frontal, central and occipital EEG, electrooculogram (EOG), submentalis EMG (chin), nasal and oral airflow, thoracic and abdominal wall motion, anterior tibialis EMG, snore microphone, electrocardiogram, and pulse oximetry. Bilevel positive airway pressure (BPAP) was initiated at the beginning of the study and titrated to treat sleep-disordered breathing. MEDICATIONS Medications self-administered by patient taken the night of the study : N/A RESPIRATORY PARAMETERS  Optimal IPAP Pressure (cm): 24 AHI at Optimal Pressure (/hr) 3.4 Optimal EPAP Pressure (cm): 20 Overall Minimal O2 (%): 77.0 Minimal O2 at Optimal Pressure (%): 88.0 SLEEP ARCHITECTURE Start Time: 10:21:06 PM Stop Time: 4:54:28 AM Total Time (min): 393.4 Total Sleep Time (min): 186.5 Sleep Latency (min): 22.3 Sleep Efficiency (%): 47.4% REM Latency (min): 174.5 WASO (min): 184.6 Stage N1 (%): 42.4% Stage N2 (%): 56.3% Stage N3 (%): 0.0% Stage R (%): 1.3 Supine (%): 57.91 Arousal Index (/hr): 60.8 CARDIAC DATA The 2 lead EKG demonstrated sinus rhythm. The mean heart rate was 61.4 beats per minute. Other EKG findings include: None. LEG MOVEMENT DATA The total Periodic Limb Movements of Sleep (PLMS) were 0. The PLMS index was 0.0. A PLMS index of <15 is considered normal in adults. IMPRESSIONS - An optimal PAP pressure was selected for this patient ( 24 / 20 cm of water) - Mild Central Sleep Apnea was noted during this titration (CAI = 5.8/h).  Centrals emerged with obstructive events around 14/10 & max at 18/14, the subsided at higher levels - Severe oxygen desaturations were observed during this titration (min O2 = 77.0%). - The patient snored with soft snoring volume. - No cardiac abnormalities were observed during this study. - Clinically significant periodic limb movements were not noted during this study. Arousals associated with PLMs were rare. DIAGNOSIS - Obstructive Sleep Apnea (G47.33) RECOMMENDATIONS - Trial of BiPAP therapy on 24/20 cm H2O with a Large size [employer] Full Face Evora Full mask and heated humidification.Alternatively trial autobipap with EPAP min 10, PS +4, IPAP max 24 - Avoid alcohol, sedatives and other CNS depressants that may worsen sleep apnea and disrupt normal sleep architecture. - Sleep hygiene should be reviewed to assess factors that may improve sleep quality. - Weight management and regular exercise should be initiated or continued. - Return to Sleep Center for re-evaluation  after 4 weeks of therapy [Electronically signed] 06/15/2023 09:09 PM Cyril Mourning MD, ABSM Diplomate, American Board of Sleep Medicine NPI: [id number]   Assessment/Plan 1. Current severe episode of major depressive disorder without psychotic features without prior episode (HCC) -  PHQ-9 score improved from 23 to 21 -   continue Venlafaxine -  follow up with psychiatry  2. OSA (obstructive sleep apnea) -  awaiting BiPAP approval -   follows up with pulmonology  3. Paroxysmal atrial fibrillation (HCC) -  rate-controlled -  continue Eliquis  4. Chronic diastolic HF (heart failure) (HCC) -  not on diuretic due to worsening kidney function -  will repeat BMP today -  follow up with cardiology  5. Gastroesophageal reflux disease without esophagitis -  stable -  continue Pantoprazole and Famotidine  6. 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 Prediabetes Lab Results  Component Value Date   HGBA1C 6.0 (H) 05/31/2023   -  diet-controlled  8. Stage 3b chronic kidney disease -  GFR 42 -  will re-check BMP   Labs/tests ordered:  BMP  Next appt:  Visit date not found

## 2023-07-03 NOTE — Telephone Encounter (Signed)
Attempted call to advise that per last ov oxygen was not mentioned. NA. LMTCB.

## 2023-07-03 NOTE — Telephone Encounter (Signed)
Need a script prior to next appt for Pt oxygen

## 2023-07-04 ENCOUNTER — Encounter: Payer: Self-pay | Admitting: Primary Care

## 2023-07-04 ENCOUNTER — Other Ambulatory Visit: Payer: Self-pay | Admitting: Adult Health

## 2023-07-04 NOTE — Telephone Encounter (Signed)
This conversation has already been discussed, pt needs OV before we can not send any scripts in. Recalling synapse, to discuss this situation again, Pt needs to be seen in a qualifying walk test. Next OV in January. Spoke with Johnny [name] again to clarify the last conversation. She verbalized understanding nfn

## 2023-07-04 NOTE — Progress Notes (Signed)
-    elevated calcium at 11.2, up from 10.5 (05/31/23), will check labs on next appointment -  not anemic

## 2023-07-08 ENCOUNTER — Other Ambulatory Visit: Payer: Self-pay | Admitting: Family

## 2023-07-08 DIAGNOSIS — I5032 Chronic diastolic (congestive) heart failure: Secondary | ICD-10-CM

## 2023-07-11 ENCOUNTER — Telehealth: Payer: Self-pay

## 2023-07-11 NOTE — Telephone Encounter (Signed)
Message left on clinical intake voicemail:   Patients current bipap supplier is now out of network and [employer] [facility] will provide patient with supplies, however an order is needed with all the items below listed:  1.) Bipap and all supplies 2.) Headgear 3.) Type of mask 4.) Filters 5.) Tubing 6.) Humidifiers 7.) Cushions  8.) Face to face prior to sleep study 9.) Face to face after sleep study  All of the above can be faxed to [phone]  Please advise if this is something we would provide or if company needs to contact patient pulmonologist

## 2023-07-11 NOTE — Telephone Encounter (Signed)
Providers reply:   Gillis Santa, NP  You1 hour ago (1:24 PM)    He follows up with [facility] for OSA. Are they suppose to send in the supplies?  I called company back and informed them to contact pulmonary

## 2023-07-14 ENCOUNTER — Telehealth: Payer: Self-pay | Admitting: Primary Care

## 2023-07-14 ENCOUNTER — Telehealth: Payer: Self-pay | Admitting: Adult Health

## 2023-07-14 NOTE — Telephone Encounter (Signed)
I spoke to [place] at [facility] and told her that the patient has a new vendor for his oxygen (no longer using [employer]), and therefore [facility] needs to write an order for [employer] to get their oxygen equipment from patient's home. Hughes Better said she would get the provider there at [facility] to write the order for [employer] oxygen equipment to be removed.

## 2023-07-14 NOTE — Telephone Encounter (Signed)
Please send a letter to [employer] to cease his 02. He found another vendor. ( His NP called req this.)

## 2023-07-14 NOTE — Telephone Encounter (Signed)
Patient called to say he no longer uses [employer] as his vendor for oxygen equipment. His [employer] insurance has chosen a different company to supply his oxygen. [employer] requires you to send a request in writing to give them permission to remove their oxygen equipment from his home.

## 2023-07-17 ENCOUNTER — Telehealth: Payer: Self-pay | Admitting: Adult Health

## 2023-07-17 NOTE — Telephone Encounter (Signed)
Patient called requesting a prescription for a muscle relaxer and a referral to orthopedics in regards to his right shoulder.

## 2023-07-19 NOTE — Telephone Encounter (Signed)
Are you agreeable to d/c patient's oxygen? Below is message from his pcp.    Baxter Hire  TL   07/14/23 11:55 AM Note I spoke to [place] at [facility] and told her that the patient has a new vendor for his oxygen (no longer using [employer]), and therefore [facility] needs to write an order for [employer] to get their oxygen equipment from patient's home. Hughes Better said she would get the provider there at [facility] to write the order for [employer] oxygen equipment to be removed.

## 2023-07-20 DIAGNOSIS — U071 COVID-19: Secondary | ICD-10-CM | POA: Diagnosis not present

## 2023-07-21 ENCOUNTER — Telehealth: Payer: Self-pay | Admitting: Adult Health

## 2023-07-21 NOTE — Telephone Encounter (Signed)
Faxed patient's last office visit note from 06/14/23 to [facility] at [facility] [phone]), per her request regarding oxygen usage needed for re-certification of stationary and portable O2.

## 2023-07-23 DIAGNOSIS — U071 COVID-19: Secondary | ICD-10-CM | POA: Diagnosis not present

## 2023-07-24 NOTE — Telephone Encounter (Signed)
Yes we can provide an order to discontinue oxygen with [employer] as long as patient has received new oxygen equipment from a third-party vendor

## 2023-07-28 NOTE — Telephone Encounter (Signed)
ATC patient x1.  No answer.  LVM to return call.  Need to verify new DME for oxygen and then send order to [employer] to d/c oxygen with [employer].  Will await return call from patient.

## 2023-08-08 ENCOUNTER — Other Ambulatory Visit: Payer: Self-pay | Admitting: Family

## 2023-08-10 DIAGNOSIS — J449 Chronic obstructive pulmonary disease, unspecified: Secondary | ICD-10-CM | POA: Diagnosis not present

## 2023-08-14 DIAGNOSIS — J449 Chronic obstructive pulmonary disease, unspecified: Secondary | ICD-10-CM | POA: Diagnosis not present

## 2023-08-15 NOTE — Telephone Encounter (Signed)
 Patient called wanting to know if an order has been written for [employer] to remove their Oxygen equipment.  I told patient that the order has to come from [facility]. He stated that he will give them a call.

## 2023-08-17 ENCOUNTER — Telehealth: Payer: Self-pay | Admitting: Primary Care

## 2023-08-17 ENCOUNTER — Telehealth: Payer: Self-pay

## 2023-08-17 NOTE — Telephone Encounter (Signed)
Patient has switched over oxygen suppliers to [employer]. [employer] [facility] needs a prescription for his oxygen. His old equipment needs to go back to [employer]. Please call and advise. [phone]

## 2023-08-17 NOTE — Telephone Encounter (Signed)
Annice Pih from [facility] and request that patient recent office note be faxed to [phone]. Paper printed and faxed.

## 2023-08-20 DIAGNOSIS — U071 COVID-19: Secondary | ICD-10-CM | POA: Diagnosis not present

## 2023-08-22 ENCOUNTER — Ambulatory Visit: Payer: Medicare Other

## 2023-08-23 DIAGNOSIS — U071 COVID-19: Secondary | ICD-10-CM | POA: Diagnosis not present

## 2023-08-23 NOTE — Telephone Encounter (Signed)
 I have left a message on their machine to have them return call. No answer at this time.

## 2023-08-29 ENCOUNTER — Ambulatory Visit (INDEPENDENT_AMBULATORY_CARE_PROVIDER_SITE_OTHER): Payer: Medicare Other | Admitting: Sports Medicine

## 2023-08-29 ENCOUNTER — Encounter: Payer: Self-pay | Admitting: Sports Medicine

## 2023-08-29 DIAGNOSIS — R5383 Other fatigue: Secondary | ICD-10-CM

## 2023-08-29 DIAGNOSIS — R42 Dizziness and giddiness: Secondary | ICD-10-CM | POA: Diagnosis not present

## 2023-08-29 DIAGNOSIS — N1832 Chronic kidney disease, stage 3b: Secondary | ICD-10-CM

## 2023-08-29 DIAGNOSIS — F5101 Primary insomnia: Secondary | ICD-10-CM

## 2023-08-29 DIAGNOSIS — K429 Umbilical hernia without obstruction or gangrene: Secondary | ICD-10-CM | POA: Diagnosis not present

## 2023-08-29 DIAGNOSIS — G4733 Obstructive sleep apnea (adult) (pediatric): Secondary | ICD-10-CM

## 2023-08-29 LAB — GLUCOSE, POCT (MANUAL RESULT ENTRY): POC Glucose: 114 mg/dL — AB (ref 70–99)

## 2023-08-29 NOTE — Telephone Encounter (Signed)
LMTCB x2

## 2023-08-29 NOTE — Progress Notes (Signed)
Careteam: Patient Care Team: Medina-Vargas, Margit Banda, NP as PCP - General (Internal Medicine) Little Ishikawa, MD as PCP - Cardiology (Cardiology) Center, [facility] Glenford Bayley, NP as Nurse Practitioner (Pulmonary Disease)  PLACE OF SERVICE:  [facility]  Advanced Directive information    Allergies  Allergen Reactions   Acacia Itching   Metformin And Related Other (See Comments)    Metformin caused the patient's legs to become weak    Chief Complaint  Patient presents with   Acute Visit    Patient c/o weakness, fatigue, and thirsty.      Discussed the use of AI scribe software for clinical note transcription with the patient, who gave verbal consent to proceed.  History of Present Illness   The patient presents with fatigue, dizziness, and increased thirst.  He has been experiencing fatigue, dizziness, and increased thirst over the past two weeks. Dizziness occurs particularly when standing up from a sitting position. He wakes up at night with a dry mouth and consumes approximately four 16-ounce bottles of water daily. No fever, vomiting, or diarrhea is reported. He confirms increased urination frequency without pain or burning during urination.  He reports history of hypoglycemia but has not checked his blood sugar levels in two years due to running out of test strips. He is not currently on any diabetes medication, having stopped metformin years ago. His last A1c check in October showed a level of 6.  He is on several medications for heart conditions, including amiodarone, Eliquis, Coreg (carvedilol 6.25 mg twice a day), isosorbide, Crestor, and Ranexa. He also takes Remeron for depression.  He has a history of chronic kidney disease, reports seeing nephrology, no records available to review.  He has a history of falls, with the most recent occurring two months ago when he tripped on a curb. He sometimes experiences balance issues, which  he attributes to being deaf in one ear. He has previously attended physical therapy for balance issues.  He has a hernia that has been present since 2015 and is getting larger, for which he is seeking surgical consultation.  He reports difficulty sleeping, both in falling asleep and staying asleep.       Review of Systems:  Review of Systems  Constitutional:  Positive for malaise/fatigue. Negative for chills and fever.  HENT:  Negative for congestion and sore throat.   Eyes:  Negative for double vision.  Respiratory:  Negative for cough, sputum production and shortness of breath.   Cardiovascular:  Negative for chest pain, palpitations and leg swelling.  Gastrointestinal:  Negative for abdominal pain, heartburn and nausea.  Genitourinary:  Positive for frequency. Negative for dysuria and hematuria.  Musculoskeletal:  Positive for falls. Negative for myalgias.  Neurological:  Positive for dizziness. Negative for sensory change and focal weakness.  Psychiatric/Behavioral:  Positive for depression. Negative for suicidal ideas. The patient has insomnia.    Negative unless indicated in HPI.   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[place], [place]   Past Surgical History:  Procedure Laterality Date   BACK SURGERY  1975   Lower Back Surgery, Dr.Seltzer   BACK SURGERY  1985   Dr.Seltzer   LEFT HEART CATH AND CORS/GRAFTS ANGIOGRAPHY N/A 11/17/2022   Procedure: LEFT HEART CATH AND CORS/GRAFTS ANGIOGRAPHY;  Surgeon: Corky Crafts, MD;  Location: [facility];  Service: Cardiovascular;  Laterality: N/A;   SHOULDER SURGERY Left 1965   Dr.Seltzer   TONSILLECTOMY  1950   Dr.Perellie   Social History:   reports that he has quit smoking. His smoking use included cigarettes. He has a 10 pack-year smoking history. He has never used smokeless tobacco. He reports  current alcohol use of about 1.0 standard drink of alcohol per week. He reports that he does not use drugs.  Family History  Problem Relation Age of Onset   Cancer Father    Stroke Sister    Stroke Brother        x 2    Medications: Patient's Medications  New Prescriptions   No medications on file  Previous Medications   ALBUTEROL (VENTOLIN HFA) 108 (90 BASE) MCG/ACT INHALER    Inhale 1 puff into the lungs every 6 (six) hours as needed for wheezing or shortness of breath.   AMIODARONE (PACERONE) 200 MG TABLET    TAKE 1 TABLET BY MOUTH DAILY   CARVEDILOL (COREG) 6.25 MG TABLET    TAKE 1 TABLET BY MOUTH TWICE  DAILY   CYANOCOBALAMIN (VITAMIN B12) 1000 MCG TABLET    Take 1 tablet (1,000 mcg total) by mouth daily.   ELIQUIS 5 MG TABS TABLET    TAKE 1 TABLET BY MOUTH TWICE  DAILY   FAMOTIDINE (PEPCID) 40 MG TABLET    Take 40 mg by mouth daily.   FLUTICASONE (FLONASE) 50 MCG/ACT NASAL SPRAY    Place 2 sprays into both nostrils 3 (three) times daily as needed for allergies or rhinitis.   FUROSEMIDE (LASIX) 40 MG TABLET    TAKE 1 TABLET BY MOUTH EVERY  OTHER DAY   GLUCOSE BLOOD TEST STRIP    1 each by Other route daily. [employer] Glucometer   ISOSORBIDE MONONITRATE (IMDUR) 30 MG 24 HR TABLET    TAKE 1 TABLET BY MOUTH DAILY   LORATADINE (CLARITIN) 10 MG TABLET    Take 1 tablet (10 mg total) by mouth daily.   MIRTAZAPINE (REMERON SOL-TAB) 30 MG DISINTEGRATING TABLET    Take 1 tablet (30 mg total) by mouth at bedtime.   MONTELUKAST (SINGULAIR) 10 MG TABLET    Take 10 mg by mouth daily as needed (for allergies).   NITROGLYCERIN (NITROSTAT) 0.4 MG SL TABLET    DISSOLVE 1 TABLET UNDER THE  TONGUE EVERY 5 MINUTES AS NEEDED FOR CHEST PAIN. MAX OF 3 TABLETS IN 15 MINUTES. CALL 911 IF PAIN  PERSISTS.   PANTOPRAZOLE (PROTONIX) 40 MG TABLET    TAKE 1 TABLET BY MOUTH DAILY  BEFORE BREAKFAST   RANOLAZINE (RANEXA) 500 MG 12 HR TABLET    Take 1 tablet (500 mg total) by mouth 2 (two) times daily.    ROSUVASTATIN (CRESTOR) 40 MG TABLET    TAKE 1 TABLET BY MOUTH DAILY   SODIUM CHLORIDE (OCEAN) 0.65 % SOLN NASAL SPRAY    Place 1 spray into both nostrils as needed for congestion.   TYLENOL 8 HOUR ARTHRITIS PAIN 650 MG CR TABLET    Take 650-1,300 mg by mouth every 8 (eight) hours as needed for pain.   VENLAFAXINE XR (EFFEXOR-XR) 150 MG 24 HR CAPSULE    TAKE 1 CAPSULE BY MOUTH DAILY  WITH BREAKFAST  Modified Medications   No medications on file  Discontinued Medications   No medications on file    Physical Exam: Vitals:   08/29/23 0815  BP: 118/80  Pulse: 66  Temp: (!) 97.3 F (36.3 C)  TempSrc: Temporal  SpO2: 94%  Weight: 221 lb (100.2 kg)  Height: 5\' 5"  (1.651 m)   Body mass index is 36.78 kg/m. BP Readings from Last 3 Encounters:  08/29/23 118/80  07/03/23 122/78  06/21/23 110/80   Wt Readings from Last 3 Encounters:  08/29/23 221 lb (100.2 kg)  07/03/23 224 lb 6.4 oz (101.8 kg)  06/21/23 219 lb (99.3 kg)    Physical Exam Constitutional:      Appearance: Normal appearance.  HENT:     Head: Normocephalic and atraumatic.  Cardiovascular:     Rate and Rhythm: Normal rate and regular rhythm.     Pulses: Normal pulses.     Heart sounds: Murmur heard.  Pulmonary:     Effort: No respiratory distress.     Breath sounds: No stridor. No wheezing or rales.  Abdominal:     General: Bowel sounds are normal. There is no distension.     Palpations: Abdomen is soft.     Tenderness: There is no abdominal tenderness. There is no right CVA tenderness or guarding.  Musculoskeletal:        General: No swelling.  Neurological:     Mental Status: He is alert. Mental status is at baseline.     Sensory: No sensory deficit.     Motor: No weakness.     Labs reviewed: Basic Metabolic Panel: Recent Labs    10/10/22 1510 11/02/22 1037 11/10/22 1244 11/15/22 1816 11/16/22 1435 02/14/23 1500 04/19/23 1443 05/24/23 0000 05/31/23 1446 07/03/23 1421  NA 141  --  142 137   <  > 141 139 140 141 143  K 4.1  --  5.3* 4.0   < > 5.4* 4.3 3.9 4.4 4.6  CL 102  --  101 99   < > 99 101 101 104 100  CO2 28  --  27 26   < > 26 24 32* 29 36*  GLUCOSE 77  --  85 86   < > 90 83  --  147* 64*  BUN 25  --  17 27*   < > 27 24 20 19  27*  CREATININE 1.90*  --  1.76* 2.02*   < > 2.05* 1.95* 1.8* 1.67* 2.00*  CALCIUM 10.9*  --  10.8* 10.4*   < > 10.7* 10.8* 10.6 10.5* 11.2*  MG  --   --  1.8 1.9  --  1.9  --   --   --   --   TSH 1.27 1.900  --   --   --   --   --   --   --   --    < > = values in this interval not displayed.   Liver Function Tests: Recent Labs    09/13/22 1907 10/10/22 1510 11/15/22 1816 05/24/23 0000  AST 28 24 29   --   ALT 27 25 30   --   ALKPHOS 69  --  65  --   BILITOT 0.8 0.5 0.7  --   PROT 6.9 7.3 7.1  --   ALBUMIN 3.7  --  3.8 4.2   Recent Labs    11/15/22 1816  LIPASE 37   No results for input(s): "AMMONIA" in the last 8760 hours. CBC: Recent Labs    11/15/22 1816 11/17/22 0218 11/18/22 0225 05/31/23 1446 07/03/23 1421  WBC 7.8   < > 6.3 7.8 7.9  NEUTROABS 5.2  --   --  4,098  5,127  HGB 14.8   < > 14.2 14.7 15.3  HCT 45.8   < > 43.6 46.1 48.0  MCV 89.3   < > 88.1 89.7 88.6  PLT 228   < > 175 185 199   < > = values in this interval not displayed.   Lipid Panel: Recent Labs    10/10/22 1510  CHOL 158  HDL 70  LDLCALC 64  TRIG 154*  CHOLHDL 2.3   TSH: Recent Labs    10/10/22 1510 11/02/22 1037  TSH 1.27 1.900   A1C: Lab Results  Component Value Date   HGBA1C 6.0 (H) 05/31/2023     Assessment/Plan 1. Hypercalcemia (Primary) - PTH, Intact (ICMA) and Ionized Calcium - Vitamin D, 25-hydroxy  2. CKD stage 3b, GFR 30-44 ml/min (HCC) Lab Results  Component Value Date   CREATININE 2.00 (H) 07/03/2023   CREATININE 1.67 (H) 05/31/2023   CREATININE 1.8 (A) 05/24/2023    - Ambulatory referral to Nephrology  3. Dizziness Bg 114 today  ortho stasis neg will check labs  - Sodium, urine, random - Cortisol  4.  Other fatigue - Basic Metabolic Panel with eGFR - CBC With Differential/Platelet - TSH - Cortisol  5. Umbilical hernia without obstruction and without gangrene Reports having a hernia since 2015, which has been increasing in size.   No tenderness on palpation  - Ambulatory referral to General Surgery  6. Primary insomnia Instructed to take melatonin   7. OSA (obstructive sleep apnea) Not currently using CPAP machine. -Advise patient to contact pulmonologist regarding CPAP use.      No follow-ups on file.: follow up with PCP    35 MINTotal time spent for obtaining history,  performing a medically appropriate examination and evaluation, reviewing the tests,ordering  tests,  documenting clinical information in the electronic or other health record,  care coordination (not separately reported)

## 2023-08-30 ENCOUNTER — Ambulatory Visit: Payer: Medicare Other | Admitting: Primary Care

## 2023-08-30 ENCOUNTER — Other Ambulatory Visit: Payer: Self-pay | Admitting: Sports Medicine

## 2023-08-30 LAB — PTH, INTACT (ICMA) AND IONIZED CALCIUM
Calcium, Ion: 5.7 mg/dL — ABNORMAL HIGH (ref 4.7–5.5)
Calcium: 11.6 mg/dL — ABNORMAL HIGH (ref 8.6–10.3)
PTH: 145 pg/mL — ABNORMAL HIGH (ref 16–77)

## 2023-08-30 LAB — CBC WITH DIFFERENTIAL/PLATELET
Absolute Lymphocytes: 1513 {cells}/uL (ref 850–3900)
Absolute Monocytes: 1067 {cells}/uL — ABNORMAL HIGH (ref 200–950)
Basophils Absolute: 39 {cells}/uL (ref 0–200)
Basophils Relative: 0.4 %
Eosinophils Absolute: 252 {cells}/uL (ref 15–500)
Eosinophils Relative: 2.6 %
HCT: 49.5 % (ref 38.5–50.0)
Hemoglobin: 15.9 g/dL (ref 13.2–17.1)
MCH: 28.6 pg (ref 27.0–33.0)
MCHC: 32.1 g/dL (ref 32.0–36.0)
MCV: 89.2 fL (ref 80.0–100.0)
MPV: 11.8 fL (ref 7.5–12.5)
Monocytes Relative: 11 %
Neutro Abs: 6829 {cells}/uL (ref 1500–7800)
Neutrophils Relative %: 70.4 %
Platelets: 195 10*3/uL (ref 140–400)
RBC: 5.55 10*6/uL (ref 4.20–5.80)
RDW: 16 % — ABNORMAL HIGH (ref 11.0–15.0)
Total Lymphocyte: 15.6 %
WBC: 9.7 10*3/uL (ref 3.8–10.8)

## 2023-08-30 LAB — VITAMIN D 25 HYDROXY (VIT D DEFICIENCY, FRACTURES): Vit D, 25-Hydroxy: 48 ng/mL (ref 30–100)

## 2023-08-30 LAB — BASIC METABOLIC PANEL WITH GFR
BUN/Creatinine Ratio: 14 (calc) (ref 6–22)
BUN: 29 mg/dL — ABNORMAL HIGH (ref 7–25)
CO2: 31 mmol/L (ref 20–32)
Calcium: 11.6 mg/dL — ABNORMAL HIGH (ref 8.6–10.3)
Chloride: 98 mmol/L (ref 98–110)
Creat: 2.1 mg/dL — ABNORMAL HIGH (ref 0.70–1.28)
Glucose, Bld: 108 mg/dL — ABNORMAL HIGH (ref 65–99)
Potassium: 4 mmol/L (ref 3.5–5.3)
Sodium: 142 mmol/L (ref 135–146)
eGFR: 32 mL/min/{1.73_m2} — ABNORMAL LOW (ref >=60)

## 2023-08-30 LAB — TSH: TSH: 1.96 m[IU]/L (ref 0.40–4.50)

## 2023-08-30 LAB — SODIUM, URINE, RANDOM: Sodium, Ur: 102 mmol/L (ref 28–272)

## 2023-08-30 LAB — CORTISOL: Cortisol, Plasma: 13.2 ug/dL

## 2023-09-10 DIAGNOSIS — G4733 Obstructive sleep apnea (adult) (pediatric): Secondary | ICD-10-CM | POA: Diagnosis not present

## 2023-09-10 DIAGNOSIS — J449 Chronic obstructive pulmonary disease, unspecified: Secondary | ICD-10-CM | POA: Diagnosis not present

## 2023-09-14 DIAGNOSIS — J449 Chronic obstructive pulmonary disease, unspecified: Secondary | ICD-10-CM | POA: Diagnosis not present

## 2023-09-14 DIAGNOSIS — G4733 Obstructive sleep apnea (adult) (pediatric): Secondary | ICD-10-CM | POA: Diagnosis not present

## 2023-09-18 ENCOUNTER — Ambulatory Visit (INDEPENDENT_AMBULATORY_CARE_PROVIDER_SITE_OTHER): Payer: Medicare Other | Admitting: Adult Health

## 2023-09-18 ENCOUNTER — Encounter: Payer: Self-pay | Admitting: Adult Health

## 2023-09-18 VITALS — BP 128/88 | HR 63 | Temp 96.6°F | Resp 18 | Ht 65.0 in | Wt 225.0 lb

## 2023-09-18 DIAGNOSIS — R7303 Prediabetes: Secondary | ICD-10-CM | POA: Diagnosis not present

## 2023-09-18 DIAGNOSIS — B379 Candidiasis, unspecified: Secondary | ICD-10-CM | POA: Diagnosis not present

## 2023-09-18 DIAGNOSIS — I5032 Chronic diastolic (congestive) heart failure: Secondary | ICD-10-CM

## 2023-09-18 DIAGNOSIS — I25119 Atherosclerotic heart disease of native coronary artery with unspecified angina pectoris: Secondary | ICD-10-CM

## 2023-09-18 DIAGNOSIS — I480 Paroxysmal atrial fibrillation: Secondary | ICD-10-CM

## 2023-09-18 DIAGNOSIS — G4733 Obstructive sleep apnea (adult) (pediatric): Secondary | ICD-10-CM | POA: Diagnosis not present

## 2023-09-18 DIAGNOSIS — F322 Major depressive disorder, single episode, severe without psychotic features: Secondary | ICD-10-CM

## 2023-09-18 MED ORDER — NYSTATIN 100000 UNIT/GM EX OINT: 1.0000 | TOPICAL_OINTMENT | Freq: Two times a day (BID) | CUTANEOUS | 0 refills | Status: DC

## 2023-09-18 NOTE — Patient Instructions (Signed)
Skin Yeast Infection  A skin yeast infection is a condition in which there is an overgrowth of yeast (Candida) that normally lives on the skin. This condition usually occurs in areas of the skin that are constantly warm and moist, such as the skin under the breasts or armpits, or in the groin and other body folds. What are the causes? This condition is caused by a change in the normal balance of the yeast that live on the skin. What increases the risk? You are more likely to develop this condition if you: Are obese. Are pregnant. Are 44 years of age or older. Wear tight clothing. Have any of the following conditions: Diabetes. Malnutrition. A weak body defense system (immune system). Take medicines such as: Birth control pills. Antibiotics. Steroid medicines. What are the signs or symptoms? The most common symptom of this condition is itchiness in the affected area. Other symptoms include: A red, swollen area of the skin. Bumps on the skin. How is this diagnosed? This condition is diagnosed with a medical history and physical exam. Your health care provider may check for yeast by taking scrapings of the skin to be viewed under a microscope. How is this treated? This condition is treated with medicine. Medicines may be prescribed or available over the counter. The medicines may be: Taken by mouth (orally). Applied as a cream or powder to your skin. Follow these instructions at home:  Take or apply over-the-counter and prescription medicines only as told by your health care provider. Maintain a healthy weight. If you need help losing weight, talk with your health care provider. Keep your skin clean and dry. Wear loose-fitting clothing. If you have diabetes, keep your blood sugar under control. Keep all follow-up visits. This is important. Contact a health care provider if: Your symptoms go away and then come back. Your symptoms do not get better with treatment. Your symptoms get  worse. Your rash spreads. You have a fever or chills. You have new symptoms. You have new warmth or redness of your skin. Your rash is painful or bleeding. Summary A skin yeast infection is a condition in which there is an overgrowth of yeast (Candida) that normally lives on the skin. Take or apply over-the-counter and prescription medicines only as told by your health care provider. Keep your skin clean and dry. Contact a health care provider if your symptoms do not get better with treatment. This information is not intended to replace advice given to you by your health care provider. Make sure you discuss any questions you have with your health care provider. Document Revised: 10/06/2020 Document Reviewed: 10/06/2020 Elsevier Patient Education  2024 [employer].

## 2023-09-18 NOTE — Progress Notes (Signed)
[facility] clinic  Provider:  Kenard Gower DNP  Code Status:  Full Code  Goals of Care:     06/14/2023    9:15 PM  Advanced Directives  Does Patient Have a Medical Advance Directive? Yes  Type of Estate agent of [place];Living will  Does patient want to make changes to medical advance directive? No - Patient declined  Copy of Healthcare Power of Attorney in Chart? No - copy requested     Chief Complaint  Patient presents with   Acute Visit    rash in crotch area   Discussed the use of AI scribe software for clinical note transcription with the patient, who gave verbal consent to proceed.  HPI: Patient is a 78 y.o. male seen today for an acute visit for rash in crotch area.   He has been experiencing erythematous rashes in the groin and under the right breast for the past three weeks. The rashes are itchy, burning, and have an unpleasant odor. He has been applying a steroid ointment, which initially provided relief, but the rashes have recurred. He is concerned about the rashes worsening and is afraid to wash the affected areas due to potential irritation.  He has a history of coronary artery disease and congestive heart failure. He experiences shortness of breath and occasional chest pain, for which he takes isosorbide mononitrate 30 mg daily and nitroglycerin as needed. He used two doses of nitroglycerin for chest pain relief yesterday. He is scheduled for a follow-up with his cardiologist and an echocardiogram in April.  He is on Remeron 30 mg at bedtime and venlafaxine for depression. He reports a good appetite but felt 'terrible' yesterday. No plans of self-harm.  He is prediabetic with a recent A1c check in October. He has an allergy to metformin, which previously caused leg discomfort. He follows a diet rich in fresh vegetables and fruits, avoiding canned vegetables.  He takes Lasix 40 mg every other day for congestive heart failure and reports  good kidney function despite chronic kidney disease stage 3B with a GFR of 32.   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[place], [place]    Past Surgical History:  Procedure Laterality Date   BACK SURGERY  1975   Lower Back Surgery, Dr.Seltzer   BACK SURGERY  1985   Dr.Seltzer   LEFT HEART CATH AND CORS/GRAFTS ANGIOGRAPHY N/A 11/17/2022   Procedure: LEFT HEART CATH AND CORS/GRAFTS ANGIOGRAPHY;  Surgeon: Corky Crafts, MD;  Location: [facility];  Service: Cardiovascular;  Laterality: N/A;   SHOULDER SURGERY Left 1965   Dr.Seltzer   TONSILLECTOMY  1950   Dr.Perellie    Allergies  Allergen Reactions   Acacia Itching   Metformin And Related Other (See Comments)    Metformin caused the patient's legs to become weak    Outpatient Encounter Medications as of 09/18/2023  Medication Sig   albuterol (VENTOLIN HFA) 108 (90 Base) MCG/ACT inhaler Inhale 1 puff into the lungs every 6 (six) hours as needed for wheezing or shortness of breath.   amiodarone (PACERONE) 200 MG tablet TAKE 1 TABLET BY MOUTH DAILY   carvedilol (COREG) 6.25 MG tablet TAKE 1 TABLET BY MOUTH TWICE  DAILY   cyanocobalamin (VITAMIN B12) 1000 MCG tablet Take 1 tablet (1,000 mcg total) by mouth daily.   ELIQUIS 5  MG TABS tablet TAKE 1 TABLET BY MOUTH TWICE  DAILY   famotidine (PEPCID) 40 MG tablet Take 40 mg by mouth daily.   fluticasone (FLONASE) 50 MCG/ACT nasal spray Place 2 sprays into both nostrils 3 (three) times daily as needed for allergies or rhinitis.   furosemide (LASIX) 40 MG tablet TAKE 1 TABLET BY MOUTH EVERY  OTHER DAY   glucose blood test strip 1 each by Other route daily. [employer] Glucometer   isosorbide mononitrate (IMDUR) 30 MG 24 hr tablet TAKE 1 TABLET BY MOUTH DAILY   loratadine (CLARITIN) 10 MG tablet Take 1 tablet (10 mg total) by mouth daily.   mirtazapine  (REMERON SOL-TAB) 30 MG disintegrating tablet Take 1 tablet (30 mg total) by mouth at bedtime.   montelukast (SINGULAIR) 10 MG tablet Take 10 mg by mouth daily as needed (for allergies).   nitroGLYCERIN (NITROSTAT) 0.4 MG SL tablet DISSOLVE 1 TABLET UNDER THE  TONGUE EVERY 5 MINUTES AS NEEDED FOR CHEST PAIN. MAX OF 3 TABLETS IN 15 MINUTES. CALL 911 IF PAIN  PERSISTS.   pantoprazole (PROTONIX) 40 MG tablet TAKE 1 TABLET BY MOUTH DAILY  BEFORE BREAKFAST   ranolazine (RANEXA) 500 MG 12 hr tablet Take 1 tablet (500 mg total) by mouth 2 (two) times daily.   rosuvastatin (CRESTOR) 40 MG tablet TAKE 1 TABLET BY MOUTH DAILY   sodium chloride (OCEAN) 0.65 % SOLN nasal spray Place 1 spray into both nostrils as needed for congestion.   TYLENOL 8 HOUR ARTHRITIS PAIN 650 MG CR tablet Take 650-1,300 mg by mouth every 8 (eight) hours as needed for pain.   venlafaxine XR (EFFEXOR-XR) 150 MG 24 hr capsule TAKE 1 CAPSULE BY MOUTH DAILY  WITH BREAKFAST   No facility-administered encounter medications on file as of 09/18/2023.    Review of Systems:  Review of Systems  Constitutional:  Negative for activity change, appetite change and fever.  HENT:  Negative for sore throat.   Eyes: Negative.   Cardiovascular:  Negative for chest pain and leg swelling.  Gastrointestinal:  Negative for abdominal distention, diarrhea and vomiting.  Genitourinary:  Negative for dysuria, frequency and urgency.  Skin:  Negative for color change.  Neurological:  Negative for dizziness and headaches.  Psychiatric/Behavioral:  Negative for behavioral problems and sleep disturbance. The patient is not nervous/anxious.     Health Maintenance  Topic Date Due   COVID-19 Vaccine (1) Never done   Hepatitis C Screening  Never done   OPHTHALMOLOGY EXAM  09/01/2022   Zoster Vaccines- Shingrix (1 of 2) 11/07/2023 (Originally 05/16/1965)   HEMOGLOBIN A1C  11/29/2023   Medicare Annual Wellness (AWV)  12/01/2023   Diabetic kidney evaluation  - Urine ACR  05/23/2024   Diabetic kidney evaluation - eGFR measurement  08/28/2024   DTaP/Tdap/Td (2 - Td or Tdap) 11/03/2029   Pneumonia Vaccine 13+ Years old  Completed   INFLUENZA VACCINE  Completed   HPV VACCINES  Aged Out   FOOT EXAM  Discontinued    Physical Exam: There were no vitals filed for this visit. There is no height or weight on file to calculate BMI. Physical Exam Constitutional:      Appearance: Normal appearance.  HENT:     Head: Normocephalic and atraumatic.     Mouth/Throat:     Mouth: Mucous membranes are moist.  Eyes:     Conjunctiva/sclera: Conjunctivae normal.  Cardiovascular:     Rate and Rhythm: Normal rate and regular rhythm.  Pulses: Normal pulses.     Heart sounds: Murmur heard.  Pulmonary:     Effort: Pulmonary effort is normal.     Breath sounds: Normal breath sounds.  Abdominal:     General: Bowel sounds are normal.     Palpations: Abdomen is soft.     Comments: Has mid abdominal hernia  Musculoskeletal:        General: No swelling. Normal range of motion.     Cervical back: Normal range of motion.  Skin:    General: Skin is warm and dry.     Findings: Rash present.     Comments: Erythematous rashes under right breast and bilateral groin  Neurological:     General: No focal deficit present.     Mental Status: He is alert and oriented to person, place, and time.  Psychiatric:        Mood and Affect: Mood normal.        Behavior: Behavior normal.        Thought Content: Thought content normal.        Judgment: Judgment normal.     Labs reviewed: Basic Metabolic Panel: Recent Labs    10/10/22 1510 10/10/22 1510 11/02/22 1037 11/10/22 1244 11/15/22 1816 11/16/22 1435 02/14/23 1500 04/19/23 1443 05/31/23 1446 07/03/23 1421 08/29/23 0857  NA 141  --   --  142 137   < > 141   < > 141 143 142  K 4.1  --   --  5.3* 4.0   < > 5.4*   < > 4.4 4.6 4.0  CL 102  --   --  101 99   < > 99   < > 104 100 98  CO2 28  --   --  27 26    < > 26   < > 29 36* 31  GLUCOSE 77  --   --  85 86   < > 90   < > 147* 64* 108*  BUN 25  --   --  17 27*   < > 27   < > 19 27* 29*  CREATININE 1.90*   < >  --  1.76* 2.02*   < > 2.05*   < > 1.67* 2.00* 2.10*  CALCIUM 10.9*  --   --  10.8* 10.4*   < > 10.7*   < > 10.5* 11.2* 11.6*  11.6*  MG  --   --   --  1.8 1.9  --  1.9  --   --   --   --   TSH 1.27  --  1.900  --   --   --   --   --   --   --  1.96   < > = values in this interval not displayed.   Liver Function Tests: Recent Labs    10/10/22 1510 11/15/22 1816 05/24/23 0000  AST 24 29  --   ALT 25 30  --   ALKPHOS  --  65  --   BILITOT 0.5 0.7  --   PROT 7.3 7.1  --   ALBUMIN  --  3.8 4.2   Recent Labs    11/15/22 1816  LIPASE 37   No results for input(s): "AMMONIA" in the last 8760 hours. CBC: Recent Labs    05/31/23 1446 07/03/23 1421 08/29/23 0857  WBC 7.8 7.9 9.7  NEUTROABS 5,819 5,127 6,829  HGB 14.7 15.3 15.9  HCT 46.1 48.0 49.5  MCV  89.7 88.6 89.2  PLT 185 199 195   Lipid Panel: Recent Labs    10/10/22 1510  CHOL 158  HDL 70  LDLCALC 64  TRIG 154*  CHOLHDL 2.3   Lab Results  Component Value Date   HGBA1C 6.0 (H) 05/31/2023    Procedures since last visit: No results found.  Assessment/Plan  1. Yeast infection -  Erythematous rashes under right breast and bilateral inguinal area for 3 weeks. Itching, burning, and odor present. Previously treated with steroid ointment with temporary relief. -Prescribe Nystatin ointment. Apply twice daily for 2 weeks. -Advise to keep area dry and clean. - nystatin ointment (MYCOSTATIN); Apply 1 Application topically 2 (two) times daily for 14 days.  Dispense: 28 g; Refill: 0  2. Prediabetes Lab Results  Component Value Date   HGBA1C 6.0 (H) 05/31/2023   -  -Check A1C today. -Advise to watch diet, particularly carbohydrate intake. - Hemoglobin A1C  3. Paroxysmal atrial fibrillation (HCC) (Primary) - rate-controlled -  continue Eliquis  5 mg BID for  anticoagulation and Amiodarone 200 mg daily and carvedilol 6.25 mg BID for rate control  4. Chronic diastolic HF (heart failure) (HCC) -  No peripheral edema. Reports shortness of breath and phlegm. -Continue current management of Lasix 40 mg Q other day -  follows up with cardiology  5. Current severe episode of major depressive disorder without psychotic features without prior episode (HCC) -  Reports feeling terrible despite taking Venlafaxine and Remeron. Good appetite noted. -Continue Venlafaxine and Remeron.  6. Coronary artery disease involving native heart with angina pectoris, unspecified vessel or lesion type (HCC) -  Reported chest pain relieved with 2 doses of nitroglycerin. -Continue Isosorbide Mononitrate 30mg  daily, Ranexa 500 mg 12 hour tablet BID and Nitroglycerin as needed.  7. OSA (obstructive sleep apnea) -  Awaiting CPAP machine. -Advise to follow up with [facility] and upcoming pulmonary appointment on September 26, 2023.    Labs/tests ordered:   A1C   Lareta Bruneau Medina-Vargas, NP

## 2023-09-19 ENCOUNTER — Encounter: Payer: Medicare Other | Admitting: Psychology

## 2023-09-19 LAB — HEMOGLOBIN A1C
Hgb A1c MFr Bld: 6.4 %{Hb} — ABNORMAL HIGH (ref <5.7)
Mean Plasma Glucose: 137 mg/dL
eAG (mmol/L): 7.6 mmol/L

## 2023-09-19 NOTE — Progress Notes (Signed)
A1C 6.4, up from 6.0, will need to exercise at least 150 minutes/week, low carb diet

## 2023-09-20 DIAGNOSIS — U071 COVID-19: Secondary | ICD-10-CM | POA: Diagnosis not present

## 2023-09-23 DIAGNOSIS — U071 COVID-19: Secondary | ICD-10-CM | POA: Diagnosis not present

## 2023-09-26 ENCOUNTER — Telehealth: Payer: Self-pay

## 2023-09-26 ENCOUNTER — Ambulatory Visit: Payer: Medicare Other | Admitting: Primary Care

## 2023-09-26 ENCOUNTER — Encounter: Payer: Self-pay | Admitting: Primary Care

## 2023-09-26 VITALS — BP 126/74 | HR 81 | Temp 98.5°F | Ht 66.5 in | Wt 220.8 lb

## 2023-09-26 DIAGNOSIS — G4733 Obstructive sleep apnea (adult) (pediatric): Secondary | ICD-10-CM

## 2023-09-26 NOTE — Telephone Encounter (Signed)
 I tried calling [facility] in regards to this pt because our office is not tagged in [facility] and we were unable to print a download. I was unable to reach anyone, so I left a vm asking for a return call back.

## 2023-09-26 NOTE — Telephone Encounter (Signed)
 BiPAP therapy on IPAP 24/ EPAP 20 cm H2O with a Large size [employer] Full Face Evora Full mask and heated humidification   Pt was suppose to be using a BiPAP to treat his OSA. Pt stated to me today he has not been using anything to treat his osa because he switched DME company's. He was using [facility], and stated he is now using [facility].   PCC's,  Are you all able to just switch the order and send it to [facility], or would a new order have to be sent to them? Let me and/or Buelah Manis, NP know.

## 2023-09-26 NOTE — Progress Notes (Signed)
 @Patient  ID: Johnny [name], male    DOB: [birth year], 78 y.o.   MRN: [id number]  No chief complaint on file.   Referring provider: Ngetich, Donalee Citrin, NP  HPI: 78 year old male, former smoker.  Past medical history significant for heart failure, coronary artery disease, hypertension, NSTEMI, A-fib, aortic stenosis, chronic respiratory failure with hypoxia, OSA, chronic kidney disease, hyperlipidemia.  05/30/2023- Interim hx  Patient presents today for sleep consult. Patient was referred by cardiology.  Patient has symptoms of loud snoring and excessive daytime sleepiness. He was noted to have severe obstructive sleep apnea on sleep study from 12/2021, AHI 60.5/hr. Ordered for BiPAP titration study but never completed. Reviewed sleep study results today. He is agreeing to have BIPAP titration study done and re-starting PAP therapy. He wears oxygen at night. Not currently on CPAP.   He would like a letter stating that due to his sleep apnea he was not getting enough sleep causing daytime sleepiness and that he can return to work at [employer] once he is back using BIPAP regularly.   Sleep questionnaire Symptoms-   loud snoring, restless sleep  Prior sleep study-he said 3 previous sleep studies Bedtime-11 PM Time to fall asleep-at times he is not able to fall asleep Nocturnal awakenings-3-5 times Out of bed/start of day-2 AM/ sometimes noon  Weight changes-10 pounds Do you operate heavy machinery-no Do you currently wear CPAP-no Do you current wear oxygen-at night Epworth-23  Social Hx: Patient is widowed. No children. Retired. Drinks wine at dinner. He quit smoking in 1985.  OSA (obstructive sleep apnea) - Patient has symptoms of loud snoring and excessive daytime sleepiness.  Epworth score 23.  Referred by cardiology.  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  Reviewed sleep study results and risks of untreated sleep apnea.  Patient needs BiPAP titration study  and in agreement with plan. Once he is back on PAP therapy and compliant with use we can provide him a letter for work.    Recommendations: - Once you get BIPAP aim to wear nightly 4-6 hours  - Focus on side sleeping position or elevate head with wedge pillow 30 degrees - Work on weight loss efforts if able  - Do not drive if experiencing excessive daytime sleepiness of fatigue    Orders: - BIPAP titration study    Follow-up: - 3 months with Beth NP for PAP compliance    09/26/2023- Interim hx  Patient presents today for 3 month follow-up/OSA. 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  Patient has symptoms of loud snoring and excessive daytime sleepiness.  Epworth score 23.  Patient underwent BIPAP titration study in November, optimal pressure selected for patient 24/20 cm H2O.  Patient had severe oxygen desaturations.  Minimal O2 at optimal pressure was 88%. BiPAP therapy on IPAP 24/ EPAP 20 cm H2O with a Large size [employer] Full Face Evora Full mask and heated humidification.   He tells me that he BIPAP order was sent to the wrong DME company, he had previous billing issues with [facility] and he would like order be sent to [facility]. He is using 2-3L oxygen at night. Oxygen supplies by [facility].      Allergies  Allergen Reactions   Acacia Itching   Metformin And Related Other (See Comments)    Metformin caused the patient's legs to become weak    Immunization History  Administered Date(s) Administered   Fluad Quad(high Dose 65+) 05/06/2020, 06/22/2022   Fluad Trivalent(High Dose 65+)  04/19/2023   Influenza Split 09/15/2009, 05/03/2013   Influenza, High Dose Seasonal PF 06/05/2014, 04/20/2015, 06/08/2016, 04/22/2017, 04/15/2021   Influenza-Unspecified 07/14/2004, 05/07/2007, 05/02/2008, 05/01/2018, 04/29/2019   PPD Test 06/08/2021   Pneumococcal Conjugate-13 06/05/2014   Pneumococcal Polysaccharide-23 08/13/2012   Tdap 11/04/2019    Past Medical History:   Diagnosis Date   Atrial fibrillation (HCC)    Deafness in left ear    Hypertension    Symptomatic bradycardia 04/2021   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[place], [place]    Tobacco History: Social History   Tobacco Use  Smoking Status Former   Current packs/day: 1.00   Average packs/day: 1 pack/day for 10.0 years (10.0 ttl pk-yrs)   Types: Cigarettes  Smokeless Tobacco Never  Tobacco Comments   Quit at age 35   Counseling given: Not Answered Tobacco comments: Quit at age 72   Outpatient Medications Prior to Visit  Medication Sig Dispense Refill   albuterol (VENTOLIN HFA) 108 (90 Base) MCG/ACT inhaler Inhale 1 puff into the lungs every 6 (six) hours as needed for wheezing or shortness of breath.     amiodarone (PACERONE) 200 MG tablet TAKE 1 TABLET BY MOUTH DAILY 100 tablet 2   carvedilol (COREG) 6.25 MG tablet TAKE 1 TABLET BY MOUTH TWICE  DAILY 200 tablet 2   cyanocobalamin (VITAMIN B12) 1000 MCG tablet Take 1 tablet (1,000 mcg total) by mouth daily. 90 tablet 1   ELIQUIS 5 MG TABS tablet TAKE 1 TABLET BY MOUTH TWICE  DAILY 200 tablet 2   famotidine (PEPCID) 40 MG tablet Take 40 mg by mouth daily.     fluticasone (FLONASE) 50 MCG/ACT nasal spray Place 2 sprays into both nostrils 3 (three) times daily as needed for allergies or rhinitis.     furosemide (LASIX) 40 MG tablet TAKE 1 TABLET BY MOUTH EVERY  OTHER DAY 50 tablet 2   glucose blood test strip 1 each by Other route daily. [facility] Glucometer 100 each 12   isosorbide mononitrate (IMDUR) 30 MG 24 hr tablet TAKE 1 TABLET BY MOUTH DAILY 100 tablet 2   loratadine (CLARITIN) 10 MG tablet Take 1 tablet (10 mg total) by mouth daily. 30 tablet 11   mirtazapine (REMERON SOL-TAB) 30 MG disintegrating tablet Take 1 tablet (30 mg total) by mouth at bedtime. 90 tablet 3   montelukast (SINGULAIR) 10 MG tablet Take 10 mg by mouth daily as needed (for allergies).     nitroGLYCERIN (NITROSTAT) 0.4 MG SL tablet DISSOLVE 1 TABLET UNDER THE  TONGUE EVERY 5  MINUTES AS NEEDED FOR CHEST PAIN. MAX OF 3 TABLETS IN 15 MINUTES. CALL 911 IF PAIN  PERSISTS. 75 tablet 4   nystatin ointment (MYCOSTATIN) Apply 1 Application topically 2 (two) times daily for 14 days. 28 g 0   pantoprazole (PROTONIX) 40 MG tablet TAKE 1 TABLET BY MOUTH DAILY  BEFORE BREAKFAST 100 tablet 2   ranolazine (RANEXA) 500 MG 12 hr tablet Take 1 tablet (500 mg total) by mouth 2 (two) times daily. 180 tablet 3   rosuvastatin (CRESTOR) 40 MG tablet TAKE 1 TABLET BY MOUTH DAILY 100 tablet 2   sodium chloride (OCEAN) 0.65 % SOLN nasal spray Place 1 spray into both nostrils as needed for congestion. 88 mL 3   TYLENOL 8 HOUR ARTHRITIS PAIN 650 MG CR tablet Take 650-1,300 mg by mouth every 8 (eight) hours as needed for pain.     venlafaxine XR (EFFEXOR-XR) 150 MG 24 hr capsule TAKE 1 CAPSULE BY MOUTH  DAILY  WITH BREAKFAST 90 capsule 3   No facility-administered medications prior to visit.   Review of Systems  Review of Systems  Constitutional:  Positive for fatigue.  HENT: Negative.    Respiratory: Negative.     Physical Exam  There were no vitals taken for this visit. Physical Exam Constitutional:      General: He is not in acute distress.    Appearance: Normal appearance. He is not ill-appearing.  HENT:     Head: Normocephalic and atraumatic.     Mouth/Throat:     Mouth: Mucous membranes are moist.     Pharynx: Oropharynx is clear.  Cardiovascular:     Rate and Rhythm: Normal rate and regular rhythm.  Pulmonary:     Effort: Pulmonary effort is normal.     Breath sounds: Normal breath sounds.  Musculoskeletal:        General: Normal range of motion.  Skin:    General: Skin is warm and dry.  Neurological:     General: No focal deficit present.     Mental Status: He is alert and oriented to person, place, and time.  Psychiatric:        Mood and Affect: Mood normal.        Behavior: Behavior normal.        Thought Content: Thought content normal.        Judgment:  Judgment normal.      Lab Results:  CBC    Component Value Date/Time   WBC 9.7 08/29/2023 0857   RBC 5.55 08/29/2023 0857   HGB 15.9 08/29/2023 0857   HGB 15.2 12/29/2021 0944   HCT 49.5 08/29/2023 0857   HCT 44.7 12/29/2021 0944   PLT 195 08/29/2023 0857   PLT 232 12/29/2021 0944   MCV 89.2 08/29/2023 0857   MCV 82 12/29/2021 0944   MCH 28.6 08/29/2023 0857   MCHC 32.1 08/29/2023 0857   RDW 16.0 (H) 08/29/2023 0857   RDW 18.5 (H) 12/29/2021 0944   LYMPHSABS 1.5 11/15/2022 1816   MONOABS 0.9 11/15/2022 1816   EOSABS 252 08/29/2023 0857   BASOSABS 39 08/29/2023 0857    BMET    Component Value Date/Time   NA 142 08/29/2023 0857   NA 140 05/24/2023 0000   K 4.0 08/29/2023 0857   CL 98 08/29/2023 0857   CO2 31 08/29/2023 0857   GLUCOSE 108 (H) 08/29/2023 0857   BUN 29 (H) 08/29/2023 0857   BUN 20 05/24/2023 0000   CREATININE 2.10 (H) 08/29/2023 0857   CALCIUM 11.6 (H) 08/29/2023 0857   CALCIUM 11.6 (H) 08/29/2023 0857   GFRNONAA 43 (L) 11/18/2022 0715    BNP    Component Value Date/Time   BNP 75.9 11/15/2022 1800    ProBNP No results found for: "PROBNP"  Imaging: No results found.   Assessment & Plan:   1. OSA (obstructive sleep apnea) (Primary) - Pulse oximetry, overnight; Future     Obstructive Sleep Apnea Patient reports ongoing symptoms of snoring and fatigue. Previous titration study showed oxygen desaturations as low as 77% with optimal pressure of 24/20. Patient currently using 2-3 liters of oxygen at night. -Order BiPAP machine from [facility] with pressure settings of 24/20. -Advise patient to use 2 liters of oxygen with BiPAP machine. -Order overnight oximetry test to assess oxygen levels while using BiPAP and oxygen. -Schedule follow-up in 8-12 weeks for compliance check.  Billing Issues Patient reports previous billing issues with [facility] and a compromised bank  card. -Ensure BiPAP order is sent to [facility], with whom the patient is  already established. -Advise patient to confirm receipt of order with [facility] in a day or two.      Glenford Bayley, NP 09/26/2023

## 2023-09-26 NOTE — Patient Instructions (Addendum)
-  OBSTRUCTIVE SLEEP APNEA: Obstructive Sleep Apnea is a condition where your airway becomes blocked during sleep, causing breathing pauses and drops in oxygen levels. We will order a BiPAP machine from [facility] with pressure settings of 24/20 cm H2O and advise you to use 2 liters of oxygen with the BiPAP machine. Additionally, we will conduct an overnight oximetry test to monitor your oxygen levels while using the BiPAP and oxygen. Please follow up in 8-12 weeks for a compliance check.  -BILLING ISSUES: We understand you have had previous billing issues with [facility] and a compromised bank card. We will ensure that the BiPAP order is sent to [facility], with whom you are already established. Please confirm receipt of the order with [facility] in a day or two.  Orders: Overnight oximetry test on BIPAP/2L oxygen   Follow-up Please follow up in 8-12 weeks for a compliance check. Confirm receipt of the BiPAP order with [facility] in a day or two.

## 2023-09-27 DIAGNOSIS — I480 Paroxysmal atrial fibrillation: Secondary | ICD-10-CM | POA: Diagnosis not present

## 2023-09-27 DIAGNOSIS — I129 Hypertensive chronic kidney disease with stage 1 through stage 4 chronic kidney disease, or unspecified chronic kidney disease: Secondary | ICD-10-CM | POA: Diagnosis not present

## 2023-09-27 DIAGNOSIS — N1832 Chronic kidney disease, stage 3b: Secondary | ICD-10-CM | POA: Diagnosis not present

## 2023-09-27 DIAGNOSIS — I5032 Chronic diastolic (congestive) heart failure: Secondary | ICD-10-CM | POA: Diagnosis not present

## 2023-09-28 ENCOUNTER — Other Ambulatory Visit (HOSPITAL_COMMUNITY): Payer: Self-pay | Admitting: Internal Medicine

## 2023-09-28 ENCOUNTER — Encounter: Payer: Self-pay | Admitting: Internal Medicine

## 2023-10-08 DIAGNOSIS — G4733 Obstructive sleep apnea (adult) (pediatric): Secondary | ICD-10-CM | POA: Diagnosis not present

## 2023-10-08 DIAGNOSIS — J449 Chronic obstructive pulmonary disease, unspecified: Secondary | ICD-10-CM | POA: Diagnosis not present

## 2023-10-09 ENCOUNTER — Ambulatory Visit: Payer: Medicare Other | Admitting: Adult Health

## 2023-10-09 ENCOUNTER — Encounter: Payer: Self-pay | Admitting: Psychology

## 2023-10-12 DIAGNOSIS — J449 Chronic obstructive pulmonary disease, unspecified: Secondary | ICD-10-CM | POA: Diagnosis not present

## 2023-10-12 DIAGNOSIS — G4733 Obstructive sleep apnea (adult) (pediatric): Secondary | ICD-10-CM | POA: Diagnosis not present

## 2023-10-18 ENCOUNTER — Encounter (HOSPITAL_COMMUNITY): Payer: Self-pay

## 2023-10-18 ENCOUNTER — Encounter (HOSPITAL_COMMUNITY)

## 2023-10-18 DIAGNOSIS — U071 COVID-19: Secondary | ICD-10-CM | POA: Diagnosis not present

## 2023-10-21 DIAGNOSIS — U071 COVID-19: Secondary | ICD-10-CM | POA: Diagnosis not present

## 2023-10-25 ENCOUNTER — Encounter (HOSPITAL_COMMUNITY)
Admission: RE | Admit: 2023-10-25 | Discharge: 2023-10-25 | Disposition: A | Discharge status: Home / Self Care | Source: Ambulatory Visit | Attending: Internal Medicine | Admitting: Internal Medicine

## 2023-10-25 MED ORDER — TECHNETIUM TC 99M SESTAMIBI - CARDIOLITE
25.0000 | Freq: Once | INTRAVENOUS | Status: AC | PRN
  Administered 2023-10-25: 25 via INTRAVENOUS

## 2023-11-02 ENCOUNTER — Encounter: Payer: Self-pay | Admitting: Neurology

## 2023-11-02 ENCOUNTER — Ambulatory Visit: Payer: Medicare Other | Admitting: Neurology

## 2023-11-02 ENCOUNTER — Telehealth: Payer: Self-pay

## 2023-11-02 VITALS — BP 128/74 | Ht 66.0 in | Wt 226.0 lb

## 2023-11-02 DIAGNOSIS — G4733 Obstructive sleep apnea (adult) (pediatric): Secondary | ICD-10-CM

## 2023-11-02 DIAGNOSIS — G3184 Mild cognitive impairment, so stated: Secondary | ICD-10-CM

## 2023-11-02 DIAGNOSIS — R296 Repeated falls: Secondary | ICD-10-CM | POA: Diagnosis not present

## 2023-11-02 NOTE — Progress Notes (Signed)
 [facility]  PATIENT: Johnny [name] DOB: 22-Dec-1945  REQUESTING CLINICIAN: Ngetich, Dinah C, NP HISTORY FROM: Patient  REASON FOR VISIT: Memory deficits    HISTORICAL  CHIEF COMPLAINT:  Chief Complaint  Patient presents with   Memory Loss    Rm13,  Memory loss: MOCA SCORE   Follow-up    Rm 13, alone, reports more trouble with short term memory     INTERVAL HISTORY 11/02/2023:  Patient presents today for follow-up, last visit was in April of last year, at that time we obtained ATN profile which was negative for Alzheimer disease biomarker.  His mild cognitive impairment might be secondary to other causes such as other medical conditions or vascular dementia.  He tells me that he is declining medically, he now has to use oxygen supplementation, he cannot walk long distance.  He has been having falls.  He has not been using his CPAP but tells me that he will follow-up with pulmonology. He tells me that his memory is stable, he still does struggle with short-term memory loss but remains independent with all activities of daily living.   HISTORY OF PRESENT ILLNESS:  This is a 78 year old gentleman past medical history including hypertension, hyperlipidemia, heart disease, atrial fibrillation on Eliquis, obesity, sleep apnea not on CPAP, anxiety/depression who is presenting for memory problem.  Patient reports memory problem has been going on for many years and getting worse.  He is very forgetful, misplacing items.  He does forget what people tells him.  He does not remember.  He reports a history of ADD and dyslexia which make his memory concerns worse.  He is alone here today.  He denies any family history of dementia.  He does drive, denies any recent accident but stated he can get loss going to familiar places.   TBI:   No past history of TBI Stroke:   no past history of stroke Seizures:   no past history of seizures Sleep: Yes, does not uses CPAP  Mood: Yes,  on Venlafaxine  Family history of Dementia: Denies  Functional status: independent in all ADLs and IADLs Patient lives with landlady . Cooking: sometimes Cleaning: yes Shopping: yes  Bathing: yes, no issues  Toileting: yes, no issues  Driving: Yes, sometimes get lost  Bills: late because of forgetfulness  Medications: Sometimes will forget to take it  Ever left the stove on by accident?: Yes  Forget how to use items around the house?: Remote control  Getting lost going to familiar places?: Yes  Forgetting loved ones names?: Denies  Word finding difficulty? Yes  Sleep: Good    OTHER MEDICAL CONDITIONS: Hypertension, Hyperlipidemia, Obesity, sleep apnea not on CPAP, heart disease, atrial fibrillation    REVIEW OF SYSTEMS: Full 14 system review of systems performed and negative with exception of: As noted in the HPI   ALLERGIES: Allergies  Allergen Reactions   Acacia Itching   Metformin And Related Other (See Comments)    Metformin caused the patient's legs to become weak    HOME MEDICATIONS: Outpatient Medications Prior to Visit  Medication Sig Dispense Refill   albuterol (VENTOLIN HFA) 108 (90 Base) MCG/ACT inhaler Inhale 1 puff into the lungs every 6 (six) hours as needed for wheezing or shortness of breath.     amiodarone (PACERONE) 200 MG tablet TAKE 1 TABLET BY MOUTH DAILY 100 tablet 2   carvedilol (COREG) 6.25 MG tablet TAKE 1 TABLET BY MOUTH TWICE  DAILY 200 tablet 2   cyanocobalamin (  VITAMIN B12) 1000 MCG tablet Take 1 tablet (1,000 mcg total) by mouth daily. 90 tablet 1   ELIQUIS 5 MG TABS tablet TAKE 1 TABLET BY MOUTH TWICE  DAILY 200 tablet 2   famotidine (PEPCID) 40 MG tablet Take 40 mg by mouth daily.     fluticasone (FLONASE) 50 MCG/ACT nasal spray Place 2 sprays into both nostrils 3 (three) times daily as needed for allergies or rhinitis.     furosemide (LASIX) 40 MG tablet TAKE 1 TABLET BY MOUTH EVERY  OTHER DAY 50 tablet 2   glucose blood test strip 1 each  by Other route daily. [facility] Glucometer 100 each 12   isosorbide mononitrate (IMDUR) 30 MG 24 hr tablet TAKE 1 TABLET BY MOUTH DAILY 100 tablet 2   loratadine (CLARITIN) 10 MG tablet Take 1 tablet (10 mg total) by mouth daily. 30 tablet 11   mirtazapine (REMERON SOL-TAB) 30 MG disintegrating tablet Take 1 tablet (30 mg total) by mouth at bedtime. 90 tablet 3   montelukast (SINGULAIR) 10 MG tablet Take 10 mg by mouth daily as needed (for allergies).     nitroGLYCERIN (NITROSTAT) 0.4 MG SL tablet DISSOLVE 1 TABLET UNDER THE  TONGUE EVERY 5 MINUTES AS NEEDED FOR CHEST PAIN. MAX OF 3 TABLETS IN 15 MINUTES. CALL 911 IF PAIN  PERSISTS. 75 tablet 4   pantoprazole (PROTONIX) 40 MG tablet TAKE 1 TABLET BY MOUTH DAILY  BEFORE BREAKFAST 100 tablet 2   ranolazine (RANEXA) 500 MG 12 hr tablet Take 1 tablet (500 mg total) by mouth 2 (two) times daily. 180 tablet 3   rosuvastatin (CRESTOR) 40 MG tablet TAKE 1 TABLET BY MOUTH DAILY 100 tablet 2   sodium chloride (OCEAN) 0.65 % SOLN nasal spray Place 1 spray into both nostrils as needed for congestion. 88 mL 3   TYLENOL 8 HOUR ARTHRITIS PAIN 650 MG CR tablet Take 650-1,300 mg by mouth every 8 (eight) hours as needed for pain.     venlafaxine XR (EFFEXOR-XR) 150 MG 24 hr capsule TAKE 1 CAPSULE BY MOUTH DAILY  WITH BREAKFAST 90 capsule 3   No facility-administered medications prior to visit.    PAST MEDICAL HISTORY: 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[place], [place]    PAST SURGICAL HISTORY: Past Surgical History:  Procedure Laterality Date   BACK SURGERY  1975   Lower Back Surgery, Dr.Seltzer   BACK SURGERY  1985   Dr.Seltzer   LEFT HEART CATH AND CORS/GRAFTS ANGIOGRAPHY N/A 11/17/2022   Procedure: LEFT HEART CATH AND CORS/GRAFTS ANGIOGRAPHY;  Surgeon: Corky Crafts, MD;  Location: [facility];  Service:  Cardiovascular;  Laterality: N/A;   SHOULDER SURGERY Left 1965   Dr.Seltzer   TONSILLECTOMY  1950   Dr.Perellie    FAMILY HISTORY: Family History  Problem Relation Age of Onset   Cancer Father    Stroke Sister    Stroke Brother        x 2    SOCIAL HISTORY: Social History   Socioeconomic History   Marital status: Widowed    Spouse name: Not on file   Number of children: Not on file   Years of education: Not on file   Highest education level: 12th grade  Occupational History   Not on file  Tobacco Use   Smoking status: Former  Current packs/day: 1.00    Average packs/day: 1 pack/day for 10.0 years (10.0 ttl pk-yrs)    Types: Cigarettes   Smokeless tobacco: Never   Tobacco comments:    Quit at age 89  Vaping Use   Vaping status: Never Used  Substance and Sexual Activity   Alcohol use: Yes    Alcohol/week: 1.0 standard drink of alcohol    Types: 1 Glasses of wine per week    Comment: occasion   Drug use: Never   Sexual activity: Not on file  Other Topics Concern   Not on file  Social History Narrative   Tobacco use, amount per day now: None   Past tobacco use, amount per day: 1 pack   How many years did you use tobacco: 10 years.   Alcohol use (drinks per week): None   Diet:   Do you drink/eat things with caffeine: Yes   Marital status:  Widow                                What year were you married? 1972   Do you live in a house, apartment, assisted living, condo, trailer, etc.? House   Is it one or more stories? One   How many persons live in your home? One   Do you have pets in your home?( please list) No   Highest Level of education completed? 12th grade.   Current or past profession: Psychologist, occupational, Investment banker, operational, Clinical biochemist.    Do you exercise?   No                               Type and how often?   Do you have a living will? Yes   Do you have a DNR form?     No                              If not, do you want to discuss one?   Do you have signed POA/HPOA  forms?  Yes                      If so, please bring to you appointment      Do you have any difficulty bathing or dressing yourself? No   Do you have any difficulty preparing food or eating? No   Do you have any difficulty managing your medications? No   Do you have any difficulty managing your finances? Yes   Do you have any difficulty affording your medications? No   Social Drivers of Corporate investment banker Strain: High Risk (07/02/2023)   Overall Financial Resource Strain (CARDIA)    Difficulty of Paying Living Expenses: Hard  Food Insecurity: Food Insecurity Present (07/02/2023)   Hunger Vital Sign    Worried About Running Out of Food in the Last Year: Sometimes true    Ran Out of Food in the Last Year: Sometimes true  Transportation Needs: Unmet Transportation Needs (07/02/2023)   PRAPARE - Administrator, Civil Service (Medical): Yes    Lack of Transportation (Non-Medical): No  Physical Activity: Unknown (07/02/2023)   Exercise Vital Sign    Days of Exercise per Week: 0 days    Minutes of Exercise per Session: Not on file  Stress: Stress Concern Present (07/02/2023)  [employer] of Occupational Health - Occupational Stress Questionnaire    Feeling of Stress : To some extent  Social Connections: Moderately Isolated (07/02/2023)   Social Connection and Isolation Panel [NHANES]    Frequency of Communication with Friends and Family: More than three times a week    Frequency of Social Gatherings with Friends and Family: Never    Attends Religious Services: More than 4 times per year    Active Member of [employer] or Organizations: No    Attends Banker Meetings: Not on file    Marital Status: Widowed  Intimate Partner Violence: Not At Risk (11/17/2022)   Humiliation, Afraid, Rape, and Kick questionnaire    Fear of Current or Ex-Partner: No    Emotionally Abused: No    Physically Abused: No    Sexually Abused: No    PHYSICAL EXAM  GENERAL  EXAM/CONSTITUTIONAL: Vitals:  Vitals:   11/02/23 1103  BP: 128/74  Weight: 226 lb (102.5 kg)  Height: 5\' 6"  (1.676 m)   Body mass index is 36.48 kg/m. Wt Readings from Last 3 Encounters:  11/02/23 226 lb (102.5 kg)  09/26/23 220 lb 12.8 oz (100.2 kg)  09/18/23 225 lb (102.1 kg)   Patient is in no distress; well developed, nourished and groomed; neck is supple  MUSCULOSKELETAL: Gait, strength, tone, movements noted in Neurologic exam below  NEUROLOGIC: MENTAL STATUS:      No data to display            11/02/2023   11:08 AM 11/02/2022   10:02 AM  Montreal Cognitive Assessment   Visuospatial/ Executive (0/5) 4 3  Naming (0/3) 3 3  Attention: Read list of digits (0/2) 0 2  Attention: Read list of letters (0/1) 0 1  Attention: Serial 7 subtraction starting at 100 (0/3) 2 3  Language: Repeat phrase (0/2) 1 0  Language : Fluency (0/1) 1 1  Abstraction (0/2) 2 2  Delayed Recall (0/5) 0 0  Orientation (0/6) 6 6  Total 19 21    CRANIAL NERVE:  2nd, 3rd, 4th, 6th- visual fields full to confrontation, extraocular muscles intact, no nystagmus 5th - facial sensation symmetric 7th - facial strength symmetric 8th - hearing intact 9th - palate elevates symmetrically, uvula midline 11th - shoulder shrug symmetric 12th - tongue protrusion midline  MOTOR:  normal bulk and tone, full strength in the BUE, BLE  SENSORY:  normal and symmetric to light touch  COORDINATION:  finger-nose-finger, fine finger movements normal  GAIT/STATION:  Antalgic gait, walks with a cane    DIAGNOSTIC DATA (LABS, IMAGING, TESTING) - I reviewed patient records, labs, notes, testing and imaging myself where available.  Lab Results  Component Value Date   WBC 9.7 08/29/2023   HGB 15.9 08/29/2023   HCT 49.5 08/29/2023   MCV 89.2 08/29/2023   PLT 195 08/29/2023      Component Value Date/Time   NA 142 08/29/2023 0857   NA 140 05/24/2023 0000   K 4.0 08/29/2023 0857   CL 98 08/29/2023  0857   CO2 31 08/29/2023 0857   GLUCOSE 108 (H) 08/29/2023 0857   BUN 29 (H) 08/29/2023 0857   BUN 20 05/24/2023 0000   CREATININE 2.10 (H) 08/29/2023 0857   CALCIUM 11.6 (H) 08/29/2023 0857   CALCIUM 11.6 (H) 08/29/2023 0857   PROT 7.1 11/15/2022 1816   ALBUMIN 4.2 05/24/2023 0000   AST 29 11/15/2022 1816   ALT 30 11/15/2022 1816   ALKPHOS 65 11/15/2022 1816  BILITOT 0.7 11/15/2022 1816   GFRNONAA 43 (L) 11/18/2022 0715   Lab Results  Component Value Date   CHOL 158 10/10/2022   HDL 70 10/10/2022   LDLCALC 64 10/10/2022   TRIG 154 (H) 10/10/2022   CHOLHDL 2.3 10/10/2022   Lab Results  Component Value Date   HGBA1C 6.4 (H) 09/18/2023   Lab Results  Component Value Date   VITAMINB12 290 11/02/2022   Lab Results  Component Value Date   TSH 1.96 08/29/2023    Head CT 09/13/2022 1. No acute intracranial abnormality. 2. Generalized cerebral atrophy with widening of the extra-axial spaces and ventricular dilatation.   ATN Profile not consistent with Alzheimer disease pathology  Routine EEG 11/10/2022: mild diffuse slowing    ASSESSMENT AND PLAN  78 y.o. year old male with hypertension, hyperlipidemia, heart disease, atrial fibrillation on Eliquis, obesity, sleep apnea not on CPAP, anxiety/depression who is presenting for follow up for cognitive impairment. Patient still lives independently, intact activity of daily living.  He is ATN profile was negative for Alzheimer disease biomarker.  Patient mild cognitive impairment likely secondary to vascular dementia or other medical conditions.  Plan for now is to refer patient to physical therapy due to his multiple falls, I will not start him on medication for memory loss at the moment.  He will continue to follow with PCP and return as needed. I have strongly advised him to follow-up with pulmonology to get his CPAP machine for sleep apnea.  He voiced understanding.   1. Mild cognitive impairment   2. Recurrent falls   3.  OSA (obstructive sleep apnea)     Patient Instructions  Continue current medications Referral to physical therapy for gait training due to multiple falls Please follow-up with pulmonology regarding your CPAP machine Continue to follow with PCP Return as needed.  Orders Placed This Encounter  Procedures   Ambulatory referral to Physical Therapy    No orders of the defined types were placed in this encounter.   Return if symptoms worsen or fail to improve.    Windell Norfolk, MD 11/02/2023, 12:28 PM  [facility] [address], [address] [place], [place] [address] [phone]

## 2023-11-02 NOTE — Patient Instructions (Signed)
 Continue current medications Referral to physical therapy for gait training due to multiple falls Please follow-up with pulmonology regarding your CPAP machine Continue to follow with PCP Return as needed.

## 2023-11-02 NOTE — Telephone Encounter (Signed)
 Referral sent to [facility] (P) [phone] (F) [phone]

## 2023-11-06 ENCOUNTER — Ambulatory Visit (HOSPITAL_COMMUNITY): Payer: Medicare Other | Attending: Cardiology

## 2023-11-06 DIAGNOSIS — I350 Nonrheumatic aortic (valve) stenosis: Secondary | ICD-10-CM | POA: Diagnosis not present

## 2023-11-06 LAB — ECHOCARDIOGRAM COMPLETE
AR max vel: 0.85 cm2
AV Area VTI: 0.94 cm2
AV Area mean vel: 0.81 cm2
AV Mean grad: 32 mmHg
AV Peak grad: 59.6 mmHg
Ao pk vel: 3.86 m/s
Area-P 1/2: 3.53 cm2
Calc EF: 57.3 %
S' Lateral: 3.2 cm
Single Plane A2C EF: 55 %
Single Plane A4C EF: 54.4 %

## 2023-11-07 ENCOUNTER — Encounter: Admitting: Psychology

## 2023-11-07 ENCOUNTER — Encounter: Payer: Self-pay | Admitting: *Deleted

## 2023-11-08 DIAGNOSIS — G4733 Obstructive sleep apnea (adult) (pediatric): Secondary | ICD-10-CM | POA: Diagnosis not present

## 2023-11-08 DIAGNOSIS — H02889 Meibomian gland dysfunction of unspecified eye, unspecified eyelid: Secondary | ICD-10-CM | POA: Diagnosis not present

## 2023-11-08 DIAGNOSIS — J449 Chronic obstructive pulmonary disease, unspecified: Secondary | ICD-10-CM | POA: Diagnosis not present

## 2023-11-08 DIAGNOSIS — H04129 Dry eye syndrome of unspecified lacrimal gland: Secondary | ICD-10-CM | POA: Diagnosis not present

## 2023-11-12 DIAGNOSIS — G4733 Obstructive sleep apnea (adult) (pediatric): Secondary | ICD-10-CM | POA: Diagnosis not present

## 2023-11-12 DIAGNOSIS — J449 Chronic obstructive pulmonary disease, unspecified: Secondary | ICD-10-CM | POA: Diagnosis not present

## 2023-11-14 ENCOUNTER — Telehealth: Payer: Self-pay

## 2023-11-14 ENCOUNTER — Other Ambulatory Visit: Payer: Self-pay | Admitting: Internal Medicine

## 2023-11-14 DIAGNOSIS — R7989 Other specified abnormal findings of blood chemistry: Secondary | ICD-10-CM

## 2023-11-14 NOTE — Telephone Encounter (Unsigned)
 Copied from CRM [id number]. Topic: Clinical - Prescription Issue >> Nov 13, 2023  3:21 PM Juliana Ocean wrote: Reason for CRM: pt wants to know who has the order for his ByPap machine?  Pt is trying to get this [phone]

## 2023-11-14 NOTE — Telephone Encounter (Signed)
 Copied from CRM [id number]. Topic: Clinical - Prescription Issue >> Nov 13, 2023  3:21 PM Juliana Ocean wrote: Reason for CRM: pt wants to know who has the order for his ByPap machine?  Pt is trying to get this [phone]  ATC X1. LMTCB. Order was sent to [facility]. Pt may need to call them

## 2023-11-15 NOTE — Telephone Encounter (Signed)
 I have refaxed the referral, notes, sleep study to [facility]

## 2023-11-15 NOTE — Telephone Encounter (Signed)
 ATC X2. LMTCB   Routing to [facility] to see if they are able to get with [employer] about this.

## 2023-11-17 DIAGNOSIS — J449 Chronic obstructive pulmonary disease, unspecified: Secondary | ICD-10-CM | POA: Diagnosis not present

## 2023-11-18 DIAGNOSIS — U071 COVID-19: Secondary | ICD-10-CM | POA: Diagnosis not present

## 2023-11-20 ENCOUNTER — Encounter: Admitting: Psychology

## 2023-11-21 ENCOUNTER — Ambulatory Visit

## 2023-11-21 DIAGNOSIS — U071 COVID-19: Secondary | ICD-10-CM | POA: Diagnosis not present

## 2023-11-21 NOTE — Telephone Encounter (Signed)
 I spoke with Dania Dupre with [employer] and she stated they would pull the order and give it to the Bipap team to process and they would be calling the patient

## 2023-11-22 ENCOUNTER — Ambulatory Visit
Admission: RE | Admit: 2023-11-22 | Discharge: 2023-11-22 | Disposition: A | Discharge status: Home / Self Care | Source: Ambulatory Visit | Attending: Internal Medicine | Admitting: Internal Medicine

## 2023-11-22 ENCOUNTER — Ambulatory Visit: Admitting: Psychology

## 2023-11-22 DIAGNOSIS — R7989 Other specified abnormal findings of blood chemistry: Secondary | ICD-10-CM

## 2023-11-22 DIAGNOSIS — E041 Nontoxic single thyroid nodule: Secondary | ICD-10-CM | POA: Diagnosis not present

## 2023-11-23 ENCOUNTER — Ambulatory Visit: Admitting: Psychology

## 2023-11-24 ENCOUNTER — Telehealth: Payer: Self-pay

## 2023-11-24 NOTE — Telephone Encounter (Signed)
 Pt is scheduled to see Irby Mannan, NP on Monday 11-27-23 for compliance. I called [employer] trying to get a download from them, and I was told that a RT would fax one over if they did have one. I gave [employer] the fax number to B pod.   The LOV also stated the pt was using 2-3 L o2 HS. Will await or a fax or call back if needed.

## 2023-11-26 NOTE — Progress Notes (Signed)
 Cardiology Office Note:    Date:  12/01/2023   ID:  Johnny [name], DOB Apr 05, 1946, MRN [id number]  PCP:  Duncan Gibson, NP  Cardiologist:  Wendie Hamburg, MD  Electrophysiologist:  None   Referring MD: Duncan Gibson*   Chief Complaint  Patient presents with   Aortic Stenosis         History of Present Illness:    Johnny [name] is a 78 y.o. male with a hx of CAD status post CABG in 2015, moderate aortic stenosis, diastolic heart failure, OSA, atrial fibrillation, symptomatic bradycardia status post PPM who presents for hospital follow-up.  He was admitted 11/2021 with chest pain.  He had rmoved from [place]  in 09/2021.  Echocardiogram 12/15/2021 showed EF 55 to 60%, low normal RV function, mild to moderate left atrial enlargement, small pericardial effusion, moderate aortic stenosis.  Lexiscan  Myoview  on 12/16/2021 showed no ischemia or infarction, EF 58%.  Echocardiogram 06/2022 showed EF 65 to 70%, normal RV function, moderate aortic stenosis (mean gradient 17 mmHg, V-max 2.9 m/s, VTI 1.2 cm, DI 0.37).  He was admitted 10/2022 with chest pain.  Echo 11/16/2022 showed EF 65 to 70%, grade 2 diastolic dysfunction, mild RV systolic dysfunction, moderate aortic stenosis.  LHC 11/17/2022 showed patent LIMA-LAD, occluded SVG-OM, patent mid to distal LCx stent, occluded distal LCx.  Echocardiogram 10/2023 showed EF 60 to 65%, G2DD, normal RV function, moderate to severe aortic stenosis (mean gradient 32 mmHg, V-max 3.9 m/s, AVA 0.9 cm)  Since last clinic visit, he reports he has been having issues with chest pain and dyspnea with exertion.  States that just walking from the waiting room to the office he feels short of breath.  Also gets chest pain and lightheadedness, but denies any syncope.  He is taking Eliquis , denies any bleeding issues.  No recent falls.   Wt Readings from Last 3 Encounters:  12/01/23 223 lb 9.6 oz (101.4 kg)  11/30/23 222 lb 9.6 oz (101 kg)   11/27/23 222 lb (100.7 kg)     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[place], [place]    Past Surgical History:  Procedure Laterality Date   BACK SURGERY  1975   Lower Back Surgery, Dr.Seltzer   BACK SURGERY  1985   Dr.Seltzer   LEFT HEART CATH AND CORS/GRAFTS ANGIOGRAPHY N/A 11/17/2022   Procedure: LEFT HEART CATH AND CORS/GRAFTS ANGIOGRAPHY;  Surgeon: Lucendia Rusk, MD;  Location: [facility];  Service: Cardiovascular;  Laterality: N/A;   SHOULDER SURGERY Left 1965   Dr.Seltzer   TONSILLECTOMY  1950   Dr.Perellie    Current Medications: Current Meds  Medication Sig   albuterol  (VENTOLIN  HFA) 108 (90 Base) MCG/ACT inhaler Inhale 1 puff into the lungs every 6 (six) hours as needed for wheezing or shortness of breath.   amiodarone  (PACERONE ) 200 MG tablet TAKE 1 TABLET BY MOUTH DAILY   carvedilol  (COREG ) 6.25 MG tablet TAKE 1 TABLET BY MOUTH TWICE  DAILY   cyanocobalamin  (VITAMIN B12) 1000 MCG tablet Take 1 tablet (1,000 mcg total) by mouth daily.   ELIQUIS  5 MG TABS tablet TAKE 1 TABLET BY MOUTH TWICE  DAILY   famotidine  (PEPCID ) 40 MG tablet Take 40 mg by mouth daily.   fluticasone  (FLONASE ) 50 MCG/ACT nasal spray Place 2 sprays into both nostrils 3 (three)  times daily as needed for allergies or rhinitis.   furosemide  (LASIX ) 40 MG tablet TAKE 1 TABLET BY MOUTH EVERY  OTHER DAY   glucose blood test strip 1 each by Other route daily. [facility] [facility] Glucometer   isosorbide  mononitrate (IMDUR ) 30 MG 24 hr tablet TAKE 1 TABLET BY MOUTH DAILY   loratadine  (CLARITIN ) 10 MG tablet Take 1 tablet (10 mg total) by mouth daily.   mirtazapine  (REMERON  SOL-TAB) 30 MG disintegrating tablet Take 1 tablet (30 mg total) by mouth at bedtime.   montelukast  (SINGULAIR ) 10 MG tablet Take 10 mg by mouth daily as needed (for allergies).   nitroGLYCERIN  (NITROSTAT ) 0.4  MG SL tablet DISSOLVE 1 TABLET UNDER THE  TONGUE EVERY 5 MINUTES AS NEEDED FOR CHEST PAIN. MAX OF 3 TABLETS IN 15 MINUTES. CALL 911 IF PAIN  PERSISTS.   pantoprazole  (PROTONIX ) 40 MG tablet TAKE 1 TABLET BY MOUTH DAILY  BEFORE BREAKFAST   ranolazine  (RANEXA ) 500 MG 12 hr tablet Take 1 tablet (500 mg total) by mouth 2 (two) times daily.   rosuvastatin  (CRESTOR ) 40 MG tablet TAKE 1 TABLET BY MOUTH DAILY   sodium chloride  (OCEAN) 0.65 % SOLN nasal spray Place 1 spray into both nostrils as needed for congestion.   TYLENOL  8 HOUR ARTHRITIS PAIN 650 MG CR tablet Take 650-1,300 mg by mouth every 8 (eight) hours as needed for pain.   venlafaxine  XR (EFFEXOR -XR) 150 MG 24 hr capsule TAKE 1 CAPSULE BY MOUTH DAILY  WITH BREAKFAST     Allergies:   Acacia and Metformin and related   Social History   Socioeconomic History   Marital status: Widowed    Spouse name: Not on file   Number of children: Not on file   Years of education: Not on file   Highest education level: 12th grade  Occupational History   Not on file  Tobacco Use   Smoking status: Former    Current packs/day: 1.00    Average packs/day: 1 pack/day for 10.0 years (10.0 ttl pk-yrs)    Types: Cigarettes   Smokeless tobacco: Never   Tobacco comments:    Quit at age 8  Vaping Use   Vaping status: Never Used  Substance and Sexual Activity   Alcohol use: Yes    Alcohol/week: 1.0 standard drink of alcohol    Types: 1 Glasses of wine per week    Comment: occasion   Drug use: Never   Sexual activity: Not on file  Other Topics Concern   Not on file  Social History Narrative   Tobacco use, amount per day now: None   Past tobacco use, amount per day: 1 pack   How many years did you use tobacco: 10 years.   Alcohol use (drinks per week): None   Diet:   Do you drink/eat things with caffeine: Yes   Marital status:  Widow                                What year were you married? 1972   Do you live in a house, apartment, assisted  living, condo, trailer, etc.? House   Is it one or more stories? One   How many persons live in your home? One   Do you have pets in your home?( please list) No   Highest Level of education completed? 12th grade.   Current or past profession: Psychologist, occupational, Investment banker, operational, Clinical biochemist.    Do  you exercise?   No                               Type and how often?   Do you have a living will? Yes   Do you have a DNR form?     No                              If not, do you want to discuss one?   Do you have signed POA/HPOA forms?  Yes                      If so, please bring to you appointment      Do you have any difficulty bathing or dressing yourself? No   Do you have any difficulty preparing food or eating? No   Do you have any difficulty managing your medications? No   Do you have any difficulty managing your finances? Yes   Do you have any difficulty affording your medications? No   Social Drivers of Corporate investment banker Strain: High Risk (07/02/2023)   Overall Financial Resource Strain (CARDIA)    Difficulty of Paying Living Expenses: Hard  Food Insecurity: Food Insecurity Present (07/02/2023)   Hunger Vital Sign    Worried About Running Out of Food in the Last Year: Sometimes true    Ran Out of Food in the Last Year: Sometimes true  Transportation Needs: Unmet Transportation Needs (07/02/2023)   PRAPARE - Administrator, Civil Service (Medical): Yes    Lack of Transportation (Non-Medical): No  Physical Activity: Unknown (07/02/2023)   Exercise Vital Sign    Days of Exercise per Week: 0 days    Minutes of Exercise per Session: Not on file  Stress: Stress Concern Present (07/02/2023)   [employer] of Occupational Health - Occupational Stress Questionnaire    Feeling of Stress : To some extent  Social Connections: Moderately Isolated (07/02/2023)   Social Connection and Isolation Panel [NHANES]    Frequency of Communication with Friends and Family: More than three times a  week    Frequency of Social Gatherings with Friends and Family: Never    Attends Religious Services: More than 4 times per year    Active Member of [employer] or Organizations: No    Attends Banker Meetings: Not on file    Marital Status: Widowed     Family History: The patient's family history includes Cancer in his father; Stroke in his brother and sister.  ROS:   Please see the history of present illness.     All other systems reviewed and are negative.  EKGs/Labs/Other Studies Reviewed:    The following studies were reviewed today:   EKG:   12/29/2021: Atrial paced rhythm, rate 60, diffuse less than 1 mm ST depressions 11/10/2022: Atrial paced rhythm, rate 63, nonspecific T wave flattening 02/14/2023: Atrial paced rhythm, rate 60, diffuse less than 1 mm ST depressions 12/01/23: Atrial paced rhythm, right bundle branch block, rate 60  Recent Labs: 02/14/2023: Magnesium 1.9 08/29/2023: BUN 29; Creat 2.10; Hemoglobin 15.9; Platelets 195; Potassium 4.0; Sodium 142; TSH 1.96  Recent Lipid Panel    Component Value Date/Time   CHOL 158 10/10/2022 1510   TRIG 154 (H) 10/10/2022 1510   HDL 70 10/10/2022 1510   CHOLHDL 2.3 10/10/2022 1510   VLDL  49 (H) 06/19/2022 1519   LDLCALC 64 10/10/2022 1510    Physical Exam:    VS:  BP (!) 100/50 (BP Location: Left Arm, Patient Position: Sitting)   Pulse 60   Ht 5\' 5"  (1.651 m)   Wt 223 lb 9.6 oz (101.4 kg)   SpO2 94%   BMI 37.21 kg/m     Wt Readings from Last 3 Encounters:  12/01/23 223 lb 9.6 oz (101.4 kg)  11/30/23 222 lb 9.6 oz (101 kg)  11/27/23 222 lb (100.7 kg)     GEN:  Well nourished, well developed in no acute distress HEENT: Normal NECK: No JVD; No carotid bruits CARDIAC: RRR, 2/6 systolic murmur RESPIRATORY:  Clear to auscultation without rales, wheezing or rhonchi  ABDOMEN: Soft, non-tender, non-distended MUSCULOSKELETAL:  No edema; No deformity  SKIN: Warm and dry NEUROLOGIC:  Alert and oriented x  3 PSYCHIATRIC:  Normal affect   ASSESSMENT:    1. CAD in native artery   2. Aortic valve stenosis, etiology of cardiac valve disease unspecified   3. Chronic heart failure with preserved ejection fraction (HFpEF) (HCC)   4. Essential hypertension   5. Paroxysmal atrial fibrillation (HCC)   6. Stage 3b chronic kidney disease (HCC)        PLAN:    Aortic stenosis: Moderate on echocardiogram 12/15/2021.  Repeat echo during hospitalization 06/2022 showed EF 65 to 70%, normal RV function, moderate aortic stenosis (mean gradient 17 mmHg, V-max 2.9 m/s, VTI 1.2 cm, DI 0.37).  Echocardiogram during hospitalization 10/2022 showed moderate aortic stenosis. Echocardiogram 10/2023 showed EF 60 to 65%, G2DD, normal RV function, moderate to severe aortic stenosis (mean gradient 32 mmHg, V-max 3.9 m/s, AVA 0.9 cm).  He is reporting dyspnea with minimal exertion, as well as chest pain and lightheadedness - Refer to valve clinic for TAVR evaluation  CAD: status post CABG.  Coronary angiography 06/07/21: "normal left main with severe mid-LAD disease and patent LIMA graft to LAD but with slow flow in the distal graft with possible filling defect vs streaming; in either case, the distal vessel is very small, too small for PCI. There were patent stents in second obtuse marginal branch. Distal circumflex was chronically occluded with right to left collaterals. Sequential SVG to two OM branches was chronically occluded. RCA contained a widely patent stent in proximal portion".  Echocardiogram 12/15/2021 showed EF 55 to 60%, low normal RV function, mild to moderate left atrial enlargement, small pericardial effusion, moderate aortic stenosis.  Lexiscan  Myoview  on 12/16/2021 showed no ischemia or infarction, EF 58%. LHC 11/17/2022 showed patent LIMA-LAD, occluded SVG-OM, patent mid to distal LCx stent, occluded distal LCx.   -Continue Eliquis , statin -Carvedilol  6.25 mg twice daily   Chronic diastolic heart failure:  Currently on lasix  40 mg every other day.  Check BMET, magnesium   Atrial fibrillation: On Eliquis  5 mg twice daily and carvedilol  6.25 mg twice daily.  In atrial paced rhythm.     Symptomatic bradycardia: Status post [employer] PPM.  Referred to Dr. Alvis Ba for device follow-up -Needs to reestablish with Dr. Alvis Ba for device management, will schedule   Hypertension: On carvedilol  6.25 mg twice daily.  Appears controlled   CKD stage IIIb: Baseline creatinine appears 1.5-1.8.  Creatinine 2.1 on 08/29/2023.  Check BMET   OSA: Severe OSA on sleep study 12/2021, needs BiPAP but was not started.  Referred to sleep medicine, planning to start Bipap   Hyperlipidemia: On rosuvastatin  40 mg daily.  LDL 64 on 10/10/2022.  Update lipid panel   RTC in 6 months   Medication Adjustments/Labs and Tests Ordered: Current medicines are reviewed at length with the patient today.  Concerns regarding medicines are outlined above.  Orders Placed This Encounter  Procedures   Lipid panel   Basic metabolic panel with GFR   CBC w/Diff   Magnesium   EKG 12-Lead   No orders of the defined types were placed in this encounter.   Patient Instructions  Medication Instructions:  Continue same medications *If you need a refill on your cardiac medications before your next appointment, please call your pharmacy*  Lab Work: Lipid panel,bmet,magnesium,cbc today  Testing/Procedures: None ordered  Follow-Up: At [facility], you and your health needs are our priority.  As part of our continuing mission to provide you with exceptional heart care, our providers are all part of one team.  This team includes your primary Cardiologist (physician) and Advanced Practice Providers or APPs (Physician Assistants and Nurse Practitioners) who all work together to provide you with the care you need, when you need it.  Your next appointment:  6 months  Call in July to schedule Nov appointment      Provider:  Dr.Jylan Loeza       Schedule appointment with Dr.Croitoru for pacemaker check   Refer to TAVR   someone from that dept will call with appointment     We recommend signing up for the patient portal called "MyChart".  Sign up information is provided on this After Visit Summary.  MyChart is used to connect with patients for Virtual Visits (Telemedicine).  Patients are able to view lab/test results, encounter notes, upcoming appointments, etc.  Non-urgent messages can be sent to your provider as well.   To learn more about what you can do with MyChart, go to [email or web address].          Signed, Wendie Hamburg, MD  12/01/2023 1:02 PM    [facility]

## 2023-11-27 ENCOUNTER — Ambulatory Visit: Payer: Medicare Other | Admitting: Primary Care

## 2023-11-27 ENCOUNTER — Encounter: Payer: Self-pay | Admitting: Primary Care

## 2023-11-27 VITALS — BP 111/78 | HR 60 | Temp 97.9°F | Ht 66.0 in | Wt 222.0 lb

## 2023-11-27 DIAGNOSIS — G4733 Obstructive sleep apnea (adult) (pediatric): Secondary | ICD-10-CM

## 2023-11-27 NOTE — Progress Notes (Signed)
 @Patient  ID: Johnny [name], male    DOB: 01/19/1946, 78 y.o.   MRN: [id number]  Chief Complaint  Patient presents with   Follow-up    Resp. Failure and OSA Sleeps with 2-3 L o2 but wants a BIPAP script     Referring provider: Medina-Vargas, Monina C*  HPI: 78 year old male, former smoker.  Past medical history significant for heart failure, coronary artery disease, hypertension, NSTEMI, A-fib, aortic stenosis, chronic respiratory failure with hypoxia, OSA, chronic kidney disease, hyperlipidemia.  Previous [facility] pulmonary encounter: 05/30/2023 Patient presents today for sleep consult. Patient was referred by cardiology.  Patient has symptoms of loud snoring and excessive daytime sleepiness. He was noted to have severe obstructive sleep apnea on sleep study from 12/2021, AHI 60.5/hr. Ordered for BiPAP titration study but never completed. Reviewed sleep study results today. He is agreeing to have BIPAP titration study done and re-starting PAP therapy. He wears oxygen  at night. Not currently on CPAP.   He would like a letter stating that due to his sleep apnea he was not getting enough sleep causing daytime sleepiness and that he can return to work at [employer] once he is back using BIPAP regularly.   Sleep questionnaire Symptoms-   loud snoring, restless sleep  Prior sleep study-he said 3 previous sleep studies Bedtime-11 PM Time to fall asleep-at times he is not able to fall asleep Nocturnal awakenings-3-5 times Out of bed/start of day-2 AM/ sometimes noon  Weight changes-10 pounds Do you operate heavy machinery-no Do you currently wear CPAP-no Do you current wear oxygen -at night Epworth-23  Social Hx: Patient is widowed. No children. Retired. Drinks wine at dinner. He quit smoking in 1985.  OSA (obstructive sleep apnea) - Patient has symptoms of loud snoring and excessive daytime sleepiness.  Epworth score 23.  Referred by cardiology.  Sleep study on 01/28/23 showed severe  OSA, AHI 60.5/hour.  Reviewed sleep study results and risks of untreated sleep apnea.  Patient needs BiPAP titration study and in agreement with plan. Once he is back on PAP therapy and compliant with use we can provide him a letter for work.    Recommendations: - Once you get BIPAP aim to wear nightly 4-6 hours  - Focus on side sleeping position or elevate head with wedge pillow 30 degrees - Work on weight loss efforts if able  - Do not drive if experiencing excessive daytime sleepiness of fatigue    Orders: - BIPAP titration study    Follow-up: - 3 months with Beth NP for PAP compliance    09/26/2023 Patient presents today for 3 month follow-up/OSA. 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  Patient has symptoms of loud snoring and excessive daytime sleepiness.  Epworth score 23.  Patient underwent BIPAP titration study in November, optimal pressure selected for patient 24/20 cm H2O.  Patient had severe oxygen  desaturations.  Minimal O2 at optimal pressure was 88%. BiPAP therapy on IPAP 24/ EPAP 20 cm H2O with a Large size [employer] Full Face Evora Full mask and heated humidification.   He tells me that he BIPAP order was sent to the wrong DME company, he had previous billing issues with [facility] and he would like order be sent to [facility]. He is using 2-3L oxygen  at night. Oxygen  supplies by [employer] [facility].      11/27/2023- interim hx  Discussed the use of AI scribe software for clinical note transcription with the patient, who gave verbal consent to proceed.  History of Present Illness  Johnny [name] is a 78 year old male with sleep apnea who presents for a follow-up regarding his BiPAP therapy.  He has a history of sleep apnea, initially diagnosed with symptoms of snoring and fatigue. A CPAP titration study revealed oxygen  desaturation to 77%, and optimal pressure settings were determined to be 24/ 20cm h20. An order for a BiPAP machine was placed in November. [employer] reports  that they reached out to patient regarding BIPAP machine but were unable to connect with him. He is currently using oxygen  at night until he receives his BiPAP machine.  He has been experiencing ongoing snoring and sleep disruption, which have impacted his ability to work. He previously worked for [employer] [place] but had to resign due to sleep-related issues. 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  He mentions having lab work done in January that showed abnormal thyroid  and calcium  levels. He had an ultrasound done, but the results were unclear. He is following with primary care and endocrinology.      Allergies  Allergen Reactions   Acacia Itching   Metformin And Related Other (See Comments)    Metformin caused the patient's legs to become weak    Immunization History  Administered Date(s) Administered   Fluad Quad(high Dose 65+) 05/06/2020, 06/22/2022   Fluad Trivalent(High Dose 65+) 04/19/2023   Influenza Split 09/15/2009, 05/03/2013   Influenza, High Dose Seasonal PF 06/05/2014, 04/20/2015, 06/08/2016, 04/22/2017, 04/15/2021   Influenza-Unspecified 07/14/2004, 05/07/2007, 05/02/2008, 05/01/2018, 04/29/2019   PPD Test 06/08/2021   Pneumococcal Conjugate-13 06/05/2014   Pneumococcal Polysaccharide-23 08/13/2012   Tdap 11/04/2019    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[place], [place]    Tobacco History: Social History   Tobacco Use  Smoking Status Former   Current packs/day: 1.00   Average packs/day: 1 pack/day for 10.0 years (10.0 ttl pk-yrs)   Types: Cigarettes  Smokeless Tobacco Never  Tobacco Comments   Quit at age 14   Counseling given: Not Answered Tobacco comments: Quit at age 70   Outpatient Medications Prior to Visit  Medication Sig Dispense Refill   albuterol  (VENTOLIN  HFA) 108 (90  Base) MCG/ACT inhaler Inhale 1 puff into the lungs every 6 (six) hours as needed for wheezing or shortness of breath.     amiodarone  (PACERONE ) 200 MG tablet TAKE 1 TABLET BY MOUTH DAILY 100 tablet 2   carvedilol  (COREG ) 6.25 MG tablet TAKE 1 TABLET BY MOUTH TWICE  DAILY 200 tablet 2   cyanocobalamin  (VITAMIN B12) 1000 MCG tablet Take 1 tablet (1,000 mcg total) by mouth daily. 90 tablet 1   ELIQUIS  5 MG TABS tablet TAKE 1 TABLET BY MOUTH TWICE  DAILY 200 tablet 2   famotidine  (PEPCID ) 40 MG tablet Take 40 mg by mouth daily.     fluticasone  (FLONASE ) 50 MCG/ACT nasal spray Place 2 sprays into both nostrils 3 (three) times daily as needed for allergies or rhinitis.     furosemide  (LASIX ) 40 MG tablet TAKE 1 TABLET BY MOUTH EVERY  OTHER DAY 50 tablet 2   glucose blood test strip 1 each by Other route daily. [facility] Glucometer 100 each 12   isosorbide  mononitrate (IMDUR ) 30 MG 24 hr tablet TAKE 1 TABLET BY MOUTH DAILY 100 tablet 2   loratadine  (CLARITIN ) 10 MG tablet Take  1 tablet (10 mg total) by mouth daily. 30 tablet 11   mirtazapine  (REMERON  SOL-TAB) 30 MG disintegrating tablet Take 1 tablet (30 mg total) by mouth at bedtime. 90 tablet 3   montelukast  (SINGULAIR ) 10 MG tablet Take 10 mg by mouth daily as needed (for allergies).     nitroGLYCERIN  (NITROSTAT ) 0.4 MG SL tablet DISSOLVE 1 TABLET UNDER THE  TONGUE EVERY 5 MINUTES AS NEEDED FOR CHEST PAIN. MAX OF 3 TABLETS IN 15 MINUTES. CALL 911 IF PAIN  PERSISTS. 75 tablet 4   pantoprazole  (PROTONIX ) 40 MG tablet TAKE 1 TABLET BY MOUTH DAILY  BEFORE BREAKFAST 100 tablet 2   ranolazine  (RANEXA ) 500 MG 12 hr tablet Take 1 tablet (500 mg total) by mouth 2 (two) times daily. 180 tablet 3   rosuvastatin  (CRESTOR ) 40 MG tablet TAKE 1 TABLET BY MOUTH DAILY 100 tablet 2   sodium chloride  (OCEAN) 0.65 % SOLN nasal spray Place 1 spray into both nostrils as needed for congestion. 88 mL 3   TYLENOL  8 HOUR ARTHRITIS PAIN 650 MG CR tablet Take 650-1,300 mg by  mouth every 8 (eight) hours as needed for pain.     venlafaxine  XR (EFFEXOR -XR) 150 MG 24 hr capsule TAKE 1 CAPSULE BY MOUTH DAILY  WITH BREAKFAST 90 capsule 3   No facility-administered medications prior to visit.    Review of Systems  Review of Systems  Constitutional: Negative.   HENT: Negative.    Respiratory: Negative.    Psychiatric/Behavioral: Negative.     Physical Exam  BP 111/78 (BP Location: Left Arm, Patient Position: Sitting, Cuff Size: Large)   Pulse 60   Temp 97.9 F (36.6 C) (Temporal)   Ht 5\' 6"  (1.676 m)   Wt 222 lb (100.7 kg)   SpO2 94%   BMI 35.83 kg/m  Physical Exam Constitutional:      General: He is not in acute distress.    Appearance: Normal appearance. He is not ill-appearing.  HENT:     Head: Normocephalic and atraumatic.     Mouth/Throat:     Mouth: Mucous membranes are moist.     Pharynx: Oropharynx is clear.  Cardiovascular:     Rate and Rhythm: Normal rate and regular rhythm.  Pulmonary:     Effort: Pulmonary effort is normal.     Breath sounds: Normal breath sounds.  Musculoskeletal:        General: Normal range of motion.  Skin:    General: Skin is warm and dry.  Neurological:     General: No focal deficit present.     Mental Status: He is alert and oriented to person, place, and time. Mental status is at baseline.  Psychiatric:        Mood and Affect: Mood normal.        Behavior: Behavior normal.        Thought Content: Thought content normal.        Judgment: Judgment normal.      Lab Results:  CBC    Component Value Date/Time   WBC 9.7 08/29/2023 0857   RBC 5.55 08/29/2023 0857   HGB 15.9 08/29/2023 0857   HGB 15.2 12/29/2021 0944   HCT 49.5 08/29/2023 0857   HCT 44.7 12/29/2021 0944   PLT 195 08/29/2023 0857   PLT 232 12/29/2021 0944   MCV 89.2 08/29/2023 0857   MCV 82 12/29/2021 0944   MCH 28.6 08/29/2023 0857   MCHC 32.1 08/29/2023 0857   RDW 16.0 (H) 08/29/2023 0857  RDW 18.5 (H) 12/29/2021 0944    LYMPHSABS 1.5 11/15/2022 1816   MONOABS 0.9 11/15/2022 1816   EOSABS 252 08/29/2023 0857   BASOSABS 39 08/29/2023 0857    BMET    Component Value Date/Time   NA 142 08/29/2023 0857   NA 140 05/24/2023 0000   K 4.0 08/29/2023 0857   CL 98 08/29/2023 0857   CO2 31 08/29/2023 0857   GLUCOSE 108 (H) 08/29/2023 0857   BUN 29 (H) 08/29/2023 0857   BUN 20 05/24/2023 0000   CREATININE 2.10 (H) 08/29/2023 0857   CALCIUM  11.6 (H) 08/29/2023 0857   CALCIUM  11.6 (H) 08/29/2023 0857   GFRNONAA 43 (L) 11/18/2022 0715    BNP    Component Value Date/Time   BNP 75.9 11/15/2022 1800    ProBNP No results found for: "PROBNP"  Imaging: US  THYROID  Result Date: 11/26/2023 CLINICAL DATA:  Other. Hyperparathyroidism. 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 EXAM: THYROID  ULTRASOUND TECHNIQUE: Ultrasound examination of the thyroid  gland and adjacent soft tissues was performed. COMPARISON:  Nuclear medicine sestamibi study 10/25/2023 FINDINGS: Parenchymal Echotexture: Moderately heterogenous Isthmus: 0.5 cm Right lobe: 3.1 x 1.3 x 1.1 cm Left lobe: 4.0 x 1.8 x 1.7 cm _________________________________________________________ Estimated total number of nodules >/= 1 cm: 0 Number of spongiform nodules >/=  2 cm not described below (TR1): 0 Number of mixed cystic and solid nodules >/= 1.5 cm not described below (TR2): 0 _________________________________________________________ Heterogeneous thyroid  gland.  No discrete thyroid  nodules. Posterior and just deep to the left thyroid  gland is a 1.5 x 1.4 x 1.2 cm hypoechoic solid nodule most consistent with a parathyroid  adenoma. IMPRESSION: 1. Heterogeneous thyroid  gland without evidence of discrete thyroid  nodule. 2. Small 1.5 cm hypoechoic solid nodule posterior to the lower pole of the left thyroid  gland consistent with the clinical suspicion of parathyroid  adenoma. Electronically Signed   By: Fernando Hoyer M.D.   On: 11/26/2023 09:28   ECHOCARDIOGRAM  COMPLETE Result Date: 11/06/2023    ECHOCARDIOGRAM REPORT   Patient Name:   YUSSEF VONASEK Date of Exam: 11/06/2023 Medical Rec #:  [id number]     Height:       66.0 in Accession #:    [id number]    Weight:       226.0 lb Date of Birth:  02-16-1946    BSA:          2.106 m Patient Age:    77 years      BP:           128/74 mmHg Patient Gender: M             HR:           62 bpm. Exam Location:  [address] Procedure: 2D Echo, Color Doppler and Cardiac Doppler (Both Spectral and Color            Flow Doppler were utilized during procedure). Indications:    Aortic Stenosis I35.0  History:        Patient has prior history of Echocardiogram examinations, most                 recent 11/16/2022. Arrythmias:Atrial Fibrillation; Risk                 Factors:Hypertension.  Sonographer:    Joleen Navy RDCS Referring Phys: [id number] CHRISTOPHER L SCHUMANN IMPRESSIONS  1. Left ventricular ejection fraction, by estimation, is 60 to 65%. The left ventricle has normal function. The left ventricle has no regional wall  motion abnormalities. There is mild concentric left ventricular hypertrophy. Left ventricular diastolic parameters are consistent with Grade II diastolic dysfunction (pseudonormalization).  2. Right ventricular systolic function is normal. The right ventricular size is normal. There is normal pulmonary artery systolic pressure.  3. Left atrial size was mildly dilated.  4. The mitral valve is normal in structure. Trivial mitral valve regurgitation. No evidence of mitral stenosis.  5. The aortic valve is tricuspid. There is moderate calcification of the aortic valve. Aortic valve regurgitation is mild. Moderate to severe aortic valve stenosis. Aortic valve area, by VTI measures 0.94 cm. Aortic valve mean gradient measures 32.0 mmHg. Aortic valve Vmax measures 3.86 m/s.  6. Aortic dilatation noted. There is mild dilatation of the ascending aorta, measuring 41 mm.  7. The inferior vena cava is normal in size with  greater than 50% respiratory variability, suggesting right atrial pressure of 3 mmHg. FINDINGS  Left Ventricle: Left ventricular ejection fraction, by estimation, is 60 to 65%. The left ventricle has normal function. The left ventricle has no regional wall motion abnormalities. The left ventricular internal cavity size was normal in size. There is  mild concentric left ventricular hypertrophy. Left ventricular diastolic parameters are consistent with Grade II diastolic dysfunction (pseudonormalization). Right Ventricle: The right ventricular size is normal. No increase in right ventricular wall thickness. Right ventricular systolic function is normal. There is normal pulmonary artery systolic pressure. The tricuspid regurgitant velocity is 2.37 m/s, and  with an assumed right atrial pressure of 3 mmHg, the estimated right ventricular systolic pressure is 25.5 mmHg. Left Atrium: Left atrial size was mildly dilated. Right Atrium: Right atrial size was normal in size. Pericardium: There is no evidence of pericardial effusion. Mitral Valve: The mitral valve is normal in structure. Mild mitral annular calcification. Trivial mitral valve regurgitation. No evidence of mitral valve stenosis. Tricuspid Valve: The tricuspid valve is normal in structure. Tricuspid valve regurgitation is mild . No evidence of tricuspid stenosis. Aortic Valve: The aortic valve is tricuspid. There is moderate calcification of the aortic valve. Aortic valve regurgitation is mild. Moderate to severe aortic stenosis is present. Aortic valve mean gradient measures 32.0 mmHg. Aortic valve peak gradient  measures 59.6 mmHg. Aortic valve area, by VTI measures 0.94 cm. Pulmonic Valve: The pulmonic valve was normal in structure. Pulmonic valve regurgitation is trivial. No evidence of pulmonic stenosis. Aorta: Aortic dilatation noted. There is mild dilatation of the ascending aorta, measuring 41 mm. Venous: The inferior vena cava is normal in size with  greater than 50% respiratory variability, suggesting right atrial pressure of 3 mmHg. IAS/Shunts: No atrial level shunt detected by color flow Doppler.  LEFT VENTRICLE PLAX 2D LVIDd:         5.00 cm     Diastology LVIDs:         3.20 cm     LV e' medial:    4.73 cm/s LV PW:         1.20 cm     LV E/e' medial:  15.6 LV IVS:        1.30 cm     LV e' lateral:   6.66 cm/s LVOT diam:     2.00 cm     LV E/e' lateral: 11.1 LV SV:         66 LV SV Index:   31 LVOT Area:     3.14 cm  LV Volumes (MOD) LV vol d, MOD A2C: 49.8 ml LV vol d, MOD A4C: 53.3 ml  LV vol s, MOD A2C: 22.4 ml LV vol s, MOD A4C: 24.3 ml LV SV MOD A2C:     27.4 ml LV SV MOD A4C:     53.3 ml LV SV MOD BP:      31.7 ml RIGHT VENTRICLE            IVC RV Basal diam:  3.70 cm    IVC diam: 1.70 cm RV Mid diam:    3.10 cm RV S prime:     8.15 cm/s TAPSE (M-mode): 1.8 cm LEFT ATRIUM             Index        RIGHT ATRIUM           Index LA diam:        4.40 cm 2.09 cm/m   RA Area:     13.70 cm LA Vol (A2C):   77.9 ml 36.98 ml/m  RA Volume:   28.60 ml  13.58 ml/m LA Vol (A4C):   47.1 ml 22.36 ml/m LA Biplane Vol: 63.0 ml 29.91 ml/m  AORTIC VALVE AV Area (Vmax):    0.85 cm AV Area (Vmean):   0.81 cm AV Area (VTI):     0.94 cm AV Vmax:           386.00 cm/s AV Vmean:          254.000 cm/s AV VTI:            0.705 m AV Peak Grad:      59.6 mmHg AV Mean Grad:      32.0 mmHg LVOT Vmax:         105.00 cm/s LVOT Vmean:        65.700 cm/s LVOT VTI:          0.211 m LVOT/AV VTI ratio: 0.30  AORTA Ao Root diam: 3.30 cm Ao Asc diam:  4.10 cm MITRAL VALVE               TRICUSPID VALVE MV Area (PHT): 3.53 cm    TR Peak grad:   22.5 mmHg MV Decel Time: 215 msec    TR Vmax:        237.00 cm/s MV E velocity: 73.90 cm/s MV A velocity: 53.00 cm/s  SHUNTS MV E/A ratio:  1.39        Systemic VTI:  0.21 m                            Systemic Diam: 2.00 cm Jules Oar MD Electronically signed by Jules Oar MD Signature Date/Time: 11/06/2023/11:40:56 AM    Final       Assessment & Plan:   1. OSA (obstructive sleep apnea) (Primary) - Ambulatory Referral for DME  Assessment and Plan    Obstructive Sleep Apnea Obstructive sleep apnea with snoring and sleep disruption. Previous CPAP titration showed oxygen  desaturation to 77% with optimal BiPAP settings at IPAP 24/ EPAP 20. Awaiting BiPAP delivery. - Fax BiPAP order to provided number. - Instruct to use oxygen  at night until BiPAP obtained. - Schedule follow-up in 3 months for compliance check. - Advise to contact clinic if BiPAP not received within one week.     Antonio Baumgarten, NP 11/27/2023

## 2023-11-27 NOTE — Patient Instructions (Addendum)
 -  OBSTRUCTIVE SLEEP APNEA: Obstructive sleep apnea is a condition where your airway becomes blocked during sleep, causing snoring and disrupted sleep. Your previous CPAP study showed significant oxygen  desaturation, and you are awaiting a BiPAP machine with specific pressure settings. Continue using oxygen  at night until you receive your BiPAP machine. We will fax the BiPAP order and contact the provider today. Once you have the BiPAP, we will perform an overnight oximetry test to ensure it is working correctly. Please contact the clinic if you do not receive your BiPAP within one week.  INSTRUCTIONS:  Schedule a follow-up appointment in 3 months to check your compliance with the BiPAP therapy. Contact the clinic if you do not receive your BiPAP machine within one week.

## 2023-11-29 ENCOUNTER — Encounter

## 2023-11-29 ENCOUNTER — Ambulatory Visit: Payer: Self-pay

## 2023-11-29 NOTE — Telephone Encounter (Signed)
 Chief Complaint: Back Pain  Symptoms: Shoulder pain, leg pain, knee pain, back pain Frequency: last couple of months Pertinent Negatives: Patient denies CP, SOB Disposition: [] ED /[] Urgent Care (no appt availability in office) / [x] Appointment(In office/virtual)/ []  [place] [facility]/ [] Home Care/ [] Refused Recommended Disposition /[]  Mobile Bus/ []  Follow-up with PCP Additional Notes: patient called with complicated hx of back pain. Patient states he had surgery at one point and had hardware placed to stabilize his back. Patient reports he fell a year ago and it concerned with his pain. Patient also reports bilateral leg and knee pain along with right shoulder pain. Patient states he has been taking OTC arthritis medication with no help. Wife can be heard on the phone stating that his symptoms are getting worse. Patient also states that he has questions about his thyroid  tests that he only wants to speak with his provider about. Patient is requesting an appointment. Per protocol, patient is recommended to be seen within three days. Appointment scheduled for May 1, 2-25 at 2:00 PM with PCP. Patient verbalized understanding and all questions answered.    Copied from CRM [id number]. Topic: Clinical - Red Word Triage >> Nov 29, 2023  3:13 PM Jayson Michael wrote: [facility] that prompted transfer to Nurse Triage:  pain  Patient states they are experiencing significant pain reports pain throughout the entire right side, including back, legs, and shoulder. States they can hardly move and are currently using a walker. Describes severe pain in the back of the legs that intensifies upon standing or walking. Patient also mentioned wanting to come in to discuss thyroid  test results. Reports that the pain has been ongoing for a long time. Reason for Disposition  [1] MODERATE back pain (e.g., interferes with normal activities) AND [2] present > 3 days  [1] MODERATE pain (e.g., interferes with normal  activities, limping) AND [2] present > 3 days  Answer Assessment - Initial Assessment Questions 1. ONSET: "When did the pain start?"      2 months ago 2. LOCATION: "Where is the pain located?"      both 3. PAIN: "How bad is the pain?"    (Scale 1-10; or mild, moderate, severe)   -  MILD (1-3): doesn't interfere with normal activities    -  MODERATE (4-7): interferes with normal activities (e.g., work or school) or awakens from sleep, limping    -  SEVERE (8-10): excruciating pain, unable to do any normal activities, unable to walk     7 out of 10 4. WORK OR EXERCISE: "Has there been any recent work or exercise that involved this part of the body?"      no 5. CAUSE: "What do you think is causing the leg pain?"     unsure 6. OTHER SYMPTOMS: "Do you have any other symptoms?" (e.g., chest pain, back pain, breathing difficulty, swelling, rash, fever, numbness, weakness)     Back pain, knee pain, shoulder pain, numbness to feet  Answer Assessment - Initial Assessment Questions 1. ONSET: "When did the pain begin?"      Patient states he fell a year ago 2. LOCATION: "Where does it hurt?" (upper, mid or lower back)     Lower back 3. SEVERITY: "How bad is the pain?"  (e.g., Scale 1-10; mild, moderate, or severe)   - MILD (1-3): Doesn't interfere with normal activities.    - MODERATE (4-7): Interferes with normal activities or awakens from sleep.    - SEVERE (8-10): Excruciating pain, unable  to do any normal activities.      7-8 out of 10 4. PATTERN: "Is the pain constant?" (e.g., yes, no; constant, intermittent)      constant 5. RADIATION: "Does the pain shoot into your legs or somewhere else?"     Down legs 6. CAUSE:  "What do you think is causing the back pain?"      unsure 7. BACK OVERUSE:  "Any recent lifting of heavy objects, strenuous work or exercise?"     no 8. MEDICINES: "What have you taken so far for the pain?" (e.g., nothing, acetaminophen , NSAIDS)     Arthritis relieft 9.  NEUROLOGIC SYMPTOMS: "Do you have any weakness, numbness, or problems with bowel/bladder control?"    no 10. OTHER SYMPTOMS: "Do you have any other symptoms?" (e.g., fever, abdomen pain, burning with urination, blood in urine)       Leg pain, knee pain  Protocols used: Leg Pain-A-AH, Back Pain-A-AH

## 2023-11-29 NOTE — Telephone Encounter (Signed)
 Message routed to Monina, NP as FYI. Patient scheduled to see you.

## 2023-11-30 ENCOUNTER — Ambulatory Visit
Admission: RE | Admit: 2023-11-30 | Discharge: 2023-11-30 | Disposition: A | Discharge status: Home / Self Care | Source: Ambulatory Visit | Attending: Adult Health | Admitting: Adult Health

## 2023-11-30 ENCOUNTER — Ambulatory Visit: Admitting: Adult Health

## 2023-11-30 ENCOUNTER — Encounter: Admitting: Psychology

## 2023-11-30 ENCOUNTER — Encounter: Payer: Self-pay | Admitting: Adult Health

## 2023-11-30 VITALS — BP 108/72 | HR 60 | Temp 97.5°F | Ht 66.0 in | Wt 222.6 lb

## 2023-11-30 DIAGNOSIS — F322 Major depressive disorder, single episode, severe without psychotic features: Secondary | ICD-10-CM | POA: Diagnosis not present

## 2023-11-30 DIAGNOSIS — G8929 Other chronic pain: Secondary | ICD-10-CM | POA: Diagnosis not present

## 2023-11-30 DIAGNOSIS — M25511 Pain in right shoulder: Secondary | ICD-10-CM | POA: Diagnosis not present

## 2023-11-30 DIAGNOSIS — N1832 Chronic kidney disease, stage 3b: Secondary | ICD-10-CM

## 2023-11-30 DIAGNOSIS — K219 Gastro-esophageal reflux disease without esophagitis: Secondary | ICD-10-CM | POA: Diagnosis not present

## 2023-11-30 DIAGNOSIS — M545 Low back pain, unspecified: Secondary | ICD-10-CM | POA: Diagnosis not present

## 2023-11-30 DIAGNOSIS — I5032 Chronic diastolic (congestive) heart failure: Secondary | ICD-10-CM | POA: Diagnosis not present

## 2023-11-30 DIAGNOSIS — I25118 Atherosclerotic heart disease of native coronary artery with other forms of angina pectoris: Secondary | ICD-10-CM

## 2023-11-30 DIAGNOSIS — M19011 Primary osteoarthritis, right shoulder: Secondary | ICD-10-CM | POA: Diagnosis not present

## 2023-11-30 DIAGNOSIS — I129 Hypertensive chronic kidney disease with stage 1 through stage 4 chronic kidney disease, or unspecified chronic kidney disease: Secondary | ICD-10-CM | POA: Diagnosis not present

## 2023-11-30 DIAGNOSIS — I480 Paroxysmal atrial fibrillation: Secondary | ICD-10-CM

## 2023-11-30 DIAGNOSIS — M25561 Pain in right knee: Secondary | ICD-10-CM | POA: Diagnosis not present

## 2023-11-30 DIAGNOSIS — G4733 Obstructive sleep apnea (adult) (pediatric): Secondary | ICD-10-CM | POA: Diagnosis not present

## 2023-11-30 DIAGNOSIS — Z981 Arthrodesis status: Secondary | ICD-10-CM | POA: Diagnosis not present

## 2023-11-30 DIAGNOSIS — E782 Mixed hyperlipidemia: Secondary | ICD-10-CM | POA: Diagnosis not present

## 2023-11-30 DIAGNOSIS — M1711 Unilateral primary osteoarthritis, right knee: Secondary | ICD-10-CM | POA: Diagnosis not present

## 2023-11-30 NOTE — Progress Notes (Unsigned)
 [facility] clinic  Provider:  Inge Mangle DNP  Code Status:  Full Code  Goals of Care:     09/18/2023   10:05 AM  Advanced Directives  Does Patient Have a Medical Advance Directive? No  Would patient like information on creating a medical advance directive? No - Patient declined     Chief Complaint  Patient presents with   Back Pain    Back pain hx of surgery had cage put in back pt fell  , right knee pain that has been going for some time. Pt otc medication , patient stated that he having pain on right side, pt stated that he is having some sob, discuss results of jUS thyroid      Discussed the use of AI scribe software for clinical note transcription with the patient, who gave verbal consent to proceed.  HPI: Patient is a 77 y.o. 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[place], [place]    Past Surgical History:  Procedure Laterality Date   BACK SURGERY  1975   Lower Back Surgery, Dr.Seltzer   BACK SURGERY  1985   Dr.Seltzer   LEFT HEART CATH AND CORS/GRAFTS ANGIOGRAPHY N/A 11/17/2022   Procedure: LEFT HEART CATH AND CORS/GRAFTS ANGIOGRAPHY;  Surgeon: Lucendia Rusk, MD;  Location: [facility];  Service: Cardiovascular;  Laterality: N/A;   SHOULDER SURGERY Left 1965   Dr.Seltzer   TONSILLECTOMY  1950   Dr.Perellie    Allergies  Allergen Reactions   Acacia Itching   Metformin And Related Other (See Comments)    Metformin caused the patient's legs to become weak    Outpatient Encounter Medications as of 11/30/2023  Medication Sig   albuterol  (VENTOLIN  HFA) 108 (90 Base) MCG/ACT inhaler Inhale 1 puff into the lungs every 6 (six) hours as needed for wheezing or shortness of breath.   carvedilol  (COREG ) 6.25 MG tablet TAKE 1 TABLET BY MOUTH TWICE  DAILY   ELIQUIS  5 MG TABS tablet TAKE 1  TABLET BY MOUTH TWICE  DAILY   famotidine  (PEPCID ) 40 MG tablet Take 40 mg by mouth daily.   furosemide  (LASIX ) 40 MG tablet TAKE 1 TABLET BY MOUTH EVERY  OTHER DAY   isosorbide  mononitrate (IMDUR ) 30 MG 24 hr tablet TAKE 1 TABLET BY MOUTH DAILY   montelukast  (SINGULAIR ) 10 MG tablet Take 10 mg by mouth daily as needed (for allergies).   nitroGLYCERIN  (NITROSTAT ) 0.4 MG SL tablet DISSOLVE 1 TABLET UNDER THE  TONGUE EVERY 5 MINUTES AS NEEDED FOR CHEST PAIN. MAX OF 3 TABLETS IN 15 MINUTES. CALL 911 IF PAIN  PERSISTS.   pantoprazole  (PROTONIX ) 40 MG tablet TAKE 1 TABLET BY MOUTH DAILY  BEFORE BREAKFAST   rosuvastatin  (CRESTOR ) 40 MG tablet TAKE 1 TABLET BY MOUTH DAILY   TYLENOL  8 HOUR ARTHRITIS PAIN 650 MG CR tablet Take 650-1,300 mg by mouth every 8 (eight) hours as needed for pain.   venlafaxine  XR (EFFEXOR -XR) 150 MG 24 hr capsule TAKE 1 CAPSULE BY MOUTH DAILY  WITH BREAKFAST   amiodarone  (PACERONE ) 200 MG tablet TAKE 1 TABLET BY MOUTH DAILY   cyanocobalamin  (VITAMIN B12) 1000 MCG tablet Take 1 tablet (1,000 mcg total) by mouth daily. (Patient not taking: Reported on 11/30/2023)   fluticasone  (FLONASE ) 50 MCG/ACT  nasal spray Place 2 sprays into both nostrils 3 (three) times daily as needed for allergies or rhinitis. (Patient not taking: Reported on 11/30/2023)   glucose blood test strip 1 each by Other route daily. [facility] Glucometer (Patient not taking: Reported on 11/30/2023)   loratadine  (CLARITIN ) 10 MG tablet Take 1 tablet (10 mg total) by mouth daily.   mirtazapine  (REMERON  SOL-TAB) 30 MG disintegrating tablet Take 1 tablet (30 mg total) by mouth at bedtime. (Patient not taking: Reported on 11/30/2023)   ranolazine  (RANEXA ) 500 MG 12 hr tablet Take 1 tablet (500 mg total) by mouth 2 (two) times daily.   sodium chloride  (OCEAN) 0.65 % SOLN nasal spray Place 1 spray into both nostrils as needed for congestion. (Patient not taking: Reported on 11/30/2023)   No facility-administered encounter  medications on file as of 11/30/2023.    Review of Systems:  Review of Systems  Constitutional:  Negative for activity change, appetite change and fever.  HENT:  Negative for sore throat.   Eyes: Negative.   Cardiovascular:  Negative for chest pain and leg swelling.  Gastrointestinal:  Negative for abdominal distention, diarrhea and vomiting.  Genitourinary:  Negative for dysuria, frequency and urgency.  Skin:  Negative for color change.  Neurological:  Negative for dizziness and headaches.  Psychiatric/Behavioral:  Negative for behavioral problems and sleep disturbance. The patient is not nervous/anxious.     Health Maintenance  Topic Date Due   COVID-19 Vaccine (1) Never done   Hepatitis C Screening  Never done   Zoster Vaccines- Shingrix (1 of 2) Never done   OPHTHALMOLOGY EXAM  09/01/2022   Medicare Annual Wellness (AWV)  12/01/2023   Diabetic kidney evaluation - Urine ACR  04/18/2028 (Originally 05/16/1964)   INFLUENZA VACCINE  03/01/2024   HEMOGLOBIN A1C  03/17/2024   Diabetic kidney evaluation - eGFR measurement  08/28/2024   DTaP/Tdap/Td (2 - Td or Tdap) 11/03/2029   Pneumonia Vaccine 50+ Years old  Completed   HPV VACCINES  Aged Out   Meningococcal B Vaccine  Aged Out   FOOT EXAM  Discontinued    Physical Exam: Vitals:   11/30/23 1421  BP: 108/72  Pulse: 60  Temp: (!) 97.5 F (36.4 C)  TempSrc: Temporal  SpO2: 94%  Weight: 222 lb 9.6 oz (101 kg)  Height: 5\' 6"  (1.676 m)   Body mass index is 35.93 kg/m. Physical Exam Constitutional:      Appearance: He is obese.  HENT:     Head: Normocephalic and atraumatic.     Mouth/Throat:     Mouth: Mucous membranes are moist.  Eyes:     Conjunctiva/sclera: Conjunctivae normal.  Cardiovascular:     Rate and Rhythm: Normal rate and regular rhythm.     Pulses: Normal pulses.     Heart sounds: Normal heart sounds.  Pulmonary:     Effort: Pulmonary effort is normal.     Breath sounds: Normal breath sounds.   Abdominal:     General: Bowel sounds are normal.     Palpations: Abdomen is soft.  Musculoskeletal:        General: No swelling. Normal range of motion.     Cervical back: Normal range of motion.  Skin:    General: Skin is warm and dry.  Neurological:     General: No focal deficit present.     Mental Status: He is alert and oriented to person, place, and time.  Psychiatric:        Mood and  Affect: Mood normal.        Behavior: Behavior normal.        Thought Content: Thought content normal.        Judgment: Judgment normal.     Labs reviewed: Basic Metabolic Panel: Recent Labs    02/14/23 1500 04/19/23 1443 05/31/23 1446 07/03/23 1421 08/29/23 0857  NA 141   < > 141 143 142  K 5.4*   < > 4.4 4.6 4.0  CL 99   < > 104 100 98  CO2 26   < > 29 36* 31  GLUCOSE 90   < > 147* 64* 108*  BUN 27   < > 19 27* 29*  CREATININE 2.05*   < > 1.67* 2.00* 2.10*  CALCIUM  10.7*   < > 10.5* 11.2* 11.6*  11.6*  MG 1.9  --   --   --   --   TSH  --   --   --   --  1.96   < > = values in this interval not displayed.   Liver Function Tests: Recent Labs    05/24/23 0000  ALBUMIN 4.2   No results for input(s): "LIPASE", "AMYLASE" in the last 8760 hours. No results for input(s): "AMMONIA" in the last 8760 hours. CBC: Recent Labs    05/31/23 1446 07/03/23 1421 08/29/23 0857  WBC 7.8 7.9 9.7  NEUTROABS 5,819 5,127 6,829  HGB 14.7 15.3 15.9  HCT 46.1 48.0 49.5  MCV 89.7 88.6 89.2  PLT 185 199 195   Lipid Panel: No results for input(s): "CHOL", "HDL", "LDLCALC", "TRIG", "CHOLHDL", "LDLDIRECT" in the last 8760 hours. Lab Results  Component Value Date   HGBA1C 6.4 (H) 09/18/2023    Procedures since last visit: US  THYROID  Result Date: 11/26/2023 CLINICAL DATA:  Other. Hyperparathyroidism. 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 EXAM: THYROID  ULTRASOUND TECHNIQUE: Ultrasound examination of the thyroid  gland and adjacent soft tissues was performed. COMPARISON:  Nuclear medicine  sestamibi study 10/25/2023 FINDINGS: Parenchymal Echotexture: Moderately heterogenous Isthmus: 0.5 cm Right lobe: 3.1 x 1.3 x 1.1 cm Left lobe: 4.0 x 1.8 x 1.7 cm _________________________________________________________ Estimated total number of nodules >/= 1 cm: 0 Number of spongiform nodules >/=  2 cm not described below (TR1): 0 Number of mixed cystic and solid nodules >/= 1.5 cm not described below (TR2): 0 _________________________________________________________ Heterogeneous thyroid  gland.  No discrete thyroid  nodules. Posterior and just deep to the left thyroid  gland is a 1.5 x 1.4 x 1.2 cm hypoechoic solid nodule most consistent with a parathyroid  adenoma. IMPRESSION: 1. Heterogeneous thyroid  gland without evidence of discrete thyroid  nodule. 2. Small 1.5 cm hypoechoic solid nodule posterior to the lower pole of the left thyroid  gland consistent with the clinical suspicion of parathyroid  adenoma. Electronically Signed   By: Fernando Hoyer M.D.   On: 11/26/2023 09:28   ECHOCARDIOGRAM COMPLETE Result Date: 11/06/2023    ECHOCARDIOGRAM REPORT   Patient Name:   WEN GOICOECHEA Date of Exam: 11/06/2023 Medical Rec #:  [id number]     Height:       66.0 in Accession #:    [id number]    Weight:       226.0 lb Date of Birth:  [birth year]    BSA:          2.106 m Patient Age:    77 years      BP:           128/74 mmHg Patient Gender: M  HR:           62 bpm. Exam Location:  [address] Procedure: 2D Echo, Color Doppler and Cardiac Doppler (Both Spectral and Color            Flow Doppler were utilized during procedure). Indications:    Aortic Stenosis I35.0  History:        Patient has prior history of Echocardiogram examinations, most                 recent 11/16/2022. Arrythmias:Atrial Fibrillation; Risk                 Factors:Hypertension.  Sonographer:    Joleen Navy RDCS Referring Phys: [id number] CHRISTOPHER L SCHUMANN IMPRESSIONS  1. Left ventricular ejection fraction, by estimation, is 60 to  65%. The left ventricle has normal function. The left ventricle has no regional wall motion abnormalities. There is mild concentric left ventricular hypertrophy. Left ventricular diastolic parameters are consistent with Grade II diastolic dysfunction (pseudonormalization).  2. Right ventricular systolic function is normal. The right ventricular size is normal. There is normal pulmonary artery systolic pressure.  3. Left atrial size was mildly dilated.  4. The mitral valve is normal in structure. Trivial mitral valve regurgitation. No evidence of mitral stenosis.  5. The aortic valve is tricuspid. There is moderate calcification of the aortic valve. Aortic valve regurgitation is mild. Moderate to severe aortic valve stenosis. Aortic valve area, by VTI measures 0.94 cm. Aortic valve mean gradient measures 32.0 mmHg. Aortic valve Vmax measures 3.86 m/s.  6. Aortic dilatation noted. There is mild dilatation of the ascending aorta, measuring 41 mm.  7. The inferior vena cava is normal in size with greater than 50% respiratory variability, suggesting right atrial pressure of 3 mmHg. FINDINGS  Left Ventricle: Left ventricular ejection fraction, by estimation, is 60 to 65%. The left ventricle has normal function. The left ventricle has no regional wall motion abnormalities. The left ventricular internal cavity size was normal in size. There is  mild concentric left ventricular hypertrophy. Left ventricular diastolic parameters are consistent with Grade II diastolic dysfunction (pseudonormalization). Right Ventricle: The right ventricular size is normal. No increase in right ventricular wall thickness. Right ventricular systolic function is normal. There is normal pulmonary artery systolic pressure. The tricuspid regurgitant velocity is 2.37 m/s, and  with an assumed right atrial pressure of 3 mmHg, the estimated right ventricular systolic pressure is 25.5 mmHg. Left Atrium: Left atrial size was mildly dilated. Right  Atrium: Right atrial size was normal in size. Pericardium: There is no evidence of pericardial effusion. Mitral Valve: The mitral valve is normal in structure. Mild mitral annular calcification. Trivial mitral valve regurgitation. No evidence of mitral valve stenosis. Tricuspid Valve: The tricuspid valve is normal in structure. Tricuspid valve regurgitation is mild . No evidence of tricuspid stenosis. Aortic Valve: The aortic valve is tricuspid. There is moderate calcification of the aortic valve. Aortic valve regurgitation is mild. Moderate to severe aortic stenosis is present. Aortic valve mean gradient measures 32.0 mmHg. Aortic valve peak gradient  measures 59.6 mmHg. Aortic valve area, by VTI measures 0.94 cm. Pulmonic Valve: The pulmonic valve was normal in structure. Pulmonic valve regurgitation is trivial. No evidence of pulmonic stenosis. Aorta: Aortic dilatation noted. There is mild dilatation of the ascending aorta, measuring 41 mm. Venous: The inferior vena cava is normal in size with greater than 50% respiratory variability, suggesting right atrial pressure of 3 mmHg. IAS/Shunts: No atrial level shunt detected by  color flow Doppler.  LEFT VENTRICLE PLAX 2D LVIDd:         5.00 cm     Diastology LVIDs:         3.20 cm     LV e' medial:    4.73 cm/s LV PW:         1.20 cm     LV E/e' medial:  15.6 LV IVS:        1.30 cm     LV e' lateral:   6.66 cm/s LVOT diam:     2.00 cm     LV E/e' lateral: 11.1 LV SV:         66 LV SV Index:   31 LVOT Area:     3.14 cm  LV Volumes (MOD) LV vol d, MOD A2C: 49.8 ml LV vol d, MOD A4C: 53.3 ml LV vol s, MOD A2C: 22.4 ml LV vol s, MOD A4C: 24.3 ml LV SV MOD A2C:     27.4 ml LV SV MOD A4C:     53.3 ml LV SV MOD BP:      31.7 ml RIGHT VENTRICLE            IVC RV Basal diam:  3.70 cm    IVC diam: 1.70 cm RV Mid diam:    3.10 cm RV S prime:     8.15 cm/s TAPSE (M-mode): 1.8 cm LEFT ATRIUM             Index        RIGHT ATRIUM           Index LA diam:        4.40 cm 2.09 cm/m    RA Area:     13.70 cm LA Vol (A2C):   77.9 ml 36.98 ml/m  RA Volume:   28.60 ml  13.58 ml/m LA Vol (A4C):   47.1 ml 22.36 ml/m LA Biplane Vol: 63.0 ml 29.91 ml/m  AORTIC VALVE AV Area (Vmax):    0.85 cm AV Area (Vmean):   0.81 cm AV Area (VTI):     0.94 cm AV Vmax:           386.00 cm/s AV Vmean:          254.000 cm/s AV VTI:            0.705 m AV Peak Grad:      59.6 mmHg AV Mean Grad:      32.0 mmHg LVOT Vmax:         105.00 cm/s LVOT Vmean:        65.700 cm/s LVOT VTI:          0.211 m LVOT/AV VTI ratio: 0.30  AORTA Ao Root diam: 3.30 cm Ao Asc diam:  4.10 cm MITRAL VALVE               TRICUSPID VALVE MV Area (PHT): 3.53 cm    TR Peak grad:   22.5 mmHg MV Decel Time: 215 msec    TR Vmax:        237.00 cm/s MV E velocity: 73.90 cm/s MV A velocity: 53.00 cm/s  SHUNTS MV E/A ratio:  1.39        Systemic VTI:  0.21 m                            Systemic Diam: 2.00 cm Jules Oar MD Electronically signed by Jules Oar MD Signature  Date/Time: 11/06/2023/11:40:56 AM    Final     Assessment/Plan Assessment and Plan Assessment & Plan     ***   Labs/tests ordered:  * No order type specified *   No follow-ups on file.  Iyari Hagner Medina-Vargas, NP

## 2023-12-01 ENCOUNTER — Encounter: Payer: Self-pay | Admitting: Cardiology

## 2023-12-01 ENCOUNTER — Ambulatory Visit: Admitting: Cardiology

## 2023-12-01 ENCOUNTER — Ambulatory Visit: Attending: Cardiology | Admitting: Cardiology

## 2023-12-01 VITALS — BP 100/50 | HR 60 | Ht 65.0 in | Wt 223.6 lb

## 2023-12-01 DIAGNOSIS — I350 Nonrheumatic aortic (valve) stenosis: Secondary | ICD-10-CM | POA: Diagnosis not present

## 2023-12-01 DIAGNOSIS — I2510 Atherosclerotic heart disease of native coronary artery without angina pectoris: Secondary | ICD-10-CM

## 2023-12-01 DIAGNOSIS — I10 Essential (primary) hypertension: Secondary | ICD-10-CM

## 2023-12-01 DIAGNOSIS — I480 Paroxysmal atrial fibrillation: Secondary | ICD-10-CM | POA: Diagnosis not present

## 2023-12-01 DIAGNOSIS — I5032 Chronic diastolic (congestive) heart failure: Secondary | ICD-10-CM | POA: Diagnosis not present

## 2023-12-01 DIAGNOSIS — N1832 Chronic kidney disease, stage 3b: Secondary | ICD-10-CM

## 2023-12-01 LAB — LIPID PANEL

## 2023-12-01 NOTE — Telephone Encounter (Signed)
 Patient was seen in the clinic on 11/30/23. FYI.

## 2023-12-01 NOTE — Patient Instructions (Signed)
 Medication Instructions:  Continue same medications *If you need a refill on your cardiac medications before your next appointment, please call your pharmacy*  Lab Work: Lipid panel,bmet,magnesium,cbc today  Testing/Procedures: None ordered  Follow-Up: At [facility], you and your health needs are our priority.  As part of our continuing mission to provide you with exceptional heart care, our providers are all part of one team.  This team includes your primary Cardiologist (physician) and Advanced Practice Providers or APPs (Physician Assistants and Nurse Practitioners) who all work together to provide you with the care you need, when you need it.  Your next appointment:  6 months  Call in July to schedule Nov appointment     Provider:  Dr.Schumann       Schedule appointment with Dr.Croitoru for pacemaker check   Refer to T[facility]   someone from that dept will call with appointment     We recommend signing up for the patient portal called "MyChart".  Sign up information is provided on this After Visit Summary.  MyChart is used to connect with patients for Virtual Visits (Telemedicine).  Patients are able to view lab/test results, encounter notes, upcoming appointments, etc.  Non-urgent messages can be sent to your provider as well.   To learn more about what you can do with MyChart, go to [email or web address].

## 2023-12-02 LAB — CBC WITH DIFFERENTIAL/PLATELET
Basophils Absolute: 0.1 10*3/uL (ref 0.0–0.2)
Basos: 1 %
EOS (ABSOLUTE): 0.2 10*3/uL (ref 0.0–0.4)
Eos: 3 %
Hematocrit: 41.7 % (ref 37.5–51.0)
Hemoglobin: 13.6 g/dL (ref 13.0–17.7)
Immature Grans (Abs): 0 10*3/uL (ref 0.0–0.1)
Immature Granulocytes: 0 %
Lymphocytes Absolute: 1.3 10*3/uL (ref 0.7–3.1)
Lymphs: 16 %
MCH: 28.7 pg (ref 26.6–33.0)
MCHC: 32.6 g/dL (ref 31.5–35.7)
MCV: 88 fL (ref 79–97)
Monocytes Absolute: 0.8 10*3/uL (ref 0.1–0.9)
Monocytes: 10 %
Neutrophils Absolute: 5.9 10*3/uL (ref 1.4–7.0)
Neutrophils: 70 %
Platelets: 182 10*3/uL (ref 150–450)
RBC: 4.74 x10E6/uL (ref 4.14–5.80)
RDW: 17 % — ABNORMAL HIGH (ref 11.6–15.4)
WBC: 8.3 10*3/uL (ref 3.4–10.8)

## 2023-12-02 LAB — BASIC METABOLIC PANEL WITH GFR
BUN/Creatinine Ratio: 15 (ref 10–24)
BUN: 29 mg/dL — ABNORMAL HIGH (ref 8–27)
CO2: 20 mmol/L (ref 20–29)
Calcium: 10.1 mg/dL (ref 8.6–10.2)
Chloride: 105 mmol/L (ref 96–106)
Creatinine, Ser: 1.95 mg/dL — ABNORMAL HIGH (ref 0.76–1.27)
Glucose: 87 mg/dL (ref 70–99)
Potassium: 4.3 mmol/L (ref 3.5–5.2)
Sodium: 142 mmol/L (ref 134–144)
eGFR: 35 mL/min/{1.73_m2} — ABNORMAL LOW (ref >59)

## 2023-12-02 LAB — LIPID PANEL
Cholesterol, Total: 151 mg/dL (ref 100–199)
HDL: 50 mg/dL (ref >39)
LDL CALC COMMENT:: 3 ratio (ref 0.0–5.0)
LDL Chol Calc (NIH): 66 mg/dL (ref 0–99)
Triglycerides: 214 mg/dL — ABNORMAL HIGH (ref 0–149)
VLDL Cholesterol Cal: 35 mg/dL (ref 5–40)

## 2023-12-02 LAB — MAGNESIUM: Magnesium: 1.7 mg/dL (ref 1.6–2.3)

## 2023-12-03 NOTE — Progress Notes (Signed)
-     Lumbar and right shoulder x-ray showed osteoarthritis, referred to orthopedics

## 2023-12-04 NOTE — Addendum Note (Signed)
 Addended by: Sherryn Donalds on: 12/04/2023 11:03 AM   Modules accepted: Orders

## 2023-12-05 ENCOUNTER — Encounter: Admitting: Psychology

## 2023-12-05 ENCOUNTER — Telehealth: Payer: Self-pay | Admitting: Primary Care

## 2023-12-05 NOTE — Telephone Encounter (Signed)
 CMN received from [facility] fro Bipap and Cpap supplies.

## 2023-12-06 NOTE — Progress Notes (Signed)
-    right knee x-ray showed osteoarthritis, referral to ortho done.

## 2023-12-06 NOTE — Telephone Encounter (Signed)
 CMN faxed successfully and signed.

## 2023-12-06 NOTE — Progress Notes (Addendum)
 Patient ID: Johnny [name] MRN: [id number] DOB/AGE: 78-Sep-1947 78 y.o.  Primary Care Physician:Medina-Vargas, Sid Dragon, NP Primary Cardiologist: Alda Amas  CC:  Aortic valvular disease management     FOCUSED PROBLEM LIST:   AS AVA 0.94, MG 32, V-max 3.86, EF 60 to 65% TTE April 2025  CAD Previous PCI of right coronary artery and left Cx CABG 2015 LIMA to LAD and vein graft to OM LIMA to LAD patent (very atretic), vein graft to OM occluded, patent stents RCA and left circumflex cath 2024 PAF On Eliquis  Sick sinus syndrome Status post permanent pacemaker T2DM Diet controlled HL CKD stage IIIb BMI 37/BSA 2.01 Dec 2023:  Patient consents to use of AI scribe. The patient is a 77 year old male with above listed medical problems here for recommendations regarding his aortic valvular disease.  The patient has been followed by Dr. Truddie Furrow for some time.  He most recently reported exertional chest discomfort and dyspnea.  He has also had lightheadedness.  An echocardiogram demonstrated progression of his aortic stenosis.  Over the past three months, he has experienced worsening shortness of breath, now becoming breathless after walking short distances. Previously, he could walk comfortably around his home, but now faces significant limitations, impacting daily activities.  He experiences dizziness, particularly when laughing, but not significantly when transitioning from sitting to standing. He uses a cane due to an old knee injury and osteoporosis, which also affects his right shoulder. He has had multiple back surgeries and reports a recent fall that may have impacted his back further.  He lives alone, is a widower, and uses a scooter for grocery shopping due to mobility issues. He drives his landlady's car and has not played golf in four years due to stolen clubs and physical limitations. He wants to return to activities such as golf, karaoke, and fishing if his health improves.  He had  previously been playing golf a few years ago.  He reports dental issues, with teeth falling out and bleeding gums. He does not currently have a dentist.        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[place], [place]    Past Surgical History:  Procedure Laterality Date   BACK SURGERY  1975   Lower Back Surgery, Dr.Seltzer   BACK SURGERY  1985   Dr.Seltzer   LEFT HEART CATH AND CORS/GRAFTS ANGIOGRAPHY N/A 11/17/2022   Procedure: LEFT HEART CATH AND CORS/GRAFTS ANGIOGRAPHY;  Surgeon: Lucendia Rusk, MD;  Location: [facility];  Service: Cardiovascular;  Laterality: N/A;   SHOULDER SURGERY Left 1965   Dr.Seltzer   TONSILLECTOMY  1950   Dr.Perellie    Family History  Problem Relation Age of Onset   Cancer Father    Stroke Sister    Stroke Brother        x 2    Social History   Socioeconomic History   Marital status: Widowed    Spouse name: Not on file   Number of children: Not on file   Years of education: Not on file   Highest education level: 12th grade  Occupational History   Not on file  Tobacco Use   Smoking status: Former    Current packs/day: 1.00    Average packs/day: 1 pack/day for 10.0 years (10.0 ttl pk-yrs)    Types: Cigarettes  Smokeless tobacco: Never   Tobacco comments:    Quit at age 36  Vaping Use   Vaping status: Never Used  Substance and Sexual Activity   Alcohol use: Yes    Alcohol/week: 1.0 standard drink of alcohol    Types: 1 Glasses of wine per week    Comment: occasion   Drug use: Never   Sexual activity: Not on file  Other Topics Concern   Not on file  Social History Narrative   Tobacco use, amount per day now: None   Past tobacco use, amount per day: 1 pack   How many years did you use tobacco: 10 years.   Alcohol use (drinks per week): None   Diet:   Do you drink/eat things with caffeine: Yes    Marital status:  Widow                                What year were you married? 1972   Do you live in a house, apartment, assisted living, condo, trailer, etc.? House   Is it one or more stories? One   How many persons live in your home? One   Do you have pets in your home?( please list) No   Highest Level of education completed? 12th grade.   Current or past profession: Psychologist, occupational, Investment banker, operational, Clinical biochemist.    Do you exercise?   No                               Type and how often?   Do you have a living will? Yes   Do you have a DNR form?     No                              If not, do you want to discuss one?   Do you have signed POA/HPOA forms?  Yes                      If so, please bring to you appointment      Do you have any difficulty bathing or dressing yourself? No   Do you have any difficulty preparing food or eating? No   Do you have any difficulty managing your medications? No   Do you have any difficulty managing your finances? Yes   Do you have any difficulty affording your medications? No   Social Drivers of Corporate investment banker Strain: High Risk (07/02/2023)   Overall Financial Resource Strain (CARDIA)    Difficulty of Paying Living Expenses: Hard  Food Insecurity: Food Insecurity Present (07/02/2023)   Hunger Vital Sign    Worried About Running Out of Food in the Last Year: Sometimes true    Ran Out of Food in the Last Year: Sometimes true  Transportation Needs: Unmet Transportation Needs (07/02/2023)   PRAPARE - Administrator, Civil Service (Medical): Yes    Lack of Transportation (Non-Medical): No  Physical Activity: Unknown (07/02/2023)   Exercise Vital Sign    Days of Exercise per Week: 0 days    Minutes of Exercise per Session: Not on file  Stress: Stress Concern Present (07/02/2023)   [employer] of Occupational Health - Occupational Stress Questionnaire    Feeling of Stress : To some extent  Social Connections:  Moderately Isolated  (07/02/2023)   Social Connection and Isolation Panel [NHANES]    Frequency of Communication with Friends and Family: More than three times a week    Frequency of Social Gatherings with Friends and Family: Never    Attends Religious Services: More than 4 times per year    Active Member of [employer] or Organizations: No    Attends Banker Meetings: Not on file    Marital Status: Widowed  Intimate Partner Violence: Not At Risk (11/17/2022)   Humiliation, Afraid, Rape, and Kick questionnaire    Fear of Current or Ex-Partner: No    Emotionally Abused: No    Physically Abused: No    Sexually Abused: No     Prior to Admission medications   Medication Sig Start Date End Date Taking? Authorizing Provider  albuterol  (VENTOLIN  HFA) 108 (90 Base) MCG/ACT inhaler Inhale 1 puff into the lungs every 6 (six) hours as needed for wheezing or shortness of breath.    [provider]  amiodarone  (PACERONE ) 200 MG tablet TAKE 1 TABLET BY MOUTH DAILY 08/09/23   Medina-Vargas, Monina C, NP  carvedilol  (COREG ) 6.25 MG tablet TAKE 1 TABLET BY MOUTH TWICE  DAILY 01/23/23   Ngetich, Dinah C, NP  cyanocobalamin  (VITAMIN B12) 1000 MCG tablet Take 1 tablet (1,000 mcg total) by mouth daily. 11/05/22   Cassandra Cleveland, MD  ELIQUIS  5 MG TABS tablet TAKE 1 TABLET BY MOUTH TWICE  DAILY 11/14/22   Ngetich, Dinah C, NP  famotidine  (PEPCID ) 40 MG tablet Take 40 mg by mouth daily.    [provider]  fluticasone  (FLONASE ) 50 MCG/ACT nasal spray Place 2 sprays into both nostrils 3 (three) times daily as needed for allergies or rhinitis.    [provider]  furosemide  (LASIX ) 40 MG tablet TAKE 1 TABLET BY MOUTH EVERY  OTHER DAY 07/10/23   Medina-Vargas, Monina C, NP  glucose blood test strip 1 each by Other route daily. [facility] Glucometer 02/14/23   Ngetich, Dinah C, NP  isosorbide  mononitrate (IMDUR ) 30 MG 24 hr tablet TAKE 1 TABLET BY MOUTH DAILY 08/09/23   Medina-Vargas, Monina C, NP  loratadine   (CLARITIN ) 10 MG tablet Take 1 tablet (10 mg total) by mouth daily. 09/02/22   Ngetich, Dinah C, NP  mirtazapine  (REMERON  SOL-TAB) 30 MG disintegrating tablet Take 1 tablet (30 mg total) by mouth at bedtime. 04/19/23   Tye Gall, MD  montelukast  (SINGULAIR ) 10 MG tablet Take 10 mg by mouth daily as needed (for allergies).    [provider]  nitroGLYCERIN  (NITROSTAT ) 0.4 MG SL tablet DISSOLVE 1 TABLET UNDER THE  TONGUE EVERY 5 MINUTES AS NEEDED FOR CHEST PAIN. MAX OF 3 TABLETS IN 15 MINUTES. CALL 911 IF PAIN  PERSISTS. 08/09/23   Medina-Vargas, Monina C, NP  pantoprazole  (PROTONIX ) 40 MG tablet TAKE 1 TABLET BY MOUTH DAILY  BEFORE BREAKFAST 04/18/23   Ngetich, Dinah C, NP  ranolazine  (RANEXA ) 500 MG 12 hr tablet Take 1 tablet (500 mg total) by mouth 2 (two) times daily. 01/03/23   Flo Hummingbird, PA-C  rosuvastatin  (CRESTOR ) 40 MG tablet TAKE 1 TABLET BY MOUTH DAILY 08/09/23   Medina-Vargas, Monina C, NP  sodium chloride  (OCEAN) 0.65 % SOLN nasal spray Place 1 spray into both nostrils as needed for congestion. 09/02/22   Ngetich, Dinah C, NP  TYLENOL  8 HOUR ARTHRITIS PAIN 650 MG CR tablet Take 650-1,300 mg by mouth every 8 (eight) hours as needed for pain.  [provider]  venlafaxine  XR (EFFEXOR -XR) 150 MG 24 hr capsule TAKE 1 CAPSULE BY MOUTH DAILY  WITH BREAKFAST 06/20/23   Ngetich, Dinah C, NP    Allergies  Allergen Reactions   Acacia Itching   Metformin And Related Other (See Comments)    Metformin caused the patient's legs to become weak    REVIEW OF SYSTEMS:  General: no fevers/chills/night sweats Eyes: no blurry vision, diplopia, or amaurosis ENT: no sore throat or hearing loss Resp: no cough, wheezing, or hemoptysis CV: no edema or palpitations GI: no abdominal pain, nausea, vomiting, diarrhea, or constipation GU: no dysuria, frequency, or hematuria Skin: no rash Neuro: no headache, numbness, tingling, or weakness of extremities Musculoskeletal: no joint  pain or swelling Heme: no bleeding, DVT, or easy bruising Endo: no polydipsia or polyuria  BP 113/77   Pulse 60   Ht 5' 5 (1.651 m)   Wt 224 lb 9.6 oz (101.9 kg)   SpO2 93%   BMI 37.38 kg/m   PHYSICAL EXAM: GEN:  AO x 3 in no acute distress HEENT: normal Dentition: Poor Neck: JVP normal. +2 carotid upstrokes without bruits. No thyromegaly. Lungs: equal expansion, clear bilaterally CV: Apex is discrete and nondisplaced, RRR with 3 out of 6 systolic ejection murmur Abd: soft, non-tender, non-distended; no bruit; positive bowel sounds Ext: no edema, ecchymoses, or cyanosis Vascular: 2+ femoral pulses, 2+ radial pulses       Skin: warm and dry without rash Neuro: CN II-XII grossly intact; motor and sensory grossly intact    DATA AND STUDIES:  EKG: May 2025 EKG demonstrates atrial pacing with a right bundle branch block  EKG Interpretation Date/Time:    Ventricular Rate:    PR Interval:    QRS Duration:    QT Interval:    QTC Calculation:   R Axis:      Text Interpretation:          Cardiac Studies & Procedures   ______________________________________________________________________________________________ CARDIAC CATHETERIZATION  CARDIAC CATHETERIZATION 11/17/2022  Conclusion   Prox RCA to Mid RCA lesion is 10% stenosed. Patent stents.   SVG to OM graft is occluded.  Native proximal to mid circumflex is widely patent.   1st Mrg lesion is 25% stenosed.   Dist Cx lesion is 100% stenosed.  Right to left and left to left collaterals.   LIMA to LAD is small and fills slowly but appears to be patent.  Distal LAD is very small.   Previously placed Mid Cx to Dist Cx stent of unknown type is  widely patent.   LV end diastolic pressure is mildly elevated.   There is mild aortic valve stenosis.  Appears to have adequate circulation to all three coronary distributions.   Chronically occluded distal circumflex is not a candidate for CTO PCI.  No target for PCI.  Continue  aggressive medical therapy.  Results conveyed to the patient's daughter lives in [place] .  She is most concerned about his social situation.  She notes that he lives with a roommate who is not a family member and is not a caretaker.  He needs more support and she thinks he needs to be placed in some type of facility.  Apparently, he does not have a good relationship with his family who is in [place] .  She is hoping to speak to a Child psychotherapist to help with his social situation.  Apparently, the patient has been to several doctors trying to find something that can be fixed.  He was hoping he would either receive a stent or need his aortic valve replaced.  He was very disappointed at the results we found.  The sister was not surprised at his reaction.  She stated that this had happened several times in the past.  Findings Coronary Findings Diagnostic  Dominance: Right  Left Anterior Descending There is mild diffuse disease throughout the vessel.  Left Circumflex Collaterals Dist Cx filled by collaterals from 3rd RPL.  Previously placed Mid Cx to Dist Cx stent of unknown type is  widely patent. Dist Cx lesion is 100% stenosed. The lesion is chronically occluded.  First Obtuse Marginal Branch Vessel is large in size. The vessel exhibits minimal luminal irregularities. 1st Mrg lesion is 25% stenosed.  Third Obtuse Marginal Branch Collaterals 3rd Mrg filled by collaterals from 3rd RPL.  Right Coronary Artery Prox RCA to Mid RCA lesion is 10% stenosed. The lesion was previously treated .  LIMA Graft To Dist LAD And is small.  Sequential Graft To 1st Mrg, 3rd Mrg Origin to Prox Graft lesion before 1st Mrg  is 100% stenosed.  Intervention  No interventions have been documented.   STRESS TESTS  NM MYOCAR MULTI W/SPECT W 12/16/2021  Narrative CLINICAL DATA:  Chest pain. Prior CABG. Diastolic congestive heart failure. Abnormal cardiac enzymes.  EXAM: MYOCARDIAL IMAGING WITH  SPECT (REST AND PHARMACOLOGIC-STRESS)  GATED LEFT VENTRICULAR WALL MOTION STUDY  LEFT VENTRICULAR EJECTION FRACTION  TECHNIQUE: Standard myocardial SPECT imaging was performed after resting intravenous injection of 10.2 mCi Tc-67m tetrofosmin . Subsequently, intravenous infusion of Lexiscan  was performed under the supervision of the Cardiology staff. At peak effect of the drug, 31.0 mCi Tc-48m tetrofosmin  was injected intravenously and standard myocardial SPECT imaging was performed. Quantitative gated imaging was also performed to evaluate left ventricular wall motion, and estimate left ventricular ejection fraction.  COMPARISON:  None Available.  FINDINGS: Perfusion: No decreased activity in the left ventricle on stress imaging to suggest reversible ischemia or infarction. Decreased myocardial activity in the inferior wall seen predominantly on resting images is most likely due to diaphragmatic attenuation given lack of inferior wall motion abnormality.  Wall Motion: Normal left ventricular wall motion. No left ventricular dilation.  Left Ventricular Ejection Fraction: 58 %  End diastolic volume 88 ml  End systolic volume 37 ml  IMPRESSION: 1. Probable inferior wall diaphragmatic attenuation. No reversible ischemia or infarction.  2. Normal left ventricular wall motion.  3. Left ventricular ejection fraction 58%  4. Non invasive risk stratification*: Low  *2012 Appropriate Use Criteria for Coronary Revascularization Focused Update: J Am Coll Cardiol. 2012;59(9):857-881. http://content.dementiazones.com.aspx?articleid=1201161   Electronically Signed By: Marlyce Sine M.D. On: 12/16/2021 17:02   ECHOCARDIOGRAM  ECHOCARDIOGRAM COMPLETE 11/06/2023  Narrative ECHOCARDIOGRAM REPORT    Patient Name:   DIN BOOKWALTER Date of Exam: 11/06/2023 Medical Rec #:  [id number]     Height:       66.0 in Accession #:    [id number]    Weight:       226.0 lb Date of Birth:   July 12, 1946    BSA:          2.106 m Patient Age:    77 years      BP:           128/74 mmHg Patient Gender: M             HR:           62 bpm. Exam Location:  [address]  Procedure: 2D  Echo, Color Doppler and Cardiac Doppler (Both Spectral and Color Flow Doppler were utilized during procedure).  Indications:    Aortic Stenosis I35.0  History:        Patient has prior history of Echocardiogram examinations, most recent 11/16/2022. Arrythmias:Atrial Fibrillation; Risk Factors:Hypertension.  Sonographer:    Joleen Navy RDCS Referring Phys: [id number] CHRISTOPHER L SCHUMANN  IMPRESSIONS   1. Left ventricular ejection fraction, by estimation, is 60 to 65%. The left ventricle has normal function. The left ventricle has no regional wall motion abnormalities. There is mild concentric left ventricular hypertrophy. Left ventricular diastolic parameters are consistent with Grade II diastolic dysfunction (pseudonormalization). 2. Right ventricular systolic function is normal. The right ventricular size is normal. There is normal pulmonary artery systolic pressure. 3. Left atrial size was mildly dilated. 4. The mitral valve is normal in structure. Trivial mitral valve regurgitation. No evidence of mitral stenosis. 5. The aortic valve is tricuspid. There is moderate calcification of the aortic valve. Aortic valve regurgitation is mild. Moderate to severe aortic valve stenosis. Aortic valve area, by VTI measures 0.94 cm. Aortic valve mean gradient measures 32.0 mmHg. Aortic valve Vmax measures 3.86 m/s. 6. Aortic dilatation noted. There is mild dilatation of the ascending aorta, measuring 41 mm. 7. The inferior vena cava is normal in size with greater than 50% respiratory variability, suggesting right atrial pressure of 3 mmHg.  FINDINGS Left Ventricle: Left ventricular ejection fraction, by estimation, is 60 to 65%. The left ventricle has normal function. The left ventricle has no regional  wall motion abnormalities. The left ventricular internal cavity size was normal in size. There is mild concentric left ventricular hypertrophy. Left ventricular diastolic parameters are consistent with Grade II diastolic dysfunction (pseudonormalization).  Right Ventricle: The right ventricular size is normal. No increase in right ventricular wall thickness. Right ventricular systolic function is normal. There is normal pulmonary artery systolic pressure. The tricuspid regurgitant velocity is 2.37 m/s, and with an assumed right atrial pressure of 3 mmHg, the estimated right ventricular systolic pressure is 25.5 mmHg.  Left Atrium: Left atrial size was mildly dilated.  Right Atrium: Right atrial size was normal in size.  Pericardium: There is no evidence of pericardial effusion.  Mitral Valve: The mitral valve is normal in structure. Mild mitral annular calcification. Trivial mitral valve regurgitation. No evidence of mitral valve stenosis.  Tricuspid Valve: The tricuspid valve is normal in structure. Tricuspid valve regurgitation is mild . No evidence of tricuspid stenosis.  Aortic Valve: The aortic valve is tricuspid. There is moderate calcification of the aortic valve. Aortic valve regurgitation is mild. Moderate to severe aortic stenosis is present. Aortic valve mean gradient measures 32.0 mmHg. Aortic valve peak gradient measures 59.6 mmHg. Aortic valve area, by VTI measures 0.94 cm.  Pulmonic Valve: The pulmonic valve was normal in structure. Pulmonic valve regurgitation is trivial. No evidence of pulmonic stenosis.  Aorta: Aortic dilatation noted. There is mild dilatation of the ascending aorta, measuring 41 mm.  Venous: The inferior vena cava is normal in size with greater than 50% respiratory variability, suggesting right atrial pressure of 3 mmHg.  IAS/Shunts: No atrial level shunt detected by color flow Doppler.   LEFT VENTRICLE PLAX 2D LVIDd:         5.00 cm      Diastology LVIDs:         3.20 cm     LV e' medial:    4.73 cm/s LV PW:  1.20 cm     LV E/e' medial:  15.6 LV IVS:        1.30 cm     LV e' lateral:   6.66 cm/s LVOT diam:     2.00 cm     LV E/e' lateral: 11.1 LV SV:         66 LV SV Index:   31 LVOT Area:     3.14 cm  LV Volumes (MOD) LV vol d, MOD A2C: 49.8 ml LV vol d, MOD A4C: 53.3 ml LV vol s, MOD A2C: 22.4 ml LV vol s, MOD A4C: 24.3 ml LV SV MOD A2C:     27.4 ml LV SV MOD A4C:     53.3 ml LV SV MOD BP:      31.7 ml  RIGHT VENTRICLE            IVC RV Basal diam:  3.70 cm    IVC diam: 1.70 cm RV Mid diam:    3.10 cm RV S prime:     8.15 cm/s TAPSE (M-mode): 1.8 cm  LEFT ATRIUM             Index        RIGHT ATRIUM           Index LA diam:        4.40 cm 2.09 cm/m   RA Area:     13.70 cm LA Vol (A2C):   77.9 ml 36.98 ml/m  RA Volume:   28.60 ml  13.58 ml/m LA Vol (A4C):   47.1 ml 22.36 ml/m LA Biplane Vol: 63.0 ml 29.91 ml/m AORTIC VALVE AV Area (Vmax):    0.85 cm AV Area (Vmean):   0.81 cm AV Area (VTI):     0.94 cm AV Vmax:           386.00 cm/s AV Vmean:          254.000 cm/s AV VTI:            0.705 m AV Peak Grad:      59.6 mmHg AV Mean Grad:      32.0 mmHg LVOT Vmax:         105.00 cm/s LVOT Vmean:        65.700 cm/s LVOT VTI:          0.211 m LVOT/AV VTI ratio: 0.30  AORTA Ao Root diam: 3.30 cm Ao Asc diam:  4.10 cm  MITRAL VALVE               TRICUSPID VALVE MV Area (PHT): 3.53 cm    TR Peak grad:   22.5 mmHg MV Decel Time: 215 msec    TR Vmax:        237.00 cm/s MV E velocity: 73.90 cm/s MV A velocity: 53.00 cm/s  SHUNTS MV E/A ratio:  1.39        Systemic VTI:  0.21 m Systemic Diam: 2.00 cm  Jules Oar MD Electronically signed by Jules Oar MD Signature Date/Time: 11/06/2023/11:40:56 AM    Final          ______________________________________________________________________________________________      08/29/2023: TSH 1.96 12/01/2023: BUN 29; Creatinine, Ser  1.95; Hemoglobin 13.6; Magnesium 1.7; Platelets 182; Potassium 4.3; Sodium 142   STS RISK CALCULATOR: Pending  NHYA CLASS: 2  STS score  Procedure Type: Isolated AVR Perioperative Outcome Estimate % Operative Mortality 9.62% Morbidity & Mortality 25.5% Stroke 2.21% Renal Failure 10.9% Reoperation 5.3% Prolonged Ventilation 20.4% Deep  Sternal Wound Infection 0.27% Long Hospital Stay (>14 days) 14.8% Short Hospital Stay (<6 days)* 17.2%   ________________________   ASSESSMENT AND PLAN:   1. Nonrheumatic aortic valve stenosis   2. Hx of CABG   3. Paroxysmal atrial fibrillation (HCC)   4. Secondary hypercoagulable state (HCC)   5. Type 2 diabetes mellitus with complication, without long-term current use of insulin  (HCC)   6. Hypertension associated with diabetes (HCC)   7. Hyperlipidemia associated with type 2 diabetes mellitus (HCC)   8. CKD stage 3 due to type 2 diabetes mellitus (HCC)   9. BMI 37.0-37.9, adult     Aortic stenosis: The patient has developed NYHA class II symptoms attributable to his moderate to severe aortic stenosis.  I will refer the patient for cardiac catheterization, right heart catheterization, CTA, and cardiothoracic surgical evaluation.  The patient had previously been quite active and was playing golf a few years ago.  He would very much like to get back to this.  He has poor dental health so we will refer him for dental evaluation. CAD with history of CABG: Continue Eliquis  5 mg twice daily, Imdur  30 mg daily, rosuvastatin  40 mg daily, and as needed nitroglycerin . Paroxysmal atrial fibrillation: Continue amiodarone  200 mg daily, Coreg  6.25 mg twice daily, Eliquis  5 mg twice daily. Secondary hypercoagulable state: Continue Eliquis  5 mg twice daily. T2DM: Continue Eliquis  5 mg twice daily, rosuvastatin  40 mg daily, and consider SGLT2 inhibitor in the future. Hypertension: Continue Coreg  6.25 mg twice daily, consider ARB in the future.   Hyperlipidemia:  Continue rosuvastatin  40 mg daily. CKD stage IIIb: Consider SGLT2 inhibitor and ARB for renal protection in the future. Elevated BMI: Consider GLP-1 receptor agonist given history of diabetes in the future.  I have personally reviewed the patients imaging data as summarized above.  I have reviewed the natural history of aortic stenosis with the patient and family members who are present today. We have discussed the limitations of medical therapy and the poor prognosis associated with symptomatic aortic stenosis. We have also reviewed potential treatment options, including palliative medical therapy, conventional surgical aortic valve replacement, and transcatheter aortic valve replacement. We discussed treatment options in the context of this patient's specific comorbid medical conditions.   All of the patient's questions were answered today. Will make further recommendations based on the results of studies outlined above.   I spent 45 minutes reviewing all clinical data during and prior to this visit including all relevant imaging studies, laboratories, clinical information from other health systems and prior notes from both Cardiology and other specialties, interviewing the patient, conducting a complete physical examination, and coordinating care in order to formulate a comprehensive and personalized evaluation and treatment plan.   Nabiha Planck K Kyley Laurel, MD  12/07/2023 10:34 AM    [facility] [address] [place], [place], [place]  [address] Phone: [phone]; Fax: [phone]

## 2023-12-06 NOTE — H&P (View-Only) (Signed)
 Patient ID: Johnny [name] MRN: [id number] DOB/AGE: 78/09/1945 78 y.o.  Primary Care Physician:Medina-Vargas, Sid Dragon, NP Primary Cardiologist: Alda Amas  CC:  Aortic valvular disease management     FOCUSED PROBLEM LIST:   AS AVA 0.94, MG 32, V-max 3.86, EF 60 to 65% TTE April 2025  CAD Previous PCI of right coronary artery and left Cx CABG 2015 LIMA to LAD and vein graft to OM LIMA to LAD patent (very atretic), vein graft to OM occluded, patent stents RCA and left circumflex cath 2024 PAF On Eliquis  Sick sinus syndrome Status post permanent pacemaker T2DM Diet controlled HL CKD stage IIIb BMI 37/BSA 2.01 Dec 2023:  Patient consents to use of AI scribe. The patient is a 78 year old male with above listed medical problems here for recommendations regarding his aortic valvular disease.  The patient has been followed by Dr. Truddie Furrow for some time.  He most recently reported exertional chest discomfort and dyspnea.  He has also had lightheadedness.  An echocardiogram demonstrated progression of his aortic stenosis.  Over the past three months, he has experienced worsening shortness of breath, now becoming breathless after walking short distances. Previously, he could walk comfortably around his home, but now faces significant limitations, impacting daily activities.  He experiences dizziness, particularly when laughing, but not significantly when transitioning from sitting to standing. He uses a cane due to an old knee injury and osteoporosis, which also affects his right shoulder. He has had multiple back surgeries and reports a recent fall that may have impacted his back further.  He lives alone, is a widower, and uses a scooter for grocery shopping due to mobility issues. He drives his landlady's car and has not played golf in four years due to stolen clubs and physical limitations. He wants to return to activities such as golf, karaoke, and fishing if his health improves.  He had  previously been playing golf a few years ago.  He reports dental issues, with teeth falling out and bleeding gums. He does not currently have a dentist.        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[place], [place]    Past Surgical History:  Procedure Laterality Date   BACK SURGERY  1975   Lower Back Surgery, Dr.Seltzer   BACK SURGERY  1985   Dr.Seltzer   LEFT HEART CATH AND CORS/GRAFTS ANGIOGRAPHY N/A 11/17/2022   Procedure: LEFT HEART CATH AND CORS/GRAFTS ANGIOGRAPHY;  Surgeon: Lucendia Rusk, MD;  Location: [facility];  Service: Cardiovascular;  Laterality: N/A;   SHOULDER SURGERY Left 1965   Dr.Seltzer   TONSILLECTOMY  1950   Dr.Perellie    Family History  Problem Relation Age of Onset   Cancer Father    Stroke Sister    Stroke Brother        x 2    Social History   Socioeconomic History   Marital status: Widowed    Spouse name: Not on file   Number of children: Not on file   Years of education: Not on file   Highest education level: 12th grade  Occupational History   Not on file  Tobacco Use   Smoking status: Former    Current packs/day: 1.00    Average packs/day: 1 pack/day for 10.0 years (10.0 ttl pk-yrs)    Types: Cigarettes  Smokeless tobacco: Never   Tobacco comments:    Quit at age 75  Vaping Use   Vaping status: Never Used  Substance and Sexual Activity   Alcohol use: Yes    Alcohol/week: 1.0 standard drink of alcohol    Types: 1 Glasses of wine per week    Comment: occasion   Drug use: Never   Sexual activity: Not on file  Other Topics Concern   Not on file  Social History Narrative   Tobacco use, amount per day now: None   Past tobacco use, amount per day: 1 pack   How many years did you use tobacco: 10 years.   Alcohol use (drinks per week): None   Diet:   Do you drink/eat things with caffeine: Yes    Marital status:  Widow                                What year were you married? 1972   Do you live in a house, apartment, assisted living, condo, trailer, etc.? House   Is it one or more stories? One   How many persons live in your home? One   Do you have pets in your home?( please list) No   Highest Level of education completed? 12th grade.   Current or past profession: Psychologist, occupational, Investment banker, operational, Clinical biochemist.    Do you exercise?   No                               Type and how often?   Do you have a living will? Yes   Do you have a DNR form?     No                              If not, do you want to discuss one?   Do you have signed POA/HPOA forms?  Yes                      If so, please bring to you appointment      Do you have any difficulty bathing or dressing yourself? No   Do you have any difficulty preparing food or eating? No   Do you have any difficulty managing your medications? No   Do you have any difficulty managing your finances? Yes   Do you have any difficulty affording your medications? No   Social Drivers of Corporate investment banker Strain: High Risk (07/02/2023)   Overall Financial Resource Strain (CARDIA)    Difficulty of Paying Living Expenses: Hard  Food Insecurity: Food Insecurity Present (07/02/2023)   Hunger Vital Sign    Worried About Running Out of Food in the Last Year: Sometimes true    Ran Out of Food in the Last Year: Sometimes true  Transportation Needs: Unmet Transportation Needs (07/02/2023)   PRAPARE - Administrator, Civil Service (Medical): Yes    Lack of Transportation (Non-Medical): No  Physical Activity: Unknown (07/02/2023)   Exercise Vital Sign    Days of Exercise per Week: 0 days    Minutes of Exercise per Session: Not on file  Stress: Stress Concern Present (07/02/2023)   [employer] of Occupational Health - Occupational Stress Questionnaire    Feeling of Stress : To some extent  Social Connections:  Moderately Isolated  (07/02/2023)   Social Connection and Isolation Panel [NHANES]    Frequency of Communication with Friends and Family: More than three times a week    Frequency of Social Gatherings with Friends and Family: Never    Attends Religious Services: More than 4 times per year    Active Member of [employer] or Organizations: No    Attends Banker Meetings: Not on file    Marital Status: Widowed  Intimate Partner Violence: Not At Risk (11/17/2022)   Humiliation, Afraid, Rape, and Kick questionnaire    Fear of Current or Ex-Partner: No    Emotionally Abused: No    Physically Abused: No    Sexually Abused: No     Prior to Admission medications   Medication Sig Start Date End Date Taking? Authorizing Provider  albuterol  (VENTOLIN  HFA) 108 (90 Base) MCG/ACT inhaler Inhale 1 puff into the lungs every 6 (six) hours as needed for wheezing or shortness of breath.    [provider]  amiodarone  (PACERONE ) 200 MG tablet TAKE 1 TABLET BY MOUTH DAILY 08/09/23   Medina-Vargas, Monina C, NP  carvedilol  (COREG ) 6.25 MG tablet TAKE 1 TABLET BY MOUTH TWICE  DAILY 01/23/23   Ngetich, Dinah C, NP  cyanocobalamin  (VITAMIN B12) 1000 MCG tablet Take 1 tablet (1,000 mcg total) by mouth daily. 11/05/22   Cassandra Cleveland, MD  ELIQUIS  5 MG TABS tablet TAKE 1 TABLET BY MOUTH TWICE  DAILY 11/14/22   Ngetich, Dinah C, NP  famotidine  (PEPCID ) 40 MG tablet Take 40 mg by mouth daily.    [provider]  fluticasone  (FLONASE ) 50 MCG/ACT nasal spray Place 2 sprays into both nostrils 3 (three) times daily as needed for allergies or rhinitis.    [provider]  furosemide  (LASIX ) 40 MG tablet TAKE 1 TABLET BY MOUTH EVERY  OTHER DAY 07/10/23   Medina-Vargas, Monina C, NP  glucose blood test strip 1 each by Other route daily. [facility] Glucometer 02/14/23   Ngetich, Dinah C, NP  isosorbide  mononitrate (IMDUR ) 30 MG 24 hr tablet TAKE 1 TABLET BY MOUTH DAILY 08/09/23   Medina-Vargas, Monina C, NP  loratadine   (CLARITIN ) 10 MG tablet Take 1 tablet (10 mg total) by mouth daily. 09/02/22   Ngetich, Dinah C, NP  mirtazapine  (REMERON  SOL-TAB) 30 MG disintegrating tablet Take 1 tablet (30 mg total) by mouth at bedtime. 04/19/23   Tye Gall, MD  montelukast  (SINGULAIR ) 10 MG tablet Take 10 mg by mouth daily as needed (for allergies).    [provider]  nitroGLYCERIN  (NITROSTAT ) 0.4 MG SL tablet DISSOLVE 1 TABLET UNDER THE  TONGUE EVERY 5 MINUTES AS NEEDED FOR CHEST PAIN. MAX OF 3 TABLETS IN 15 MINUTES. CALL 911 IF PAIN  PERSISTS. 08/09/23   Medina-Vargas, Monina C, NP  pantoprazole  (PROTONIX ) 40 MG tablet TAKE 1 TABLET BY MOUTH DAILY  BEFORE BREAKFAST 04/18/23   Ngetich, Dinah C, NP  ranolazine  (RANEXA ) 500 MG 12 hr tablet Take 1 tablet (500 mg total) by mouth 2 (two) times daily. 01/03/23   Flo Hummingbird, PA-C  rosuvastatin  (CRESTOR ) 40 MG tablet TAKE 1 TABLET BY MOUTH DAILY 08/09/23   Medina-Vargas, Monina C, NP  sodium chloride  (OCEAN) 0.65 % SOLN nasal spray Place 1 spray into both nostrils as needed for congestion. 09/02/22   Ngetich, Dinah C, NP  TYLENOL  8 HOUR ARTHRITIS PAIN 650 MG CR tablet Take 650-1,300 mg by mouth every 8 (eight) hours as needed for pain.  [provider]  venlafaxine  XR (EFFEXOR -XR) 150 MG 24 hr capsule TAKE 1 CAPSULE BY MOUTH DAILY  WITH BREAKFAST 06/20/23   Ngetich, Dinah C, NP    Allergies  Allergen Reactions   Acacia Itching   Metformin And Related Other (See Comments)    Metformin caused the patient's legs to become weak    REVIEW OF SYSTEMS:  General: no fevers/chills/night sweats Eyes: no blurry vision, diplopia, or amaurosis ENT: no sore throat or hearing loss Resp: no cough, wheezing, or hemoptysis CV: no edema or palpitations GI: no abdominal pain, nausea, vomiting, diarrhea, or constipation GU: no dysuria, frequency, or hematuria Skin: no rash Neuro: no headache, numbness, tingling, or weakness of extremities Musculoskeletal: no joint  pain or swelling Heme: no bleeding, DVT, or easy bruising Endo: no polydipsia or polyuria  BP 113/77   Pulse 60   Ht 5\' 5"  (1.651 m)   Wt 224 lb 9.6 oz (101.9 kg)   SpO2 93%   BMI 37.38 kg/m   PHYSICAL EXAM: GEN:  AO x 3 in no acute distress HEENT: normal Dentition: Poor Neck: JVP normal. +2 carotid upstrokes without bruits. No thyromegaly. Lungs: equal expansion, clear bilaterally CV: Apex is discrete and nondisplaced, RRR with 3 out of 6 systolic ejection murmur Abd: soft, non-tender, non-distended; no bruit; positive bowel sounds Ext: no edema, ecchymoses, or cyanosis Vascular: 2+ femoral pulses, 2+ radial pulses       Skin: warm and dry without rash Neuro: CN II-XII grossly intact; motor and sensory grossly intact    DATA AND STUDIES:  EKG: May 2025 EKG demonstrates atrial pacing with a right bundle branch block  EKG Interpretation Date/Time:    Ventricular Rate:    PR Interval:    QRS Duration:    QT Interval:    QTC Calculation:   R Axis:      Text Interpretation:          Cardiac Studies & Procedures   ______________________________________________________________________________________________ CARDIAC CATHETERIZATION  CARDIAC CATHETERIZATION 11/17/2022  Conclusion   Prox RCA to Mid RCA lesion is 10% stenosed. Patent stents.   SVG to OM graft is occluded.  Native proximal to mid circumflex is widely patent.   1st Mrg lesion is 25% stenosed.   Dist Cx lesion is 100% stenosed.  Right to left and left to left collaterals.   LIMA to LAD is small and fills slowly but appears to be patent.  Distal LAD is very small.   Previously placed Mid Cx to Dist Cx stent of unknown type is  widely patent.   LV end diastolic pressure is mildly elevated.   There is mild aortic valve stenosis.  Appears to have adequate circulation to all three coronary distributions.   Chronically occluded distal circumflex is not a candidate for CTO PCI.  No target for PCI.  Continue  aggressive medical therapy.  Results conveyed to the patient's daughter lives in [place] .  She is most concerned about his social situation.  She notes that he lives with a roommate who is not a family member and is not a caretaker.  He needs more support and she thinks he needs to be placed in some type of facility.  Apparently, he does not have a good relationship with his family who is in [place] .  She is hoping to speak to a Child psychotherapist to help with his social situation.  Apparently, the patient has been to several doctors trying to find something that can be fixed.  He was hoping he would either receive a stent or need his aortic valve replaced.  He was very disappointed at the results we found.  The sister was not surprised at his reaction.  She stated that this had happened several times in the past.  Findings Coronary Findings Diagnostic  Dominance: Right  Left Anterior Descending There is mild diffuse disease throughout the vessel.  Left Circumflex Collaterals Dist Cx filled by collaterals from 3rd RPL.  Previously placed Mid Cx to Dist Cx stent of unknown type is  widely patent. Dist Cx lesion is 100% stenosed. The lesion is chronically occluded.  First Obtuse Marginal Branch Vessel is large in size. The vessel exhibits minimal luminal irregularities. 1st Mrg lesion is 25% stenosed.  Third Obtuse Marginal Branch Collaterals 3rd Mrg filled by collaterals from 3rd RPL.  Right Coronary Artery Prox RCA to Mid RCA lesion is 10% stenosed. The lesion was previously treated .  LIMA Graft To Dist LAD And is small.  Sequential Graft To 1st Mrg, 3rd Mrg Origin to Prox Graft lesion before 1st Mrg  is 100% stenosed.  Intervention  No interventions have been documented.   STRESS TESTS  NM MYOCAR MULTI W/SPECT W 12/16/2021  Narrative CLINICAL DATA:  Chest pain. Prior CABG. Diastolic congestive heart failure. Abnormal cardiac enzymes.  EXAM: MYOCARDIAL IMAGING WITH  SPECT (REST AND PHARMACOLOGIC-STRESS)  GATED LEFT VENTRICULAR WALL MOTION STUDY  LEFT VENTRICULAR EJECTION FRACTION  TECHNIQUE: Standard myocardial SPECT imaging was performed after resting intravenous injection of 10.2 mCi Tc-39m tetrofosmin . Subsequently, intravenous infusion of Lexiscan  was performed under the supervision of the Cardiology staff. At peak effect of the drug, 31.0 mCi Tc-48m tetrofosmin  was injected intravenously and standard myocardial SPECT imaging was performed. Quantitative gated imaging was also performed to evaluate left ventricular wall motion, and estimate left ventricular ejection fraction.  COMPARISON:  None Available.  FINDINGS: Perfusion: No decreased activity in the left ventricle on stress imaging to suggest reversible ischemia or infarction. Decreased myocardial activity in the inferior wall seen predominantly on resting images is most likely due to diaphragmatic attenuation given lack of inferior wall motion abnormality.  Wall Motion: Normal left ventricular wall motion. No left ventricular dilation.  Left Ventricular Ejection Fraction: 58 %  End diastolic volume 88 ml  End systolic volume 37 ml  IMPRESSION: 1. Probable inferior wall diaphragmatic attenuation. No reversible ischemia or infarction.  2. Normal left ventricular wall motion.  3. Left ventricular ejection fraction 58%  4. Non invasive risk stratification*: Low  *2012 Appropriate Use Criteria for Coronary Revascularization Focused Update: J Am Coll Cardiol. 2012;59(9):857-881. http://content.dementiazones.com.aspx?articleid=1201161   Electronically Signed By: Marlyce Sine M.D. On: 12/16/2021 17:02   ECHOCARDIOGRAM  ECHOCARDIOGRAM COMPLETE 11/06/2023  Narrative ECHOCARDIOGRAM REPORT    Patient Name:   EARL LOSEE Date of Exam: 11/06/2023 Medical Rec #:  [id number]     Height:       66.0 in Accession #:    [id number]    Weight:       226.0 lb Date of Birth:   01/23/1946    BSA:          2.106 m Patient Age:    77 years      BP:           128/74 mmHg Patient Gender: M             HR:           62 bpm. Exam Location:  [address]  Procedure: 2D  Echo, Color Doppler and Cardiac Doppler (Both Spectral and Color Flow Doppler were utilized during procedure).  Indications:    Aortic Stenosis I35.0  History:        Patient has prior history of Echocardiogram examinations, most recent 11/16/2022. Arrythmias:Atrial Fibrillation; Risk Factors:Hypertension.  Sonographer:    Joleen Navy RDCS Referring Phys: [id number] CHRISTOPHER L SCHUMANN  IMPRESSIONS   1. Left ventricular ejection fraction, by estimation, is 60 to 65%. The left ventricle has normal function. The left ventricle has no regional wall motion abnormalities. There is mild concentric left ventricular hypertrophy. Left ventricular diastolic parameters are consistent with Grade II diastolic dysfunction (pseudonormalization). 2. Right ventricular systolic function is normal. The right ventricular size is normal. There is normal pulmonary artery systolic pressure. 3. Left atrial size was mildly dilated. 4. The mitral valve is normal in structure. Trivial mitral valve regurgitation. No evidence of mitral stenosis. 5. The aortic valve is tricuspid. There is moderate calcification of the aortic valve. Aortic valve regurgitation is mild. Moderate to severe aortic valve stenosis. Aortic valve area, by VTI measures 0.94 cm. Aortic valve mean gradient measures 32.0 mmHg. Aortic valve Vmax measures 3.86 m/s. 6. Aortic dilatation noted. There is mild dilatation of the ascending aorta, measuring 41 mm. 7. The inferior vena cava is normal in size with greater than 50% respiratory variability, suggesting right atrial pressure of 3 mmHg.  FINDINGS Left Ventricle: Left ventricular ejection fraction, by estimation, is 60 to 65%. The left ventricle has normal function. The left ventricle has no regional  wall motion abnormalities. The left ventricular internal cavity size was normal in size. There is mild concentric left ventricular hypertrophy. Left ventricular diastolic parameters are consistent with Grade II diastolic dysfunction (pseudonormalization).  Right Ventricle: The right ventricular size is normal. No increase in right ventricular wall thickness. Right ventricular systolic function is normal. There is normal pulmonary artery systolic pressure. The tricuspid regurgitant velocity is 2.37 m/s, and with an assumed right atrial pressure of 3 mmHg, the estimated right ventricular systolic pressure is 25.5 mmHg.  Left Atrium: Left atrial size was mildly dilated.  Right Atrium: Right atrial size was normal in size.  Pericardium: There is no evidence of pericardial effusion.  Mitral Valve: The mitral valve is normal in structure. Mild mitral annular calcification. Trivial mitral valve regurgitation. No evidence of mitral valve stenosis.  Tricuspid Valve: The tricuspid valve is normal in structure. Tricuspid valve regurgitation is mild . No evidence of tricuspid stenosis.  Aortic Valve: The aortic valve is tricuspid. There is moderate calcification of the aortic valve. Aortic valve regurgitation is mild. Moderate to severe aortic stenosis is present. Aortic valve mean gradient measures 32.0 mmHg. Aortic valve peak gradient measures 59.6 mmHg. Aortic valve area, by VTI measures 0.94 cm.  Pulmonic Valve: The pulmonic valve was normal in structure. Pulmonic valve regurgitation is trivial. No evidence of pulmonic stenosis.  Aorta: Aortic dilatation noted. There is mild dilatation of the ascending aorta, measuring 41 mm.  Venous: The inferior vena cava is normal in size with greater than 50% respiratory variability, suggesting right atrial pressure of 3 mmHg.  IAS/Shunts: No atrial level shunt detected by color flow Doppler.   LEFT VENTRICLE PLAX 2D LVIDd:         5.00 cm      Diastology LVIDs:         3.20 cm     LV e' medial:    4.73 cm/s LV PW:  1.20 cm     LV E/e' medial:  15.6 LV IVS:        1.30 cm     LV e' lateral:   6.66 cm/s LVOT diam:     2.00 cm     LV E/e' lateral: 11.1 LV SV:         66 LV SV Index:   31 LVOT Area:     3.14 cm  LV Volumes (MOD) LV vol d, MOD A2C: 49.8 ml LV vol d, MOD A4C: 53.3 ml LV vol s, MOD A2C: 22.4 ml LV vol s, MOD A4C: 24.3 ml LV SV MOD A2C:     27.4 ml LV SV MOD A4C:     53.3 ml LV SV MOD BP:      31.7 ml  RIGHT VENTRICLE            IVC RV Basal diam:  3.70 cm    IVC diam: 1.70 cm RV Mid diam:    3.10 cm RV S prime:     8.15 cm/s TAPSE (M-mode): 1.8 cm  LEFT ATRIUM             Index        RIGHT ATRIUM           Index LA diam:        4.40 cm 2.09 cm/m   RA Area:     13.70 cm LA Vol (A2C):   77.9 ml 36.98 ml/m  RA Volume:   28.60 ml  13.58 ml/m LA Vol (A4C):   47.1 ml 22.36 ml/m LA Biplane Vol: 63.0 ml 29.91 ml/m AORTIC VALVE AV Area (Vmax):    0.85 cm AV Area (Vmean):   0.81 cm AV Area (VTI):     0.94 cm AV Vmax:           386.00 cm/s AV Vmean:          254.000 cm/s AV VTI:            0.705 m AV Peak Grad:      59.6 mmHg AV Mean Grad:      32.0 mmHg LVOT Vmax:         105.00 cm/s LVOT Vmean:        65.700 cm/s LVOT VTI:          0.211 m LVOT/AV VTI ratio: 0.30  AORTA Ao Root diam: 3.30 cm Ao Asc diam:  4.10 cm  MITRAL VALVE               TRICUSPID VALVE MV Area (PHT): 3.53 cm    TR Peak grad:   22.5 mmHg MV Decel Time: 215 msec    TR Vmax:        237.00 cm/s MV E velocity: 73.90 cm/s MV A velocity: 53.00 cm/s  SHUNTS MV E/A ratio:  1.39        Systemic VTI:  0.21 m Systemic Diam: 2.00 cm  Jules Oar MD Electronically signed by Jules Oar MD Signature Date/Time: 11/06/2023/11:40:56 AM    Final          ______________________________________________________________________________________________      08/29/2023: TSH 1.96 12/01/2023: BUN 29; Creatinine, Ser  1.95; Hemoglobin 13.6; Magnesium 1.7; Platelets 182; Potassium 4.3; Sodium 142   STS RISK CALCULATOR: Pending  NHYA CLASS: 2    ASSESSMENT AND PLAN:   1. Nonrheumatic aortic valve stenosis   2. Hx of CABG   3. Paroxysmal atrial fibrillation (HCC)   4. Secondary  hypercoagulable state (HCC)   5. Type 2 diabetes mellitus with complication, without long-term current use of insulin  (HCC)   6. Hypertension associated with diabetes (HCC)   7. Hyperlipidemia associated with type 2 diabetes mellitus (HCC)   8. CKD stage 3 due to type 2 diabetes mellitus (HCC)   9. BMI 37.0-37.9, adult     Aortic stenosis: The patient has developed NYHA class II symptoms attributable to his moderate to severe aortic stenosis.  I will refer the patient for cardiac catheterization, right heart catheterization, CTA, and cardiothoracic surgical evaluation.  The patient had previously been quite active and was playing golf a few years ago.  He would very much like to get back to this.  He has poor dental health so we will refer him for dental evaluation. CAD with history of CABG: Continue Eliquis  5 mg twice daily, Imdur  30 mg daily, rosuvastatin  40 mg daily, and as needed nitroglycerin . Paroxysmal atrial fibrillation: Continue amiodarone  200 mg daily, Coreg  6.25 mg twice daily, Eliquis  5 mg twice daily. Secondary hypercoagulable state: Continue Eliquis  5 mg twice daily. T2DM: Continue Eliquis  5 mg twice daily, rosuvastatin  40 mg daily, and consider SGLT2 inhibitor in the future. Hypertension: Continue Coreg  6.25 mg twice daily, consider ARB in the future.   Hyperlipidemia: Continue rosuvastatin  40 mg daily. CKD stage IIIb: Consider SGLT2 inhibitor and ARB for renal protection in the future. Elevated BMI: Consider GLP-1 receptor agonist given history of diabetes in the future.  I have personally reviewed the patients imaging data as summarized above.  I have reviewed the natural history of aortic stenosis with the  patient and family members who are present today. We have discussed the limitations of medical therapy and the poor prognosis associated with symptomatic aortic stenosis. We have also reviewed potential treatment options, including palliative medical therapy, conventional surgical aortic valve replacement, and transcatheter aortic valve replacement. We discussed treatment options in the context of this patient's specific comorbid medical conditions.   All of the patient's questions were answered today. Will make further recommendations based on the results of studies outlined above.   I spent 45 minutes reviewing all clinical data during and prior to this visit including all relevant imaging studies, laboratories, clinical information from other health systems and prior notes from both Cardiology and other specialties, interviewing the patient, conducting a complete physical examination, and coordinating care in order to formulate a comprehensive and personalized evaluation and treatment plan.   Mairlyn Tegtmeyer K Shanyn Preisler, MD  12/07/2023 10:34 AM    [facility] [facility] [address] [place], [place], [place]  [address] Phone: [phone]; Fax: [phone]

## 2023-12-06 NOTE — Telephone Encounter (Signed)
 Pt Johnny [name] called, I connected him to Ortho @ [phone]

## 2023-12-07 ENCOUNTER — Encounter: Payer: Self-pay | Admitting: Internal Medicine

## 2023-12-07 ENCOUNTER — Ambulatory Visit: Attending: Internal Medicine | Admitting: Internal Medicine

## 2023-12-07 VITALS — BP 113/77 | HR 60 | Ht 65.0 in | Wt 224.6 lb

## 2023-12-07 DIAGNOSIS — Z951 Presence of aortocoronary bypass graft: Secondary | ICD-10-CM

## 2023-12-07 DIAGNOSIS — E1122 Type 2 diabetes mellitus with diabetic chronic kidney disease: Secondary | ICD-10-CM

## 2023-12-07 DIAGNOSIS — E1159 Type 2 diabetes mellitus with other circulatory complications: Secondary | ICD-10-CM | POA: Diagnosis not present

## 2023-12-07 DIAGNOSIS — N183 Chronic kidney disease, stage 3 unspecified: Secondary | ICD-10-CM

## 2023-12-07 DIAGNOSIS — I152 Hypertension secondary to endocrine disorders: Secondary | ICD-10-CM

## 2023-12-07 DIAGNOSIS — I350 Nonrheumatic aortic (valve) stenosis: Secondary | ICD-10-CM

## 2023-12-07 DIAGNOSIS — E118 Type 2 diabetes mellitus with unspecified complications: Secondary | ICD-10-CM

## 2023-12-07 DIAGNOSIS — E785 Hyperlipidemia, unspecified: Secondary | ICD-10-CM | POA: Diagnosis not present

## 2023-12-07 DIAGNOSIS — D6869 Other thrombophilia: Secondary | ICD-10-CM

## 2023-12-07 DIAGNOSIS — I480 Paroxysmal atrial fibrillation: Secondary | ICD-10-CM

## 2023-12-07 DIAGNOSIS — E1169 Type 2 diabetes mellitus with other specified complication: Secondary | ICD-10-CM

## 2023-12-07 DIAGNOSIS — Z6837 Body mass index (BMI) 37.0-37.9, adult: Secondary | ICD-10-CM

## 2023-12-07 NOTE — Patient Instructions (Addendum)
 Medication Instructions:  No changes *If you need a refill on your cardiac medications before your next appointment, please call your pharmacy*  Lab Work: None today If you have labs (blood work) drawn today and your tests are completely normal, you will receive your results only by: MyChart Message (if you have MyChart) OR A paper copy in the mail If you have any lab test that is abnormal or we need to change your treatment, we will call you to review the results.  Testing/Procedures: Your physician has requested that you have a cardiac catheterization. Cardiac catheterization is used to diagnose and/or treat various heart conditions. Doctors may recommend this procedure for a number of different reasons. The most common reason is to evaluate chest pain. Chest pain can be a symptom of coronary artery disease (CAD), and cardiac catheterization can show whether plaque is narrowing or blocking your heart's arteries. This procedure is also used to evaluate the valves, as well as measure the blood flow and oxygen  levels in different parts of your heart. For further information please visit [email or web address] Please follow instruction sheet, as given.   Follow-Up: Per Structural Heart Team         Cardiac/Peripheral Catheterization   You are scheduled for a Cardiac Catheterization on Thursday, May 22 with Dr. Alyssa Backbone.  1. Please arrive at the [facility] (Main Entrance A) at [facility]: [address] [place], [place] [address] at 5:30 AM (This time is 3 1/2 hour(s) before your procedure to ensure your preparation).   You will receive IV fluids for extra hydration.  Procedure scheduled for 9:30 am.  Free valet parking service is available. You will check in at ADMITTING. The support person will be asked to wait in the waiting room.  It is OK to have someone drop you off and come back when you are ready to be discharged.        Special note: Every effort is made to have  your procedure done on time. Please understand that emergencies sometimes delay scheduled procedures.  2. Diet: Do not eat solid foods after midnight.  You may have clear liquids until 5 AM the day of the procedure.  3. Labs: completed 12/04/23  4. Medication instructions in preparation for your procedure:   Contrast Allergy: No  No Eliquis  after evening dose on Monday Dec 18, 2023 No Lasix  (furosemide ) on day of procedure   On the morning of your procedure, take Aspirin  81 mg and any morning medicines NOT listed above.  You may use sips of water.  5. Plan to go home the same day, you will only stay overnight if medically necessary. 6. You MUST have a responsible adult to drive you home. 7. An adult MUST be with you the first 24 hours after you arrive home. 8. Bring a current list of your medications, and the last time and date medication taken. 9. Bring ID and current insurance cards. 10.Please wear clothes that are easy to get on and off and wear slip-on shoes.  Thank you for allowing us  to care for you!   -- [facility]   You have been referred to dental services for evaluation.  Please give them a call after one week if you have not heard from them about an appointment.   [facility]: Dr. Brutus Caprice. [place] [address] [place], [place] [address] Phone: [phone]

## 2023-12-08 DIAGNOSIS — J449 Chronic obstructive pulmonary disease, unspecified: Secondary | ICD-10-CM | POA: Diagnosis not present

## 2023-12-08 DIAGNOSIS — G4733 Obstructive sleep apnea (adult) (pediatric): Secondary | ICD-10-CM | POA: Diagnosis not present

## 2023-12-11 ENCOUNTER — Other Ambulatory Visit

## 2023-12-11 DIAGNOSIS — G4733 Obstructive sleep apnea (adult) (pediatric): Secondary | ICD-10-CM | POA: Diagnosis not present

## 2023-12-12 ENCOUNTER — Ambulatory Visit: Admitting: Orthopaedic Surgery

## 2023-12-12 DIAGNOSIS — G4733 Obstructive sleep apnea (adult) (pediatric): Secondary | ICD-10-CM | POA: Diagnosis not present

## 2023-12-12 DIAGNOSIS — J449 Chronic obstructive pulmonary disease, unspecified: Secondary | ICD-10-CM | POA: Diagnosis not present

## 2023-12-13 ENCOUNTER — Ambulatory Visit: Admitting: "Endocrinology

## 2023-12-17 ENCOUNTER — Other Ambulatory Visit: Payer: Self-pay | Admitting: Family

## 2023-12-17 DIAGNOSIS — J449 Chronic obstructive pulmonary disease, unspecified: Secondary | ICD-10-CM | POA: Diagnosis not present

## 2023-12-18 DIAGNOSIS — U071 COVID-19: Secondary | ICD-10-CM | POA: Diagnosis not present

## 2023-12-19 ENCOUNTER — Telehealth: Payer: Self-pay | Admitting: *Deleted

## 2023-12-19 NOTE — Telephone Encounter (Signed)
 Cardiac Catheterization scheduled at [facility] for: Thursday Dec 21, 2023 9 AM Arrival time [facility] Main Entrance A at: 5:30 AM-pre-procedure hydration ( 3 hours per Dr Lorie Rook)  Nothing to eat after midnight prior to procedure, clear liquids until 5 AM day of procedure.  Medication instructions: -Hold:  Eliquis -none 12/19/23 until post procedure  Lasix -day before and day of procedure-per protocol GFR <60 (35) -Other usual morning medications can be taken with sips of water including aspirin  81 mg.  Plan to go home the same day, you will only stay overnight if medically necessary.  You must have responsible adult to drive you home.  Someone must be with you the first 24 hours after you arrive home.  Left message for patient to call back to review procedure instructions

## 2023-12-20 DIAGNOSIS — I480 Paroxysmal atrial fibrillation: Secondary | ICD-10-CM | POA: Diagnosis not present

## 2023-12-20 DIAGNOSIS — N1832 Chronic kidney disease, stage 3b: Secondary | ICD-10-CM | POA: Diagnosis not present

## 2023-12-20 DIAGNOSIS — I129 Hypertensive chronic kidney disease with stage 1 through stage 4 chronic kidney disease, or unspecified chronic kidney disease: Secondary | ICD-10-CM | POA: Diagnosis not present

## 2023-12-20 DIAGNOSIS — I5032 Chronic diastolic (congestive) heart failure: Secondary | ICD-10-CM | POA: Diagnosis not present

## 2023-12-20 NOTE — Telephone Encounter (Signed)
 Several unsuccessful attempts to reach patient to review instructions, left detailed voicemail message (DPR) with instructions.

## 2023-12-21 ENCOUNTER — Other Ambulatory Visit: Payer: Self-pay

## 2023-12-21 ENCOUNTER — Ambulatory Visit (HOSPITAL_COMMUNITY)
Admission: RE | Admit: 2023-12-21 | Discharge: 2023-12-21 | Disposition: A | Discharge status: Home / Self Care | Attending: Internal Medicine | Admitting: Internal Medicine

## 2023-12-21 ENCOUNTER — Ambulatory Visit: Admitting: Cardiovascular Disease

## 2023-12-21 ENCOUNTER — Encounter (HOSPITAL_COMMUNITY)
Admission: RE | Disposition: A | Discharge status: Home / Self Care | Payer: Self-pay | Source: Home / Self Care | Attending: Internal Medicine

## 2023-12-21 DIAGNOSIS — E1169 Type 2 diabetes mellitus with other specified complication: Secondary | ICD-10-CM | POA: Diagnosis not present

## 2023-12-21 DIAGNOSIS — Z7901 Long term (current) use of anticoagulants: Secondary | ICD-10-CM | POA: Insufficient documentation

## 2023-12-21 DIAGNOSIS — Z79899 Other long term (current) drug therapy: Secondary | ICD-10-CM | POA: Insufficient documentation

## 2023-12-21 DIAGNOSIS — Z87891 Personal history of nicotine dependence: Secondary | ICD-10-CM | POA: Insufficient documentation

## 2023-12-21 DIAGNOSIS — I152 Hypertension secondary to endocrine disorders: Secondary | ICD-10-CM | POA: Insufficient documentation

## 2023-12-21 DIAGNOSIS — Z955 Presence of coronary angioplasty implant and graft: Secondary | ICD-10-CM | POA: Insufficient documentation

## 2023-12-21 DIAGNOSIS — Z5982 Transportation insecurity: Secondary | ICD-10-CM | POA: Insufficient documentation

## 2023-12-21 DIAGNOSIS — I129 Hypertensive chronic kidney disease with stage 1 through stage 4 chronic kidney disease, or unspecified chronic kidney disease: Secondary | ICD-10-CM | POA: Diagnosis not present

## 2023-12-21 DIAGNOSIS — E1122 Type 2 diabetes mellitus with diabetic chronic kidney disease: Secondary | ICD-10-CM | POA: Insufficient documentation

## 2023-12-21 DIAGNOSIS — D6869 Other thrombophilia: Secondary | ICD-10-CM | POA: Diagnosis not present

## 2023-12-21 DIAGNOSIS — I350 Nonrheumatic aortic (valve) stenosis: Secondary | ICD-10-CM

## 2023-12-21 DIAGNOSIS — N1832 Chronic kidney disease, stage 3b: Secondary | ICD-10-CM | POA: Diagnosis not present

## 2023-12-21 DIAGNOSIS — I2510 Atherosclerotic heart disease of native coronary artery without angina pectoris: Secondary | ICD-10-CM | POA: Diagnosis not present

## 2023-12-21 DIAGNOSIS — Z951 Presence of aortocoronary bypass graft: Secondary | ICD-10-CM | POA: Insufficient documentation

## 2023-12-21 DIAGNOSIS — Z5986 Financial insecurity: Secondary | ICD-10-CM | POA: Diagnosis not present

## 2023-12-21 DIAGNOSIS — E785 Hyperlipidemia, unspecified: Secondary | ICD-10-CM | POA: Diagnosis not present

## 2023-12-21 DIAGNOSIS — H9192 Unspecified hearing loss, left ear: Secondary | ICD-10-CM | POA: Diagnosis not present

## 2023-12-21 DIAGNOSIS — I480 Paroxysmal atrial fibrillation: Secondary | ICD-10-CM | POA: Diagnosis not present

## 2023-12-21 DIAGNOSIS — U071 COVID-19: Secondary | ICD-10-CM | POA: Diagnosis not present

## 2023-12-21 DIAGNOSIS — E1159 Type 2 diabetes mellitus with other circulatory complications: Secondary | ICD-10-CM | POA: Insufficient documentation

## 2023-12-21 DIAGNOSIS — G4733 Obstructive sleep apnea (adult) (pediatric): Secondary | ICD-10-CM | POA: Diagnosis not present

## 2023-12-21 HISTORY — PX: RIGHT/LEFT HEART CATH AND CORONARY/GRAFT ANGIOGRAPHY: CATH118267

## 2023-12-21 LAB — POCT I-STAT EG7
Acid-Base Excess: 0 mmol/L (ref 0.0–2.0)
Acid-base deficit: 1 mmol/L (ref 0.0–2.0)
Acid-base deficit: 2 mmol/L (ref 0.0–2.0)
Bicarbonate: 25.7 mmol/L (ref 20.0–28.0)
Bicarbonate: 27.2 mmol/L (ref 20.0–28.0)
Bicarbonate: 25 mmol/L (ref 20.0–28.0)
Calcium, Ion: 1.34 mmol/L (ref 1.15–1.40)
Calcium, Ion: 1.4 mmol/L (ref 1.15–1.40)
Calcium, Ion: 1.36 mmol/L (ref 1.15–1.40)
HCT: 39 % (ref 39.0–52.0)
HCT: 40 % (ref 39.0–52.0)
HCT: 38 % — ABNORMAL LOW (ref 39.0–52.0)
Hemoglobin: 13.3 g/dL (ref 13.0–17.0)
Hemoglobin: 13.6 g/dL (ref 13.0–17.0)
Hemoglobin: 12.9 g/dL — ABNORMAL LOW (ref 13.0–17.0)
O2 Saturation: 69 %
O2 Saturation: 73 %
O2 Saturation: 69 %
Potassium: 4 mmol/L (ref 3.5–5.1)
Potassium: 4 mmol/L (ref 3.5–5.1)
Potassium: 4 mmol/L (ref 3.5–5.1)
Sodium: 140 mmol/L (ref 135–145)
Sodium: 141 mmol/L (ref 135–145)
Sodium: 140 mmol/L (ref 135–145)
TCO2: 27 mmol/L (ref 22–32)
TCO2: 29 mmol/L (ref 22–32)
TCO2: 27 mmol/L (ref 22–32)
pCO2, Ven: 50.8 mmHg (ref 44–60)
pCO2, Ven: 51.4 mmHg (ref 44–60)
pCO2, Ven: 51.1 mmHg (ref 44–60)
pH, Ven: 7.312 (ref 7.25–7.43)
pH, Ven: 7.332 (ref 7.25–7.43)
pH, Ven: 7.297 (ref 7.25–7.43)
pO2, Ven: 40 mmHg (ref 32–45)
pO2, Ven: 42 mmHg (ref 32–45)
pO2, Ven: 41 mmHg (ref 32–45)

## 2023-12-21 LAB — GLUCOSE, CAPILLARY
Glucose-Capillary: 87 mg/dL (ref 70–99)
Glucose-Capillary: 103 mg/dL — ABNORMAL HIGH (ref 70–99)

## 2023-12-21 LAB — POCT I-STAT 7, (LYTES, BLD GAS, ICA,H+H)
Acid-base deficit: 2 mmol/L (ref 0.0–2.0)
Acid-base deficit: 3 mmol/L — ABNORMAL HIGH (ref 0.0–2.0)
Bicarbonate: 25.5 mmol/L (ref 20.0–28.0)
Bicarbonate: 23 mmol/L (ref 20.0–28.0)
Calcium, Ion: 1.37 mmol/L (ref 1.15–1.40)
Calcium, Ion: 1.35 mmol/L (ref 1.15–1.40)
HCT: 40 % (ref 39.0–52.0)
HCT: 39 % (ref 39.0–52.0)
Hemoglobin: 13.6 g/dL (ref 13.0–17.0)
Hemoglobin: 13.3 g/dL (ref 13.0–17.0)
O2 Saturation: 66 %
O2 Saturation: 96 %
Potassium: 4 mmol/L (ref 3.5–5.1)
Potassium: 4 mmol/L (ref 3.5–5.1)
Sodium: 140 mmol/L (ref 135–145)
Sodium: 140 mmol/L (ref 135–145)
TCO2: 27 mmol/L (ref 22–32)
TCO2: 24 mmol/L (ref 22–32)
pCO2 arterial: 51.1 mmHg — ABNORMAL HIGH (ref 32–48)
pCO2 arterial: 42.8 mmHg (ref 32–48)
pH, Arterial: 7.305 — ABNORMAL LOW (ref 7.35–7.45)
pH, Arterial: 7.339 — ABNORMAL LOW (ref 7.35–7.45)
pO2, Arterial: 38 mmHg — CL (ref 83–108)
pO2, Arterial: 84 mmHg (ref 83–108)

## 2023-12-21 SURGERY — RIGHT/LEFT HEART CATH AND CORONARY/GRAFT ANGIOGRAPHY
Anesthesia: LOCAL

## 2023-12-21 MED ORDER — LABETALOL HCL 5 MG/ML IV SOLN: 10.0000 mg | INTRAVENOUS | Status: DC | PRN

## 2023-12-21 MED ORDER — SODIUM CHLORIDE 0.9 % WEIGHT BASED INFUSION
3.0000 mL/kg/h | INTRAVENOUS | Status: AC
  Administered 2023-12-21: 3 mL/kg/h via INTRAVENOUS

## 2023-12-21 MED ORDER — LIDOCAINE HCL (PF) 1 % IJ SOLN
INTRAMUSCULAR | Status: AC
  Filled 2023-12-21: qty 30

## 2023-12-21 MED ORDER — HEPARIN SODIUM (PORCINE) 1000 UNIT/ML IJ SOLN
INTRAMUSCULAR | Status: AC
  Filled 2023-12-21: qty 10

## 2023-12-21 MED ORDER — HYDRALAZINE HCL 20 MG/ML IJ SOLN: 10.0000 mg | INTRAMUSCULAR | Status: DC | PRN

## 2023-12-21 MED ORDER — FENTANYL CITRATE (PF) 100 MCG/2ML IJ SOLN
INTRAMUSCULAR | Status: DC | PRN
  Administered 2023-12-21: 25 ug via INTRAVENOUS

## 2023-12-21 MED ORDER — HEPARIN (PORCINE) IN NACL 1000-0.9 UT/500ML-% IV SOLN
INTRAVENOUS | Status: DC | PRN
  Administered 2023-12-21 (×2): 500 mL

## 2023-12-21 MED ORDER — SODIUM CHLORIDE 0.9 % IV SOLN: INTRAVENOUS | Status: DC

## 2023-12-21 MED ORDER — VERAPAMIL HCL 2.5 MG/ML IV SOLN
INTRAVENOUS | Status: DC | PRN
  Administered 2023-12-21: 10 mL via INTRA_ARTERIAL

## 2023-12-21 MED ORDER — ASPIRIN 81 MG PO CHEW
81.0000 mg | CHEWABLE_TABLET | ORAL | Status: AC
  Administered 2023-12-21: 81 mg via ORAL
  Filled 2023-12-21: qty 1

## 2023-12-21 MED ORDER — SODIUM CHLORIDE 0.9% FLUSH: 3.0000 mL | INTRAVENOUS | Status: DC | PRN

## 2023-12-21 MED ORDER — LIDOCAINE HCL (PF) 1 % IJ SOLN
INTRAMUSCULAR | Status: DC | PRN
  Administered 2023-12-21 (×2): 2 mL via INTRADERMAL

## 2023-12-21 MED ORDER — RANOLAZINE ER 500 MG PO TB12
500.0000 mg | ORAL_TABLET | ORAL | Status: AC
  Administered 2023-12-21: 500 mg via ORAL
  Filled 2023-12-21: qty 1

## 2023-12-21 MED ORDER — SODIUM CHLORIDE 0.9 % WEIGHT BASED INFUSION: 1.0000 mL/kg/h | INTRAVENOUS | Status: DC

## 2023-12-21 MED ORDER — SODIUM CHLORIDE 0.9% FLUSH: 3.0000 mL | Freq: Two times a day (BID) | INTRAVENOUS | Status: DC

## 2023-12-21 MED ORDER — ONDANSETRON HCL 4 MG/2ML IJ SOLN
4.0000 mg | Freq: Four times a day (QID) | INTRAMUSCULAR | Status: DC | PRN
Start: 2023-12-21 — End: 2023-12-21

## 2023-12-21 MED ORDER — SODIUM CHLORIDE 0.9 % IV SOLN: 250.0000 mL | INTRAVENOUS | Status: DC | PRN

## 2023-12-21 MED ORDER — VERAPAMIL HCL 2.5 MG/ML IV SOLN
INTRAVENOUS | Status: AC
  Filled 2023-12-21: qty 2

## 2023-12-21 MED ORDER — MIDAZOLAM HCL 2 MG/2ML IJ SOLN
INTRAMUSCULAR | Status: DC | PRN
Start: 2023-12-21 — End: 2023-12-21
  Administered 2023-12-21: 1 mg via INTRAVENOUS

## 2023-12-21 MED ORDER — ACETAMINOPHEN 325 MG PO TABS: 650.0000 mg | ORAL_TABLET | ORAL | Status: DC | PRN

## 2023-12-21 MED ORDER — FENTANYL CITRATE (PF) 100 MCG/2ML IJ SOLN
INTRAMUSCULAR | Status: AC
  Filled 2023-12-21: qty 2

## 2023-12-21 MED ORDER — MIDAZOLAM HCL 2 MG/2ML IJ SOLN
INTRAMUSCULAR | Status: AC
  Filled 2023-12-21: qty 2

## 2023-12-21 MED ORDER — ISOSORBIDE MONONITRATE ER 30 MG PO TB24
30.0000 mg | ORAL_TABLET | ORAL | Status: AC
  Administered 2023-12-21: 30 mg via ORAL
  Filled 2023-12-21: qty 1

## 2023-12-21 MED ORDER — IOHEXOL 350 MG/ML SOLN
INTRAVENOUS | Status: DC | PRN
  Administered 2023-12-21: 40 mL

## 2023-12-21 MED ORDER — HEPARIN SODIUM (PORCINE) 1000 UNIT/ML IJ SOLN
INTRAMUSCULAR | Status: DC | PRN
  Administered 2023-12-21: 5000 [IU] via INTRA_ARTERIAL

## 2023-12-21 MED ORDER — CARVEDILOL 3.125 MG PO TABS
6.2500 mg | ORAL_TABLET | Freq: Once | ORAL | Status: AC
  Administered 2023-12-21: 6.25 mg via ORAL
  Filled 2023-12-21: qty 2

## 2023-12-21 SURGICAL SUPPLY — 12 items
CATH INFINITI AMBI 6FR TG (CATHETERS) IMPLANT
CATH INFINITI JR4 5F (CATHETERS) IMPLANT
CATH SWAN GANZ 7F STRAIGHT (CATHETERS) IMPLANT
DEVICE RAD COMP TR BAND LRG (VASCULAR PRODUCTS) IMPLANT
GLIDESHEATH SLEND SS 6F .021 (SHEATH) IMPLANT
GUIDEWIRE TIGER .035X300 (WIRE) IMPLANT
PACK CARDIAC CATHETERIZATION (CUSTOM PROCEDURE TRAY) ×1 IMPLANT
SET ATX-X65L (MISCELLANEOUS) IMPLANT
SHEATH PINNACLE 7F 10CM (SHEATH) IMPLANT
SHEATH PROBE COVER 6X72 (BAG) IMPLANT
WIRE EMERALD 3MM-J .035X260CM (WIRE) IMPLANT
WIRE MICRO SET SILHO 5FR 7 (SHEATH) IMPLANT

## 2023-12-21 NOTE — Interval H&P Note (Signed)
 History and Physical Interval Note:  12/21/2023 6:45 AM  Johnny [name]  has presented today for surgery, with the diagnosis of severe aortic stenosis.  The various methods of treatment have been discussed with the patient and family. After consideration of risks, benefits and other options for treatment, the patient has consented to  Procedure(s): RIGHT/LEFT HEART CATH AND CORONARY ANGIOGRAPHY (N/A) as a surgical intervention.  The patient's history has been reviewed, patient examined, no change in status, stable for surgery.  I have reviewed the patient's chart and labs.  Questions were answered to the patient's satisfaction.     Zachory Mangual K Sabastian Raimondi

## 2023-12-21 NOTE — Progress Notes (Signed)
 TR BAND REMOVAL  LOCATION:    right radial  DEFLATED PER PROTOCOL:    Yes.    TIME BAND OFF / DRESSING APPLIED: 12/21/23 at 1245   SITE UPON ARRIVAL:    Level 0  SITE AFTER BAND REMOVAL:    Level 0  CIRCULATION SENSATION AND MOVEMENT:    Within Normal Limits   Yes.    COMMENTS:

## 2023-12-21 NOTE — Discharge Instructions (Addendum)
 Restart Eliquis  tomorrow morning  Drink plenty of fluid for 48 hours and keep wrist elevated at heart level for 24 hours  Radial Site Care   This sheet gives you information about how to care for yourself after your procedure. Your health care provider may also give you more specific instructions. If you have problems or questions, contact your health care provider. What can I expect after the procedure? After the procedure, it is common to have: Bruising and tenderness at the catheter insertion area. Follow these instructions at home: Medicines Take over-the-counter and prescription medicines only as told by your health care provider. Insertion site care Follow instructions from your health care provider about how to take care of your insertion site. Make sure you: Wash your hands with soap and water before you change your bandage (dressing). If soap and water are not available, use hand sanitizer. remove your dressing as told by your health care provider. In 24 hours Check your insertion site every day for signs of infection. Check for: Redness, swelling, or pain. Fluid or blood. Pus or a bad smell. Warmth. Do not take baths, swim, or use a hot tub until your health care provider approves. You may shower 24-48 hours after the procedure, or as directed by your health care provider. Remove the dressing and gently wash the site with plain soap and water. Pat the area dry with a clean towel. Do not rub the site. That could cause bleeding. Do not apply powder or lotion to the site. Activity   For 24 hours after the procedure, or as directed by your health care provider: Do not flex or bend the affected arm. Do not push or pull heavy objects with the affected arm. Do not drive yourself home from the hospital or clinic. You may drive 24 hours after the procedure unless your health care provider tells you not to. Do not operate machinery or power tools. Do not lift anything that is  heavier than 10 lb (4.5 kg), or the limit that you are told, until your health care provider says that it is safe. For 4 days Ask your health care provider when it is okay to: Return to work or school. Resume usual physical activities or sports. Resume sexual activity. General instructions If the catheter site starts to bleed, raise your arm and put firm pressure on the site. If the bleeding does not stop, get help right away. This is a medical emergency. If you went home on the same day as your procedure, a responsible adult should be with you for the first 24 hours after you arrive home. Keep all follow-up visits as told by your health care provider. This is important. Contact a health care provider if: You have a fever. You have redness, swelling, or yellow drainage around your insertion site. Get help right away if: You have unusual pain at the radial site. The catheter insertion area swells very fast. The insertion area is bleeding, and the bleeding does not stop when you hold steady pressure on the area. Your arm or hand becomes pale, cool, tingly, or numb. These symptoms may represent a serious problem that is an emergency. Do not wait to see if the symptoms will go away. Get medical help right away. Call your local emergency services (911 in the [place] Do not drive yourself to the hospital. Summary After the procedure, it is common to have bruising and tenderness at the site. Follow instructions from your health care provider about how to  take care of your radial site wound. Check the wound every day for signs of infection. Do not lift anything that is heavier than 10 lb (4.5 kg), or the limit that you are told, until your health care provider says that it is safe. This information is not intended to replace advice given to you by your health care provider. Make sure you discuss any questions you have with your health care provider. Document Revised: 08/23/2017 Document Reviewed:  08/23/2017 Elsevier Patient Education  2020 [employer].

## 2023-12-22 ENCOUNTER — Encounter (HOSPITAL_COMMUNITY): Payer: Self-pay | Admitting: Internal Medicine

## 2023-12-22 LAB — LAB REPORT - SCANNED
Creatinine, POC: 90.5 mg/dL
EGFR: 51

## 2023-12-26 ENCOUNTER — Ambulatory Visit: Admitting: Orthopaedic Surgery

## 2023-12-26 ENCOUNTER — Ambulatory Visit (INDEPENDENT_AMBULATORY_CARE_PROVIDER_SITE_OTHER): Admitting: Sports Medicine

## 2023-12-26 ENCOUNTER — Other Ambulatory Visit: Payer: Self-pay

## 2023-12-26 ENCOUNTER — Encounter: Payer: Self-pay | Admitting: Sports Medicine

## 2023-12-26 DIAGNOSIS — M19011 Primary osteoarthritis, right shoulder: Secondary | ICD-10-CM | POA: Diagnosis not present

## 2023-12-26 DIAGNOSIS — G8929 Other chronic pain: Secondary | ICD-10-CM | POA: Diagnosis not present

## 2023-12-26 DIAGNOSIS — M1711 Unilateral primary osteoarthritis, right knee: Secondary | ICD-10-CM | POA: Diagnosis not present

## 2023-12-26 DIAGNOSIS — M25511 Pain in right shoulder: Secondary | ICD-10-CM

## 2023-12-26 MED ORDER — METHYLPREDNISOLONE ACETATE 40 MG/ML IJ SUSP
40.0000 mg | INTRAMUSCULAR | Status: AC | PRN
  Administered 2023-12-26: 40 mg via INTRA_ARTICULAR

## 2023-12-26 MED ORDER — LIDOCAINE HCL 1 % IJ SOLN
2.0000 mL | INTRAMUSCULAR | Status: AC | PRN
  Administered 2023-12-26: 2 mL

## 2023-12-26 MED ORDER — BUPIVACAINE HCL 0.25 % IJ SOLN
2.0000 mL | INTRAMUSCULAR | Status: AC | PRN
  Administered 2023-12-26: 2 mL via INTRA_ARTICULAR

## 2023-12-26 NOTE — Progress Notes (Signed)
 Office Visit Note   Patient: Johnny [name]           Date of Birth: 1945-09-15           MRN: [id number] Visit Date: 12/26/2023              Requested by: Duncan Gibson, NP [address] [place],  [place] [address] PCP: Duncan Gibson, NP   Assessment & Plan: Visit Diagnoses:  1. Primary osteoarthritis of right knee   2. Primary osteoarthritis, right shoulder     Plan: History of Present Illness Johnny [name] is a 78 year old male who presents with right shoulder and right knee pain.  He experiences significant limitations in movement of the right shoulder, particularly in flexion and abduction.  Denies any radicular symptoms.  The right knee has a history of injury from 1961, resulting in a popping and dislocating sensation. The knee pops out and then goes back in, with a clicking sound during movement.  Physical Exam MUSCULOSKELETAL: Right shoulder flexion 100 degrees, abduction 75 degrees, external rotation 20 degrees.  Pain associated with all motion.  Right knee no effusion, near full extension, good flexion, joint line tenderness present, collateral ligaments solid.  Assessment and Plan Osteoarthritis of right shoulder and knee Chronic osteoarthritis in right shoulder and knee with severe movement limitation and pain. NSAIDs contraindicated due to eliquis . Cortisone injections recommended. - Administer cortisone injection in right knee. - Refer to Dr. Vaughn Georges for cortisone injection in right shoulder. - Consider surgical intervention if conservative management fails.    Subjective: Chief Complaint  Patient presents with   Right Knee - Pain   Right Shoulder - Pain   Lower Back - Pain    HPI  Review of Systems  Constitutional: Negative.   HENT: Negative.    Eyes: Negative.   Respiratory: Negative.    Cardiovascular: Negative.   Gastrointestinal: Negative.   Endocrine: Negative.   Genitourinary: Negative.   Skin: Negative.    Allergic/Immunologic: Negative.   Neurological: Negative.   Hematological: Negative.   Psychiatric/Behavioral: Negative.    All other systems reviewed and are negative.    Objective: Vital Signs: There were no vitals taken for this visit.  Physical Exam Vitals and nursing note reviewed.  Constitutional:      Appearance: He is well-developed.  HENT:     Head: Normocephalic and atraumatic.  Eyes:     Pupils: Pupils are equal, round, and reactive to light.  Pulmonary:     Effort: Pulmonary effort is normal.  Abdominal:     Palpations: Abdomen is soft.  Musculoskeletal:        General: Normal range of motion.     Cervical back: Neck supple.  Skin:    General: Skin is warm.  Neurological:     Mental Status: He is alert and oriented to person, place, and time.  Psychiatric:        Behavior: Behavior normal.        Thought Content: Thought content normal.        Judgment: Judgment normal.      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) (HCC) 06/19/2022   AKI (acute kidney injury) (HCC) 12/14/2021   Essential hypertension 12/14/2021   Aortic stenosis 12/14/2021   Chest pain 12/14/2021   Chronic respiratory failure with hypoxia (HCC) 12/14/2021   Symptomatic bradycardia 12/14/2021   PAD (peripheral artery disease) (HCC) 12/14/2021   Excessive daytime sleepiness 12/14/2021   Stage 3b chronic kidney disease (HCC) 10/20/2021   PAF (paroxysmal atrial fibrillation) (HCC) 04/12/2021   OSA (obstructive sleep apnea) 09/15/2020   Mixed  hyperlipidemia 09/06/2020   Chronic diastolic HF (heart failure) (HCC) 09/06/2020   Anxiety with depression 09/06/2020   Coronary artery disease involving native coronary artery of native heart 09/06/2020   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[place], [place]    Family History  Problem Relation Age of Onset   Cancer Father    Stroke Sister    Stroke Brother        x 2    Past Surgical History:  Procedure Laterality Date   BACK SURGERY  1975   Lower Back Surgery, Dr.Seltzer   BACK SURGERY  1985   Dr.Seltzer   LEFT HEART CATH AND CORS/GRAFTS ANGIOGRAPHY N/A 11/17/2022   Procedure: LEFT HEART CATH AND CORS/GRAFTS ANGIOGRAPHY;  Surgeon: Lucendia Rusk, MD;  Location: [facility];  Service: Cardiovascular;  Laterality: N/A;   RIGHT/LEFT HEART CATH AND CORONARY/GRAFT ANGIOGRAPHY N/A 12/21/2023   Procedure: RIGHT/LEFT HEART CATH AND CORONARY/GRAFT ANGIOGRAPHY;  Surgeon: Kyra Phy, MD;  Location: [facility];  Service: Cardiovascular;  Laterality: N/A;   SHOULDER SURGERY Left 1965   Dr.Seltzer   TONSILLECTOMY  1950   Dr.Perellie   Social History   Occupational History   Not on file  Tobacco Use   Smoking status: Former    Current packs/day: 1.00    Average packs/day: 1 pack/day for 10.0 years (10.0 ttl pk-yrs)    Types: Cigarettes   Smokeless tobacco: Never   Tobacco comments:    Quit at age 55  Vaping Use   Vaping status: Never Used  Substance and Sexual Activity   Alcohol use: Yes    Alcohol/week: 1.0 standard drink of alcohol    Types: 1 Glasses of wine per week    Comment: occasion   Drug use: Never   Sexual activity: Not on file

## 2023-12-26 NOTE — Progress Notes (Signed)
   Procedure Note  Patient: Johnny [name]             Date of Birth: 06/21/1946           MRN: [id number]             Visit Date: 12/26/2023  Procedures: Visit Diagnoses:  1. Chronic right shoulder pain   2. Primary osteoarthritis, right shoulder    Large Joint Inj: R glenohumeral on 12/26/2023 4:02 PM Indications: pain Details: 22 G 3.5 in needle, ultrasound-guided posterior approach Medications: 2 mL lidocaine  1 %; 2 mL bupivacaine 0.25 %; 40 mg methylPREDNISolone acetate 40 MG/ML Outcome: tolerated well, no immediate complications  US -guided glenohumeral joint injection, right shoulder After discussion on risks/benefits/indications, informed verbal consent was obtained. A timeout was then performed. The patient was positioned lying lateral recumbent on examination table. The patient's shoulder was prepped with betadine and multiple alcohol swabs and utilizing ultrasound guidance, the patient's glenohumeral joint was identified on ultrasound. Using ultrasound guidance a 22-gauge, 3.5 inch needle with a mixture of 2:2:1 cc's lidocaine :bupivicaine:depomedrol was directed from a lateral to medial direction via in-plane technique into the glenohumeral joint with visualization of appropriate spread of injectate into the joint. Patient tolerated the procedure well without immediate complications.      Procedure, treatment alternatives, risks and benefits explained, specific risks discussed. Consent was given by the patient. Immediately prior to procedure a time out was called to verify the correct patient, procedure, equipment, support staff and site/side marked as required. Patient was prepped and draped in the usual sterile fashion.     - patient tolerated procedure well, discussed post-injection protocol - follow-up with Dr. Christiane Cowing as indicated; I am happy to see them as needed  Shauna Del, DO Primary Care Sports Medicine Physician  [facility]  This note was  dictated using Dragon naturally speaking software and may contain errors in syntax, spelling, or content which have not been identified prior to signing this note.

## 2023-12-28 ENCOUNTER — Ambulatory Visit: Admitting: Adult Health

## 2024-01-01 ENCOUNTER — Other Ambulatory Visit: Payer: Self-pay

## 2024-01-01 DIAGNOSIS — I350 Nonrheumatic aortic (valve) stenosis: Secondary | ICD-10-CM

## 2024-01-08 DIAGNOSIS — G4733 Obstructive sleep apnea (adult) (pediatric): Secondary | ICD-10-CM | POA: Diagnosis not present

## 2024-01-08 DIAGNOSIS — J449 Chronic obstructive pulmonary disease, unspecified: Secondary | ICD-10-CM | POA: Diagnosis not present

## 2024-01-10 ENCOUNTER — Telehealth: Payer: Self-pay | Admitting: Internal Medicine

## 2024-01-10 NOTE — Telephone Encounter (Signed)
 Patient called and cancelled CT Morphe because he was informed it will cost him $99.17. He requested a message be sent to Dr. Lorie Rook to determine whether CT Johnny [name] is absolutely necessary at this time. Please advise.

## 2024-01-11 DIAGNOSIS — G4733 Obstructive sleep apnea (adult) (pediatric): Secondary | ICD-10-CM | POA: Diagnosis not present

## 2024-01-11 NOTE — Telephone Encounter (Signed)
 Left message for pt to contact the office.  The pt will need 6/16 CT scans for TAVR evaluation.  I have rescheduled the CT scans for 6/16.  The pt was scheduled to see Dr Vena Gibes yesterday and I will need an update on his dental plan. Will await return call from the patient.

## 2024-01-11 NOTE — Telephone Encounter (Signed)
 Attempted to reach the pt again, left message to contact the office.

## 2024-01-12 DIAGNOSIS — J449 Chronic obstructive pulmonary disease, unspecified: Secondary | ICD-10-CM | POA: Diagnosis not present

## 2024-01-12 DIAGNOSIS — G4733 Obstructive sleep apnea (adult) (pediatric): Secondary | ICD-10-CM | POA: Diagnosis not present

## 2024-01-15 ENCOUNTER — Ambulatory Visit: Payer: Self-pay | Admitting: Internal Medicine

## 2024-01-15 ENCOUNTER — Ambulatory Visit (HOSPITAL_COMMUNITY)

## 2024-01-15 ENCOUNTER — Ambulatory Visit (HOSPITAL_COMMUNITY)
Admission: RE | Admit: 2024-01-15 | Discharge: 2024-01-15 | Disposition: A | Discharge status: Home / Self Care | Source: Ambulatory Visit | Attending: Internal Medicine | Admitting: Internal Medicine

## 2024-01-15 DIAGNOSIS — K5730 Diverticulosis of large intestine without perforation or abscess without bleeding: Secondary | ICD-10-CM | POA: Diagnosis not present

## 2024-01-15 DIAGNOSIS — I7121 Aneurysm of the ascending aorta, without rupture: Secondary | ICD-10-CM | POA: Insufficient documentation

## 2024-01-15 DIAGNOSIS — J439 Emphysema, unspecified: Secondary | ICD-10-CM | POA: Diagnosis not present

## 2024-01-15 DIAGNOSIS — Z951 Presence of aortocoronary bypass graft: Secondary | ICD-10-CM | POA: Insufficient documentation

## 2024-01-15 DIAGNOSIS — I2510 Atherosclerotic heart disease of native coronary artery without angina pectoris: Secondary | ICD-10-CM | POA: Diagnosis not present

## 2024-01-15 DIAGNOSIS — I350 Nonrheumatic aortic (valve) stenosis: Secondary | ICD-10-CM

## 2024-01-15 DIAGNOSIS — Z01810 Encounter for preprocedural cardiovascular examination: Secondary | ICD-10-CM | POA: Diagnosis not present

## 2024-01-15 DIAGNOSIS — K429 Umbilical hernia without obstruction or gangrene: Secondary | ICD-10-CM | POA: Diagnosis not present

## 2024-01-15 DIAGNOSIS — K8020 Calculus of gallbladder without cholecystitis without obstruction: Secondary | ICD-10-CM | POA: Diagnosis not present

## 2024-01-15 MED ORDER — IOHEXOL 350 MG/ML SOLN
100.0000 mL | Freq: Once | INTRAVENOUS | Status: AC | PRN
  Administered 2024-01-15: 100 mL via INTRAVENOUS

## 2024-01-17 DIAGNOSIS — J449 Chronic obstructive pulmonary disease, unspecified: Secondary | ICD-10-CM | POA: Diagnosis not present

## 2024-01-18 ENCOUNTER — Ambulatory Visit: Admitting: Surgery

## 2024-01-18 DIAGNOSIS — U071 COVID-19: Secondary | ICD-10-CM | POA: Diagnosis not present

## 2024-01-19 ENCOUNTER — Telehealth: Payer: Self-pay

## 2024-01-19 NOTE — Telephone Encounter (Signed)
 Received message from Dr. Hedwig Livers office that the patient was scheduled for extractions coming up but cancelled his appointment.  When return call was attempted, the office was closed.   Will call Monday for details.  Phone: [phone]

## 2024-01-21 DIAGNOSIS — U071 COVID-19: Secondary | ICD-10-CM | POA: Diagnosis not present

## 2024-01-22 ENCOUNTER — Encounter: Admitting: Surgery

## 2024-01-22 ENCOUNTER — Ambulatory Visit: Admitting: Surgery

## 2024-01-22 NOTE — Telephone Encounter (Signed)
 Spoke with Amy at Dr. Jettie office.  The patient was originally scheduled for 8 extractions tomorrow but cancelled appointment last week due to cost concerns.  While there are other areas of concern, Dr. Celena deemed those 8 teeth for priority removal for TAVR clearance.

## 2024-01-23 ENCOUNTER — Encounter: Payer: Self-pay | Admitting: Surgery

## 2024-01-23 ENCOUNTER — Other Ambulatory Visit: Payer: Self-pay | Admitting: Physician Assistant

## 2024-01-23 ENCOUNTER — Ambulatory Visit: Attending: Surgery | Admitting: Surgery

## 2024-01-23 ENCOUNTER — Encounter: Payer: Self-pay | Admitting: Physician Assistant

## 2024-01-23 VITALS — BP 104/71 | HR 60 | Resp 18 | Ht 66.0 in | Wt 218.0 lb

## 2024-01-23 DIAGNOSIS — I350 Nonrheumatic aortic (valve) stenosis: Secondary | ICD-10-CM

## 2024-01-23 NOTE — Progress Notes (Signed)
 Pre Surgical Assessment: 5 M Walk Test  39M=16.26ft  5 Meter Walk Test- trial 1: 13.95 seconds 5 Meter Walk Test- trial 2: 20.97 seconds 5 Meter Walk Test- trial 3: 18.05 seconds 5 Meter Walk Test Average: 17.65 seconds

## 2024-01-23 NOTE — Progress Notes (Signed)
 Patient ID: Johnny [name], male   DOB: 1946-01-26, 78 y.o.   MRN: [id number]  [facility]   MULTIDISCIPLINARY HEART VALVE CLINIC   CARDIOTHORACIC SURGERY CONSULTATION REPORT  PCP is Medina-Vargas, Jereld BROCKS, NP Referring Provider is Lurena Red, MD Primary Cardiologist is Lonni LITTIE Nanas, MD  Reason for consultation: Severe aortic stenosis  HPI:  The patient is a 78 year old gentleman with a history of hypertension, hyperlipidemia, morbid obesity, type 2 diabetes, stage IIIb CKD, PAF on Eliquis , sick sinus syndrome status post permanent pacemaker, coronary artery disease status post PCI of the RCA and left circumflex with CABG in 2015, and severe aortic stenosis who was referred for consideration of TAVR.  His most recent echocardiogram on 11/06/2023 showed a trileaflet aortic valve with moderate calcification.  The mean gradient was 32 mmHg with a peak of 59.6 mmHg.  Aortic valve area by VTI was 0.94 cm with a dimensionless index of 0.3.  Left ventricular ejection fraction was 60 to 65% with grade 2 diastolic dysfunction.  Stroke-volume index was low at 31.  He presents with worsening exertional shortness of breath and fatigue and has also been having frequent episodes of lightheadedness and dizziness with position changes.  He is now getting short of breath with walking in his house and had to take several breaks walking into my office today.  He loves to sing but has not had the wind to be able to sing over the past several months.  He denies peripheral edema.  He has had no orthopnea or PND.  Denies any chest pain.  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[place], [place]    Past Surgical History:  Procedure Laterality Date   BACK SURGERY  1975   Lower Back Surgery, Dr.Seltzer   BACK SURGERY  1985   Dr.Seltzer   LEFT HEART CATH AND CORS/GRAFTS  ANGIOGRAPHY N/A 11/17/2022   Procedure: LEFT HEART CATH AND CORS/GRAFTS ANGIOGRAPHY;  Surgeon: Dann Candyce RAMAN, MD;  Location: [facility];  Service: Cardiovascular;  Laterality: N/A;   RIGHT/LEFT HEART CATH AND CORONARY/GRAFT ANGIOGRAPHY N/A 12/21/2023   Procedure: RIGHT/LEFT HEART CATH AND CORONARY/GRAFT ANGIOGRAPHY;  Surgeon: Red Lurena POUR, MD;  Location: [facility];  Service: Cardiovascular;  Laterality: N/A;   SHOULDER SURGERY Left 1965   Dr.Seltzer   TONSILLECTOMY  1950   Dr.Perellie    Family History  Problem Relation Age of Onset   Cancer Father    Stroke Sister    Stroke Brother        x 2    Social History   Socioeconomic History   Marital status: Widowed    Spouse name: Not on file   Number of children: Not on file   Years of education: Not on file   Highest education level: 12th grade  Occupational History   Not on file  Tobacco Use   Smoking status: Former    Current packs/day: 1.00    Average packs/day: 1 pack/day for 10.0 years (10.0 ttl pk-yrs)    Types: Cigarettes   Smokeless tobacco: Never   Tobacco comments:    Quit at age 2  Vaping Use   Vaping status: Never Used  Substance and Sexual Activity   Alcohol use: Yes    Alcohol/week: 1.0 standard drink of alcohol    Types: 1 Glasses  of wine per week    Comment: occasion   Drug use: Never   Sexual activity: Not on file  Other Topics Concern   Not on file  Social History Narrative   Tobacco use, amount per day now: None   Past tobacco use, amount per day: 1 pack   How many years did you use tobacco: 10 years.   Alcohol use (drinks per week): None   Diet:   Do you drink/eat things with caffeine: Yes   Marital status:  Widow                                What year were you married? 1972   Do you live in a house, apartment, assisted living, condo, trailer, etc.? House   Is it one or more stories? One   How many persons live in your home? One   Do you have pets in your home?(  please list) No   Highest Level of education completed? 12th grade.   Current or past profession: Psychologist, occupational, Investment banker, operational, Clinical biochemist.    Do you exercise?   No                               Type and how often?   Do you have a living will? Yes   Do you have a DNR form?     No                              If not, do you want to discuss one?   Do you have signed POA/HPOA forms?  Yes                      If so, please bring to you appointment      Do you have any difficulty bathing or dressing yourself? No   Do you have any difficulty preparing food or eating? No   Do you have any difficulty managing your medications? No   Do you have any difficulty managing your finances? Yes   Do you have any difficulty affording your medications? No   Social Drivers of Corporate investment banker Strain: High Risk (07/02/2023)   Overall Financial Resource Strain (CARDIA)    Difficulty of Paying Living Expenses: Hard  Food Insecurity: Food Insecurity Present (07/02/2023)   Hunger Vital Sign    Worried About Running Out of Food in the Last Year: Sometimes true    Ran Out of Food in the Last Year: Sometimes true  Transportation Needs: Unmet Transportation Needs (07/02/2023)   PRAPARE - Administrator, Civil Service (Medical): Yes    Lack of Transportation (Non-Medical): No  Physical Activity: Unknown (07/02/2023)   Exercise Vital Sign    Days of Exercise per Week: 0 days    Minutes of Exercise per Session: Not on file  Stress: Stress Concern Present (07/02/2023)   [employer] of Occupational Health - Occupational Stress Questionnaire    Feeling of Stress : To some extent  Social Connections: Moderately Isolated (07/02/2023)   Social Connection and Isolation Panel    Frequency of Communication with Friends and Family: More than three times a week    Frequency of Social Gatherings with Friends and Family: Never    Attends Religious Services: More than 4 times  per year    Active Member of Clubs  or Organizations: No    Attends Banker Meetings: Not on file    Marital Status: Widowed  Intimate Partner Violence: Not At Risk (11/17/2022)   Humiliation, Afraid, Rape, and Kick questionnaire    Fear of Current or Ex-Partner: No    Emotionally Abused: No    Physically Abused: No    Sexually Abused: No    Prior to Admission medications   Medication Sig Start Date End Date Taking? Authorizing Provider  albuterol  (VENTOLIN  HFA) 108 (90 Base) MCG/ACT inhaler Inhale 1 puff into the lungs every 6 (six) hours as needed for wheezing or shortness of breath.    [provider]  amiodarone  (PACERONE ) 200 MG tablet TAKE 1 TABLET BY MOUTH DAILY 08/09/23   Medina-Vargas, Monina C, NP  apixaban  (ELIQUIS ) 5 MG TABS tablet TAKE 1 TABLET BY MOUTH TWICE  DAILY 12/18/23   Medina-Vargas, Monina C, NP  carvedilol  (COREG ) 6.25 MG tablet TAKE 1 TABLET BY MOUTH TWICE  DAILY 12/18/23   Medina-Vargas, Monina C, NP  cyanocobalamin  (VITAMIN B12) 1000 MCG tablet Take 1 tablet (1,000 mcg total) by mouth daily. 11/05/22   Camara, Amadou, MD  famotidine  (PEPCID ) 40 MG tablet Take 40 mg by mouth daily.    [provider]  fluticasone  (FLONASE ) 50 MCG/ACT nasal spray Place 2 sprays into both nostrils 3 (three) times daily as needed for allergies or rhinitis.    [provider]  furosemide  (LASIX ) 40 MG tablet TAKE 1 TABLET BY MOUTH EVERY  OTHER DAY 07/10/23   Medina-Vargas, Monina C, NP  glucose blood test strip 1 each by Other route daily. [facility] Glucometer 02/14/23   Ngetich, Dinah C, NP  isosorbide  mononitrate (IMDUR ) 30 MG 24 hr tablet TAKE 1 TABLET BY MOUTH DAILY 08/09/23   Medina-Vargas, Monina C, NP  loratadine  (CLARITIN ) 10 MG tablet Take 1 tablet (10 mg total) by mouth daily. Patient taking differently: Take 10 mg by mouth daily as needed for allergies. 09/02/22   Ngetich, Dinah C, NP  mirtazapine  (REMERON  SOL-TAB) 30 MG disintegrating tablet Take 1 tablet (30 mg total) by mouth  at bedtime. 04/19/23   Sherlynn Madden, MD  montelukast  (SINGULAIR ) 10 MG tablet Take 10 mg by mouth daily as needed (for allergies).    [provider]  nitroGLYCERIN  (NITROSTAT ) 0.4 MG SL tablet DISSOLVE 1 TABLET UNDER THE  TONGUE EVERY 5 MINUTES AS NEEDED FOR CHEST PAIN. MAX OF 3 TABLETS IN 15 MINUTES. CALL 911 IF PAIN  PERSISTS. 08/09/23   Medina-Vargas, Monina C, NP  pantoprazole  (PROTONIX ) 40 MG tablet TAKE 1 TABLET BY MOUTH DAILY  BEFORE BREAKFAST 04/18/23   Ngetich, Dinah C, NP  ranolazine  (RANEXA ) 500 MG 12 hr tablet Take 1 tablet (500 mg total) by mouth 2 (two) times daily. 01/03/23   Parthenia Olivia HERO, PA-C  rosuvastatin  (CRESTOR ) 40 MG tablet TAKE 1 TABLET BY MOUTH DAILY 08/09/23   Medina-Vargas, Monina C, NP  sodium chloride  (OCEAN) 0.65 % SOLN nasal spray Place 1 spray into both nostrils as needed for congestion. 09/02/22   Ngetich, Dinah C, NP  TYLENOL  8 HOUR ARTHRITIS PAIN 650 MG CR tablet Take 650-1,300 mg by mouth every 8 (eight) hours as needed for pain.    [provider]  venlafaxine  XR (EFFEXOR -XR) 150 MG 24 hr capsule TAKE 1 CAPSULE BY MOUTH DAILY  WITH BREAKFAST 06/20/23   Ngetich, Dinah C, NP    Current Outpatient Medications  Medication  Sig Dispense Refill   albuterol  (VENTOLIN  HFA) 108 (90 Base) MCG/ACT inhaler Inhale 1 puff into the lungs every 6 (six) hours as needed for wheezing or shortness of breath.     amiodarone  (PACERONE ) 200 MG tablet TAKE 1 TABLET BY MOUTH DAILY 100 tablet 2   apixaban  (ELIQUIS ) 5 MG TABS tablet TAKE 1 TABLET BY MOUTH TWICE  DAILY 180 tablet 3   carvedilol  (COREG ) 6.25 MG tablet TAKE 1 TABLET BY MOUTH TWICE  DAILY 180 tablet 3   cyanocobalamin  (VITAMIN B12) 1000 MCG tablet Take 1 tablet (1,000 mcg total) by mouth daily. 90 tablet 1   famotidine  (PEPCID ) 40 MG tablet Take 40 mg by mouth daily.     fluticasone  (FLONASE ) 50 MCG/ACT nasal spray Place 2 sprays into both nostrils 3 (three) times daily as needed for allergies or  rhinitis.     furosemide  (LASIX ) 40 MG tablet TAKE 1 TABLET BY MOUTH EVERY  OTHER DAY 50 tablet 2   glucose blood test strip 1 each by Other route daily. [facility] Glucometer 100 each 12   isosorbide  mononitrate (IMDUR ) 30 MG 24 hr tablet TAKE 1 TABLET BY MOUTH DAILY 100 tablet 2   loratadine  (CLARITIN ) 10 MG tablet Take 1 tablet (10 mg total) by mouth daily. (Patient taking differently: Take 10 mg by mouth daily as needed for allergies.) 30 tablet 11   mirtazapine  (REMERON  SOL-TAB) 30 MG disintegrating tablet Take 1 tablet (30 mg total) by mouth at bedtime. 90 tablet 3   montelukast  (SINGULAIR ) 10 MG tablet Take 10 mg by mouth daily as needed (for allergies).     nitroGLYCERIN  (NITROSTAT ) 0.4 MG SL tablet DISSOLVE 1 TABLET UNDER THE  TONGUE EVERY 5 MINUTES AS NEEDED FOR CHEST PAIN. MAX OF 3 TABLETS IN 15 MINUTES. CALL 911 IF PAIN  PERSISTS. 75 tablet 4   pantoprazole  (PROTONIX ) 40 MG tablet TAKE 1 TABLET BY MOUTH DAILY  BEFORE BREAKFAST 100 tablet 2   ranolazine  (RANEXA ) 500 MG 12 hr tablet Take 1 tablet (500 mg total) by mouth 2 (two) times daily. 180 tablet 3   rosuvastatin  (CRESTOR ) 40 MG tablet TAKE 1 TABLET BY MOUTH DAILY 100 tablet 2   sodium chloride  (OCEAN) 0.65 % SOLN nasal spray Place 1 spray into both nostrils as needed for congestion. 88 mL 3   TYLENOL  8 HOUR ARTHRITIS PAIN 650 MG CR tablet Take 650-1,300 mg by mouth every 8 (eight) hours as needed for pain.     venlafaxine  XR (EFFEXOR -XR) 150 MG 24 hr capsule TAKE 1 CAPSULE BY MOUTH DAILY  WITH BREAKFAST 90 capsule 3   No current facility-administered medications for this visit.    Allergies  Allergen Reactions   Metformin And Related Other (See Comments)    Metformin caused the patient's legs to become weak      Review of Systems:   General:  normal appetite, + decreased energy, no weight gain, + weight loss, no fever  Cardiac:  no chest pain with exertion, no chest pain at rest, +SOB with mild exertion, + resting  SOB, no PND, no orthopnea, + palpitations, + arrhythmia, + atrial fibrillation, no LE edema, + dizzy spells, no syncope  Respiratory:  + shortness of breath, + home oxygen , no productive cough, no dry cough, no bronchitis, no wheezing, no hemoptysis, no asthma, no pain with inspiration or cough, + sleep apnea, + CPAP at night  GI:   no difficulty swallowing, no reflux, no frequent heartburn, no hiatal hernia, no abdominal  pain, no constipation, no diarrhea, no hematochezia, no hematemesis, no melena  GU:   no dysuria,  no frequency, no urinary tract infection, no hematuria, no enlarged prostate, no kidney stones, + chronic kidney disease  Vascular:  no pain suggestive of claudication, no pain in feet, no leg cramps, no varicose veins, no DVT, no non-healing foot ulcer  Neuro:   no stroke, no TIA's, no seizures, no headaches, no temporary blindness one eye,  no slurred speech, no peripheral neuropathy, no chronic pain, + instability of gait, no memory/cognitive dysfunction  Musculoskeletal: + arthritis - primarily involving the spine, no joint swelling, no myalgias, + difficulty walking, + reduced mobility   Skin:   + rash, + itching, no skin infections, no pressure sores or ulcerations  Psych:   no anxiety, + depression, no nervousness, no unusual recent stress  Eyes:   no blurry vision, no floaters, no recent vision changes, + wears glasses   ENT:   No hearing loss, + loose or painful teeth, no dentures, last saw dentist 01/07/2024  Hematologic:  no easy bruising, no abnormal bleeding, no clotting disorder, no frequent epistaxis  Endocrine:  + diabetes, does check CBG's at home     Physical Exam:   BP 104/71 (BP Location: Left Arm)   Pulse 60   Resp 18   Ht 5' 6 (1.676 m)   Wt 218 lb (98.9 kg)   SpO2 93%   BMI 35.19 kg/m   General:  Obese gentleman,  well-appearing  HEENT:  Unremarkable, NCAT, PERLA, EOMI  Neck:   no JVD, no bruits, no adenopathy   Chest:   clear to auscultation,  symmetrical breath sounds, no wheezes, no rhonchi   CV:   RRR, 3/6 systolic murmur LLSB, no diastolic murmur  Abdomen:  soft, non-tender, no masses   Extremities:  warm, well-perfused, pedalpulses palpable, no lower extremity edema  Rectal/GU  Deferred  Neuro:   Grossly non-focal and symmetrical throughout  Skin:   Clean and dry, no rashes, no breakdown  Diagnostic Tests:      ECHOCARDIOGRAM REPORT       Patient Name:   VITALIY EISENHOUR Date of Exam: 11/06/2023  Medical Rec #:  [id number]     Height:       66.0 in  Accession #:    [id number]    Weight:       226.0 lb  Date of Birth:  1946-01-31    BSA:          2.106 m  Patient Age:    77 years      BP:           128/74 mmHg  Patient Gender: M             HR:           62 bpm.  Exam Location:  [address]   Procedure: 2D Echo, Color Doppler and Cardiac Doppler (Both Spectral and  Color            Flow Doppler were utilized during procedure).   Indications:    Aortic Stenosis I35.0    History:        Patient has prior history of Echocardiogram examinations,  most                 recent 11/16/2022. Arrythmias:Atrial Fibrillation; Risk                  Factors:Hypertension.    Sonographer:  Augustin Seals RDCS  Referring Phys: [id number] CHRISTOPHER L SCHUMANN   IMPRESSIONS     1. Left ventricular ejection fraction, by estimation, is 60 to 65%. The  left ventricle has normal function. The left ventricle has no regional  wall motion abnormalities. There is mild concentric left ventricular  hypertrophy. Left ventricular diastolic  parameters are consistent with Grade II diastolic dysfunction  (pseudonormalization).   2. Right ventricular systolic function is normal. The right ventricular  size is normal. There is normal pulmonary artery systolic pressure.   3. Left atrial size was mildly dilated.   4. The mitral valve is normal in structure. Trivial mitral valve  regurgitation. No evidence of mitral stenosis.   5. The  aortic valve is tricuspid. There is moderate calcification of the  aortic valve. Aortic valve regurgitation is mild. Moderate to severe  aortic valve stenosis. Aortic valve area, by VTI measures 0.94 cm. Aortic  valve mean gradient measures 32.0  mmHg. Aortic valve Vmax measures 3.86 m/s.   6. Aortic dilatation noted. There is mild dilatation of the ascending  aorta, measuring 41 mm.   7. The inferior vena cava is normal in size with greater than 50%  respiratory variability, suggesting right atrial pressure of 3 mmHg.   FINDINGS   Left Ventricle: Left ventricular ejection fraction, by estimation, is 60  to 65%. The left ventricle has normal function. The left ventricle has no  regional wall motion abnormalities. The left ventricular internal cavity  size was normal in size. There is   mild concentric left ventricular hypertrophy. Left ventricular diastolic  parameters are consistent with Grade II diastolic dysfunction  (pseudonormalization).   Right Ventricle: The right ventricular size is normal. No increase in  right ventricular wall thickness. Right ventricular systolic function is  normal. There is normal pulmonary artery systolic pressure. The tricuspid  regurgitant velocity is 2.37 m/s, and   with an assumed right atrial pressure of 3 mmHg, the estimated right  ventricular systolic pressure is 25.5 mmHg.   Left Atrium: Left atrial size was mildly dilated.   Right Atrium: Right atrial size was normal in size.   Pericardium: There is no evidence of pericardial effusion.   Mitral Valve: The mitral valve is normal in structure. Mild mitral annular  calcification. Trivial mitral valve regurgitation. No evidence of mitral  valve stenosis.   Tricuspid Valve: The tricuspid valve is normal in structure. Tricuspid  valve regurgitation is mild . No evidence of tricuspid stenosis.   Aortic Valve: The aortic valve is tricuspid. There is moderate  calcification of the aortic valve.  Aortic valve regurgitation is mild.  Moderate to severe aortic stenosis is present. Aortic valve mean gradient  measures 32.0 mmHg. Aortic valve peak gradient   measures 59.6 mmHg. Aortic valve area, by VTI measures 0.94 cm.   Pulmonic Valve: The pulmonic valve was normal in structure. Pulmonic valve  regurgitation is trivial. No evidence of pulmonic stenosis.   Aorta: Aortic dilatation noted. There is mild dilatation of the ascending  aorta, measuring 41 mm.   Venous: The inferior vena cava is normal in size with greater than 50%  respiratory variability, suggesting right atrial pressure of 3 mmHg.   IAS/Shunts: No atrial level shunt detected by color flow Doppler.     LEFT VENTRICLE  PLAX 2D  LVIDd:         5.00 cm     Diastology  LVIDs:         3.20 cm  LV e' medial:    4.73 cm/s  LV PW:         1.20 cm     LV E/e' medial:  15.6  LV IVS:        1.30 cm     LV e' lateral:   6.66 cm/s  LVOT diam:     2.00 cm     LV E/e' lateral: 11.1  LV SV:         66  LV SV Index:   31  LVOT Area:     3.14 cm    LV Volumes (MOD)  LV vol d, MOD A2C: 49.8 ml  LV vol d, MOD A4C: 53.3 ml  LV vol s, MOD A2C: 22.4 ml  LV vol s, MOD A4C: 24.3 ml  LV SV MOD A2C:     27.4 ml  LV SV MOD A4C:     53.3 ml  LV SV MOD BP:      31.7 ml   RIGHT VENTRICLE            IVC  RV Basal diam:  3.70 cm    IVC diam: 1.70 cm  RV Mid diam:    3.10 cm  RV S prime:     8.15 cm/s  TAPSE (M-mode): 1.8 cm   LEFT ATRIUM             Index        RIGHT ATRIUM           Index  LA diam:        4.40 cm 2.09 cm/m   RA Area:     13.70 cm  LA Vol (A2C):   77.9 ml 36.98 ml/m  RA Volume:   28.60 ml  13.58 ml/m  LA Vol (A4C):   47.1 ml 22.36 ml/m  LA Biplane Vol: 63.0 ml 29.91 ml/m   AORTIC VALVE  AV Area (Vmax):    0.85 cm  AV Area (Vmean):   0.81 cm  AV Area (VTI):     0.94 cm  AV Vmax:           386.00 cm/s  AV Vmean:          254.000 cm/s  AV VTI:            0.705 m  AV Peak Grad:      59.6 mmHg  AV  Mean Grad:      32.0 mmHg  LVOT Vmax:         105.00 cm/s  LVOT Vmean:        65.700 cm/s  LVOT VTI:          0.211 m  LVOT/AV VTI ratio: 0.30    AORTA  Ao Root diam: 3.30 cm  Ao Asc diam:  4.10 cm   MITRAL VALVE               TRICUSPID VALVE  MV Area (PHT): 3.53 cm    TR Peak grad:   22.5 mmHg  MV Decel Time: 215 msec    TR Vmax:        237.00 cm/s  MV E velocity: 73.90 cm/s  MV A velocity: 53.00 cm/s  SHUNTS  MV E/A ratio:  1.39        Systemic VTI:  0.21 m                             Systemic  Diam: 2.00 cm   Toribio Fuel MD  Electronically signed by Toribio Fuel MD  Signature Date/Time: 11/06/2023/11:40:56 AM     Physicians  Panel Physicians Referring Physician Case Authorizing Physician  Wendel Lurena POUR, MD (Primary)     Procedures  RIGHT/LEFT HEART CATH AND CORONARY/GRAFT ANGIOGRAPHY   Conclusion      Dist Cx lesion is 100% stenosed.   Prox RCA to Mid RCA lesion is 10% stenosed.   Origin to Prox Graft lesion before 1st Mrg  is 100% stenosed.   1st Mrg lesion is 25% stenosed.   Non-stenotic Mid Cx to Dist Cx lesion was previously treated.   and is small.   1.  Patent left circumflex and right coronary artery stents with mild to moderate diffuse disease elsewhere. 2.  Vein graft to obtuse marginal 1 and 2 is known occluded and was not imaged. 3.  LIMA to LAD is known atretic and was not imaged.  Of note there is competitive flow seen in the distal LAD. 4.  Fick cardiac output of 5.5 L/min and Fick cardiac index of 2.7 L/min/m with the following hemodynamics:            Right atrial pressure mean of 1 to 2 mmHg            Right ventricular pressure 27/0 with an end-diastolic pressure of 7 mmHg            Wedge pressure mean of 13 mmHg with V waves to 12 mmHg            PA pressure of 33/18 with mean of 24 mmHg            PVR of 2 Woods units            PA pulsatility index of greater than 4      Recommendation: Continue evaluation for aortic valve  intervention.     Indications  Nonrheumatic aortic valve stenosis [I35.0 (ICD-10-CM)]   Procedural Details  Technical Details The patient is a 78 year old male with a history of severe symptomatic aortic stenosis, coronary artery disease status post PCI of the right coronary artery and left circumflex followed by CABG consisting of a LIMA to LAD and vein graft to obtuse marginal.  Of note the LIMA to LAD is known atretic and the vein graft to obtuse marginal is known occluded.  He also has a history of paroxysmal atrial fibrillation on Eliquis , sick sinus syndrome status post permanent pacemaker, diet-controlled type 2 diabetes, hyperlipidemia, and CKD stage IIIb.  He is referred for preprocedural assessment prior to aortic valve intervention.  The patient was brought to the cardiac catheterization laboratory and prepped draped sterile fashion.  Xylocaine  was used to anesthetize the right wrist and a 6 French Terumo glide sheath was placed.  5000 units heparin  and 5 mg verapamil  administered through the sheath.  Ultrasound was used to gain access to the right internal jugular vein.  A 7 French sheath was placed.  Right heart catheterization was performed with a 7 [place] balloontipped catheter.  Angiography of the left coronary artery was performed with a 6 [place] TIG catheter and angiography of the right coronary artery was performed with the 5 [place] JR4 catheter.  After review of the angiographic images and hemodynamic data, no further interventions were pursued.  A TR band was placed and manual pressure applied to the IJ site.  There were no acute complications. Estimated blood loss <50 mL.   During this procedure  medications were administered to achieve and maintain moderate conscious sedation while the patient's heart rate, blood pressure, and oxygen  saturation were continuously monitored and I was present face-to-face 100% of this time. Logan Boothe Rad Tech and [place] RN are  independent, trained observers who assisted in the monitoring of the patient's level of consciousness.   Medications (Filter: Administrations occurring from 1011 to 1121 on 12/21/23) midazolam  (VERSED ) injection (mg)  Total dose: 1 mg Date/Time Rate/Dose/Volume Action   12/21/23 1026 1 mg Given   fentaNYL  (SUBLIMAZE ) injection (mcg)  Total dose: 25 mcg Date/Time Rate/Dose/Volume Action   12/21/23 1027 25 mcg Given   Heparin  (Porcine) in NaCl 1000-0.9 UT/500ML-% SOLN (mL)  Total volume: 1,000 mL Date/Time Rate/Dose/Volume Action   12/21/23 1029 500 mL Given   1029 500 mL Given   lidocaine  (PF) (XYLOCAINE ) 1 % injection (mL)  Total volume: 4 mL Date/Time Rate/Dose/Volume Action   12/21/23 1033 2 mL Given   1038 2 mL Given   Radial Cocktail/Verapamil  only (mL)  Total volume: 10 mL Date/Time Rate/Dose/Volume Action   12/21/23 1039 10 mL Given   heparin  sodium (porcine) injection (Units)  Total dose: 5,000 Units Date/Time Rate/Dose/Volume Action   12/21/23 1040 5,000 Units Given   iohexol  (OMNIPAQUE ) 350 MG/ML injection (mL)  Total volume: 40 mL Date/Time Rate/Dose/Volume Action   12/21/23 1102 40 mL Given    Sedation Time  Sedation Time Physician-1: 33 minutes 44 seconds Contrast     Administrations occurring from 1011 to 1121 on 12/21/23:  Medication Name Total Dose  iohexol  (OMNIPAQUE ) 350 MG/ML injection 40 mL   Radiation/Fluoro  Fluoro time: 4.8 (min) DAP: 22585 (mGycm2) Cumulative Air Kerma: 276 (mGy) Complications  Complications documented before study signed (12/21/2023 11:22 AM)   No complications were associated with this study.  Documented by Shauna Dallas SQUIBB, RN - 12/21/2023 11:08 AM     Coronary Findings  Diagnostic Dominance: Right Left Anterior Descending  There is mild diffuse disease throughout the vessel.    Left Circumflex  Collaterals  Dist Cx filled by collaterals from 3rd RPL.    Non-stenotic Mid Cx to Dist Cx lesion was previously  treated.  Dist Cx lesion is 100% stenosed. The lesion is chronically occluded.    First Obtuse Marginal Branch  Vessel is large in size. The vessel exhibits minimal luminal irregularities.  1st Mrg lesion is 25% stenosed.    Third Obtuse Marginal Branch  Collaterals  3rd Mrg filled by collaterals from 3rd RPL.      Right Coronary Artery  Prox RCA to Mid RCA lesion is 10% stenosed. The lesion was previously treated .    LIMA Graft To Dist LAD  And is small.    Sequential Graft To 1st Mrg, 3rd Mrg  Origin to Prox Graft lesion before 1st Mrg is 100% stenosed.    Intervention   No interventions have been documented.   Coronary Diagrams  Diagnostic Dominance: Right  Intervention   Implants   No implant documentation for this case.   Syngo Images   Show images for CARDIAC CATHETERIZATION Images on Long Term Storage   Show images for Pease, Rayne Loiseau to Procedure Log  Procedure Log    Hemo Data  Flowsheet Row Most Recent Value  Fick Cardiac Output 5.53 L/min  Fick Cardiac Output Index 2.66 (L/min)/BSA  RA A Wave 4 mmHg  RA V Wave 3 mmHg  RA Mean -1 mmHg  RV Systolic Pressure 27 mmHg  RV Diastolic Pressure 0  mmHg  RV EDP 7 mmHg  PA Systolic Pressure 33 mmHg  PA Diastolic Pressure 18 mmHg  PA Mean 24 mmHg  PW A Wave 11 mmHg  PW V Wave 12 mmHg  PW Mean 13 mmHg  AO Systolic Pressure 105 mmHg  AO Diastolic Pressure 63 mmHg  AO Mean 78 mmHg  QP/QS 1  TPVR Index 9.01 HRUI  TSVR Index 29.29 HRUI  TPVR/TSVR Ratio 0.31   Narrative & Impression  CLINICAL DATA:  89M with severe aortic stenosis being evaluated for a TAVR procedure.   EXAM: Cardiac TAVR CT   TECHNIQUE: A non-contrast, gated CT scan was obtained with axial slices of 2.5 mm through the heart for aortic valve scoring. A 120 kV retrospective, gated, contrast cardiac scan was obtained. Gantry rotation speed was 230 msec and collimation was 0.63 mm. Nitroglycerin  was not given. A delayed scan  was obtained to exclude left atrial appendage thrombus. The 3D dataset was reconstructed in systole with motion correction. The 3D data set was reconstructed in 5% intervals of the 0-95% of the R-R cycle. Systolic and diastolic phases were analyzed on a dedicated workstation using MPR, MIP, and VRT modes. The patient received 100 cc of contrast.   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   Aortic valve calcium  score: 2012   Aortic annulus:   Diameter: 24mm x 19mm   Perimeter: 70mm   Area: 379 mm^2   Calcifications: Mild calcification adjacent to right cusp   Coronary height: Min Left - 10mm; Min Right - 15mm   Sinotubular height: Left cusp - 19mm; Right cusp -21mm; Noncoronary cusp - 22mm   LVOT (as measured 3 mm below the annulus):   Diameter: 26mm x 20mm   Area: 361mm^2   Calcifications: No calcifications   Aortic sinus width: Left cusp - 31mm; Right cusp - 29mm; Noncoronary cusp - 32mm   Sinotubular junction width: 30mm x 27mm   Optimum Fluoroscopic Angle for Delivery: RAO 15 CRA 10   Cardiac:   Right atrium: Mild enlargement   Right ventricle: Normal size   Pulmonary arteries: Normal size   Pulmonary veins: Normal configuration   Left atrium: Mild enlargement   Left ventricle: Normal size   Pericardium: Normal thickness   Coronary arteries: Normal origin. S/p CABG with small LIMA-LAD, appears patent. Occluded SVG-OM   IMPRESSION: 1. 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 Severe aortic valve calcifications (AV calcium  score 2012)   2. Aortic annulus measures 24mm x 19mm in diameter with perimeter 70mm and area 379 mm^2. Mild annular calcifications adjacent to right coronary cusp. Annular measurements are suitable for delivery of 23mm Edwards Sapien 3 valve   3. Low coronary height for left main,  measures 10mm. Sufficient coronary height for RCA, measures 15mm.   4. Optimum Fluoroscopic Angle for Delivery:  RAO 15 CRA 10   5. S/p CABG with small LIMA-LAD, appears patent.  Occluded SVG-OM     Electronically Signed   By: Lonni Nanas M.D.   On: 01/15/2024 13:58   Narrative & Impression  CLINICAL DATA:  Aortic valve replacement (TAVR), pre-op eval   EXAM: CT ANGIOGRAPHY CHEST, ABDOMEN AND PELVIS   TECHNIQUE: Non-contrast CT of the chest was initially obtained.   Multidetector CT imaging through the chest, abdomen and pelvis was performed using the standard protocol during bolus administration of intravenous contrast. Multiplanar reconstructed images  and MIPs were obtained and reviewed to evaluate the vascular anatomy.   RADIATION DOSE REDUCTION: This exam was performed according to the departmental dose-optimization program which includes automated exposure control, adjustment of the mA and/or kV according to patient size and/or use of iterative reconstruction technique.   CONTRAST:  OMNIPAQUE  IOHEXOL  350 MG/ML SOLN   COMPARISON:  None Available.   FINDINGS: CTA CHEST FINDINGS   Cardiovascular: Preferential opacification of the thoracic aorta. No evidence of thoracic aortic dissection. Enlarged ascending thoracic aorta with caliber up to 4 cm. Descending thoracic aorta is normal in caliber. Normal heart size. No significant pericardial effusion. Mild atherosclerotic plaque of the thoracic aorta. At least 3 vessel coronary artery calcifications. Aortic valve leaflet calcifications. The main pulmonary artery is normal in caliber. Two lead cardiac pacemaker.   Mediastinum/Nodes: No enlarged mediastinal, hilar, or axillary lymph nodes. Thyroid  gland, trachea, and esophagus demonstrate no significant findings.   Lungs/Pleura: Paraseptal emphysematous changes. No focal consolidation. No pulmonary nodule. No pulmonary mass. No pleural effusion. No  pneumothorax.   Musculoskeletal:   No chest wall abnormality.   No suspicious lytic or blastic osseous lesions. Old healed left rib fractures. No acute displaced fracture. Multilevel degenerative changes of the spine. Severe degenerative changes of the right shoulder. Partially visualized left shoulder arthroplasty.   Review of the MIP images confirms the above findings.   CTA ABDOMEN AND PELVIS FINDINGS   VASCULAR   Aorta: Normal caliber aorta without aneurysm, dissection, vasculitis or significant stenosis.   Celiac: Patent without evidence of aneurysm, dissection, vasculitis or significant stenosis.   SMA: Patent without evidence of aneurysm, dissection, vasculitis or significant stenosis.   Renals: Both renal arteries are patent without evidence of aneurysm, dissection, vasculitis, fibromuscular dysplasia or significant stenosis.   IMA: Patent without evidence of aneurysm, dissection, vasculitis or significant stenosis.   Inflow: Patent without evidence of aneurysm, dissection, vasculitis or significant stenosis.   Veins: No obvious venous abnormality within the limitations of this arterial phase study.   Review of the MIP images confirms the above findings.   NON-VASCULAR   Hepatobiliary: No focal liver abnormality. Calcified gallstone noted within the gallbladder lumen. No gallbladder wall thickening or pericholecystic fluid. No biliary dilatation.   Pancreas: No focal lesion. Normal pancreatic contour. No surrounding inflammatory changes. No main pancreatic ductal dilatation.   Spleen: Normal in size without focal abnormality.   Adrenals/Urinary Tract:   No adrenal nodule bilaterally.   Bilateral renal cortical scarring. Bilateral kidneys enhance symmetrically.   No hydronephrosis. No hydroureter. Pericentimeter right simple renal cyst. Simple renal cysts, in the absence of clinically indicated signs/symptoms, require no independent follow-up.    The urinary bladder is unremarkable.   Stomach/Bowel: Stomach is within normal limits. No evidence of bowel wall thickening or dilatation. Colonic diverticulosis. The appendix is not definitely identified with no inflammatory changes in the right lower quadrant to suggest acute appendicitis.   Lymphatic: No lymphadenopathy.   Reproductive: Prostate is unremarkable.   Other: No intraperitoneal free fluid. No intraperitoneal free gas. No organized fluid collection.   Musculoskeletal: Small supraumbilical Richter hernia containing the anti mesenteric wall of a short segment of small bowel. Small fat containing umbilical hernia.   No suspicious lytic or blastic osseous lesions. No acute displaced fracture. Multilevel degenerative changes of the spine.   T11-S1 posterolateral and interbody surgical hardware. Multilevel severe degenerative changes of the spine.   Review of the MIP images confirms the above findings.   IMPRESSION: 1. Aneurysmal ascending  thoracic aorta (4 cm). Recommend annual imaging followup by CTA or MRA. This recommendation follows 2010 ACCF/AHA/AATS/ACR/ASA/SCA/SCAI/SIR/STS/SVM Guidelines for the Diagnosis and Management of Patients with Thoracic Aortic Disease. Circulation. 2010; 121: [id number]. Aortic aneurysm NOS (ICD10-I71.9). 2. No dissection of the thoracic or abdominal aorta. 3. No aneurysm of the abdominal aorta. 4. Aortic Atherosclerosis (ICD10-I70.0) - including coronary artery and aortic valve leaflet calcifications-correlate with aortic stenosis. 5. Cholelithiasis with no acute cholecystitis. 6. Colonic diverticulosis with no acute diverticulitis. 7. 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 8.  Emphysema (ICD10-J43.9).     Electronically Signed   By: Morgane  Naveau M.D.   On: 01/16/2024 20:42   Impression:  This 78 year old gentleman has stage D, severe, symptomatic  aortic stenosis with NYHA class IIl symptoms of exertional fatigue and shortness of breath consistent with chronic diastolic congestive heart failure.  He has also been having frequent episodes of dizziness and lightheadedness.  I have personally reviewed his echo, cath, and CTA images.  His echo shows a moderately calcified aortic valve with restricted leaflet mobility.  The mean gradient was measured at 32 mmHg with an aortic valve area of 0.94 cm by VTI.  Left ventricular ejection fraction was 60 to 65% with a stroke-volume index of 31.  Cardiac catheterization showed patent left circumflex and right coronary stents.  The vein graft to the obtuse marginal 1 and 2 was known to be occluded.  The LIMA to the LAD is known to be a atretic and was not imaged.  There was competitive flow seen in the distal LAD.  Gated coronary CTA shows a trileaflet but functionally bicuspid aortic valve with partial fusion of the left and right cusps.  The valve looks severely stenotic with an aortic valve calcium  score of 2012.  The small LIMA to the LAD appears patent.  There was an occluded saphenous vein graft to the obtuse marginal branches  I agree that aortic valve replacement is indicated in this patient for relief of his worsening symptoms and to prevent progressive left ventricular dysfunction.  His gated cardiac CTA shows anatomy suitable for TAVR using a 23 mm Edwards SAPIEN 3 valve or a 26 mm Medtronic valve.  Given his BMI of 37 with a BSA of 2.1 I think a Medtronic valve would be the best option for him.  His abdominal and pelvic CTA shows adequate pelvic vascular anatomy to allow transfemoral insertion on the right side.  The patient was counseled at length regarding treatment alternatives for management of severe symptomatic aortic stenosis. The risks and benefits of surgical intervention has been discussed in detail. Long-term prognosis with medical therapy was discussed. Alternative approaches such as  conventional surgical aortic valve replacement, transcatheter aortic valve replacement, and palliative medical therapy were compared and contrasted at length. This discussion was placed in the context of the patient's own specific clinical presentation and past medical history. All of his questions have been addressed.   Following the decision to proceed with transcatheter aortic valve replacement, a discussion was held regarding what types of management strategies would be attempted intraoperatively in the event of life-threatening complications, including whether or not the patient would be considered a candidate for the use of cardiopulmonary bypass and/or conversion to open sternotomy for attempted surgical intervention.  Given his age, prior coronary bypass surgery, and comorbidities I do not think he is a candidate for emergent sternotomy to manage any intraoperative complications.  The patient has been  advised of a variety of complications that might develop including but not limited to risks of death, stroke, paravalvular leak, aortic dissection or other major vascular complications, aortic annulus rupture, device embolization, cardiac rupture or perforation, mitral regurgitation, acute myocardial infarction, arrhythmia, heart block or bradycardia requiring permanent pacemaker placement, congestive heart failure, respiratory failure, renal failure, pneumonia, infection, other late complications related to structural valve deterioration or migration, or other complications that might ultimately cause a temporary or permanent loss of functional independence or other long term morbidity. The patient provides full informed consent for the procedure as described and all questions were answered.      Plan:  He will be scheduled for transfemoral TAVR using a Medtronic Evolute FX valve on 01/30/2024.  His Eliquis  will need to be stopped at least 2 days ahead of time.  I spent 60 minutes performing this  consultation and > 50% of this time was spent face to face counseling and coordinating the care of this patient's severe symptomatic aortic stenosis.   Dorise LOIS Fellers, MD 01/23/2024

## 2024-01-23 NOTE — H&P (View-Only) (Signed)
 Patient ID: Johnny [name], male   DOB: 1946-01-26, 78 y.o.   MRN: [id number]  [facility]   MULTIDISCIPLINARY HEART VALVE CLINIC   CARDIOTHORACIC SURGERY CONSULTATION REPORT  PCP is Medina-Vargas, Jereld BROCKS, NP Referring Provider is Lurena Red, MD Primary Cardiologist is Johnny LITTIE Nanas, MD  Reason for consultation: Severe aortic stenosis  HPI:  The patient is a 78 year old gentleman with a history of hypertension, hyperlipidemia, morbid obesity, type 2 diabetes, stage IIIb CKD, PAF on Eliquis , sick sinus syndrome status post permanent pacemaker, coronary artery disease status post PCI of the RCA and left circumflex with CABG in 2015, and severe aortic stenosis who was referred for consideration of TAVR.  His most recent echocardiogram on 11/06/2023 showed a trileaflet aortic valve with moderate calcification.  The mean gradient was 32 mmHg with a peak of 59.6 mmHg.  Aortic valve area by VTI was 0.94 cm with a dimensionless index of 0.3.  Left ventricular ejection fraction was 60 to 65% with grade 2 diastolic dysfunction.  Stroke-volume index was low at 31.  He presents with worsening exertional shortness of breath and fatigue and has also been having frequent episodes of lightheadedness and dizziness with position changes.  He is now getting short of breath with walking in his house and had to take several breaks walking into my office today.  He loves to sing but has not had the wind to be able to sing over the past several months.  He denies peripheral edema.  He has had no orthopnea or PND.  Denies any chest pain.  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[place], [place]    Past Surgical History:  Procedure Laterality Date   BACK SURGERY  1975   Lower Back Surgery, Dr.Seltzer   BACK SURGERY  1985   Dr.Seltzer   LEFT HEART CATH AND CORS/GRAFTS  ANGIOGRAPHY N/A 11/17/2022   Procedure: LEFT HEART CATH AND CORS/GRAFTS ANGIOGRAPHY;  Surgeon: Dann Candyce RAMAN, MD;  Location: [facility];  Service: Cardiovascular;  Laterality: N/A;   RIGHT/LEFT HEART CATH AND CORONARY/GRAFT ANGIOGRAPHY N/A 12/21/2023   Procedure: RIGHT/LEFT HEART CATH AND CORONARY/GRAFT ANGIOGRAPHY;  Surgeon: Red Lurena POUR, MD;  Location: [facility];  Service: Cardiovascular;  Laterality: N/A;   SHOULDER SURGERY Left 1965   Dr.Seltzer   TONSILLECTOMY  1950   Dr.Perellie    Family History  Problem Relation Age of Onset   Cancer Father    Stroke Sister    Stroke Brother        x 2    Social History   Socioeconomic History   Marital status: Widowed    Spouse name: Not on file   Number of children: Not on file   Years of education: Not on file   Highest education level: 12th grade  Occupational History   Not on file  Tobacco Use   Smoking status: Former    Current packs/day: 1.00    Average packs/day: 1 pack/day for 10.0 years (10.0 ttl pk-yrs)    Types: Cigarettes   Smokeless tobacco: Never   Tobacco comments:    Quit at age 78  Vaping Use   Vaping status: Never Used  Substance and Sexual Activity   Alcohol use: Yes    Alcohol/week: 1.0 standard drink of alcohol    Types: 1 Glasses  of wine per week    Comment: occasion   Drug use: Never   Sexual activity: Not on file  Other Topics Concern   Not on file  Social History Narrative   Tobacco use, amount per day now: None   Past tobacco use, amount per day: 1 pack   How many years did you use tobacco: 10 years.   Alcohol use (drinks per week): None   Diet:   Do you drink/eat things with caffeine: Yes   Marital status:  Widow                                What year were you married? 1972   Do you live in a house, apartment, assisted living, condo, trailer, etc.? House   Is it one or more stories? One   How many persons live in your home? One   Do you have pets in your home?(  please list) No   Highest Level of education completed? 12th grade.   Current or past profession: Psychologist, occupational, Investment banker, operational, Clinical biochemist.    Do you exercise?   No                               Type and how often?   Do you have a living will? Yes   Do you have a DNR form?     No                              If not, do you want to discuss one?   Do you have signed POA/HPOA forms?  Yes                      If so, please bring to you appointment      Do you have any difficulty bathing or dressing yourself? No   Do you have any difficulty preparing food or eating? No   Do you have any difficulty managing your medications? No   Do you have any difficulty managing your finances? Yes   Do you have any difficulty affording your medications? No   Social Drivers of Corporate investment banker Strain: High Risk (07/02/2023)   Overall Financial Resource Strain (CARDIA)    Difficulty of Paying Living Expenses: Hard  Food Insecurity: Food Insecurity Present (07/02/2023)   Hunger Vital Sign    Worried About Running Out of Food in the Last Year: Sometimes true    Ran Out of Food in the Last Year: Sometimes true  Transportation Needs: Unmet Transportation Needs (07/02/2023)   PRAPARE - Administrator, Civil Service (Medical): Yes    Lack of Transportation (Non-Medical): No  Physical Activity: Unknown (07/02/2023)   Exercise Vital Sign    Days of Exercise per Week: 0 days    Minutes of Exercise per Session: Not on file  Stress: Stress Concern Present (07/02/2023)   [employer] of Occupational Health - Occupational Stress Questionnaire    Feeling of Stress : To some extent  Social Connections: Moderately Isolated (07/02/2023)   Social Connection and Isolation Panel    Frequency of Communication with Friends and Family: More than three times a week    Frequency of Social Gatherings with Friends and Family: Never    Attends Religious Services: More than 4 times  per year    Active Member of Clubs  or Organizations: No    Attends Banker Meetings: Not on file    Marital Status: Widowed  Intimate Partner Violence: Not At Risk (11/17/2022)   Humiliation, Afraid, Rape, and Kick questionnaire    Fear of Current or Ex-Partner: No    Emotionally Abused: No    Physically Abused: No    Sexually Abused: No    Prior to Admission medications   Medication Sig Start Date End Date Taking? Authorizing Provider  albuterol  (VENTOLIN  HFA) 108 (90 Base) MCG/ACT inhaler Inhale 1 puff into the lungs every 6 (six) hours as needed for wheezing or shortness of breath.    [provider]  amiodarone  (PACERONE ) 200 MG tablet TAKE 1 TABLET BY MOUTH DAILY 08/09/23   Medina-Vargas, Monina C, NP  apixaban  (ELIQUIS ) 5 MG TABS tablet TAKE 1 TABLET BY MOUTH TWICE  DAILY 12/18/23   Medina-Vargas, Monina C, NP  carvedilol  (COREG ) 6.25 MG tablet TAKE 1 TABLET BY MOUTH TWICE  DAILY 12/18/23   Medina-Vargas, Monina C, NP  cyanocobalamin  (VITAMIN B12) 1000 MCG tablet Take 1 tablet (1,000 mcg total) by mouth daily. 11/05/22   Camara, Amadou, MD  famotidine  (PEPCID ) 40 MG tablet Take 40 mg by mouth daily.    [provider]  fluticasone  (FLONASE ) 50 MCG/ACT nasal spray Place 2 sprays into both nostrils 3 (three) times daily as needed for allergies or rhinitis.    [provider]  furosemide  (LASIX ) 40 MG tablet TAKE 1 TABLET BY MOUTH EVERY  OTHER DAY 07/10/23   Medina-Vargas, Monina C, NP  glucose blood test strip 1 each by Other route daily. [facility] Glucometer 02/14/23   Ngetich, Dinah C, NP  isosorbide  mononitrate (IMDUR ) 30 MG 24 hr tablet TAKE 1 TABLET BY MOUTH DAILY 08/09/23   Medina-Vargas, Monina C, NP  loratadine  (CLARITIN ) 10 MG tablet Take 1 tablet (10 mg total) by mouth daily. Patient taking differently: Take 10 mg by mouth daily as needed for allergies. 09/02/22   Ngetich, Dinah C, NP  mirtazapine  (REMERON  SOL-TAB) 30 MG disintegrating tablet Take 1 tablet (30 mg total) by mouth  at bedtime. 04/19/23   Sherlynn Madden, MD  montelukast  (SINGULAIR ) 10 MG tablet Take 10 mg by mouth daily as needed (for allergies).    [provider]  nitroGLYCERIN  (NITROSTAT ) 0.4 MG SL tablet DISSOLVE 1 TABLET UNDER THE  TONGUE EVERY 5 MINUTES AS NEEDED FOR CHEST PAIN. MAX OF 3 TABLETS IN 15 MINUTES. CALL 911 IF PAIN  PERSISTS. 08/09/23   Medina-Vargas, Monina C, NP  pantoprazole  (PROTONIX ) 40 MG tablet TAKE 1 TABLET BY MOUTH DAILY  BEFORE BREAKFAST 04/18/23   Ngetich, Dinah C, NP  ranolazine  (RANEXA ) 500 MG 12 hr tablet Take 1 tablet (500 mg total) by mouth 2 (two) times daily. 01/03/23   Parthenia Olivia HERO, PA-C  rosuvastatin  (CRESTOR ) 40 MG tablet TAKE 1 TABLET BY MOUTH DAILY 08/09/23   Medina-Vargas, Monina C, NP  sodium chloride  (OCEAN) 0.65 % SOLN nasal spray Place 1 spray into both nostrils as needed for congestion. 09/02/22   Ngetich, Dinah C, NP  TYLENOL  8 HOUR ARTHRITIS PAIN 650 MG CR tablet Take 650-1,300 mg by mouth every 8 (eight) hours as needed for pain.    [provider]  venlafaxine  XR (EFFEXOR -XR) 150 MG 24 hr capsule TAKE 1 CAPSULE BY MOUTH DAILY  WITH BREAKFAST 06/20/23   Ngetich, Dinah C, NP    Current Outpatient Medications  Medication  Sig Dispense Refill   albuterol  (VENTOLIN  HFA) 108 (90 Base) MCG/ACT inhaler Inhale 1 puff into the lungs every 6 (six) hours as needed for wheezing or shortness of breath.     amiodarone  (PACERONE ) 200 MG tablet TAKE 1 TABLET BY MOUTH DAILY 100 tablet 2   apixaban  (ELIQUIS ) 5 MG TABS tablet TAKE 1 TABLET BY MOUTH TWICE  DAILY 180 tablet 3   carvedilol  (COREG ) 6.25 MG tablet TAKE 1 TABLET BY MOUTH TWICE  DAILY 180 tablet 3   cyanocobalamin  (VITAMIN B12) 1000 MCG tablet Take 1 tablet (1,000 mcg total) by mouth daily. 90 tablet 1   famotidine  (PEPCID ) 40 MG tablet Take 40 mg by mouth daily.     fluticasone  (FLONASE ) 50 MCG/ACT nasal spray Place 2 sprays into both nostrils 3 (three) times daily as needed for allergies or  rhinitis.     furosemide  (LASIX ) 40 MG tablet TAKE 1 TABLET BY MOUTH EVERY  OTHER DAY 50 tablet 2   glucose blood test strip 1 each by Other route daily. [facility] Glucometer 100 each 12   isosorbide  mononitrate (IMDUR ) 30 MG 24 hr tablet TAKE 1 TABLET BY MOUTH DAILY 100 tablet 2   loratadine  (CLARITIN ) 10 MG tablet Take 1 tablet (10 mg total) by mouth daily. (Patient taking differently: Take 10 mg by mouth daily as needed for allergies.) 30 tablet 11   mirtazapine  (REMERON  SOL-TAB) 30 MG disintegrating tablet Take 1 tablet (30 mg total) by mouth at bedtime. 90 tablet 3   montelukast  (SINGULAIR ) 10 MG tablet Take 10 mg by mouth daily as needed (for allergies).     nitroGLYCERIN  (NITROSTAT ) 0.4 MG SL tablet DISSOLVE 1 TABLET UNDER THE  TONGUE EVERY 5 MINUTES AS NEEDED FOR CHEST PAIN. MAX OF 3 TABLETS IN 15 MINUTES. CALL 911 IF PAIN  PERSISTS. 75 tablet 4   pantoprazole  (PROTONIX ) 40 MG tablet TAKE 1 TABLET BY MOUTH DAILY  BEFORE BREAKFAST 100 tablet 2   ranolazine  (RANEXA ) 500 MG 12 hr tablet Take 1 tablet (500 mg total) by mouth 2 (two) times daily. 180 tablet 3   rosuvastatin  (CRESTOR ) 40 MG tablet TAKE 1 TABLET BY MOUTH DAILY 100 tablet 2   sodium chloride  (OCEAN) 0.65 % SOLN nasal spray Place 1 spray into both nostrils as needed for congestion. 88 mL 3   TYLENOL  8 HOUR ARTHRITIS PAIN 650 MG CR tablet Take 650-1,300 mg by mouth every 8 (eight) hours as needed for pain.     venlafaxine  XR (EFFEXOR -XR) 150 MG 24 hr capsule TAKE 1 CAPSULE BY MOUTH DAILY  WITH BREAKFAST 90 capsule 3   No current facility-administered medications for this visit.    Allergies  Allergen Reactions   Metformin And Related Other (See Comments)    Metformin caused the patient's legs to become weak      Review of Systems:   General:  normal appetite, + decreased energy, no weight gain, + weight loss, no fever  Cardiac:  no chest pain with exertion, no chest pain at rest, +SOB with mild exertion, + resting  SOB, no PND, no orthopnea, + palpitations, + arrhythmia, + atrial fibrillation, no LE edema, + dizzy spells, no syncope  Respiratory:  + shortness of breath, + home oxygen , no productive cough, no dry cough, no bronchitis, no wheezing, no hemoptysis, no asthma, no pain with inspiration or cough, + sleep apnea, + CPAP at night  GI:   no difficulty swallowing, no reflux, no frequent heartburn, no hiatal hernia, no abdominal  pain, no constipation, no diarrhea, no hematochezia, no hematemesis, no melena  GU:   no dysuria,  no frequency, no urinary tract infection, no hematuria, no enlarged prostate, no kidney stones, + chronic kidney disease  Vascular:  no pain suggestive of claudication, no pain in feet, no leg cramps, no varicose veins, no DVT, no non-healing foot ulcer  Neuro:   no stroke, no TIA's, no seizures, no headaches, no temporary blindness one eye,  no slurred speech, no peripheral neuropathy, no chronic pain, + instability of gait, no memory/cognitive dysfunction  Musculoskeletal: + arthritis - primarily involving the spine, no joint swelling, no myalgias, + difficulty walking, + reduced mobility   Skin:   + rash, + itching, no skin infections, no pressure sores or ulcerations  Psych:   no anxiety, + depression, no nervousness, no unusual recent stress  Eyes:   no blurry vision, no floaters, no recent vision changes, + wears glasses   ENT:   No hearing loss, + loose or painful teeth, no dentures, last saw dentist 01/07/2024  Hematologic:  no easy bruising, no abnormal bleeding, no clotting disorder, no frequent epistaxis  Endocrine:  + diabetes, does check CBG's at home     Physical Exam:   BP 104/71 (BP Location: Left Arm)   Pulse 60   Resp 18   Ht 5' 6 (1.676 m)   Wt 218 lb (98.9 kg)   SpO2 93%   BMI 35.19 kg/m   General:  Obese gentleman,  well-appearing  HEENT:  Unremarkable, NCAT, PERLA, EOMI  Neck:   no JVD, no bruits, no adenopathy   Chest:   clear to auscultation,  symmetrical breath sounds, no wheezes, no rhonchi   CV:   RRR, 3/6 systolic murmur LLSB, no diastolic murmur  Abdomen:  soft, non-tender, no masses   Extremities:  warm, well-perfused, pedalpulses palpable, no lower extremity edema  Rectal/GU  Deferred  Neuro:   Grossly non-focal and symmetrical throughout  Skin:   Clean and dry, no rashes, no breakdown  Diagnostic Tests:      ECHOCARDIOGRAM REPORT       Patient Name:   VITALIY EISENHOUR Date of Exam: 11/06/2023  Medical Rec #:  [id number]     Height:       66.0 in  Accession #:    [id number]    Weight:       226.0 lb  Date of Birth:  1946-01-31    BSA:          2.106 m  Patient Age:    77 years      BP:           128/74 mmHg  Patient Gender: M             HR:           62 bpm.  Exam Location:  [address]   Procedure: 2D Echo, Color Doppler and Cardiac Doppler (Both Spectral and  Color            Flow Doppler were utilized during procedure).   Indications:    Aortic Stenosis I35.0    History:        Patient has prior history of Echocardiogram examinations,  most                 recent 11/16/2022. Arrythmias:Atrial Fibrillation; Risk                  Factors:Hypertension.    Sonographer:  Augustin Seals RDCS  Referring Phys: [id number] CHRISTOPHER L SCHUMANN   IMPRESSIONS     1. Left ventricular ejection fraction, by estimation, is 60 to 65%. The  left ventricle has normal function. The left ventricle has no regional  wall motion abnormalities. There is mild concentric left ventricular  hypertrophy. Left ventricular diastolic  parameters are consistent with Grade II diastolic dysfunction  (pseudonormalization).   2. Right ventricular systolic function is normal. The right ventricular  size is normal. There is normal pulmonary artery systolic pressure.   3. Left atrial size was mildly dilated.   4. The mitral valve is normal in structure. Trivial mitral valve  regurgitation. No evidence of mitral stenosis.   5. The  aortic valve is tricuspid. There is moderate calcification of the  aortic valve. Aortic valve regurgitation is mild. Moderate to severe  aortic valve stenosis. Aortic valve area, by VTI measures 0.94 cm. Aortic  valve mean gradient measures 32.0  mmHg. Aortic valve Vmax measures 3.86 m/s.   6. Aortic dilatation noted. There is mild dilatation of the ascending  aorta, measuring 41 mm.   7. The inferior vena cava is normal in size with greater than 50%  respiratory variability, suggesting right atrial pressure of 3 mmHg.   FINDINGS   Left Ventricle: Left ventricular ejection fraction, by estimation, is 60  to 65%. The left ventricle has normal function. The left ventricle has no  regional wall motion abnormalities. The left ventricular internal cavity  size was normal in size. There is   mild concentric left ventricular hypertrophy. Left ventricular diastolic  parameters are consistent with Grade II diastolic dysfunction  (pseudonormalization).   Right Ventricle: The right ventricular size is normal. No increase in  right ventricular wall thickness. Right ventricular systolic function is  normal. There is normal pulmonary artery systolic pressure. The tricuspid  regurgitant velocity is 2.37 m/s, and   with an assumed right atrial pressure of 3 mmHg, the estimated right  ventricular systolic pressure is 25.5 mmHg.   Left Atrium: Left atrial size was mildly dilated.   Right Atrium: Right atrial size was normal in size.   Pericardium: There is no evidence of pericardial effusion.   Mitral Valve: The mitral valve is normal in structure. Mild mitral annular  calcification. Trivial mitral valve regurgitation. No evidence of mitral  valve stenosis.   Tricuspid Valve: The tricuspid valve is normal in structure. Tricuspid  valve regurgitation is mild . No evidence of tricuspid stenosis.   Aortic Valve: The aortic valve is tricuspid. There is moderate  calcification of the aortic valve.  Aortic valve regurgitation is mild.  Moderate to severe aortic stenosis is present. Aortic valve mean gradient  measures 32.0 mmHg. Aortic valve peak gradient   measures 59.6 mmHg. Aortic valve area, by VTI measures 0.94 cm.   Pulmonic Valve: The pulmonic valve was normal in structure. Pulmonic valve  regurgitation is trivial. No evidence of pulmonic stenosis.   Aorta: Aortic dilatation noted. There is mild dilatation of the ascending  aorta, measuring 41 mm.   Venous: The inferior vena cava is normal in size with greater than 50%  respiratory variability, suggesting right atrial pressure of 3 mmHg.   IAS/Shunts: No atrial level shunt detected by color flow Doppler.     LEFT VENTRICLE  PLAX 2D  LVIDd:         5.00 cm     Diastology  LVIDs:         3.20 cm  LV e' medial:    4.73 cm/s  LV PW:         1.20 cm     LV E/e' medial:  15.6  LV IVS:        1.30 cm     LV e' lateral:   6.66 cm/s  LVOT diam:     2.00 cm     LV E/e' lateral: 11.1  LV SV:         66  LV SV Index:   31  LVOT Area:     3.14 cm    LV Volumes (MOD)  LV vol d, MOD A2C: 49.8 ml  LV vol d, MOD A4C: 53.3 ml  LV vol s, MOD A2C: 22.4 ml  LV vol s, MOD A4C: 24.3 ml  LV SV MOD A2C:     27.4 ml  LV SV MOD A4C:     53.3 ml  LV SV MOD BP:      31.7 ml   RIGHT VENTRICLE            IVC  RV Basal diam:  3.70 cm    IVC diam: 1.70 cm  RV Mid diam:    3.10 cm  RV S prime:     8.15 cm/s  TAPSE (M-mode): 1.8 cm   LEFT ATRIUM             Index        RIGHT ATRIUM           Index  LA diam:        4.40 cm 2.09 cm/m   RA Area:     13.70 cm  LA Vol (A2C):   77.9 ml 36.98 ml/m  RA Volume:   28.60 ml  13.58 ml/m  LA Vol (A4C):   47.1 ml 22.36 ml/m  LA Biplane Vol: 63.0 ml 29.91 ml/m   AORTIC VALVE  AV Area (Vmax):    0.85 cm  AV Area (Vmean):   0.81 cm  AV Area (VTI):     0.94 cm  AV Vmax:           386.00 cm/s  AV Vmean:          254.000 cm/s  AV VTI:            0.705 m  AV Peak Grad:      59.6 mmHg  AV  Mean Grad:      32.0 mmHg  LVOT Vmax:         105.00 cm/s  LVOT Vmean:        65.700 cm/s  LVOT VTI:          0.211 m  LVOT/AV VTI ratio: 0.30    AORTA  Ao Root diam: 3.30 cm  Ao Asc diam:  4.10 cm   MITRAL VALVE               TRICUSPID VALVE  MV Area (PHT): 3.53 cm    TR Peak grad:   22.5 mmHg  MV Decel Time: 215 msec    TR Vmax:        237.00 cm/s  MV E velocity: 73.90 cm/s  MV A velocity: 53.00 cm/s  SHUNTS  MV E/A ratio:  1.39        Systemic VTI:  0.21 m                             Systemic  Diam: 2.00 cm   Toribio Fuel MD  Electronically signed by Toribio Fuel MD  Signature Date/Time: 11/06/2023/11:40:56 AM     Physicians  Panel Physicians Referring Physician Case Authorizing Physician  Wendel Lurena POUR, MD (Primary)     Procedures  RIGHT/LEFT HEART CATH AND CORONARY/GRAFT ANGIOGRAPHY   Conclusion      Dist Cx lesion is 100% stenosed.   Prox RCA to Mid RCA lesion is 10% stenosed.   Origin to Prox Graft lesion before 1st Mrg  is 100% stenosed.   1st Mrg lesion is 25% stenosed.   Non-stenotic Mid Cx to Dist Cx lesion was previously treated.   and is small.   1.  Patent left circumflex and right coronary artery stents with mild to moderate diffuse disease elsewhere. 2.  Vein graft to obtuse marginal 1 and 2 is known occluded and was not imaged. 3.  LIMA to LAD is known atretic and was not imaged.  Of note there is competitive flow seen in the distal LAD. 4.  Fick cardiac output of 5.5 L/min and Fick cardiac index of 2.7 L/min/m with the following hemodynamics:            Right atrial pressure mean of 1 to 2 mmHg            Right ventricular pressure 27/0 with an end-diastolic pressure of 7 mmHg            Wedge pressure mean of 13 mmHg with V waves to 12 mmHg            PA pressure of 33/18 with mean of 24 mmHg            PVR of 2 Woods units            PA pulsatility index of greater than 4      Recommendation: Continue evaluation for aortic valve  intervention.     Indications  Nonrheumatic aortic valve stenosis [I35.0 (ICD-10-CM)]   Procedural Details  Technical Details The patient is a 78 year old male with a history of severe symptomatic aortic stenosis, coronary artery disease status post PCI of the right coronary artery and left circumflex followed by CABG consisting of a LIMA to LAD and vein graft to obtuse marginal.  Of note the LIMA to LAD is known atretic and the vein graft to obtuse marginal is known occluded.  He also has a history of paroxysmal atrial fibrillation on Eliquis , sick sinus syndrome status post permanent pacemaker, diet-controlled type 2 diabetes, hyperlipidemia, and CKD stage IIIb.  He is referred for preprocedural assessment prior to aortic valve intervention.  The patient was brought to the cardiac catheterization laboratory and prepped draped sterile fashion.  Xylocaine  was used to anesthetize the right wrist and a 6 French Terumo glide sheath was placed.  5000 units heparin  and 5 mg verapamil  administered through the sheath.  Ultrasound was used to gain access to the right internal jugular vein.  A 7 French sheath was placed.  Right heart catheterization was performed with a 7 [place] balloontipped catheter.  Angiography of the left coronary artery was performed with a 6 [place] TIG catheter and angiography of the right coronary artery was performed with the 5 [place] JR4 catheter.  After review of the angiographic images and hemodynamic data, no further interventions were pursued.  A TR band was placed and manual pressure applied to the IJ site.  There were no acute complications. Estimated blood loss <50 mL.   During this procedure  medications were administered to achieve and maintain moderate conscious sedation while the patient's heart rate, blood pressure, and oxygen  saturation were continuously monitored and I was present face-to-face 100% of this time. Logan Boothe Rad Tech and [place] RN are  independent, trained observers who assisted in the monitoring of the patient's level of consciousness.   Medications (Filter: Administrations occurring from 1011 to 1121 on 12/21/23) midazolam  (VERSED ) injection (mg)  Total dose: 1 mg Date/Time Rate/Dose/Volume Action   12/21/23 1026 1 mg Given   fentaNYL  (SUBLIMAZE ) injection (mcg)  Total dose: 25 mcg Date/Time Rate/Dose/Volume Action   12/21/23 1027 25 mcg Given   Heparin  (Porcine) in NaCl 1000-0.9 UT/500ML-% SOLN (mL)  Total volume: 1,000 mL Date/Time Rate/Dose/Volume Action   12/21/23 1029 500 mL Given   1029 500 mL Given   lidocaine  (PF) (XYLOCAINE ) 1 % injection (mL)  Total volume: 4 mL Date/Time Rate/Dose/Volume Action   12/21/23 1033 2 mL Given   1038 2 mL Given   Radial Cocktail/Verapamil  only (mL)  Total volume: 10 mL Date/Time Rate/Dose/Volume Action   12/21/23 1039 10 mL Given   heparin  sodium (porcine) injection (Units)  Total dose: 5,000 Units Date/Time Rate/Dose/Volume Action   12/21/23 1040 5,000 Units Given   iohexol  (OMNIPAQUE ) 350 MG/ML injection (mL)  Total volume: 40 mL Date/Time Rate/Dose/Volume Action   12/21/23 1102 40 mL Given    Sedation Time  Sedation Time Physician-1: 33 minutes 44 seconds Contrast     Administrations occurring from 1011 to 1121 on 12/21/23:  Medication Name Total Dose  iohexol  (OMNIPAQUE ) 350 MG/ML injection 40 mL   Radiation/Fluoro  Fluoro time: 4.8 (min) DAP: 22585 (mGycm2) Cumulative Air Kerma: 276 (mGy) Complications  Complications documented before study signed (12/21/2023 11:22 AM)   No complications were associated with this study.  Documented by Shauna Dallas SQUIBB, RN - 12/21/2023 11:08 AM     Coronary Findings  Diagnostic Dominance: Right Left Anterior Descending  There is mild diffuse disease throughout the vessel.    Left Circumflex  Collaterals  Dist Cx filled by collaterals from 3rd RPL.    Non-stenotic Mid Cx to Dist Cx lesion was previously  treated.  Dist Cx lesion is 100% stenosed. The lesion is chronically occluded.    First Obtuse Marginal Branch  Vessel is large in size. The vessel exhibits minimal luminal irregularities.  1st Mrg lesion is 25% stenosed.    Third Obtuse Marginal Branch  Collaterals  3rd Mrg filled by collaterals from 3rd RPL.      Right Coronary Artery  Prox RCA to Mid RCA lesion is 10% stenosed. The lesion was previously treated .    LIMA Graft To Dist LAD  And is small.    Sequential Graft To 1st Mrg, 3rd Mrg  Origin to Prox Graft lesion before 1st Mrg is 100% stenosed.    Intervention   No interventions have been documented.   Coronary Diagrams  Diagnostic Dominance: Right  Intervention   Implants   No implant documentation for this case.   Syngo Images   Show images for CARDIAC CATHETERIZATION Images on Long Term Storage   Show images for Pease, Rayne Loiseau to Procedure Log  Procedure Log    Hemo Data  Flowsheet Row Most Recent Value  Fick Cardiac Output 5.53 L/min  Fick Cardiac Output Index 2.66 (L/min)/BSA  RA A Wave 4 mmHg  RA V Wave 3 mmHg  RA Mean -1 mmHg  RV Systolic Pressure 27 mmHg  RV Diastolic Pressure 0  mmHg  RV EDP 7 mmHg  PA Systolic Pressure 33 mmHg  PA Diastolic Pressure 18 mmHg  PA Mean 24 mmHg  PW A Wave 11 mmHg  PW V Wave 12 mmHg  PW Mean 13 mmHg  AO Systolic Pressure 105 mmHg  AO Diastolic Pressure 63 mmHg  AO Mean 78 mmHg  QP/QS 1  TPVR Index 9.01 HRUI  TSVR Index 29.29 HRUI  TPVR/TSVR Ratio 0.31   Narrative & Impression  CLINICAL DATA:  89M with severe aortic stenosis being evaluated for a TAVR procedure.   EXAM: Cardiac TAVR CT   TECHNIQUE: A non-contrast, gated CT scan was obtained with axial slices of 2.5 mm through the heart for aortic valve scoring. A 120 kV retrospective, gated, contrast cardiac scan was obtained. Gantry rotation speed was 230 msec and collimation was 0.63 mm. Nitroglycerin  was not given. A delayed scan  was obtained to exclude left atrial appendage thrombus. The 3D dataset was reconstructed in systole with motion correction. The 3D data set was reconstructed in 5% intervals of the 0-95% of the R-R cycle. Systolic and diastolic phases were analyzed on a dedicated workstation using MPR, MIP, and VRT modes. The patient received 100 cc of contrast.   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   Aortic valve calcium  score: 2012   Aortic annulus:   Diameter: 24mm x 19mm   Perimeter: 70mm   Area: 379 mm^2   Calcifications: Mild calcification adjacent to right cusp   Coronary height: Min Left - 10mm; Min Right - 15mm   Sinotubular height: Left cusp - 19mm; Right cusp -21mm; Noncoronary cusp - 22mm   LVOT (as measured 3 mm below the annulus):   Diameter: 26mm x 20mm   Area: 361mm^2   Calcifications: No calcifications   Aortic sinus width: Left cusp - 31mm; Right cusp - 29mm; Noncoronary cusp - 32mm   Sinotubular junction width: 30mm x 27mm   Optimum Fluoroscopic Angle for Delivery: RAO 15 CRA 10   Cardiac:   Right atrium: Mild enlargement   Right ventricle: Normal size   Pulmonary arteries: Normal size   Pulmonary veins: Normal configuration   Left atrium: Mild enlargement   Left ventricle: Normal size   Pericardium: Normal thickness   Coronary arteries: Normal origin. S/p CABG with small LIMA-LAD, appears patent. Occluded SVG-OM   IMPRESSION: 1. 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 Severe aortic valve calcifications (AV calcium  score 2012)   2. Aortic annulus measures 24mm x 19mm in diameter with perimeter 70mm and area 379 mm^2. Mild annular calcifications adjacent to right coronary cusp. Annular measurements are suitable for delivery of 23mm Edwards Sapien 3 valve   3. Low coronary height for left main,  measures 10mm. Sufficient coronary height for RCA, measures 15mm.   4. Optimum Fluoroscopic Angle for Delivery:  RAO 15 CRA 10   5. S/p CABG with small LIMA-LAD, appears patent.  Occluded SVG-OM     Electronically Signed   By: Johnny [name] M.D.   On: 01/15/2024 13:58   Narrative & Impression  CLINICAL DATA:  Aortic valve replacement (TAVR), pre-op eval   EXAM: CT ANGIOGRAPHY CHEST, ABDOMEN AND PELVIS   TECHNIQUE: Non-contrast CT of the chest was initially obtained.   Multidetector CT imaging through the chest, abdomen and pelvis was performed using the standard protocol during bolus administration of intravenous contrast. Multiplanar reconstructed images  and MIPs were obtained and reviewed to evaluate the vascular anatomy.   RADIATION DOSE REDUCTION: This exam was performed according to the departmental dose-optimization program which includes automated exposure control, adjustment of the mA and/or kV according to patient size and/or use of iterative reconstruction technique.   CONTRAST:  OMNIPAQUE  IOHEXOL  350 MG/ML SOLN   COMPARISON:  None Available.   FINDINGS: CTA CHEST FINDINGS   Cardiovascular: Preferential opacification of the thoracic aorta. No evidence of thoracic aortic dissection. Enlarged ascending thoracic aorta with caliber up to 4 cm. Descending thoracic aorta is normal in caliber. Normal heart size. No significant pericardial effusion. Mild atherosclerotic plaque of the thoracic aorta. At least 3 vessel coronary artery calcifications. Aortic valve leaflet calcifications. The main pulmonary artery is normal in caliber. Two lead cardiac pacemaker.   Mediastinum/Nodes: No enlarged mediastinal, hilar, or axillary lymph nodes. Thyroid  gland, trachea, and esophagus demonstrate no significant findings.   Lungs/Pleura: Paraseptal emphysematous changes. No focal consolidation. No pulmonary nodule. No pulmonary mass. No pleural effusion. No  pneumothorax.   Musculoskeletal:   No chest wall abnormality.   No suspicious lytic or blastic osseous lesions. Old healed left rib fractures. No acute displaced fracture. Multilevel degenerative changes of the spine. Severe degenerative changes of the right shoulder. Partially visualized left shoulder arthroplasty.   Review of the MIP images confirms the above findings.   CTA ABDOMEN AND PELVIS FINDINGS   VASCULAR   Aorta: Normal caliber aorta without aneurysm, dissection, vasculitis or significant stenosis.   Celiac: Patent without evidence of aneurysm, dissection, vasculitis or significant stenosis.   SMA: Patent without evidence of aneurysm, dissection, vasculitis or significant stenosis.   Renals: Both renal arteries are patent without evidence of aneurysm, dissection, vasculitis, fibromuscular dysplasia or significant stenosis.   IMA: Patent without evidence of aneurysm, dissection, vasculitis or significant stenosis.   Inflow: Patent without evidence of aneurysm, dissection, vasculitis or significant stenosis.   Veins: No obvious venous abnormality within the limitations of this arterial phase study.   Review of the MIP images confirms the above findings.   NON-VASCULAR   Hepatobiliary: No focal liver abnormality. Calcified gallstone noted within the gallbladder lumen. No gallbladder wall thickening or pericholecystic fluid. No biliary dilatation.   Pancreas: No focal lesion. Normal pancreatic contour. No surrounding inflammatory changes. No main pancreatic ductal dilatation.   Spleen: Normal in size without focal abnormality.   Adrenals/Urinary Tract:   No adrenal nodule bilaterally.   Bilateral renal cortical scarring. Bilateral kidneys enhance symmetrically.   No hydronephrosis. No hydroureter. Pericentimeter right simple renal cyst. Simple renal cysts, in the absence of clinically indicated signs/symptoms, require no independent follow-up.    The urinary bladder is unremarkable.   Stomach/Bowel: Stomach is within normal limits. No evidence of bowel wall thickening or dilatation. Colonic diverticulosis. The appendix is not definitely identified with no inflammatory changes in the right lower quadrant to suggest acute appendicitis.   Lymphatic: No lymphadenopathy.   Reproductive: Prostate is unremarkable.   Other: No intraperitoneal free fluid. No intraperitoneal free gas. No organized fluid collection.   Musculoskeletal: Small supraumbilical Richter hernia containing the anti mesenteric wall of a short segment of small bowel. Small fat containing umbilical hernia.   No suspicious lytic or blastic osseous lesions. No acute displaced fracture. Multilevel degenerative changes of the spine.   T11-S1 posterolateral and interbody surgical hardware. Multilevel severe degenerative changes of the spine.   Review of the MIP images confirms the above findings.   IMPRESSION: 1. Aneurysmal ascending  thoracic aorta (4 cm). Recommend annual imaging followup by CTA or MRA. This recommendation follows 2010 ACCF/AHA/AATS/ACR/ASA/SCA/SCAI/SIR/STS/SVM Guidelines for the Diagnosis and Management of Patients with Thoracic Aortic Disease. Circulation. 2010; 121: [id number]. Aortic aneurysm NOS (ICD10-I71.9). 2. No dissection of the thoracic or abdominal aorta. 3. No aneurysm of the abdominal aorta. 4. Aortic Atherosclerosis (ICD10-I70.0) - including coronary artery and aortic valve leaflet calcifications-correlate with aortic stenosis. 5. Cholelithiasis with no acute cholecystitis. 6. Colonic diverticulosis with no acute diverticulitis. 7. 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 8.  Emphysema (ICD10-J43.9).     Electronically Signed   By: Morgane  Naveau M.D.   On: 01/16/2024 20:42   Impression:  This 78 year old gentleman has stage D, severe, symptomatic  aortic stenosis with NYHA class IIl symptoms of exertional fatigue and shortness of breath consistent with chronic diastolic congestive heart failure.  He has also been having frequent episodes of dizziness and lightheadedness.  I have personally reviewed his echo, cath, and CTA images.  His echo shows a moderately calcified aortic valve with restricted leaflet mobility.  The mean gradient was measured at 32 mmHg with an aortic valve area of 0.94 cm by VTI.  Left ventricular ejection fraction was 60 to 65% with a stroke-volume index of 31.  Cardiac catheterization showed patent left circumflex and right coronary stents.  The vein graft to the obtuse marginal 1 and 2 was known to be occluded.  The LIMA to the LAD is known to be a atretic and was not imaged.  There was competitive flow seen in the distal LAD.  Gated coronary CTA shows a trileaflet but functionally bicuspid aortic valve with partial fusion of the left and right cusps.  The valve looks severely stenotic with an aortic valve calcium  score of 2012.  The small LIMA to the LAD appears patent.  There was an occluded saphenous vein graft to the obtuse marginal branches  I agree that aortic valve replacement is indicated in this patient for relief of his worsening symptoms and to prevent progressive left ventricular dysfunction.  His gated cardiac CTA shows anatomy suitable for TAVR using a 23 mm Edwards SAPIEN 3 valve or a 26 mm Medtronic valve.  Given his BMI of 37 with a BSA of 2.1 I think a Medtronic valve would be the best option for him.  His abdominal and pelvic CTA shows adequate pelvic vascular anatomy to allow transfemoral insertion on the right side.  The patient was counseled at length regarding treatment alternatives for management of severe symptomatic aortic stenosis. The risks and benefits of surgical intervention has been discussed in detail. Long-term prognosis with medical therapy was discussed. Alternative approaches such as  conventional surgical aortic valve replacement, transcatheter aortic valve replacement, and palliative medical therapy were compared and contrasted at length. This discussion was placed in the context of the patient's own specific clinical presentation and past medical history. All of his questions have been addressed.   Following the decision to proceed with transcatheter aortic valve replacement, a discussion was held regarding what types of management strategies would be attempted intraoperatively in the event of life-threatening complications, including whether or not the patient would be considered a candidate for the use of cardiopulmonary bypass and/or conversion to open sternotomy for attempted surgical intervention.  Given his age, prior coronary bypass surgery, and comorbidities I do not think he is a candidate for emergent sternotomy to manage any intraoperative complications.  The patient has been  advised of a variety of complications that might develop including but not limited to risks of death, stroke, paravalvular leak, aortic dissection or other major vascular complications, aortic annulus rupture, device embolization, cardiac rupture or perforation, mitral regurgitation, acute myocardial infarction, arrhythmia, heart block or bradycardia requiring permanent pacemaker placement, congestive heart failure, respiratory failure, renal failure, pneumonia, infection, other late complications related to structural valve deterioration or migration, or other complications that might ultimately cause a temporary or permanent loss of functional independence or other long term morbidity. The patient provides full informed consent for the procedure as described and all questions were answered.      Plan:  He will be scheduled for transfemoral TAVR using a Medtronic Evolute FX valve on 01/30/2024.  His Eliquis  will need to be stopped at least 2 days ahead of time.  I spent 60 minutes performing this  consultation and > 50% of this time was spent face to face counseling and coordinating the care of this patient's severe symptomatic aortic stenosis.   Dorise LOIS Fellers, MD 01/23/2024

## 2024-01-24 ENCOUNTER — Other Ambulatory Visit: Payer: Self-pay | Admitting: Physician Assistant

## 2024-01-26 ENCOUNTER — Other Ambulatory Visit: Payer: Self-pay

## 2024-01-26 ENCOUNTER — Encounter (HOSPITAL_COMMUNITY)
Admission: RE | Admit: 2024-01-26 | Discharge: 2024-01-26 | Disposition: A | Discharge status: Home / Self Care | Source: Ambulatory Visit | Attending: Cardiovascular Disease

## 2024-01-26 ENCOUNTER — Ambulatory Visit: Payer: Self-pay | Admitting: Physician Assistant

## 2024-01-26 ENCOUNTER — Ambulatory Visit (HOSPITAL_COMMUNITY)
Admission: RE | Admit: 2024-01-26 | Discharge: 2024-01-26 | Disposition: A | Discharge status: Home / Self Care | Source: Ambulatory Visit | Attending: Physician Assistant | Admitting: Physician Assistant

## 2024-01-26 DIAGNOSIS — I517 Cardiomegaly: Secondary | ICD-10-CM | POA: Diagnosis not present

## 2024-01-26 DIAGNOSIS — I350 Nonrheumatic aortic (valve) stenosis: Secondary | ICD-10-CM | POA: Diagnosis not present

## 2024-01-26 DIAGNOSIS — Z01818 Encounter for other preprocedural examination: Secondary | ICD-10-CM | POA: Insufficient documentation

## 2024-01-26 DIAGNOSIS — S2242XA Multiple fractures of ribs, left side, initial encounter for closed fracture: Secondary | ICD-10-CM | POA: Diagnosis not present

## 2024-01-26 LAB — TYPE AND SCREEN
ABO/RH(D): O POS
Antibody Screen: NEGATIVE

## 2024-01-26 LAB — PROTIME-INR
INR: 1.3 — ABNORMAL HIGH (ref 0.8–1.2)
Prothrombin Time: 17.3 s — ABNORMAL HIGH (ref 11.4–15.2)

## 2024-01-26 LAB — CBC
HCT: 47.3 % (ref 39.0–52.0)
Hemoglobin: 15.1 g/dL (ref 13.0–17.0)
MCH: 29.1 pg (ref 26.0–34.0)
MCHC: 31.9 g/dL (ref 30.0–36.0)
MCV: 91.1 fL (ref 80.0–100.0)
Platelets: 186 10*3/uL (ref 150–400)
RBC: 5.19 MIL/uL (ref 4.22–5.81)
RDW: 18.2 % — ABNORMAL HIGH (ref 11.5–15.5)
WBC: 6.8 10*3/uL (ref 4.0–10.5)
nRBC: 0 % (ref 0.0–0.2)

## 2024-01-26 LAB — COMPREHENSIVE METABOLIC PANEL WITH GFR
ALT: 20 U/L (ref 0–44)
AST: 22 U/L (ref 15–41)
Albumin: 3.9 g/dL (ref 3.5–5.0)
Alkaline Phosphatase: 67 U/L (ref 38–126)
Anion gap: 9 (ref 5–15)
BUN: 20 mg/dL (ref 8–23)
CO2: 27 mmol/L (ref 22–32)
Calcium: 11 mg/dL — ABNORMAL HIGH (ref 8.9–10.3)
Chloride: 103 mmol/L (ref 98–111)
Creatinine, Ser: 1.64 mg/dL — ABNORMAL HIGH (ref 0.61–1.24)
GFR, Estimated: 43 mL/min — ABNORMAL LOW (ref >60)
Glucose, Bld: 100 mg/dL — ABNORMAL HIGH (ref 70–99)
Potassium: 4.9 mmol/L (ref 3.5–5.1)
Sodium: 139 mmol/L (ref 135–145)
Total Bilirubin: 0.6 mg/dL (ref 0.0–1.2)
Total Protein: 7 g/dL (ref 6.5–8.1)

## 2024-01-26 LAB — URINALYSIS, ROUTINE W REFLEX MICROSCOPIC
Bilirubin Urine: NEGATIVE
Glucose, UA: NEGATIVE mg/dL
Hgb urine dipstick: NEGATIVE
Ketones, ur: NEGATIVE mg/dL
Leukocytes,Ua: NEGATIVE
Nitrite: NEGATIVE
Protein, ur: NEGATIVE mg/dL
Specific Gravity, Urine: 1.011 (ref 1.005–1.030)
pH: 5 (ref 5.0–8.0)

## 2024-01-26 LAB — SURGICAL PCR SCREEN
MRSA, PCR: NEGATIVE
Staphylococcus aureus: NEGATIVE

## 2024-01-26 NOTE — Progress Notes (Signed)
 Patient signed all consents at PAT lab appointment. CHG soap and instructions were given to patient. CHG surgical prep reviewed with patient and all questions answered.  Patients chart send to anesthesia for review. Pt denies any respiratory illness/infection in the last two months.

## 2024-01-29 MED ORDER — NOREPINEPHRINE 4 MG/250ML-% IV SOLN
0.0000 ug/min | INTRAVENOUS | Status: AC
  Administered 2024-01-30: 1 ug/min via INTRAVENOUS
  Filled 2024-01-29: qty 250

## 2024-01-29 MED ORDER — POTASSIUM CHLORIDE 2 MEQ/ML IV SOLN
80.0000 meq | INTRAVENOUS | Status: DC
  Filled 2024-01-29 (×2): qty 40

## 2024-01-29 MED ORDER — HEPARIN 30,000 UNITS/1000 ML (OHS) CELLSAVER SOLUTION
Status: DC
  Filled 2024-01-29 (×2): qty 1000

## 2024-01-29 MED ORDER — DEXMEDETOMIDINE HCL IN NACL 400 MCG/100ML IV SOLN
0.1000 ug/kg/h | INTRAVENOUS | Status: AC
  Administered 2024-01-30: 1 ug/kg/h via INTRAVENOUS
  Administered 2024-01-30: 98.92 ug via INTRAVENOUS
  Filled 2024-01-29: qty 100

## 2024-01-29 MED ORDER — MAGNESIUM SULFATE 50 % IJ SOLN
40.0000 meq | INTRAMUSCULAR | Status: DC
  Filled 2024-01-29 (×2): qty 9.85

## 2024-01-29 MED ORDER — CEFAZOLIN SODIUM-DEXTROSE 2-4 GM/100ML-% IV SOLN
2.0000 g | INTRAVENOUS | Status: AC
  Administered 2024-01-30: 2 g via INTRAVENOUS
  Filled 2024-01-29: qty 100

## 2024-01-29 NOTE — H&P (Signed)
 Patient ID: Johnny [name], male   DOB: 11/01/1945, 78 y.o.   MRN: [id number]  [facility]   MULTIDISCIPLINARY HEART VALVE CLINIC     CARDIOTHORACIC SURGERY ADMISSION HISTORY AND PHYSICAL   PCP is Medina-Vargas, Jereld BROCKS, NP Referring Provider is Lurena Red, MD Primary Cardiologist is Lonni LITTIE Nanas, MD   Reason for admission: Severe aortic stenosis   HPI:   The patient is a 78 year old gentleman with a history of hypertension, hyperlipidemia, morbid obesity, type 2 diabetes, stage IIIb CKD, PAF on Eliquis , sick sinus syndrome status post permanent pacemaker, coronary artery disease status post PCI of the RCA and left circumflex with CABG in 2015, and severe aortic stenosis who was referred for consideration of TAVR.  His most recent echocardiogram on 11/06/2023 showed a trileaflet aortic valve with moderate calcification.  The mean gradient was 32 mmHg with a peak of 59.6 mmHg.  Aortic valve area by VTI was 0.94 cm with a dimensionless index of 0.3.  Left ventricular ejection fraction was 60 to 65% with grade 2 diastolic dysfunction.  Stroke-volume index was low at 31.   He presents with worsening exertional shortness of breath and fatigue and has also been having frequent episodes of lightheadedness and dizziness with position changes.  He is now getting short of breath with walking in his house and had to take several breaks walking into my office today.  He loves to sing but has not had the wind to be able to sing over the past several months.  He denies peripheral edema.  He has had no orthopnea or PND.  Denies any chest pain.       Past Medical History:  Diagnosis Date   Atrial fibrillation (HCC)     Deafness in left ear     Hypertension     Symptomatic bradycardia 04/2021    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[place], [place]               Past Surgical History:  Procedure Laterality Date   BACK SURGERY   1975    Lower Back Surgery, Dr.Seltzer   BACK SURGERY   1985     Dr.Seltzer   LEFT HEART CATH AND CORS/GRAFTS ANGIOGRAPHY N/A 11/17/2022    Procedure: LEFT HEART CATH AND CORS/GRAFTS ANGIOGRAPHY;  Surgeon: Dann Candyce RAMAN, MD;  Location: [facility];  Service: Cardiovascular;  Laterality: N/A;   RIGHT/LEFT HEART CATH AND CORONARY/GRAFT ANGIOGRAPHY N/A 12/21/2023    Procedure: RIGHT/LEFT HEART CATH AND CORONARY/GRAFT ANGIOGRAPHY;  Surgeon: Red Lurena POUR, MD;  Location: [facility];  Service: Cardiovascular;  Laterality: N/A;   SHOULDER SURGERY Left 1965    Dr.Seltzer   TONSILLECTOMY   1950    Dr.Perellie               Family History  Problem Relation Age of Onset   Cancer Father     Stroke Sister     Stroke Brother          x 2          Social History         Socioeconomic History   Marital status: Widowed      Spouse name: Not on file   Number of children: Not on file   Years of education: Not on file   Highest education level: 12th grade  Occupational History   Not on file  Tobacco Use   Smoking status: Former      Current packs/day:  1.00      Average packs/day: 1 pack/day for 10.0 years (10.0 ttl pk-yrs)      Types: Cigarettes   Smokeless tobacco: Never   Tobacco comments:      Quit at age 78  Vaping Use   Vaping status: Never Used  Substance and Sexual Activity   Alcohol use: Yes      Alcohol/week: 1.0 standard drink of alcohol      Types: 1 Glasses of wine per week      Comment: occasion   Drug use: Never   Sexual activity: Not on file  Other Topics Concern   Not on file  Social History Narrative    Tobacco use, amount per day now: None    Past tobacco use, amount per day: 1 pack    How many years did you use tobacco: 10 years.    Alcohol use (drinks per week): None    Diet:    Do you drink/eat things with caffeine: Yes    Marital status:  Widow                                What year were you married? 1972    Do you live in a house, apartment, assisted living, condo, trailer, etc.? House     Is it one or more stories? One    How many persons live in your home? One    Do you have pets in your home?( please list) No    Highest Level of education completed? 12th grade.    Current or past profession: Psychologist, occupational, Investment banker, operational, Clinical biochemist.     Do you exercise?   No                               Type and how often?    Do you have a living will? Yes    Do you have a DNR form?     No                              If not, do you want to discuss one?    Do you have signed POA/HPOA forms?  Yes                      If so, please bring to you appointment         Do you have any difficulty bathing or dressing yourself? No    Do you have any difficulty preparing food or eating? No    Do you have any difficulty managing your medications? No    Do you have any difficulty managing your finances? Yes    Do you have any difficulty affording your medications? No    Social Drivers of Acupuncturist Strain: High Risk (07/02/2023)    Overall Financial Resource Strain (CARDIA)     Difficulty of Paying Living Expenses: Hard  Food Insecurity: Food Insecurity Present (07/02/2023)    Hunger Vital Sign     Worried About Running Out of Food in the Last Year: Sometimes true     Ran Out of Food in the Last Year: Sometimes true  Transportation Needs: Unmet Transportation Needs (07/02/2023)    PRAPARE - Therapist, art (Medical):  Yes     Lack of Transportation (Non-Medical): No  Physical Activity: Unknown (07/02/2023)    Exercise Vital Sign     Days of Exercise per Week: 0 days     Minutes of Exercise per Session: Not on file  Stress: Stress Concern Present (07/02/2023)    [employer] of Occupational Health - Occupational Stress Questionnaire     Feeling of Stress : To some extent  Social Connections: Moderately Isolated (07/02/2023)    Social Connection and Isolation Panel     Frequency of Communication with Friends and Family: More than three times a week      Frequency of Social Gatherings with Friends and Family: Never     Attends Religious Services: More than 4 times per year     Active Member of [employer] or Organizations: No     Attends Banker Meetings: Not on file     Marital Status: Widowed  Intimate Partner Violence: Not At Risk (11/17/2022)    Humiliation, Afraid, Rape, and Kick questionnaire     Fear of Current or Ex-Partner: No     Emotionally Abused: No     Physically Abused: No     Sexually Abused: No             Prior to Admission medications   Medication Sig Start Date End Date Taking? Authorizing Provider  albuterol  (VENTOLIN  HFA) 108 (90 Base) MCG/ACT inhaler Inhale 1 puff into the lungs every 6 (six) hours as needed for wheezing or shortness of breath.       [provider]  amiodarone  (PACERONE ) 200 MG tablet TAKE 1 TABLET BY MOUTH DAILY 08/09/23     Medina-Vargas, Monina C, NP  apixaban  (ELIQUIS ) 5 MG TABS tablet TAKE 1 TABLET BY MOUTH TWICE  DAILY 12/18/23     Medina-Vargas, Monina C, NP  carvedilol  (COREG ) 6.25 MG tablet TAKE 1 TABLET BY MOUTH TWICE  DAILY 12/18/23     Medina-Vargas, Monina C, NP  cyanocobalamin  (VITAMIN B12) 1000 MCG tablet Take 1 tablet (1,000 mcg total) by mouth daily. 11/05/22     Camara, Amadou, MD  famotidine  (PEPCID ) 40 MG tablet Take 40 mg by mouth daily.       [provider]  fluticasone  (FLONASE ) 50 MCG/ACT nasal spray Place 2 sprays into both nostrils 3 (three) times daily as needed for allergies or rhinitis.       [provider]  furosemide  (LASIX ) 40 MG tablet TAKE 1 TABLET BY MOUTH EVERY  OTHER DAY 07/10/23     Medina-Vargas, Monina C, NP  glucose blood test strip 1 each by Other route daily. [facility] Glucometer 02/14/23     Ngetich, Dinah C, NP  isosorbide  mononitrate (IMDUR ) 30 MG 24 hr tablet TAKE 1 TABLET BY MOUTH DAILY 08/09/23     Medina-Vargas, Monina C, NP  loratadine  (CLARITIN ) 10 MG tablet Take 1 tablet (10 mg total) by mouth daily. Patient taking  differently: Take 10 mg by mouth daily as needed for allergies. 09/02/22     Ngetich, Dinah C, NP  mirtazapine  (REMERON  SOL-TAB) 30 MG disintegrating tablet Take 1 tablet (30 mg total) by mouth at bedtime. 04/19/23     Sherlynn Madden, MD  montelukast  (SINGULAIR ) 10 MG tablet Take 10 mg by mouth daily as needed (for allergies).       [provider]  nitroGLYCERIN  (NITROSTAT ) 0.4 MG SL tablet DISSOLVE 1 TABLET UNDER THE  TONGUE EVERY 5 MINUTES AS NEEDED FOR CHEST PAIN. MAX  OF 3 TABLETS IN 15 MINUTES. CALL 911 IF PAIN  PERSISTS. 08/09/23     Medina-Vargas, Monina C, NP  pantoprazole  (PROTONIX ) 40 MG tablet TAKE 1 TABLET BY MOUTH DAILY  BEFORE BREAKFAST 04/18/23     Ngetich, Dinah C, NP  ranolazine  (RANEXA ) 500 MG 12 hr tablet Take 1 tablet (500 mg total) by mouth 2 (two) times daily. 01/03/23     Parthenia Olivia HERO, PA-C  rosuvastatin  (CRESTOR ) 40 MG tablet TAKE 1 TABLET BY MOUTH DAILY 08/09/23     Medina-Vargas, Monina C, NP  sodium chloride  (OCEAN) 0.65 % SOLN nasal spray Place 1 spray into both nostrils as needed for congestion. 09/02/22     Ngetich, Dinah C, NP  TYLENOL  8 HOUR ARTHRITIS PAIN 650 MG CR tablet Take 650-1,300 mg by mouth every 8 (eight) hours as needed for pain.       [provider]  venlafaxine  XR (EFFEXOR -XR) 150 MG 24 hr capsule TAKE 1 CAPSULE BY MOUTH DAILY  WITH BREAKFAST 06/20/23     Ngetich, Dinah C, NP            Current Outpatient Medications  Medication Sig Dispense Refill   albuterol  (VENTOLIN  HFA) 108 (90 Base) MCG/ACT inhaler Inhale 1 puff into the lungs every 6 (six) hours as needed for wheezing or shortness of breath.       amiodarone  (PACERONE ) 200 MG tablet TAKE 1 TABLET BY MOUTH DAILY 100 tablet 2   apixaban  (ELIQUIS ) 5 MG TABS tablet TAKE 1 TABLET BY MOUTH TWICE  DAILY 180 tablet 3   carvedilol  (COREG ) 6.25 MG tablet TAKE 1 TABLET BY MOUTH TWICE  DAILY 180 tablet 3   cyanocobalamin  (VITAMIN B12) 1000 MCG tablet Take 1 tablet (1,000 mcg total) by mouth  daily. 90 tablet 1   famotidine  (PEPCID ) 40 MG tablet Take 40 mg by mouth daily.       fluticasone  (FLONASE ) 50 MCG/ACT nasal spray Place 2 sprays into both nostrils 3 (three) times daily as needed for allergies or rhinitis.       furosemide  (LASIX ) 40 MG tablet TAKE 1 TABLET BY MOUTH EVERY  OTHER DAY 50 tablet 2   glucose blood test strip 1 each by Other route daily. [facility] Glucometer 100 each 12   isosorbide  mononitrate (IMDUR ) 30 MG 24 hr tablet TAKE 1 TABLET BY MOUTH DAILY 100 tablet 2   loratadine  (CLARITIN ) 10 MG tablet Take 1 tablet (10 mg total) by mouth daily. (Patient taking differently: Take 10 mg by mouth daily as needed for allergies.) 30 tablet 11   mirtazapine  (REMERON  SOL-TAB) 30 MG disintegrating tablet Take 1 tablet (30 mg total) by mouth at bedtime. 90 tablet 3   montelukast  (SINGULAIR ) 10 MG tablet Take 10 mg by mouth daily as needed (for allergies).       nitroGLYCERIN  (NITROSTAT ) 0.4 MG SL tablet DISSOLVE 1 TABLET UNDER THE  TONGUE EVERY 5 MINUTES AS NEEDED FOR CHEST PAIN. MAX OF 3 TABLETS IN 15 MINUTES. CALL 911 IF PAIN  PERSISTS. 75 tablet 4   pantoprazole  (PROTONIX ) 40 MG tablet TAKE 1 TABLET BY MOUTH DAILY  BEFORE BREAKFAST 100 tablet 2   ranolazine  (RANEXA ) 500 MG 12 hr tablet Take 1 tablet (500 mg total) by mouth 2 (two) times daily. 180 tablet 3   rosuvastatin  (CRESTOR ) 40 MG tablet TAKE 1 TABLET BY MOUTH DAILY 100 tablet 2   sodium chloride  (OCEAN) 0.65 % SOLN nasal spray Place 1 spray into both nostrils as needed for  congestion. 88 mL 3   TYLENOL  8 HOUR ARTHRITIS PAIN 650 MG CR tablet Take 650-1,300 mg by mouth every 8 (eight) hours as needed for pain.       venlafaxine  XR (EFFEXOR -XR) 150 MG 24 hr capsule TAKE 1 CAPSULE BY MOUTH DAILY  WITH BREAKFAST 90 capsule 3      No current facility-administered medications for this visit.        Allergies       Allergies  Allergen Reactions   Metformin And Related Other (See Comments)      Metformin caused the  patient's legs to become weak            Review of Systems:               General:                      normal appetite, + decreased energy, no weight gain, + weight loss, no fever             Cardiac:                       no chest pain with exertion, no chest pain at rest, +SOB with mild exertion, + resting SOB, no PND, no orthopnea, + palpitations, + arrhythmia, + atrial fibrillation, no LE edema, + dizzy spells, no syncope             Respiratory:                 + shortness of breath, + home oxygen , no productive cough, no dry cough, no bronchitis, no wheezing, no hemoptysis, no asthma, no pain with inspiration or cough, + sleep apnea, + CPAP at night             GI:                               no difficulty swallowing, no reflux, no frequent heartburn, no hiatal hernia, no abdominal pain, no constipation, no diarrhea, no hematochezia, no hematemesis, no melena             GU:                              no dysuria,  no frequency, no urinary tract infection, no hematuria, no enlarged prostate, no kidney stones, + chronic kidney disease             Vascular:                     no pain suggestive of claudication, no pain in feet, no leg cramps, no varicose veins, no DVT, no non-healing foot ulcer             Neuro:                         no stroke, no TIA's, no seizures, no headaches, no temporary blindness one eye,  no slurred speech, no peripheral neuropathy, no chronic pain, + instability of gait, no memory/cognitive dysfunction             Musculoskeletal:         + arthritis - primarily involving the spine, no joint swelling, no myalgias, + difficulty walking, + reduced mobility  Skin:                            + rash, + itching, no skin infections, no pressure sores or ulcerations             Psych:                         no anxiety, + depression, no nervousness, no unusual recent stress             Eyes:                           no blurry vision, no floaters, no recent  vision changes, + wears glasses              ENT:                            No hearing loss, + loose or painful teeth, no dentures, last saw dentist 01/07/2024             Hematologic:               no easy bruising, no abnormal bleeding, no clotting disorder, no frequent epistaxis             Endocrine:                   + diabetes, does check CBG's at home                            Physical Exam:               BP 104/71 (BP Location: Left Arm)   Pulse 60   Resp 18   Ht 5' 6 (1.676 m)   Wt 218 lb (98.9 kg)   SpO2 93%   BMI 35.19 kg/m              General:                      Obese gentleman,  well-appearing             HEENT:                       Unremarkable, NCAT, PERLA, EOMI             Neck:                           no JVD, no bruits, no adenopathy              Chest:                          clear to auscultation, symmetrical breath sounds, no wheezes, no rhonchi              CV:                              RRR, 3/6 systolic murmur LLSB, no diastolic murmur             Abdomen:  soft, non-tender, no masses              Extremities:                 warm, well-perfused, pedalpulses palpable, no lower extremity edema             Rectal/GU                   Deferred             Neuro:                         Grossly non-focal and symmetrical throughout             Skin:                            Clean and dry, no rashes, no breakdown   Diagnostic Tests:       ECHOCARDIOGRAM REPORT       Patient Name:   SEWARD CORAN Date of Exam: 11/06/2023  Medical Rec #:  [id number]     Height:       66.0 in  Accession #:    [id number]    Weight:       226.0 lb  Date of Birth:  Jan 17, 1946    BSA:          2.106 m  Patient Age:    77 years      BP:           128/74 mmHg  Patient Gender: M             HR:           62 bpm.  Exam Location:  [address]   Procedure: 2D Echo, Color Doppler and Cardiac Doppler (Both Spectral and  Color            Flow Doppler were  utilized during procedure).   Indications:    Aortic Stenosis I35.0    History:        Patient has prior history of Echocardiogram examinations,  most                 recent 11/16/2022. Arrythmias:Atrial Fibrillation; Risk                  Factors:Hypertension.    Sonographer:    Augustin Seals RDCS  Referring Phys: [id number] CHRISTOPHER L SCHUMANN   IMPRESSIONS     1. Left ventricular ejection fraction, by estimation, is 60 to 65%. The  left ventricle has normal function. The left ventricle has no regional  wall motion abnormalities. There is mild concentric left ventricular  hypertrophy. Left ventricular diastolic  parameters are consistent with Grade II diastolic dysfunction  (pseudonormalization).   2. Right ventricular systolic function is normal. The right ventricular  size is normal. There is normal pulmonary artery systolic pressure.   3. Left atrial size was mildly dilated.   4. The mitral valve is normal in structure. Trivial mitral valve  regurgitation. No evidence of mitral stenosis.   5. The aortic valve is tricuspid. There is moderate calcification of the  aortic valve. Aortic valve regurgitation is mild. Moderate to severe  aortic valve stenosis. Aortic valve area, by VTI measures 0.94 cm. Aortic  valve mean gradient measures 32.0  mmHg. Aortic valve Vmax measures 3.86 m/s.   6. Aortic dilatation noted. There is mild dilatation  of the ascending  aorta, measuring 41 mm.   7. The inferior vena cava is normal in size with greater than 50%  respiratory variability, suggesting right atrial pressure of 3 mmHg.   FINDINGS   Left Ventricle: Left ventricular ejection fraction, by estimation, is 60  to 65%. The left ventricle has normal function. The left ventricle has no  regional wall motion abnormalities. The left ventricular internal cavity  size was normal in size. There is   mild concentric left ventricular hypertrophy. Left ventricular diastolic  parameters are  consistent with Grade II diastolic dysfunction  (pseudonormalization).   Right Ventricle: The right ventricular size is normal. No increase in  right ventricular wall thickness. Right ventricular systolic function is  normal. There is normal pulmonary artery systolic pressure. The tricuspid  regurgitant velocity is 2.37 m/s, and   with an assumed right atrial pressure of 3 mmHg, the estimated right  ventricular systolic pressure is 25.5 mmHg.   Left Atrium: Left atrial size was mildly dilated.   Right Atrium: Right atrial size was normal in size.   Pericardium: There is no evidence of pericardial effusion.   Mitral Valve: The mitral valve is normal in structure. Mild mitral annular  calcification. Trivial mitral valve regurgitation. No evidence of mitral  valve stenosis.   Tricuspid Valve: The tricuspid valve is normal in structure. Tricuspid  valve regurgitation is mild . No evidence of tricuspid stenosis.   Aortic Valve: The aortic valve is tricuspid. There is moderate  calcification of the aortic valve. Aortic valve regurgitation is mild.  Moderate to severe aortic stenosis is present. Aortic valve mean gradient  measures 32.0 mmHg. Aortic valve peak gradient   measures 59.6 mmHg. Aortic valve area, by VTI measures 0.94 cm.   Pulmonic Valve: The pulmonic valve was normal in structure. Pulmonic valve  regurgitation is trivial. No evidence of pulmonic stenosis.   Aorta: Aortic dilatation noted. There is mild dilatation of the ascending  aorta, measuring 41 mm.   Venous: The inferior vena cava is normal in size with greater than 50%  respiratory variability, suggesting right atrial pressure of 3 mmHg.   IAS/Shunts: No atrial level shunt detected by color flow Doppler.     LEFT VENTRICLE  PLAX 2D  LVIDd:         5.00 cm     Diastology  LVIDs:         3.20 cm     LV e' medial:    4.73 cm/s  LV PW:         1.20 cm     LV E/e' medial:  15.6  LV IVS:        1.30 cm     LV e'  lateral:   6.66 cm/s  LVOT diam:     2.00 cm     LV E/e' lateral: 11.1  LV SV:         66  LV SV Index:   31  LVOT Area:     3.14 cm    LV Volumes (MOD)  LV vol d, MOD A2C: 49.8 ml  LV vol d, MOD A4C: 53.3 ml  LV vol s, MOD A2C: 22.4 ml  LV vol s, MOD A4C: 24.3 ml  LV SV MOD A2C:     27.4 ml  LV SV MOD A4C:     53.3 ml  LV SV MOD BP:      31.7 ml   RIGHT VENTRICLE  IVC  RV Basal diam:  3.70 cm    IVC diam: 1.70 cm  RV Mid diam:    3.10 cm  RV S prime:     8.15 cm/s  TAPSE (M-mode): 1.8 cm   LEFT ATRIUM             Index        RIGHT ATRIUM           Index  LA diam:        4.40 cm 2.09 cm/m   RA Area:     13.70 cm  LA Vol (A2C):   77.9 ml 36.98 ml/m  RA Volume:   28.60 ml  13.58 ml/m  LA Vol (A4C):   47.1 ml 22.36 ml/m  LA Biplane Vol: 63.0 ml 29.91 ml/m   AORTIC VALVE  AV Area (Vmax):    0.85 cm  AV Area (Vmean):   0.81 cm  AV Area (VTI):     0.94 cm  AV Vmax:           386.00 cm/s  AV Vmean:          254.000 cm/s  AV VTI:            0.705 m  AV Peak Grad:      59.6 mmHg  AV Mean Grad:      32.0 mmHg  LVOT Vmax:         105.00 cm/s  LVOT Vmean:        65.700 cm/s  LVOT VTI:          0.211 m  LVOT/AV VTI ratio: 0.30    AORTA  Ao Root diam: 3.30 cm  Ao Asc diam:  4.10 cm   MITRAL VALVE               TRICUSPID VALVE  MV Area (PHT): 3.53 cm    TR Peak grad:   22.5 mmHg  MV Decel Time: 215 msec    TR Vmax:        237.00 cm/s  MV E velocity: 73.90 cm/s  MV A velocity: 53.00 cm/s  SHUNTS  MV E/A ratio:  1.39        Systemic VTI:  0.21 m                             Systemic Diam: 2.00 cm   Toribio Fuel MD  Electronically signed by Toribio Fuel MD  Signature Date/Time: 11/06/2023/11:40:56 AM      Physicians   Panel Physicians Referring Physician Case Authorizing Physician  Wendel Lurena POUR, MD (Primary)        Procedures   RIGHT/LEFT HEART CATH AND CORONARY/GRAFT ANGIOGRAPHY    Conclusion       Dist Cx lesion is 100% stenosed.    Prox RCA to Mid RCA lesion is 10% stenosed.   Origin to Prox Graft lesion before 1st Mrg  is 100% stenosed.   1st Mrg lesion is 25% stenosed.   Non-stenotic Mid Cx to Dist Cx lesion was previously treated.   and is small.   1.  Patent left circumflex and right coronary artery stents with mild to moderate diffuse disease elsewhere. 2.  Vein graft to obtuse marginal 1 and 2 is known occluded and was not imaged. 3.  LIMA to LAD is known atretic and was not imaged.  Of note there is competitive flow seen in the distal LAD. 4.  Fick cardiac output of 5.5 L/min and Fick cardiac index of 2.7 L/min/m with the following hemodynamics:            Right atrial pressure mean of 1 to 2 mmHg            Right ventricular pressure 27/0 with an end-diastolic pressure of 7 mmHg            Wedge pressure mean of 13 mmHg with V waves to 12 mmHg            PA pressure of 33/18 with mean of 24 mmHg            PVR of 2 Woods units            PA pulsatility index of greater than 4      Recommendation: Continue evaluation for aortic valve intervention.     Indications   Nonrheumatic aortic valve stenosis [I35.0 (ICD-10-CM)]    Procedural Details   Technical Details The patient is a 78 year old male with a history of severe symptomatic aortic stenosis, coronary artery disease status post PCI of the right coronary artery and left circumflex followed by CABG consisting of a LIMA to LAD and vein graft to obtuse marginal.  Of note the LIMA to LAD is known atretic and the vein graft to obtuse marginal is known occluded.  He also has a history of paroxysmal atrial fibrillation on Eliquis , sick sinus syndrome status post permanent pacemaker, diet-controlled type 2 diabetes, hyperlipidemia, and CKD stage IIIb.  He is referred for preprocedural assessment prior to aortic valve intervention.  The patient was brought to the cardiac catheterization laboratory and prepped draped sterile fashion.  Xylocaine  was used to  anesthetize the right wrist and a 6 French Terumo glide sheath was placed.  5000 units heparin  and 5 mg verapamil  administered through the sheath.  Ultrasound was used to gain access to the right internal jugular vein.  A 7 French sheath was placed.  Right heart catheterization was performed with a 7 [place] balloontipped catheter.  Angiography of the left coronary artery was performed with a 6 [place] TIG catheter and angiography of the right coronary artery was performed with the 5 [place] JR4 catheter.  After review of the angiographic images and hemodynamic data, no further interventions were pursued.  A TR band was placed and manual pressure applied to the IJ site.  There were no acute complications. Estimated blood loss <50 mL.   During this procedure medications were administered to achieve and maintain moderate conscious sedation while the patient's heart rate, blood pressure, and oxygen  saturation were continuously monitored and I was present face-to-face 100% of this time. Logan Boothe Rad Tech and [place] RN are independent, trained observers who assisted in the monitoring of the patient's level of consciousness.    Medications (Filter: Administrations occurring from 1011 to 1121 on 12/21/23) midazolam  (VERSED ) injection (mg)  Total dose: 1 mg Date/Time Rate/Dose/Volume Action    12/21/23 1026 1 mg Given    fentaNYL  (SUBLIMAZE ) injection (mcg)  Total dose: 25 mcg Date/Time Rate/Dose/Volume Action    12/21/23 1027 25 mcg Given    Heparin  (Porcine) in NaCl 1000-0.9 UT/500ML-% SOLN (mL)  Total volume: 1,000 mL Date/Time Rate/Dose/Volume Action    12/21/23 1029 500 mL Given    1029 500 mL Given    lidocaine  (PF) (XYLOCAINE ) 1 % injection (mL)  Total volume: 4 mL Date/Time Rate/Dose/Volume Action    12/21/23 1033 2 mL Given  1038 2 mL Given    Radial Cocktail/Verapamil  only (mL)  Total volume: 10 mL Date/Time Rate/Dose/Volume Action    12/21/23 1039 10 mL Given    heparin   sodium (porcine) injection (Units)  Total dose: 5,000 Units Date/Time Rate/Dose/Volume Action    12/21/23 1040 5,000 Units Given    iohexol  (OMNIPAQUE ) 350 MG/ML injection (mL)  Total volume: 40 mL Date/Time Rate/Dose/Volume Action    12/21/23 1102 40 mL Given      Sedation Time   Sedation Time Physician-1: 33 minutes 44 seconds Contrast        Administrations occurring from 1011 to 1121 on 12/21/23:  Medication Name Total Dose  iohexol  (OMNIPAQUE ) 350 MG/ML injection 40 mL    Radiation/Fluoro   Fluoro time: 4.8 (min) DAP: 22585 (mGycm2) Cumulative Air Kerma: 276 (mGy) Complications   Complications documented before study signed (12/21/2023 11:22 AM)    No complications were associated with this study.  Documented by Shauna Dallas SQUIBB, RN - 12/21/2023 11:08 AM      Coronary Findings   Diagnostic Dominance: Right Left Anterior Descending  There is mild diffuse disease throughout the vessel.    Left Circumflex  Collaterals  Dist Cx filled by collaterals from 3rd RPL.     Non-stenotic Mid Cx to Dist Cx lesion was previously treated.  Dist Cx lesion is 100% stenosed. The lesion is chronically occluded.    First Obtuse Marginal Branch  Vessel is large in size. The vessel exhibits minimal luminal irregularities.  1st Mrg lesion is 25% stenosed.    Third Obtuse Marginal Branch  Collaterals  3rd Mrg filled by collaterals from 3rd RPL.       Right Coronary Artery  Prox RCA to Mid RCA lesion is 10% stenosed. The lesion was previously treated .    LIMA Graft To Dist LAD  And is small.    Sequential Graft To 1st Mrg, 3rd Mrg  Origin to Prox Graft lesion before 1st Mrg is 100% stenosed.    Intervention    No interventions have been documented.    Coronary Diagrams   Diagnostic Dominance: Right  Intervention    Implants    No implant documentation for this case.    Syngo Images    Show images for CARDIAC CATHETERIZATION Images on Long Term Storage     Show images for [place], [place] to Procedure Log   Procedure Log    Hemo Data   Flowsheet Row Most Recent Value  Fick Cardiac Output 5.53 L/min  Fick Cardiac Output Index 2.66 (L/min)/BSA  RA A Wave 4 mmHg  RA V Wave 3 mmHg  RA Mean -1 mmHg  RV Systolic Pressure 27 mmHg  RV Diastolic Pressure 0 mmHg  RV EDP 7 mmHg  PA Systolic Pressure 33 mmHg  PA Diastolic Pressure 18 mmHg  PA Mean 24 mmHg  PW A Wave 11 mmHg  PW V Wave 12 mmHg  PW Mean 13 mmHg  AO Systolic Pressure 105 mmHg  AO Diastolic Pressure 63 mmHg  AO Mean 78 mmHg  QP/QS 1  TPVR Index 9.01 HRUI  TSVR Index 29.29 HRUI  TPVR/TSVR Ratio 0.31    Narrative & Impression  CLINICAL DATA:  50M with severe aortic stenosis being evaluated for a TAVR procedure.   EXAM: Cardiac TAVR CT   TECHNIQUE: A non-contrast, gated CT scan was obtained with axial slices of 2.5 mm through the heart for aortic valve scoring. A 120 kV retrospective, gated, contrast cardiac scan was obtained.  Gantry rotation speed was 230 msec and collimation was 0.63 mm. Nitroglycerin  was not given. A delayed scan was obtained to exclude left atrial appendage thrombus. The 3D dataset was reconstructed in systole with motion correction. The 3D data set was reconstructed in 5% intervals of the 0-95% of the R-R cycle. Systolic and diastolic phases were analyzed on a dedicated workstation using MPR, MIP, and VRT modes. The patient received 100 cc of contrast.   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   Aortic valve calcium  score: 2012   Aortic annulus:   Diameter: 24mm x 19mm   Perimeter: 70mm   Area: 379 mm^2   Calcifications: Mild calcification adjacent to right cusp   Coronary height: Min Left - 10mm; Min Right - 15mm   Sinotubular height: Left cusp - 19mm; Right cusp -21mm; Noncoronary cusp - 22mm   LVOT (as measured 3 mm below the annulus):    Diameter: 26mm x 20mm   Area: 315mm^2   Calcifications: No calcifications   Aortic sinus width: Left cusp - 31mm; Right cusp - 29mm; Noncoronary cusp - 32mm   Sinotubular junction width: 30mm x 27mm   Optimum Fluoroscopic Angle for Delivery: RAO 15 CRA 10   Cardiac:   Right atrium: Mild enlargement   Right ventricle: Normal size   Pulmonary arteries: Normal size   Pulmonary veins: Normal configuration   Left atrium: Mild enlargement   Left ventricle: Normal size   Pericardium: Normal thickness   Coronary arteries: Normal origin. S/p CABG with small LIMA-LAD, appears patent. Occluded SVG-OM   IMPRESSION: 1. 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 Severe aortic valve calcifications (AV calcium  score 2012)   2. Aortic annulus measures 24mm x 19mm in diameter with perimeter 70mm and area 379 mm^2. Mild annular calcifications adjacent to right coronary cusp. Annular measurements are suitable for delivery of 23mm Edwards Sapien 3 valve   3. Low coronary height for left main, measures 10mm. Sufficient coronary height for RCA, measures 15mm.   4. Optimum Fluoroscopic Angle for Delivery:  RAO 15 CRA 10   5. S/p CABG with small LIMA-LAD, appears patent.  Occluded SVG-OM     Electronically Signed   By: Lonni Nanas M.D.   On: 01/15/2024 13:58    Narrative & Impression  CLINICAL DATA:  Aortic valve replacement (TAVR), pre-op eval   EXAM: CT ANGIOGRAPHY CHEST, ABDOMEN AND PELVIS   TECHNIQUE: Non-contrast CT of the chest was initially obtained.   Multidetector CT imaging through the chest, abdomen and pelvis was performed using the standard protocol during bolus administration of intravenous contrast. Multiplanar reconstructed images and MIPs were obtained and reviewed to evaluate the vascular anatomy.   RADIATION DOSE REDUCTION: This exam was performed according to the departmental  dose-optimization program which includes automated exposure control, adjustment of the mA and/or kV according to patient size and/or use of iterative reconstruction technique.   CONTRAST:  OMNIPAQUE  IOHEXOL  350 MG/ML SOLN   COMPARISON:  None Available.   FINDINGS: CTA CHEST FINDINGS   Cardiovascular: Preferential opacification of the thoracic aorta. No evidence of thoracic aortic dissection. Enlarged ascending thoracic aorta with caliber up to 4 cm. Descending thoracic aorta is normal in caliber. Normal heart size. No significant pericardial effusion. Mild atherosclerotic plaque of the thoracic aorta. At least 3 vessel coronary artery calcifications. Aortic valve leaflet calcifications. The main pulmonary artery is normal  in caliber. Two lead cardiac pacemaker.   Mediastinum/Nodes: No enlarged mediastinal, hilar, or axillary lymph nodes. Thyroid  gland, trachea, and esophagus demonstrate no significant findings.   Lungs/Pleura: Paraseptal emphysematous changes. No focal consolidation. No pulmonary nodule. No pulmonary mass. No pleural effusion. No pneumothorax.   Musculoskeletal:   No chest wall abnormality.   No suspicious lytic or blastic osseous lesions. Old healed left rib fractures. No acute displaced fracture. Multilevel degenerative changes of the spine. Severe degenerative changes of the right shoulder. Partially visualized left shoulder arthroplasty.   Review of the MIP images confirms the above findings.   CTA ABDOMEN AND PELVIS FINDINGS   VASCULAR   Aorta: Normal caliber aorta without aneurysm, dissection, vasculitis or significant stenosis.   Celiac: Patent without evidence of aneurysm, dissection, vasculitis or significant stenosis.   SMA: Patent without evidence of aneurysm, dissection, vasculitis or significant stenosis.   Renals: Both renal arteries are patent without evidence of aneurysm, dissection, vasculitis, fibromuscular dysplasia or  significant stenosis.   IMA: Patent without evidence of aneurysm, dissection, vasculitis or significant stenosis.   Inflow: Patent without evidence of aneurysm, dissection, vasculitis or significant stenosis.   Veins: No obvious venous abnormality within the limitations of this arterial phase study.   Review of the MIP images confirms the above findings.   NON-VASCULAR   Hepatobiliary: No focal liver abnormality. Calcified gallstone noted within the gallbladder lumen. No gallbladder wall thickening or pericholecystic fluid. No biliary dilatation.   Pancreas: No focal lesion. Normal pancreatic contour. No surrounding inflammatory changes. No main pancreatic ductal dilatation.   Spleen: Normal in size without focal abnormality.   Adrenals/Urinary Tract:   No adrenal nodule bilaterally.   Bilateral renal cortical scarring. Bilateral kidneys enhance symmetrically.   No hydronephrosis. No hydroureter. Pericentimeter right simple renal cyst. Simple renal cysts, in the absence of clinically indicated signs/symptoms, require no independent follow-up.   The urinary bladder is unremarkable.   Stomach/Bowel: Stomach is within normal limits. No evidence of bowel wall thickening or dilatation. Colonic diverticulosis. The appendix is not definitely identified with no inflammatory changes in the right lower quadrant to suggest acute appendicitis.   Lymphatic: No lymphadenopathy.   Reproductive: Prostate is unremarkable.   Other: No intraperitoneal free fluid. No intraperitoneal free gas. No organized fluid collection.   Musculoskeletal: Small supraumbilical Richter hernia containing the anti mesenteric wall of a short segment of small bowel. Small fat containing umbilical hernia.   No suspicious lytic or blastic osseous lesions. No acute displaced fracture. Multilevel degenerative changes of the spine.   T11-S1 posterolateral and interbody surgical hardware.  Multilevel severe degenerative changes of the spine.   Review of the MIP images confirms the above findings.   IMPRESSION: 1. Aneurysmal ascending thoracic aorta (4 cm). Recommend annual imaging followup by CTA or MRA. This recommendation follows 2010 ACCF/AHA/AATS/ACR/ASA/SCA/SCAI/SIR/STS/SVM Guidelines for the Diagnosis and Management of Patients with Thoracic Aortic Disease. Circulation. 2010; 121: [id number]. Aortic aneurysm NOS (ICD10-I71.9). 2. No dissection of the thoracic or abdominal aorta. 3. No aneurysm of the abdominal aorta. 4. Aortic Atherosclerosis (ICD10-I70.0) - including coronary artery and aortic valve leaflet calcifications-correlate with aortic stenosis. 5. Cholelithiasis with no acute cholecystitis. 6. Colonic diverticulosis with no acute diverticulitis. 7. 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 8.  Emphysema (ICD10-J43.9).     Electronically Signed   By: Morgane  Naveau M.D.   On: 01/16/2024 20:42    Impression:   This 78 year old gentleman has stage  D, severe, symptomatic aortic stenosis with NYHA class IIl symptoms of exertional fatigue and shortness of breath consistent with chronic diastolic congestive heart failure.  He has also been having frequent episodes of dizziness and lightheadedness.  I have personally reviewed his echo, cath, and CTA images.  His echo shows a moderately calcified aortic valve with restricted leaflet mobility.  The mean gradient was measured at 32 mmHg with an aortic valve area of 0.94 cm by VTI.  Left ventricular ejection fraction was 60 to 65% with a stroke-volume index of 31.  Cardiac catheterization showed patent left circumflex and right coronary stents.  The vein graft to the obtuse marginal 1 and 2 was known to be occluded.  The LIMA to the LAD is known to be a atretic and was not imaged.  There was competitive flow seen in the distal LAD.  Gated coronary CTA  shows a trileaflet but functionally bicuspid aortic valve with partial fusion of the left and right cusps.  The valve looks severely stenotic with an aortic valve calcium  score of 2012.  The small LIMA to the LAD appears patent.  There was an occluded saphenous vein graft to the obtuse marginal branches   I agree that aortic valve replacement is indicated in this patient for relief of his worsening symptoms and to prevent progressive left ventricular dysfunction.  His gated cardiac CTA shows anatomy suitable for TAVR using a 23 mm Edwards SAPIEN 3 valve or a 26 mm Medtronic valve.  Given his BMI of 37 with a BSA of 2.1 I think a Medtronic valve would be the best option for him.  His abdominal and pelvic CTA shows adequate pelvic vascular anatomy to allow transfemoral insertion on the right side.   The patient was counseled at length regarding treatment alternatives for management of severe symptomatic aortic stenosis. The risks and benefits of surgical intervention has been discussed in detail. Long-term prognosis with medical therapy was discussed. Alternative approaches such as conventional surgical aortic valve replacement, transcatheter aortic valve replacement, and palliative medical therapy were compared and contrasted at length. This discussion was placed in the context of the patient's own specific clinical presentation and past medical history. All of his questions have been addressed.    Following the decision to proceed with transcatheter aortic valve replacement, a discussion was held regarding what types of management strategies would be attempted intraoperatively in the event of life-threatening complications, including whether or not the patient would be considered a candidate for the use of cardiopulmonary bypass and/or conversion to open sternotomy for attempted surgical intervention.  Given his age, prior coronary bypass surgery, and comorbidities I do not think he is a candidate for emergent  sternotomy to manage any intraoperative complications.  The patient has been advised of a variety of complications that might develop including but not limited to risks of death, stroke, paravalvular leak, aortic dissection or other major vascular complications, aortic annulus rupture, device embolization, cardiac rupture or perforation, mitral regurgitation, acute myocardial infarction, arrhythmia, heart block or bradycardia requiring permanent pacemaker placement, congestive heart failure, respiratory failure, renal failure, pneumonia, infection, other late complications related to structural valve deterioration or migration, or other complications that might ultimately cause a temporary or permanent loss of functional independence or other long term morbidity. The patient provides full informed consent for the procedure as described and all questions were answered.       Plan:   Transfemoral TAVR using a Medtronic Evolute FX valve. His Eliquis  will need to  be stopped at least 2 days ahead of time.      Dorise LOIS Fellers, MD

## 2024-01-30 ENCOUNTER — Inpatient Hospital Stay (HOSPITAL_COMMUNITY)
Admission: RE | Admit: 2024-01-30 | Discharge: 2024-02-07 | DRG: 266 | Disposition: A | Discharge status: Skilled Nursing Facility | Attending: Surgery | Admitting: Surgery

## 2024-01-30 ENCOUNTER — Inpatient Hospital Stay (HOSPITAL_COMMUNITY): Admitting: Physician Assistant

## 2024-01-30 ENCOUNTER — Ambulatory Visit (HOSPITAL_COMMUNITY)

## 2024-01-30 ENCOUNTER — Encounter (HOSPITAL_COMMUNITY)
Admission: RE | Disposition: A | Discharge status: Skilled Nursing Facility | Source: Home / Self Care | Attending: Surgery

## 2024-01-30 ENCOUNTER — Encounter (HOSPITAL_COMMUNITY): Payer: Self-pay | Admitting: Cardiovascular Disease

## 2024-01-30 ENCOUNTER — Other Ambulatory Visit: Payer: Self-pay

## 2024-01-30 ENCOUNTER — Inpatient Hospital Stay (HOSPITAL_COMMUNITY): Admitting: Certified Registered Nurse Anesthetist

## 2024-01-30 DIAGNOSIS — M6281 Muscle weakness (generalized): Secondary | ICD-10-CM | POA: Diagnosis not present

## 2024-01-30 DIAGNOSIS — I2510 Atherosclerotic heart disease of native coronary artery without angina pectoris: Secondary | ICD-10-CM | POA: Diagnosis present

## 2024-01-30 DIAGNOSIS — Z87891 Personal history of nicotine dependence: Secondary | ICD-10-CM

## 2024-01-30 DIAGNOSIS — I10 Essential (primary) hypertension: Secondary | ICD-10-CM | POA: Diagnosis present

## 2024-01-30 DIAGNOSIS — G4733 Obstructive sleep apnea (adult) (pediatric): Secondary | ICD-10-CM | POA: Diagnosis not present

## 2024-01-30 DIAGNOSIS — K5900 Constipation, unspecified: Secondary | ICD-10-CM | POA: Diagnosis not present

## 2024-01-30 DIAGNOSIS — Z79899 Other long term (current) drug therapy: Secondary | ICD-10-CM | POA: Diagnosis not present

## 2024-01-30 DIAGNOSIS — Z9889 Other specified postprocedural states: Secondary | ICD-10-CM | POA: Diagnosis not present

## 2024-01-30 DIAGNOSIS — Z602 Problems related to living alone: Secondary | ICD-10-CM | POA: Diagnosis present

## 2024-01-30 DIAGNOSIS — I350 Nonrheumatic aortic (valve) stenosis: Secondary | ICD-10-CM

## 2024-01-30 DIAGNOSIS — I5032 Chronic diastolic (congestive) heart failure: Secondary | ICD-10-CM | POA: Diagnosis present

## 2024-01-30 DIAGNOSIS — I25810 Atherosclerosis of coronary artery bypass graft(s) without angina pectoris: Secondary | ICD-10-CM | POA: Diagnosis present

## 2024-01-30 DIAGNOSIS — R2681 Unsteadiness on feet: Secondary | ICD-10-CM | POA: Diagnosis not present

## 2024-01-30 DIAGNOSIS — I7121 Aneurysm of the ascending aorta, without rupture: Secondary | ICD-10-CM | POA: Diagnosis not present

## 2024-01-30 DIAGNOSIS — I495 Sick sinus syndrome: Secondary | ICD-10-CM | POA: Diagnosis not present

## 2024-01-30 DIAGNOSIS — I739 Peripheral vascular disease, unspecified: Secondary | ICD-10-CM | POA: Diagnosis not present

## 2024-01-30 DIAGNOSIS — Z952 Presence of prosthetic heart valve: Secondary | ICD-10-CM | POA: Diagnosis not present

## 2024-01-30 DIAGNOSIS — E1122 Type 2 diabetes mellitus with diabetic chronic kidney disease: Secondary | ICD-10-CM | POA: Diagnosis present

## 2024-01-30 DIAGNOSIS — Z6837 Body mass index (BMI) 37.0-37.9, adult: Secondary | ICD-10-CM | POA: Diagnosis not present

## 2024-01-30 DIAGNOSIS — Z7401 Bed confinement status: Secondary | ICD-10-CM | POA: Diagnosis not present

## 2024-01-30 DIAGNOSIS — I358 Other nonrheumatic aortic valve disorders: Secondary | ICD-10-CM | POA: Diagnosis not present

## 2024-01-30 DIAGNOSIS — Z006 Encounter for examination for normal comparison and control in clinical research program: Secondary | ICD-10-CM

## 2024-01-30 DIAGNOSIS — I480 Paroxysmal atrial fibrillation: Secondary | ICD-10-CM | POA: Diagnosis not present

## 2024-01-30 DIAGNOSIS — E782 Mixed hyperlipidemia: Secondary | ICD-10-CM | POA: Diagnosis present

## 2024-01-30 DIAGNOSIS — I5033 Acute on chronic diastolic (congestive) heart failure: Secondary | ICD-10-CM | POA: Diagnosis present

## 2024-01-30 DIAGNOSIS — R066 Hiccough: Secondary | ICD-10-CM | POA: Diagnosis not present

## 2024-01-30 DIAGNOSIS — J449 Chronic obstructive pulmonary disease, unspecified: Secondary | ICD-10-CM | POA: Diagnosis not present

## 2024-01-30 DIAGNOSIS — Z823 Family history of stroke: Secondary | ICD-10-CM

## 2024-01-30 DIAGNOSIS — E1151 Type 2 diabetes mellitus with diabetic peripheral angiopathy without gangrene: Secondary | ICD-10-CM | POA: Diagnosis present

## 2024-01-30 DIAGNOSIS — N1832 Chronic kidney disease, stage 3b: Secondary | ICD-10-CM | POA: Diagnosis not present

## 2024-01-30 DIAGNOSIS — Z7901 Long term (current) use of anticoagulants: Secondary | ICD-10-CM

## 2024-01-30 DIAGNOSIS — Z950 Presence of cardiac pacemaker: Secondary | ICD-10-CM

## 2024-01-30 DIAGNOSIS — Z954 Presence of other heart-valve replacement: Secondary | ICD-10-CM | POA: Diagnosis not present

## 2024-01-30 DIAGNOSIS — Z955 Presence of coronary angioplasty implant and graft: Secondary | ICD-10-CM

## 2024-01-30 DIAGNOSIS — I951 Orthostatic hypotension: Secondary | ICD-10-CM | POA: Diagnosis not present

## 2024-01-30 DIAGNOSIS — I130 Hypertensive heart and chronic kidney disease with heart failure and stage 1 through stage 4 chronic kidney disease, or unspecified chronic kidney disease: Secondary | ICD-10-CM | POA: Diagnosis present

## 2024-01-30 DIAGNOSIS — N179 Acute kidney failure, unspecified: Secondary | ICD-10-CM | POA: Diagnosis not present

## 2024-01-30 DIAGNOSIS — R531 Weakness: Secondary | ICD-10-CM | POA: Diagnosis not present

## 2024-01-30 DIAGNOSIS — Z743 Need for continuous supervision: Secondary | ICD-10-CM | POA: Diagnosis not present

## 2024-01-30 DIAGNOSIS — J9611 Chronic respiratory failure with hypoxia: Secondary | ICD-10-CM | POA: Diagnosis not present

## 2024-01-30 HISTORY — PX: INTRAOPERATIVE TRANSTHORACIC ECHOCARDIOGRAM: SHX6523

## 2024-01-30 LAB — POCT I-STAT, CHEM 8
BUN: 23 mg/dL (ref 8–23)
Calcium, Ion: 1.37 mmol/L (ref 1.15–1.40)
Chloride: 104 mmol/L (ref 98–111)
Creatinine, Ser: 1.4 mg/dL — ABNORMAL HIGH (ref 0.61–1.24)
Glucose, Bld: 132 mg/dL — ABNORMAL HIGH (ref 70–99)
HCT: 40 % (ref 39.0–52.0)
Hemoglobin: 13.6 g/dL (ref 13.0–17.0)
Potassium: 4.1 mmol/L (ref 3.5–5.1)
Sodium: 141 mmol/L (ref 135–145)
TCO2: 25 mmol/L (ref 22–32)

## 2024-01-30 LAB — POCT ACTIVATED CLOTTING TIME: Activated Clotting Time: 412 s

## 2024-01-30 LAB — GLUCOSE, CAPILLARY: Glucose-Capillary: 141 mg/dL — ABNORMAL HIGH (ref 70–99)

## 2024-01-30 LAB — ECHOCARDIOGRAM LIMITED
AR max vel: 1.78 cm2
AV Area VTI: 2.35 cm2
AV Area mean vel: 1.8 cm2
AV Mean grad: 4.2 mmHg
AV Peak grad: 8.6 mmHg
Ao pk vel: 1.46 m/s

## 2024-01-30 LAB — ABO/RH: ABO/RH(D): O POS

## 2024-01-30 MED ORDER — FENTANYL CITRATE (PF) 100 MCG/2ML IJ SOLN
INTRAMUSCULAR | Status: AC
  Filled 2024-01-30: qty 2

## 2024-01-30 MED ORDER — MORPHINE SULFATE (PF) 2 MG/ML IV SOLN
INTRAVENOUS | Status: AC
  Filled 2024-01-30: qty 1

## 2024-01-30 MED ORDER — LORATADINE 10 MG PO TABS
10.0000 mg | ORAL_TABLET | Freq: Every day | ORAL | Status: DC
  Administered 2024-01-30 – 2024-02-07 (×9): 10 mg via ORAL
  Filled 2024-01-30 (×9): qty 1

## 2024-01-30 MED ORDER — LABETALOL HCL 5 MG/ML IV SOLN
10.0000 mg | INTRAVENOUS | Status: DC | PRN
  Administered 2024-01-30: 10 mg via INTRAVENOUS

## 2024-01-30 MED ORDER — ISOSORBIDE MONONITRATE ER 30 MG PO TB24
30.0000 mg | ORAL_TABLET | Freq: Every day | ORAL | Status: DC
  Administered 2024-01-30 – 2024-02-02 (×4): 30 mg via ORAL
  Filled 2024-01-30 (×5): qty 1

## 2024-01-30 MED ORDER — HEPARIN (PORCINE) IN NACL 1000-0.9 UT/500ML-% IV SOLN
INTRAVENOUS | Status: DC | PRN
  Administered 2024-01-30 (×2): 500 mL

## 2024-01-30 MED ORDER — NITROGLYCERIN IN D5W 200-5 MCG/ML-% IV SOLN: 0.0000 ug/min | INTRAVENOUS | Status: DC

## 2024-01-30 MED ORDER — FAMOTIDINE 20 MG PO TABS
40.0000 mg | ORAL_TABLET | Freq: Every day | ORAL | Status: DC
  Administered 2024-01-30 – 2024-02-07 (×9): 40 mg via ORAL
  Filled 2024-01-30 (×9): qty 2

## 2024-01-30 MED ORDER — SODIUM CHLORIDE 0.9 % IV SOLN
INTRAVENOUS | Status: DC | PRN
  Administered 2024-01-30: 1000 mL via INTRAVENOUS

## 2024-01-30 MED ORDER — MIRTAZAPINE 30 MG PO TBDP
30.0000 mg | ORAL_TABLET | Freq: Every day | ORAL | Status: DC
  Administered 2024-01-30 – 2024-02-06 (×8): 30 mg via ORAL
  Filled 2024-01-30 (×9): qty 1

## 2024-01-30 MED ORDER — CEFAZOLIN SODIUM-DEXTROSE 2-4 GM/100ML-% IV SOLN
2.0000 g | Freq: Three times a day (TID) | INTRAVENOUS | Status: AC
  Administered 2024-01-30 – 2024-01-31 (×2): 2 g via INTRAVENOUS
  Filled 2024-01-30 (×2): qty 100

## 2024-01-30 MED ORDER — SODIUM CHLORIDE 0.9 % IV SOLN: INTRAVENOUS | Status: AC

## 2024-01-30 MED ORDER — OXYCODONE HCL 5 MG/5ML PO SOLN
5.0000 mg | Freq: Once | ORAL | Status: DC | PRN
  Filled 2024-01-30: qty 5

## 2024-01-30 MED ORDER — MORPHINE SULFATE (PF) 2 MG/ML IV SOLN
1.0000 mg | INTRAVENOUS | Status: DC | PRN
  Administered 2024-01-30 (×2): 2 mg via INTRAVENOUS
  Filled 2024-01-30: qty 1

## 2024-01-30 MED ORDER — SODIUM CHLORIDE 0.9 % IV SOLN: INTRAVENOUS | Status: DC

## 2024-01-30 MED ORDER — SODIUM CHLORIDE 0.9 % IV SOLN: 250.0000 mL | INTRAVENOUS | Status: DC | PRN

## 2024-01-30 MED ORDER — LIDOCAINE HCL (PF) 1 % IJ SOLN
INTRAMUSCULAR | Status: DC | PRN
Start: 2024-01-30 — End: 2024-01-30
  Administered 2024-01-30: 10 mL

## 2024-01-30 MED ORDER — PROTAMINE SULFATE 10 MG/ML IV SOLN
INTRAVENOUS | Status: DC | PRN
  Administered 2024-01-30: 150 mg via INTRAVENOUS
  Administered 2024-01-30: 10 mg via INTRAVENOUS

## 2024-01-30 MED ORDER — IOHEXOL 350 MG/ML SOLN
INTRAVENOUS | Status: DC | PRN
  Administered 2024-01-30: 35 mL

## 2024-01-30 MED ORDER — FENTANYL CITRATE (PF) 100 MCG/2ML IJ SOLN
INTRAMUSCULAR | Status: DC | PRN
  Administered 2024-01-30 (×4): 25 ug via INTRAVENOUS

## 2024-01-30 MED ORDER — INSULIN ASPART 100 UNIT/ML IJ SOLN
0.0000 [IU] | Freq: Three times a day (TID) | INTRAMUSCULAR | Status: DC
  Administered 2024-01-30 – 2024-02-04 (×7): 2 [IU] via SUBCUTANEOUS
  Administered 2024-02-05: 4 [IU] via SUBCUTANEOUS
  Administered 2024-02-05 – 2024-02-06 (×3): 2 [IU] via SUBCUTANEOUS

## 2024-01-30 MED ORDER — ROSUVASTATIN CALCIUM 20 MG PO TABS
40.0000 mg | ORAL_TABLET | Freq: Every day | ORAL | Status: DC
Start: 2024-01-30 — End: 2024-02-07
  Administered 2024-01-30 – 2024-02-07 (×9): 40 mg via ORAL
  Filled 2024-01-30 (×9): qty 2

## 2024-01-30 MED ORDER — TRAMADOL HCL 50 MG PO TABS
50.0000 mg | ORAL_TABLET | ORAL | Status: DC | PRN
  Administered 2024-01-31: 100 mg via ORAL
  Administered 2024-01-31 (×2): 50 mg via ORAL
  Administered 2024-02-03: 100 mg via ORAL
  Filled 2024-01-30: qty 2
  Filled 2024-01-30: qty 1
  Filled 2024-01-30 (×2): qty 2
  Filled 2024-01-30: qty 1

## 2024-01-30 MED ORDER — VENLAFAXINE HCL ER 75 MG PO CP24
150.0000 mg | ORAL_CAPSULE | Freq: Every day | ORAL | Status: DC
  Administered 2024-01-31 – 2024-02-07 (×8): 150 mg via ORAL
  Filled 2024-01-30 (×8): qty 2

## 2024-01-30 MED ORDER — AMIODARONE HCL 200 MG PO TABS
200.0000 mg | ORAL_TABLET | Freq: Every day | ORAL | Status: DC
  Administered 2024-01-30 – 2024-02-07 (×9): 200 mg via ORAL
  Filled 2024-01-30 (×9): qty 1

## 2024-01-30 MED ORDER — PROPOFOL 10 MG/ML IV BOLUS
INTRAVENOUS | Status: DC | PRN
  Administered 2024-01-30: 25 ug/kg/min via INTRAVENOUS
  Administered 2024-01-30 (×2): 30 mg via INTRAVENOUS
  Administered 2024-01-30 (×3): 20 mg via INTRAVENOUS

## 2024-01-30 MED ORDER — DROPERIDOL 2.5 MG/ML IJ SOLN: 0.6250 mg | Freq: Once | INTRAMUSCULAR | Status: DC | PRN

## 2024-01-30 MED ORDER — FENTANYL CITRATE (PF) 100 MCG/2ML IJ SOLN: 25.0000 ug | INTRAMUSCULAR | Status: DC | PRN

## 2024-01-30 MED ORDER — PANTOPRAZOLE SODIUM 40 MG PO TBEC
40.0000 mg | DELAYED_RELEASE_TABLET | Freq: Every day | ORAL | Status: DC
  Administered 2024-01-31 – 2024-02-07 (×8): 40 mg via ORAL
  Filled 2024-01-30 (×8): qty 1

## 2024-01-30 MED ORDER — CHLORHEXIDINE GLUCONATE 0.12 % MT SOLN
15.0000 mL | Freq: Once | OROMUCOSAL | Status: AC
  Administered 2024-01-30: 15 mL via OROMUCOSAL
  Filled 2024-01-30: qty 15

## 2024-01-30 MED ORDER — OXYCODONE HCL 5 MG PO TABS
5.0000 mg | ORAL_TABLET | ORAL | Status: DC | PRN
  Administered 2024-02-05: 5 mg via ORAL
  Filled 2024-01-30: qty 1

## 2024-01-30 MED ORDER — ACETAMINOPHEN 10 MG/ML IV SOLN: 1000.0000 mg | Freq: Once | INTRAVENOUS | Status: DC | PRN

## 2024-01-30 MED ORDER — OXYCODONE HCL 5 MG PO TABS: 5.0000 mg | ORAL_TABLET | Freq: Once | ORAL | Status: DC | PRN

## 2024-01-30 MED ORDER — ONDANSETRON HCL 4 MG/2ML IJ SOLN
4.0000 mg | Freq: Four times a day (QID) | INTRAMUSCULAR | Status: DC | PRN
  Administered 2024-02-02: 4 mg via INTRAVENOUS
  Filled 2024-01-30: qty 2

## 2024-01-30 MED ORDER — NOREPINEPHRINE 4 MG/250ML-% IV SOLN
0.0000 ug/min | INTRAVENOUS | Status: DC
  Filled 2024-01-30: qty 250

## 2024-01-30 MED ORDER — SODIUM CHLORIDE 0.9% FLUSH: 3.0000 mL | INTRAVENOUS | Status: DC | PRN

## 2024-01-30 MED ORDER — LABETALOL HCL 5 MG/ML IV SOLN
INTRAVENOUS | Status: AC
  Filled 2024-01-30: qty 4

## 2024-01-30 MED ORDER — RANOLAZINE ER 500 MG PO TB12
500.0000 mg | ORAL_TABLET | Freq: Two times a day (BID) | ORAL | Status: DC
  Administered 2024-01-30 – 2024-02-07 (×16): 500 mg via ORAL
  Filled 2024-01-30 (×16): qty 1

## 2024-01-30 MED ORDER — CHLORHEXIDINE GLUCONATE 4 % EX SOLN
60.0000 mL | Freq: Once | CUTANEOUS | Status: DC
  Filled 2024-01-30: qty 60

## 2024-01-30 MED ORDER — HEPARIN SODIUM (PORCINE) 1000 UNIT/ML IJ SOLN
INTRAMUSCULAR | Status: DC | PRN
  Administered 2024-01-30: 15000 [IU] via INTRAVENOUS

## 2024-01-30 MED ORDER — CARVEDILOL 6.25 MG PO TABS
6.2500 mg | ORAL_TABLET | Freq: Two times a day (BID) | ORAL | Status: DC
  Administered 2024-01-30 – 2024-02-02 (×6): 6.25 mg via ORAL
  Filled 2024-01-30 (×7): qty 1

## 2024-01-30 MED ORDER — CHLORHEXIDINE GLUCONATE 4 % EX SOLN: 60.0000 mL | Freq: Once | CUTANEOUS | Status: DC

## 2024-01-30 MED ORDER — ACETAMINOPHEN 325 MG PO TABS: 650.0000 mg | ORAL_TABLET | Freq: Four times a day (QID) | ORAL | Status: DC | PRN

## 2024-01-30 MED ORDER — ACETAMINOPHEN 650 MG RE SUPP: 650.0000 mg | Freq: Four times a day (QID) | RECTAL | Status: DC | PRN

## 2024-01-30 MED ORDER — CHLORHEXIDINE GLUCONATE 4 % EX SOLN
30.0000 mL | CUTANEOUS | Status: DC
  Filled 2024-01-30: qty 30

## 2024-01-30 MED ORDER — SODIUM CHLORIDE 0.9% FLUSH
3.0000 mL | Freq: Two times a day (BID) | INTRAVENOUS | Status: DC
  Administered 2024-01-31 – 2024-02-07 (×15): 3 mL via INTRAVENOUS

## 2024-01-30 NOTE — Transfer of Care (Signed)
 Immediate Anesthesia Transfer of Care Note  Patient: Johnny [name]  Procedure(s) Performed: Transcatheter Aortic Valve Replacement, Transfemoral ECHOCARDIOGRAM, TRANSTHORACIC  Patient Location: Cath Lab  Anesthesia Type:MAC  Level of Consciousness: drowsy, patient cooperative, and responds to stimulation  Airway & Oxygen  Therapy: Patient Spontanous Breathing and Patient connected to face mask oxygen   Post-op Assessment: Report given to RN, Post -op Vital signs reviewed and stable, Patient moving all extremities X 4, and Patient able to stick tongue midline  Post vital signs: Reviewed and stable  Last Vitals:  Vitals Value Taken Time  BP 120/81 01/30/24 16:06  Temp 98.6   Pulse 63 01/30/24 16:08  Resp 14 01/30/24 16:08  SpO2 97 % 01/30/24 16:08  Vitals shown include unfiled device data.  Last Pain:  Vitals:   01/30/24 1325  PainSc: 0-No pain         Complications: There were no known notable events for this encounter.

## 2024-01-30 NOTE — Op Note (Signed)
 [facility]   MULTIDISCIPLINARY HEART VALVE TEAM   TAVR OPERATIVE NOTE   Date of Procedure:  01/30/2024   Preoperative Diagnosis: Severe Aortic Stenosis   Postoperative Diagnosis: Same   Procedure:   Transcatheter Aortic Valve Replacement - Percutaneous  Transfemoral Approach  Medtronic Evolut FX Plus (size 29 mm, serial # [id number])   Co-Surgeons:  Dorise LOIS Fellers, MD and Ozell Fell, MD  Anesthesiologist:  Thom Peoples, MD  Echocardiographer:  Jerel Balding, MD  Pre-operative Echo Findings: Severe aortic stenosis Normal left ventricular systolic function  Post-operative Echo Findings: No paravalvular leak Normal/unchanged left ventricular systolic function  BRIEF CLINICAL NOTE AND INDICATIONS FOR SURGERY 78 year old male with severe, stage D1 aortic stenosis and [place]  Heart Association functional class III symptoms of exertional dyspnea consistent with chronic diastolic heart failure.  He presents today for TAVR via a transfemoral approach.  During the course of the patient's preoperative work up they have been evaluated comprehensively by a multidisciplinary team of specialists coordinated through the Multidisciplinary Heart Valve Clinic in the [facility].  They have been demonstrated to suffer from symptomatic severe aortic stenosis as noted above. The patient has been counseled extensively as to the relative risks and benefits of all options for the treatment of severe aortic stenosis including long term medical therapy, conventional surgery for aortic valve replacement, and transcatheter aortic valve replacement.  The patient has been independently evaluated in formal cardiac surgical consultation by Dr Margret Fellers, who deemed the patient appropriate for TAVR. Based upon review of all of the patient's preoperative diagnostic tests they are felt to be candidate for transcatheter aortic valve replacement using the transfemoral  approach as an alternative to conventional surgery.    Following the decision to proceed with transcatheter aortic valve replacement, a discussion has been held regarding what types of management strategies would be attempted intraoperatively in the event of life-threatening complications, including whether or not the patient would be considered a candidate for the use of cardiopulmonary bypass and/or conversion to open sternotomy for attempted surgical intervention.  The patient has been advised of a variety of complications that might develop peculiar to this approach including but not limited to risks of death, stroke, paravalvular leak, aortic dissection or other major vascular complications, aortic annulus rupture, device embolization, cardiac rupture or perforation, acute myocardial infarction, arrhythmia, heart block or bradycardia requiring permanent pacemaker placement, congestive heart failure, respiratory failure, renal failure, pneumonia, infection, other late complications related to structural valve deterioration or migration, or other complications that might ultimately cause a temporary or permanent loss of functional independence or other long term morbidity.  The patient provides full informed consent for the procedure as described and all questions were answered preoperatively.  DETAILS OF THE OPERATIVE PROCEDURE  PREPARATION:   The patient is brought to the operating room on the above mentioned date. The patient is placed in the supine position on the operating table.  Intravenous antibiotics are administered. The patient is monitored closely throughout the procedure under conscious sedation.  Baseline transthoracic echocardiogram is performed. The patient's chest, abdomen, both groins, and both lower extremities are prepared and draped in a sterile manner. A time out procedure is performed.   PERIPHERAL ACCESS:   Using ultrasound guidance, femoral arterial and venous access is  obtained with placement of 6 Fr sheaths on the left side.  US  images are captured and digitally stored in the patient's record. A pigtail diagnostic catheter was passed through the femoral  arterial sheath under fluoroscopic guidance into the aortic root (non-coronary cusp).  A temporary transvenous pacemaker catheter was passed through the femoral venous sheath under fluoroscopic guidance into the right ventricle.  The pacemaker was tested to ensure stable lead placement and pacemaker capture.   TRANSFEMORAL ACCESS:  A micropuncture technique is used to access the right femoral artery under fluoroscopic and ultrasound guidance.  US  images are captured and digitally stored in the patient's chart. 2 Perclose devices are deployed at 10' and 2' positions to 'PreClose' the femoral artery. An 8 French sheath is placed and then an Amplatz Superstiff wire is advanced through the sheath. This is changed out for an 14 French l sheath after progressively dilating over the Superstiff wire.  An AL-2 catheter was used to direct a straight-tip exchange length wire across the native aortic valve into the left ventricle. This was exchanged out for a pigtail catheter and position was confirmed in the LV apex.  Simultaneous LV and Ao pressures were recorded.  The pigtail catheter was exchanged for a safari wire in the LV apex.    BALLOON AORTIC VALVULOPLASTY:  Performed using rapid ventricular pacing and a 22 mm Bard true balloon.  The patient had good hemodynamic recovery after valvuloplasty.  TRANSCATHETER HEART VALVE DEPLOYMENT:  A Medtronic Evolut FX  transcatheter heart valve (size 29 mm) was prepared and crimped per manufacturer's guidelines, and the proper orientation of the valve is confirmed on the Medtronic delivery system. The valve was advanced through the introducer sheath and across the aortic arch over the Confida wire until it is positioned at the base of the pigtail catheter in the aortic valve annulus. Using  the fine tuning wheel, valve deployment begins until annular contact is made. Controlled ventricular pacing is performed. Once proper position is confirmed via aortic root angiograms in the cusp overlap and LAO projections, the valve is deployed to 80% where it is fully functional. The patient's hemodynamic recovery following valve deployment is good.  Final position is confirmed and the valve is slowly deployed over 30 seconds until both paddles are released. The delivery catheter and Confida wire are removed and the valve is assessed with echocardiography. Echo demostrated acceptable post-procedural gradients, stable mitral valve function, and no aortic insufficiency.    PROCEDURE COMPLETION:  The sheath was removed and femoral artery closure is performed using the 2 previously deployed Perclose devices.  Protamine is administered once femoral arterial repair was complete. The site is clear with no evidence of bleeding or hematoma after the sutures are tightened. The temporary pacemaker and pigtail catheters are removed. Mynx closure is used for contralateral femoral arterial hemostasis for the 6 Fr sheath.  The patient tolerated the procedure well and is transported to the recovery area in stable condition. There were no immediate intraoperative complications. All sponge instrument and needle counts are verified correct at completion of the operation.   The patient received a total of 35 mL of intravenous contrast during the procedure.  LVEDP 24 mmHg  Ozell Fell, MD 01/30/2024 5:43 PM

## 2024-01-30 NOTE — Discharge Summary (Signed)
 [facility]   MULTIDISCIPLINARY HEART VALVE TEAM  Discharge Summary    Patient ID: Johnny [name] MRN: [id number]; DOB: 1945-10-14  Admit date: 01/30/2024 Discharge date: 02/07/2024  PCP:  Phyllis Jereld BROCKS, NP  [facility] Cardiologist:  Lonni LITTIE Nanas, MD  [facility] Structural heart: Ozell Fell, MD [facility] Electrophysiologist:  None   Discharge Diagnoses    Principal Problem:   S/P TAVR (transcatheter aortic valve replacement) Active Problems:   Stage 3b chronic kidney disease (HCC)   Mixed hyperlipidemia   PAF (paroxysmal atrial fibrillation) (HCC)   OSA (obstructive sleep apnea)   Coronary artery disease involving native coronary artery of native heart   Essential hypertension   Severe aortic stenosis   Chronic respiratory failure with hypoxia (HCC)   PAD (peripheral artery disease) (HCC)   Chronic heart failure with preserved ejection fraction (HFpEF) (HCC)   Allergies Allergies  Allergen Reactions   Metformin And Related Other (See Comments)    Metformin caused the patient's legs to become weak    Diagnostic Studies/Procedures    TAVR OPERATIVE NOTE     Date of Procedure:                 01/30/2024   Preoperative Diagnosis:      Severe Aortic Stenosis    Postoperative Diagnosis:    Same    Procedure:        Transcatheter Aortic Valve Replacement - Percutaneous Right Transfemoral Approach             Medtronic Evolut FX + (size 29 mm, serial # [id number])              Co-Surgeons:            Dorise LOIS Fellers, MD and Ozell Fell, MD     Anesthesiologist:                  DOROTHA Peoples, MD   Echocardiographer:              EMERSON Balding, MD   Pre-operative Echo Findings: Severe aortic stenosis  Normal left ventricular systolic function   Post-operative Echo Findings: No paravalvular leak Normal left ventricular systolic function     _____________    Echo 01/31/24: IMPRESSIONS   1. Left ventricular ejection  fraction, by estimation, is 60 to 65%. The  left ventricle has normal function. The left ventricle has no regional  wall motion abnormalities. There is moderate concentric left ventricular  hypertrophy. Left ventricular  diastolic parameters are consistent with Grade II diastolic dysfunction  (pseudonormalization).   2. Right ventricular systolic function is normal. The right ventricular  size is normal. There is normal pulmonary artery systolic pressure. The  estimated right ventricular systolic pressure is 31.7 mmHg.   3. Left atrial size was moderately dilated.   4. The mitral valve is normal in structure. Mild mitral valve  regurgitation. No evidence of mitral stenosis.   5. Bioprosthetic aortic valve s/p TAVR with 29 mm Medtronic Evolut THV.  Mean gradient 10 mmHg, EOA 1.71 cm^2. No perivalvular leakage.   6. The inferior vena cava is normal in size with greater than 50%  respiratory variability, suggesting right atrial pressure of 3 mmHg.    History of Present Illness     Johnny [name] is a 78 y.o. male with a history of HFpEF, HTN, HLD, morbid obesity (BMI 37), DMT2, CKD stage IIIb, PAF on Eliquis , SSS s/p PPM, CAD s/p PCI to RCA and  LCx & CABG (2015), and severe AS who presented to [facility] on 01/30/24 for planned TAVR.   He developed worsening exertional shortness of breath and fatigue as well as frequent episodes of dizziness with position changes. His most recent echocardiogram on 11/06/2023 showed EF 60% and severe AS with mean grad 32 mmHg, AVA 0.81 cm2, SVI 31 and mild AI. L/RHC showed patent left circumflex and right coronary stents. The vein graft to the obtuse marginal 1 and 2 was known to be occluded. The LIMA to the LAD is known to be a atretic and was not imaged. There was competitive flow seen in the distal LAD. The small LIMA to the LAD appears patent. There was an occluded saphenous vein graft to the obtuse marginal branches.  The patient was evaluated by the multidisciplinary  valve team and felt to have severe, symptomatic aortic stenosis and to be a suitable candidate for TAVR, which was set up for 01/30/24.   Hospital Course     Consultants: none   Severe AS:  -- S/p successful TAVR with a 29 mm Medtronic FX + THV via the TF approach on 01/30/24.  -- Post operative echo showed EF 60%, mod concentric LVH, normally functioning TAVR with a mean gradient of 10 mmHg and no PVL as well as mild MR. -- Groin sites are stable.  -- Resumed on home Eliquis  5mg  BID.  -- Hospital course prolonged by poor baseline functional status with poor mobility, hiccups, orthostatic hypotension and constipation. -- Plan DC to SNF today.   Acute HFpEF: -- LVEDP ~24 mm hg at the time of TAVR. -- He complained of worsening SOB. -- He was diuresed with IV Lasix  40mg  x2. Lasix  stopped with bump in renal function.  -- Will resume home Lasix  40mg  daily given elevated BPs. -- BMET at follow up.    AKI in the setting of CKD stage IIIb: -- Creat baseline 1.39-1.64, but now settling around 1.8 after contrast with TAVR.    HTN:  -- BP well controlled.  -- Continue Coreg  6.25mg  BID, Imdur  30mg  daily and Lasix  40 mg daily.   CAD: -- L/RHC showed patent left circumflex and right coronary stents. The vein graft to the obtuse marginal 1 and 2 was known to be occluded. The LIMA to the LAD is known to be a atretic and was not imaged. There was competitive flow seen in the distal LAD. The small LIMA to the LAD appears patent. There was an occluded saphenous vein graft to the obtuse marginal branches. -- Continue medical therapy.    PAF: -- Resumed on home Eliquis  5mg  BID.  -- Continue Coreg  6.25mg  BID.  -- Continue amiodarone  200 mg daily.    Hiccups: -- Trialed Reglan  with no help. -- Given thorazine  with no help and extreme sedation. This has been stopped and should not be restarted.    Constipation: -- Lactulose  PRN _____________  Discharge Vitals Blood pressure 115/85, pulse 60,  temperature 97.6 F (36.4 C), temperature source Axillary, resp. rate 13, height 5' 6 (1.676 m), weight 97.1 kg, SpO2 99%.  Filed Weights   02/04/24 0508 02/05/24 0604 02/07/24 0553  Weight: 98.8 kg 98.1 kg 97.1 kg     Disposition   Pt is being discharged to a SNF today in good condition.  Follow-up Plans & Appointments     Follow-up Information     Sebastian Lamarr SAUNDERS, PA-C. Go on 02/12/2024.   Specialties: Cardiology, Radiology Why: @ 1:45pm, please arrive at least 15 minutes  early. Contact information: [address] [place] [address] [phone]                Discharge Instructions     Amb Referral to Cardiac Rehabilitation   Complete by: As directed    Diagnosis: Valve Replacement   Valve: Aortic Comment - tavr   After initial evaluation and assessments completed: Virtual Based Care may be provided alone or in conjunction with Phase 2 Cardiac Rehab based on patient barriers.: Yes   Intensive Cardiac Rehabilitation (ICR) [facility] location only OR Traditional Cardiac Rehabilitation (TCR) *If criteria for ICR are not met will enroll in TCR [facility] only): Yes       Discharge Medications   Allergies as of 02/07/2024       Reactions   Metformin And Related Other (See Comments)   Metformin caused the patient's legs to become weak        Medication List     STOP taking these medications    Tylenol  8 Hour Arthritis Pain 650 MG CR tablet Generic drug: acetaminophen        TAKE these medications    albuterol  108 (90 Base) MCG/ACT inhaler Commonly known as: VENTOLIN  HFA Inhale 2 puffs into the lungs every 6 (six) hours as needed for wheezing or shortness of breath.   amiodarone  200 MG tablet Commonly known as: PACERONE  TAKE 1 TABLET BY MOUTH DAILY   carvedilol  6.25 MG tablet Commonly known as: COREG  TAKE 1 TABLET BY MOUTH TWICE  DAILY What changed: when to take this   cyanocobalamin  1000 MCG tablet Commonly known as: VITAMIN B12 Take 1  tablet (1,000 mcg total) by mouth daily.   Eliquis  5 MG Tabs tablet Generic drug: apixaban  TAKE 1 TABLET BY MOUTH TWICE  DAILY   famotidine  40 MG tablet Commonly known as: PEPCID  Take 40 mg by mouth daily.   fluticasone  50 MCG/ACT nasal spray Commonly known as: FLONASE  Place 2 sprays into both nostrils daily.   furosemide  40 MG tablet Commonly known as: LASIX  Take 1 tablet (40 mg total) by mouth daily. What changed: when to take this   glucose blood test strip 1 each by Other route daily. [facility] Glucometer   isosorbide  mononitrate 30 MG 24 hr tablet Commonly known as: IMDUR  TAKE 1 TABLET BY MOUTH DAILY   loratadine  10 MG tablet Commonly known as: CLARITIN  Take 1 tablet (10 mg total) by mouth daily. What changed: when to take this   mirtazapine  30 MG disintegrating tablet Commonly known as: REMERON  SOL-TAB Take 1 tablet (30 mg total) by mouth at bedtime. What changed: when to take this   montelukast  10 MG tablet Commonly known as: SINGULAIR  Take 10 mg by mouth daily.   nitroGLYCERIN  0.4 MG SL tablet Commonly known as: NITROSTAT  DISSOLVE 1 TABLET UNDER THE  TONGUE EVERY 5 MINUTES AS NEEDED FOR CHEST PAIN. MAX OF 3 TABLETS IN 15 MINUTES. CALL 911 IF PAIN  PERSISTS.   pantoprazole  40 MG tablet Commonly known as: PROTONIX  TAKE 1 TABLET BY MOUTH DAILY  BEFORE BREAKFAST   ranolazine  500 MG 12 hr tablet Commonly known as: Ranexa  Take 1 tablet (500 mg total) by mouth 2 (two) times daily.   rosuvastatin  40 MG tablet Commonly known as: CRESTOR  TAKE 1 TABLET BY MOUTH DAILY   venlafaxine  XR 150 MG 24 hr capsule Commonly known as: EFFEXOR -XR TAKE 1 CAPSULE BY MOUTH DAILY  WITH BREAKFAST          Outstanding Labs/Studies  BMET  ______________________  Duration of Discharge Encounter: APP Time: 25 minutes. MD Time: 26 minutes.     SignedLamarr Hummer, PA-C 02/07/2024, 2:59 PM [phone]

## 2024-01-30 NOTE — Anesthesia Postprocedure Evaluation (Signed)
 Anesthesia Post Note  Patient: Johnny [name]  Procedure(s) Performed: Transcatheter Aortic Valve Replacement, Transfemoral ECHOCARDIOGRAM, TRANSTHORACIC     Patient location during evaluation: PACU Anesthesia Type: MAC Level of consciousness: awake and alert Pain management: pain level controlled Vital Signs Assessment: post-procedure vital signs reviewed and stable Respiratory status: spontaneous breathing, nonlabored ventilation, respiratory function stable and patient connected to nasal cannula oxygen  Cardiovascular status: stable and blood pressure returned to baseline Postop Assessment: no apparent nausea or vomiting Anesthetic complications: no   There were no known notable events for this encounter.  Last Vitals:  Vitals:   01/30/24 1725 01/30/24 1730  BP: 137/86 (!) 143/97  Pulse: 60 61  Resp: 10 10  Temp:    SpO2: 96% 97%    Last Pain:  Vitals:   01/30/24 1325  PainSc: 0-No pain                 Thom JONELLE Peoples

## 2024-01-30 NOTE — Interval H&P Note (Signed)
 History and Physical Interval Note:  01/30/2024 2:31 PM  Johnny [name]  has presented today for surgery, with the diagnosis of severe aortic stenosis.  The various methods of treatment have been discussed with the patient and family. After consideration of risks, benefits and other options for treatment, the patient has consented to  Procedure(s): Transcatheter Aortic Valve Replacement, Transfemoral (N/A) ECHOCARDIOGRAM, TRANSTHORACIC (N/A) as a surgical intervention.  The patient's history has been reviewed, patient examined, no change in status, stable for surgery.  I have reviewed the patient's chart and labs.  Questions were answered to the patient's satisfaction.     Celso Granja K Datha Kissinger

## 2024-01-30 NOTE — Interval H&P Note (Signed)
 History and Physical Interval Note:  01/30/2024 2:26 PM  Johnny [name]  has presented today for surgery, with the diagnosis of severe aortic stenosis.  The various methods of treatment have been discussed with the patient and family. After consideration of risks, benefits and other options for treatment, the patient has consented to  Procedure(s): Transcatheter Aortic Valve Replacement, Transfemoral (N/A) ECHOCARDIOGRAM, TRANSTHORACIC (N/A) as a surgical intervention.  The patient's history has been reviewed, patient examined, no change in status, stable for surgery.  I have reviewed the patient's chart and labs.  Questions were answered to the patient's satisfaction.     Ozell Fell

## 2024-01-30 NOTE — Anesthesia Preprocedure Evaluation (Addendum)
 Anesthesia Evaluation  Patient identified by MRN, date of birth, ID band Patient awake    Reviewed: Allergy & Precautions, H&P , NPO status , Patient's Chart, lab work & pertinent test results  History of Anesthesia Complications Negative for: history of anesthetic complications  Airway Mallampati: II  TM Distance: >3 FB Neck ROM: Full    Dental no notable dental hx.    Pulmonary sleep apnea , former smoker   Pulmonary exam normal breath sounds clear to auscultation       Cardiovascular hypertension, + CAD, + Past MI and + Peripheral Vascular Disease  Normal cardiovascular exam+ pacemaker  Rhythm:Regular Rate:Normal  IMPRESSIONS     1. Left ventricular ejection fraction, by estimation, is 60 to 65%. The  left ventricle has normal function. The left ventricle has no regional  wall motion abnormalities. There is mild concentric left ventricular  hypertrophy. Left ventricular diastolic  parameters are consistent with Grade II diastolic dysfunction  (pseudonormalization).   2. Right ventricular systolic function is normal. The right ventricular  size is normal. There is normal pulmonary artery systolic pressure.   3. Left atrial size was mildly dilated.   4. The mitral valve is normal in structure. Trivial mitral valve  regurgitation. No evidence of mitral stenosis.   5. The aortic valve is tricuspid. There is moderate calcification of the  aortic valve. Aortic valve regurgitation is mild. Moderate to severe  aortic valve stenosis. Aortic valve area, by VTI measures 0.94 cm. Aortic  valve mean gradient measures 32.0  mmHg. Aortic valve Vmax measures 3.86 m/s.   6. Aortic dilatation noted. There is mild dilatation of the ascending  aorta, measuring 41 mm.   7. The inferior vena cava is normal in size with greater than 50%  respiratory variability, suggesting right atrial pressure of 3 mmHg.     Neuro/Psych  PSYCHIATRIC  DISORDERS Anxiety Depression    negative neurological ROS     GI/Hepatic negative GI ROS, Neg liver ROS,,,  Endo/Other  negative endocrine ROS    Renal/GU Renal InsufficiencyRenal disease  negative genitourinary   Musculoskeletal  (+) Arthritis ,    Abdominal   Peds negative pediatric ROS (+)  Hematology negative hematology ROS (+)   Anesthesia Other Findings   Reproductive/Obstetrics negative OB ROS                             Anesthesia Physical Anesthesia Plan  ASA: 4  Anesthesia Plan: MAC   Post-op Pain Management:    Induction: Intravenous  PONV Risk Score and Plan: 1 and Propofol infusion and Treatment may vary due to age or medical condition  Airway Management Planned: Natural Airway and Simple Face Mask  Additional Equipment:   Intra-op Plan:   Post-operative Plan:   Informed Consent: I have reviewed the patients History and Physical, chart, labs and discussed the procedure including the risks, benefits and alternatives for the proposed anesthesia with the patient or authorized representative who has indicated his/her understanding and acceptance.     Dental advisory given  Plan Discussed with: CRNA  Anesthesia Plan Comments:         Anesthesia Quick Evaluation

## 2024-01-30 NOTE — Op Note (Signed)
 [facility]   MULTIDISCIPLINARY HEART VALVE TEAM     TAVR OPERATIVE NOTE   NAME@ [id number]  Date of Procedure:                 01/30/2024   Preoperative Diagnosis:      Severe Aortic Stenosis    Postoperative Diagnosis:    Same    Procedure:        Transcatheter Aortic Valve Replacement - Percutaneous Right Transfemoral Approach             Medtronic Evolut FX + (size 29 mm, serial # [id number])              Co-Surgeons:            Dorise LOIS Fellers, MD and Ozell Fell, MD     Anesthesiologist:                  DOROTHA Peoples, MD   Echocardiographer:              EMERSON Balding, MD   Pre-operative Echo Findings: Severe aortic stenosis  Normal left ventricular systolic function   Post-operative Echo Findings: No paravalvular leak Normal left ventricular systolic function      BRIEF CLINICAL NOTE AND INDICATIONS FOR SURGERY    This 78 year old gentleman has stage D, severe, symptomatic aortic stenosis with NYHA class IIl symptoms of exertional fatigue and shortness of breath consistent with chronic diastolic congestive heart failure.  He has also been having frequent episodes of dizziness and lightheadedness.  I have personally reviewed his echo, cath, and CTA images.  His echo shows a moderately calcified aortic valve with restricted leaflet mobility.  The mean gradient was measured at 32 mmHg with an aortic valve area of 0.94 cm by VTI.  Left ventricular ejection fraction was 60 to 65% with a stroke-volume index of 31.  Cardiac catheterization showed patent left circumflex and right coronary stents.  The vein graft to the obtuse marginal 1 and 2 was known to be occluded.  The LIMA to the LAD is known to be a atretic and was not imaged.  There was competitive flow seen in the distal LAD.  Gated coronary CTA shows a trileaflet but functionally bicuspid aortic valve with partial fusion of the left and right cusps.  The valve looks severely stenotic with an aortic valve  calcium  score of 2012.  The small LIMA to the LAD appears patent.  There was an occluded saphenous vein graft to the obtuse marginal branches   I agree that aortic valve replacement is indicated in this patient for relief of his worsening symptoms and to prevent progressive left ventricular dysfunction.  His gated cardiac CTA shows anatomy suitable for TAVR using a 23 mm Edwards SAPIEN 3 valve or a 26 mm Medtronic valve.  Given his BMI of 37 with a BSA of 2.1 I think a Medtronic valve would be the best option for him.  His abdominal and pelvic CTA shows adequate pelvic vascular anatomy to allow transfemoral insertion on the right side.   The patient was counseled at length regarding treatment alternatives for management of severe symptomatic aortic stenosis. The risks and benefits of surgical intervention has been discussed in detail. Long-term prognosis with medical therapy was discussed. Alternative approaches such as conventional surgical aortic valve replacement, transcatheter aortic valve replacement, and palliative medical therapy were compared and contrasted at length. This discussion was placed in the context of the patient's own specific  clinical presentation and past medical history. All of his questions have been addressed.    Following the decision to proceed with transcatheter aortic valve replacement, a discussion was held regarding what types of management strategies would be attempted intraoperatively in the event of life-threatening complications, including whether or not the patient would be considered a candidate for the use of cardiopulmonary bypass and/or conversion to open sternotomy for attempted surgical intervention.  Given his age, prior coronary bypass surgery, and comorbidities I do not think he is a candidate for emergent sternotomy to manage any intraoperative complications.  The patient has been advised of a variety of complications that might develop including but not limited to  risks of death, stroke, paravalvular leak, aortic dissection or other major vascular complications, aortic annulus rupture, device embolization, cardiac rupture or perforation, mitral regurgitation, acute myocardial infarction, arrhythmia, heart block or bradycardia requiring permanent pacemaker placement, congestive heart failure, respiratory failure, renal failure, pneumonia, infection, other late complications related to structural valve deterioration or migration, or other complications that might ultimately cause a temporary or permanent loss of functional independence or other long term morbidity. The patient provides full informed consent for the procedure as described and all questions were answered.           DETAILS OF THE OPERATIVE PROCEDURE    PREPARATION:    The patient was brought to the operating room on the above mentioned date and appropriate monitoring was established by the anesthesia team. The patient was placed in the supine position on the operating table.  Intravenous antibiotics were administered. The patient was monitored closely throughout the procedure under conscious sedation.    Baseline transthoracic echocardiogram was performed. The patient's abdomen and both groins were prepped and draped in a sterile manner. A time out procedure was performed.   PERIPHERAL ACCESS:    Using the modified Seldinger technique, femoral arterial and venous access were obtained with placement of 6 Fr sheaths on the left side.  A pigtail diagnostic catheter was passed through the left arterial sheath under fluoroscopic guidance into the aortic root.  A temporary transvenous pacemaker catheter was passed through the left femoral venous sheath under fluoroscopic guidance into the right ventricle.  The pacemaker was tested to ensure stable lead placement and pacemaker capture. Aortic root angiography was performed in order to determine the optimal angiographic angle for valve deployment.     TRANSFEMORAL ACCESS:    Percutaneous transfemoral access and sheath placement was performed using ultrasound guidance.  The right common femoral artery was cannulated using a micropuncture needle.  A pair of Abbott Perclose percutaneous closure devices were placed and a 6 French sheath replaced into the femoral artery.  The patient was heparinized systemically and ACT verified > 250 seconds.     An 14 Fr transfemoral Gore Dry-Seal sheath was introduced into the right femoral artery over an Amplatz superstiff wire. An AL-2 catheter was used to direct a straight-tip exchange length wire across the native aortic valve into the left ventricle. This was exchanged out for a pigtail catheter and position was confirmed in the LV apex. Simultaneous LV and Ao pressures were recorded.  The pigtail catheter was exchanged for a Safari wire in the LV apex.    BALLOON AORTIC VALVULOPLASTY:    Performed using a 22 x 4.5 Fr True balloon while rapid ventricular pacing. Recovery was rapid.   TRANSCATHETER HEART VALVE DEPLOYMENT:    A Medtronic Evolut FX +transcatheter heart valve (size 29 mm) was prepared and  loaded into the delivery catheter system per manufacturer's guidelines and the proper orientation of the valve is confirmed under fluoroscopy. The delivery system and inline sheath were inserted into the right common femoral artery over the [facility] wire and the inline sheath advanced into the abdominal aorta under fluoroscopic guidance. The delivery catheter was advanced around the aortic arch and the valve was carefully positioned across the aortic valve annulus. An aortic root injection was performed to confirm position and the valve deployed using cusp overlap technique under fluoroscopic guidance. Intermittent pacing was used during valve deployment. The delivery system and guidewire were retracted into the descending aorta and the nosecone re-sheathed. Valve function was assessed using echocardiography. There is  felt to be no paravalvular leak and no central aortic insufficiency. The patient's hemodynamic recovery following valve deployment is good.        PROCEDURE COMPLETION:    The delivery system and in-line sheath were removed and femoral artery closure performed using the Perclose devices.  Protamine was administered once femoral arterial repair was complete. The temporary pacemaker, pigtail catheters and femoral sheaths were removed with manual pressure used venous for hemostasis and a Mynx closure device for contralateral arterial hemostasis.    The patient tolerated the procedure well and is transported to the cath lab recovery area in stable condition. There were no immediate intraoperative complications. All sponge instrument and needle counts are verified correct at completion of the operation.    No blood products were administered during the operation.   The patient received a total of 35 mL of intravenous contrast during the procedure.     Dorise MARLA Fellers, MD

## 2024-01-30 NOTE — Progress Notes (Signed)
 Pt's  BP trending up sBP 1750-165/95-110. Pain treated and bladder drained with no improvement. Dr. Wonda paged. Orders to come.

## 2024-01-30 NOTE — Discharge Instructions (Addendum)
ACTIVITY AND EXERCISE  Daily activity and exercise are an important part of your recovery. People recover at different rates depending on their general health and type of valve procedure.  Most people recovering from TAVR feel better relatively quickly   No lifting, pushing, pulling more than 10 pounds (examples to avoid: groceries, vacuuming, gardening, golfing):             - For one week with a procedure through the groin.             - For six weeks for procedures through the chest wall or neck. NOTE: You will typically see one of our providers 7-14 days after your procedure to discuss WHEN TO RESUME the above activities.      DRIVING  Do not drive until you are seen for follow up and cleared by a provider. Generally, we ask patient to not drive for 1 week after their procedure.  If you have been told by your doctor in the past that you may not drive, you must talk with him/her before you begin driving again.   DRESSING  Groin site: you may leave the clear dressing over the site for up to one week or until it falls off.   HYGIENE  If you had a femoral (leg) procedure, you may take a shower when you return home. After the shower, pat the site dry. Do NOT use powder, oils or lotions in your groin area until the site has completely healed.  If you had a chest procedure, you may shower when you return home unless specifically instructed not to by your discharging practitioner.             - DO NOT scrub incision; pat dry with a towel.             - DO NOT apply any lotions, oils, powders to the incision.             - No tub baths / swimming for at least 2 weeks.  If you notice any fevers, chills, increased pain, swelling, bleeding or pus, please contact your doctor.   ADDITIONAL INFORMATION  If you are going to have an upcoming dental procedure, please contact our office as you will require antibiotics ahead of time to prevent infection on your heart valve.    If you have any questions  or concerns you can call the structural heart phone during normal business hours 8am-4pm. If you have an urgent need after hours or weekends please call [phone] to talk to the on call provider for general cardiology. If you have an emergency that requires immediate attention, please call 911.    After TAVR Checklist  Check  Test Description   Follow up appointment in 1-2 weeks  You will see our structural heart advanced practice provider. Your incision sites will be checked and you will be cleared to drive and resume all normal activities if you are doing well.     1 month echo and follow up  You will have an echo to check on your new heart valve and be seen back in the office by a structural heart advanced practice provider.   Follow up with your primary cardiologist You will need to be seen by your primary cardiologist in the following 3-6 months after your 1 month appointment in the valve clinic.    1 year echo and follow up You will have another echo to check on your heart valve after 1 year   and be seen back in the office by a structural heart advanced practice provider. This your last structural heart visit.   Bacterial endocarditis prophylaxis  You will have to take antibiotics for the rest of your life before all dental procedures (even teeth cleanings) to protect your heart valve. Antibiotics are also required before some surgeries. Please check with your cardiologist before scheduling any surgeries. Also, please make sure to tell us if you have a penicillin allergy as you will require an alternative antibiotic.   ' =======================================  Information on my medicine - ELIQUIS (apixaban)  This medication education was reviewed with me or my healthcare representative as part of my discharge preparation.     Why was Eliquis prescribed for you? Eliquis was prescribed for you to reduce the risk of a blood clot forming that can cause a stroke if you have a medical  condition called atrial fibrillation (a type of irregular heartbeat).  What do You need to know about Eliquis ? Take your Eliquis TWICE DAILY - one tablet in the morning and one tablet in the evening with or without food. If you have difficulty swallowing the tablet whole please discuss with your pharmacist how to take the medication safely.  Take Eliquis exactly as prescribed by your doctor and DO NOT stop taking Eliquis without talking to the doctor who prescribed the medication.  Stopping may increase your risk of developing a stroke.  Refill your prescription before you run out.  After discharge, you should have regular check-up appointments with your healthcare provider that is prescribing your Eliquis.  In the future your dose may need to be changed if your kidney function or weight changes by a significant amount or as you get older.  What do you do if you miss a dose? If you miss a dose, take it as soon as you remember on the same day and resume taking twice daily.  Do not take more than one dose of ELIQUIS at the same time to make up a missed dose.  Important Safety Information A possible side effect of Eliquis is bleeding. You should call your healthcare provider right away if you experience any of the following: Bleeding from an injury or your nose that does not stop. Unusual colored urine (red or dark brown) or unusual colored stools (red or black). Unusual bruising for unknown reasons. A serious fall or if you hit your head (even if there is no bleeding).  Some medicines may interact with Eliquis and might increase your risk of bleeding or clotting while on Eliquis. To help avoid this, consult your healthcare provider or pharmacist prior to using any new prescription or non-prescription medications, including herbals, vitamins, non-steroidal anti-inflammatory drugs (NSAIDs) and supplements.  This website has more information on Eliquis (apixaban):  http://[email or web address].com/eliquis/home

## 2024-01-31 ENCOUNTER — Inpatient Hospital Stay (HOSPITAL_COMMUNITY)

## 2024-01-31 ENCOUNTER — Ambulatory Visit (INDEPENDENT_AMBULATORY_CARE_PROVIDER_SITE_OTHER)

## 2024-01-31 ENCOUNTER — Other Ambulatory Visit: Payer: Self-pay | Admitting: Physician Assistant

## 2024-01-31 DIAGNOSIS — Z952 Presence of prosthetic heart valve: Secondary | ICD-10-CM

## 2024-01-31 DIAGNOSIS — I130 Hypertensive heart and chronic kidney disease with heart failure and stage 1 through stage 4 chronic kidney disease, or unspecified chronic kidney disease: Secondary | ICD-10-CM

## 2024-01-31 DIAGNOSIS — I480 Paroxysmal atrial fibrillation: Secondary | ICD-10-CM

## 2024-01-31 DIAGNOSIS — E1169 Type 2 diabetes mellitus with other specified complication: Secondary | ICD-10-CM

## 2024-01-31 DIAGNOSIS — Z954 Presence of other heart-valve replacement: Secondary | ICD-10-CM

## 2024-01-31 LAB — CBC
HCT: 43.7 % (ref 39.0–52.0)
Hemoglobin: 13.6 g/dL (ref 13.0–17.0)
MCH: 28.1 pg (ref 26.0–34.0)
MCHC: 31.1 g/dL (ref 30.0–36.0)
MCV: 90.3 fL (ref 80.0–100.0)
Platelets: 158 10*3/uL (ref 150–400)
RBC: 4.84 MIL/uL (ref 4.22–5.81)
RDW: 17.5 % — ABNORMAL HIGH (ref 11.5–15.5)
WBC: 8.8 10*3/uL (ref 4.0–10.5)
nRBC: 0 % (ref 0.0–0.2)

## 2024-01-31 LAB — GLUCOSE, CAPILLARY
Glucose-Capillary: 84 mg/dL (ref 70–99)
Glucose-Capillary: 91 mg/dL (ref 70–99)
Glucose-Capillary: 106 mg/dL — ABNORMAL HIGH (ref 70–99)
Glucose-Capillary: 147 mg/dL — ABNORMAL HIGH (ref 70–99)

## 2024-01-31 LAB — BASIC METABOLIC PANEL WITH GFR
Anion gap: 7 (ref 5–15)
BUN: 19 mg/dL (ref 8–23)
CO2: 29 mmol/L (ref 22–32)
Calcium: 9.5 mg/dL (ref 8.9–10.3)
Chloride: 103 mmol/L (ref 98–111)
Creatinine, Ser: 1.39 mg/dL — ABNORMAL HIGH (ref 0.61–1.24)
GFR, Estimated: 52 mL/min — ABNORMAL LOW (ref >60)
Glucose, Bld: 78 mg/dL (ref 70–99)
Potassium: 4.7 mmol/L (ref 3.5–5.1)
Sodium: 139 mmol/L (ref 135–145)

## 2024-01-31 LAB — ECHOCARDIOGRAM COMPLETE
AR max vel: 1.88 cm2
AV Area VTI: 1.79 cm2
AV Area mean vel: 1.88 cm2
AV Mean grad: 10 mmHg
AV Peak grad: 19.8 mmHg
Ao pk vel: 2.23 m/s
Area-P 1/2: 5.5 cm2
Calc EF: 68.6 %
Height: 66 in
S' Lateral: 3.2 cm
Single Plane A2C EF: 66.1 %
Single Plane A4C EF: 69 %
Weight: 3622.6 [oz_av]

## 2024-01-31 LAB — MAGNESIUM: Magnesium: 1.8 mg/dL (ref 1.7–2.4)

## 2024-01-31 MED ORDER — FUROSEMIDE 10 MG/ML IJ SOLN
40.0000 mg | Freq: Two times a day (BID) | INTRAMUSCULAR | Status: DC
  Administered 2024-01-31 – 2024-02-01 (×3): 40 mg via INTRAVENOUS
  Filled 2024-01-31 (×3): qty 4

## 2024-01-31 MED ORDER — ORAL CARE MOUTH RINSE
15.0000 mL | OROMUCOSAL | Status: DC | PRN
Start: 2024-01-31 — End: 2024-02-07

## 2024-01-31 MED ORDER — POTASSIUM CHLORIDE CRYS ER 20 MEQ PO TBCR
20.0000 meq | EXTENDED_RELEASE_TABLET | Freq: Two times a day (BID) | ORAL | Status: DC
  Administered 2024-01-31 – 2024-02-01 (×3): 20 meq via ORAL
  Filled 2024-01-31 (×3): qty 1

## 2024-01-31 NOTE — Progress Notes (Signed)
   01/31/24 9378  Urine Measurement/Characteristics  Urinary Interventions Bladder scan  Bladder Scan Volume (mL) 227 mL   Pt with no spontaneous void throughout shift. Day Shift RN updated to convey to MD on rounds.

## 2024-01-31 NOTE — Progress Notes (Signed)
 1 Day Post-Op Procedure(s) (LRB): Transcatheter Aortic Valve Replacement, Transfemoral (N/A) ECHOCARDIOGRAM, TRANSTHORACIC (N/A) Subjective:  Feels well. Has not been up walking postop yet.  AV paced 60 on monitor.  Objective: Vital signs in last 24 hours: Temp:  [97.6 F (36.4 C)-98.7 F (37.1 C)] 98.7 F (37.1 C) (07/02 0740) Pulse Rate:  [0-72] 61 (07/02 0740) Cardiac Rhythm: Atrial paced (07/02 0716) Resp:  [8-20] 15 (07/02 0740) BP: (110-183)/(75-109) 150/81 (07/02 0740) SpO2:  [92 %-99 %] 95 % (07/02 0740) Weight:  [95.3 kg-102.7 kg] 102.7 kg (07/02 0500)  Hemodynamic parameters for last 24 hours:    Intake/Output from previous day: 07/01 0701 - 07/02 0700 In: 1735 [P.O.:480; I.V.:955; IV Piggyback:300] Out: 535 [Urine:525; Blood:10] Intake/Output this shift: No intake/output data recorded.  General appearance: alert and cooperative Neurologic: intact Heart: regular rate and rhythm Lungs: clear to auscultation bilaterally Extremities: warm, no edema Wound: groin sites ok  Lab Results: Recent Labs    01/30/24 1554 01/31/24 0300  WBC  --  8.8  HGB 13.6 13.6  HCT 40.0 43.7  PLT  --  158   BMET:  Recent Labs    01/30/24 1554 01/31/24 0300  NA 141 139  K 4.1 4.7  CL 104 103  CO2  --  29  GLUCOSE 132* 78  BUN 23 19  CREATININE 1.40* 1.39*  CALCIUM   --  9.5    PT/INR: No results for input(s): LABPROT, INR in the last 72 hours. ABG    Component Value Date/Time   PHART 7.305 (L) 12/21/2023 1044   HCO3 25.0 12/21/2023 1057   TCO2 25 01/30/2024 1554   ACIDBASEDEF 2.0 12/21/2023 1057   O2SAT 69 12/21/2023 1057   CBG (last 3)  Recent Labs    01/30/24 2149 01/31/24 0622  GLUCAP 141* 84    Assessment/Plan: S/P Procedure(s) (LRB): Transcatheter Aortic Valve Replacement, Transfemoral (N/A) ECHOCARDIOGRAM, TRANSTHORACIC (N/A)  POD 1 Hemodynamics stable but some HTN. His previous meds have been resumed.  2D echo pending this am.  He  needs to get up ambulating. He lives alone at home and is planning on his landlord giving him a hand. I think he needs to be ambulating well before going home.   LOS: 1 day    Dorise MARLA Fellers 01/31/2024

## 2024-01-31 NOTE — Progress Notes (Signed)
 Discussed with pt restrictions, exercise, and  CRPII. Pt receptive. Will refer to [place] CRPII. MT is preparing to ambulate with him. He has been to BR. [id number] Aliene Aris BS, ACSM-CEP 01/31/2024 9:51 AM

## 2024-01-31 NOTE — Progress Notes (Addendum)
  [facility]   MULTIDISCIPLINARY HEART VALVE TEAM  Pt did get up to walk and felt dizzy and quite SOB. He was noted to have an elevated LVEDP at the time of TAVR ~24 mm hg. Will plan to keep him and diurese him. Will try with Lasix  40mg  BID and KDur 20meq BID and see how he feels in the morning. Home antihypertensive regimen and Eliquis  resumed.'   Lamarr Hummer PA-C  [facility]

## 2024-01-31 NOTE — Progress Notes (Signed)
 Mobility Specialist Progress Note:   01/31/24 0955  Mobility  Activity Ambulated with assistance in room;Ambulated with assistance to bathroom;Ambulated with assistance in hallway  Level of Assistance Contact guard assist, steadying assist  Assistive Device Cane  Distance Ambulated (ft) 150 ft  Activity Response Tolerated well  Mobility Referral Yes  Mobility visit 1 Mobility  Mobility Specialist Start Time (ACUTE ONLY) 0955  Mobility Specialist Stop Time (ACUTE ONLY) 1005  Mobility Specialist Time Calculation (min) (ACUTE ONLY) 10 min   Pt received in bed, agreeable to mobility session. Ambulated with CGA and Cane. Tolerated well, c/o dizziness. BP 169/90 (112). Audible SOB throughout, SpO2 97% on RA. Returned pt to bed, left with all needs met.  Sovereign Ramiro Mobility Specialist Please contact via Special educational needs teacher or  Rehab office at [phone]

## 2024-02-01 ENCOUNTER — Ambulatory Visit: Payer: Self-pay | Admitting: Cardiovascular Disease

## 2024-02-01 DIAGNOSIS — N1832 Chronic kidney disease, stage 3b: Secondary | ICD-10-CM

## 2024-02-01 LAB — CBC
HCT: 43.3 % (ref 39.0–52.0)
Hemoglobin: 13.7 g/dL (ref 13.0–17.0)
MCH: 28.4 pg (ref 26.0–34.0)
MCHC: 31.6 g/dL (ref 30.0–36.0)
MCV: 89.6 fL (ref 80.0–100.0)
Platelets: 140 10*3/uL — ABNORMAL LOW (ref 150–400)
RBC: 4.83 MIL/uL (ref 4.22–5.81)
RDW: 17.7 % — ABNORMAL HIGH (ref 11.5–15.5)
WBC: 9.5 10*3/uL (ref 4.0–10.5)
nRBC: 0 % (ref 0.0–0.2)

## 2024-02-01 LAB — GLUCOSE, CAPILLARY
Glucose-Capillary: 121 mg/dL — ABNORMAL HIGH (ref 70–99)
Glucose-Capillary: 143 mg/dL — ABNORMAL HIGH (ref 70–99)
Glucose-Capillary: 96 mg/dL (ref 70–99)
Glucose-Capillary: 97 mg/dL (ref 70–99)

## 2024-02-01 LAB — BASIC METABOLIC PANEL WITH GFR
Anion gap: 12 (ref 5–15)
BUN: 17 mg/dL (ref 8–23)
CO2: 27 mmol/L (ref 22–32)
Calcium: 10.2 mg/dL (ref 8.9–10.3)
Chloride: 101 mmol/L (ref 98–111)
Creatinine, Ser: 1.79 mg/dL — ABNORMAL HIGH (ref 0.61–1.24)
GFR, Estimated: 39 mL/min — ABNORMAL LOW (ref >60)
Glucose, Bld: 89 mg/dL (ref 70–99)
Potassium: 3.9 mmol/L (ref 3.5–5.1)
Sodium: 140 mmol/L (ref 135–145)

## 2024-02-01 LAB — CUP PACEART REMOTE DEVICE CHECK
Battery Remaining Longevity: 126 mo
Battery Remaining Percentage: 100 %
Brady Statistic RA Percent Paced: 78 %
Brady Statistic RV Percent Paced: 0 %
Date Time Interrogation Session: 20250702124600
Implantable Lead Connection Status: 753985
Implantable Lead Connection Status: 753985
Implantable Lead Implant Date: 20220915
Implantable Lead Implant Date: 20220915
Implantable Lead Location: 753859
Implantable Lead Location: 753860
Implantable Lead Model: 4469
Implantable Lead Model: 4470
Implantable Lead Serial Number: 624816
Implantable Lead Serial Number: 877900
Implantable Pulse Generator Implant Date: 20220915
Lead Channel Impedance Value: 418 Ohm
Lead Channel Impedance Value: 489 Ohm
Lead Channel Pacing Threshold Amplitude: 0.4 V
Lead Channel Pacing Threshold Amplitude: 0.7 V
Lead Channel Pacing Threshold Pulse Width: 0.4 ms
Lead Channel Pacing Threshold Pulse Width: 0.4 ms
Lead Channel Setting Pacing Amplitude: 3.5 V
Lead Channel Setting Pacing Amplitude: 3.5 V
Lead Channel Setting Pacing Pulse Width: 0.4 ms
Lead Channel Setting Sensing Sensitivity: 2.5 mV
Pulse Gen Serial Number: 542710
Zone Setting Status: 755011

## 2024-02-01 NOTE — Progress Notes (Signed)
 2 Days Post-Op Procedure(s) (LRB): Transcatheter Aortic Valve Replacement, Transfemoral (N/A) ECHOCARDIOGRAM, TRANSTHORACIC (N/A) Subjective:  Ambulated some in hall yesterday but SOB, wheezing and dizzy. Diuresed overnight but who knows how much since we don't have I/O on 4E. Wt down 10 lbs from yesterday so we know wts aren't accurate here either.   Objective: Vital signs in last 24 hours: Temp:  [98.3 F (36.8 C)-100 F (37.8 C)] 98.3 F (36.8 C) (07/03 0322) Pulse Rate:  [60-67] 64 (07/03 0322) Cardiac Rhythm: Heart block (07/02 1913) Resp:  [15-20] 17 (07/03 0559) BP: (120-151)/(67-115) 138/76 (07/03 0322) SpO2:  [89 %-99 %] 99 % (07/03 0322) Weight:  [98 kg] 98 kg (07/03 0559)  Hemodynamic parameters for last 24 hours:    Intake/Output from previous day: No intake/output data recorded. Intake/Output this shift: No intake/output data recorded.  General appearance: alert and cooperative Neurologic: intact Heart: regular rate and rhythm Lungs: clear to auscultation bilaterally Extremities: no edema Wound: groin sites ok  Lab Results: Recent Labs    01/31/24 0300 02/01/24 0306  WBC 8.8 9.5  HGB 13.6 13.7  HCT 43.7 43.3  PLT 158 140*   BMET:  Recent Labs    01/31/24 0300 02/01/24 0306  NA 139 140  K 4.7 3.9  CL 103 101  CO2 29 27  GLUCOSE 78 89  BUN 19 17  CREATININE 1.39* 1.79*  CALCIUM  9.5 10.2    PT/INR: No results for input(s): LABPROT, INR in the last 72 hours. ABG    Component Value Date/Time   PHART 7.305 (L) 12/21/2023 1044   HCO3 25.0 12/21/2023 1057   TCO2 25 01/30/2024 1554   ACIDBASEDEF 2.0 12/21/2023 1057   O2SAT 69 12/21/2023 1057   CBG (last 3)  Recent Labs    01/31/24 1607 01/31/24 2110 02/01/24 0556  GLUCAP 106* 147* 121*    Assessment/Plan: S/P Procedure(s) (LRB): Transcatheter Aortic Valve Replacement, Transfemoral (N/A) ECHOCARDIOGRAM, TRANSTHORACIC (N/A)  POD 2  He is hemodynamically stable in paced rhythm.  Diuresed yesterday. Creat bumped to 1.79 probably related to contrast for procedure and diuretic. He has baseline stage 3b CKD. Repeat BMET in am. LVEF normal and valve looked good on Echo yesterday.  His baseline level of functioning at home is borderline. He uses oxygen  and bipap, walks with a walker. His only help is his landlord or lives in adjoining apt. I think his breathing needs to be improved and ambulating better before he can go home under these circumstances.  LOS: 2 days    Dorise MARLA Fellers 02/01/2024

## 2024-02-01 NOTE — Progress Notes (Signed)
 Mobility Specialist Progress Note;   02/01/24 1045  Mobility  Activity Ambulated with assistance to bathroom;Ambulated with assistance in room  Level of Assistance Contact guard assist, steadying assist  Assistive Device Cane  Distance Ambulated (ft) 15 ft  Activity Response Tolerated well  Mobility Referral Yes  Mobility visit 1 Mobility  Mobility Specialist Start Time (ACUTE ONLY) 1045  Mobility Specialist Stop Time (ACUTE ONLY) 1100  Mobility Specialist Time Calculation (min) (ACUTE ONLY) 15 min    Post-mobility: BP 153/104 (120)  Pt agreeable to mobility. Required MinG assistance during ambulation. Requested assistance to BR, void successful. C/o feeling dizzy, deferred further mobility for safety, BP taken (see above). Pt left comfortably in bed with all needs met, call bell in reach.   Lauraine Erm Mobility Specialist Please contact via Secure[employer] or [employer] [phone]

## 2024-02-02 LAB — CBC
HCT: 45.7 % (ref 39.0–52.0)
Hemoglobin: 14.6 g/dL (ref 13.0–17.0)
MCH: 28.5 pg (ref 26.0–34.0)
MCHC: 31.9 g/dL (ref 30.0–36.0)
MCV: 89.3 fL (ref 80.0–100.0)
Platelets: 138 K/uL — ABNORMAL LOW (ref 150–400)
RBC: 5.12 MIL/uL (ref 4.22–5.81)
RDW: 17.8 % — ABNORMAL HIGH (ref 11.5–15.5)
WBC: 10.3 K/uL (ref 4.0–10.5)
nRBC: 0 % (ref 0.0–0.2)

## 2024-02-02 LAB — BASIC METABOLIC PANEL WITH GFR
Anion gap: 9 (ref 5–15)
BUN: 23 mg/dL (ref 8–23)
CO2: 30 mmol/L (ref 22–32)
Calcium: 10.3 mg/dL (ref 8.9–10.3)
Chloride: 101 mmol/L (ref 98–111)
Creatinine, Ser: 1.74 mg/dL — ABNORMAL HIGH (ref 0.61–1.24)
GFR, Estimated: 40 mL/min — ABNORMAL LOW (ref >60)
Glucose, Bld: 79 mg/dL (ref 70–99)
Potassium: 4.6 mmol/L (ref 3.5–5.1)
Sodium: 140 mmol/L (ref 135–145)

## 2024-02-02 LAB — GLUCOSE, CAPILLARY
Glucose-Capillary: 98 mg/dL (ref 70–99)
Glucose-Capillary: 139 mg/dL — ABNORMAL HIGH (ref 70–99)
Glucose-Capillary: 150 mg/dL — ABNORMAL HIGH (ref 70–99)
Glucose-Capillary: 138 mg/dL — ABNORMAL HIGH (ref 70–99)

## 2024-02-02 MED ORDER — APIXABAN 5 MG PO TABS
5.0000 mg | ORAL_TABLET | Freq: Two times a day (BID) | ORAL | Status: DC
  Administered 2024-02-02 – 2024-02-07 (×11): 5 mg via ORAL
  Filled 2024-02-02 (×9): qty 1
  Filled 2024-02-02: qty 2
  Filled 2024-02-02: qty 1

## 2024-02-02 NOTE — Progress Notes (Signed)
 Mobility Specialist Progress Note:    02/02/24 1004  Orthostatic Lying   BP- Lying 119/76 (88)  Orthostatic Sitting  BP- Sitting 104/73  Pulse- Sitting 83  Orthostatic Standing at 3 minutes  BP- Standing at 3 minutes (!) 65/44 (52)  Mobility  Activity Ambulated with assistance in room;Transferred from bed to chair  Level of Assistance Minimal assist, patient does 75% or more  Assistive Device Front wheel walker  Distance Ambulated (ft) 15 ft  Activity Response Tolerated well  Mobility Referral Yes  Mobility visit 1 Mobility  Mobility Specialist Start Time (ACUTE ONLY) [id number]  Mobility Specialist Stop Time (ACUTE ONLY) 1001  Mobility Specialist Time Calculation (min) (ACUTE ONLY) 10 min   Pt received in bed, eager for mobility session. Pt reported dizziness. Took orthostatics, see flow sheet. MinA required to stand with RW. Tolerated well, able to walk around bed and stand in front of chair. During last BP, pt had episode of knee buckling. Pt sitting in chair with all needs met, call bell in reach. RN notified.    Ninfa Giannelli Mobility Specialist Please contact via Special educational needs teacher or  Rehab office at [phone]

## 2024-02-02 NOTE — Progress Notes (Addendum)
  [facility]   MULTIDISCIPLINARY HEART VALVE TEAM  Pt noted to have significant orthostatic hypotension with a drop to 64/44 when standing. He was symptomatic with this. He was told to watch fluid intake given heart failure but may have stopped drinking too much. Will stop his Coreg  and Imdur  for now and ask him to drink some fluids and see how he does.   Okay to allow permissive HTN for now and will add back meds as tolerated.   Lamarr Hummer PA-C  [facility]

## 2024-02-02 NOTE — Progress Notes (Signed)
 Mobility Specialist Progress Note:    02/02/24 1547  Mobility  Activity Ambulated with assistance in hallway  Level of Assistance Contact guard assist, steadying assist  Assistive Device Front wheel walker  Distance Ambulated (ft) 100 ft  Activity Response Tolerated well  Mobility Referral Yes  Mobility visit 1 Mobility  Mobility Specialist Start Time (ACUTE ONLY) 1501  Mobility Specialist Stop Time (ACUTE ONLY) 1526  Mobility Specialist Time Calculation (min) (ACUTE ONLY) 25 min    Supine BP: 118/83 (95) EOB BP: 122/101 (108) Standing BP: 112/81 (92)  Received in bed and agreeable to mobility. Pt c/o dizziness, took BP's (see above). Pt required MinA to EOB and MinG during ambulation. Returned to room without fault. Personal belongings and call light within reach. All needs met.   Lavanda Pollack Mobility Specialist  Please contact via [employer] or  Rehab Office [phone]

## 2024-02-02 NOTE — Progress Notes (Signed)
 3 Days Post-Op Procedure(s) (LRB): Transcatheter Aortic Valve Replacement, Transfemoral (N/A) ECHOCARDIOGRAM, TRANSTHORACIC (N/A) Subjective:  Says he got dizzy when he got up to walk yesterday. Still having hiccups intermittently.  Objective: Vital signs in last 24 hours: Temp:  [97.8 F (36.6 C)-99.7 F (37.6 C)] 98.5 F (36.9 C) (07/04 0820) Pulse Rate:  [60-72] 72 (07/04 0820) Cardiac Rhythm: Ventricular paced (07/04 0800) Resp:  [12-20] 18 (07/04 0820) BP: (107-134)/(74-96) 126/96 (07/04 0820) SpO2:  [96 %-100 %] 96 % (07/04 0820) Weight:  [96.4 kg-96.7 kg] 96.7 kg (07/04 0626)  Hemodynamic parameters for last 24 hours:    Intake/Output from previous day: 07/03 0701 - 07/04 0700 In: 540 [P.O.:540] Out: -  Intake/Output this shift: Total I/O In: -  Out: 100 [Urine:100]  General appearance: alert and cooperative Neurologic: intact Heart: regular rate and rhythm Lungs: clear to auscultation bilaterally Extremities: no edema Wound: groin sites ok  Lab Results: Recent Labs    02/01/24 0306 02/02/24 0306  WBC 9.5 10.3  HGB 13.7 14.6  HCT 43.3 45.7  PLT 140* 138*   BMET:  Recent Labs    02/01/24 0306 02/02/24 0306  NA 140 140  K 3.9 4.6  CL 101 101  CO2 27 30  GLUCOSE 89 79  BUN 17 23  CREATININE 1.79* 1.74*  CALCIUM  10.2 10.3    PT/INR: No results for input(s): LABPROT, INR in the last 72 hours. ABG    Component Value Date/Time   PHART 7.305 (L) 12/21/2023 1044   HCO3 25.0 12/21/2023 1057   TCO2 25 01/30/2024 1554   ACIDBASEDEF 2.0 12/21/2023 1057   O2SAT 69 12/21/2023 1057   CBG (last 3)  Recent Labs    02/01/24 1656 02/01/24 2106 02/02/24 0630  GLUCAP 96 97 98    Assessment/Plan: S/P Procedure(s) (LRB): Transcatheter Aortic Valve Replacement, Transfemoral (N/A) ECHOCARDIOGRAM, TRANSTHORACIC (N/A)  POD 3 He has been hemodynamically stable in paced rhythm at 60. Not sure why he gets dizzy when up ambulating. He only walked 15  ft yesterday. He needs to continue to try ambulating. I suspect his baseline level of functioning is poor.  Creat has reached plateau and down slightly this am. Will check BMET tomorrow.     LOS: 3 days    Dorise MARLA Fellers 02/02/2024

## 2024-02-02 NOTE — Evaluation (Addendum)
 Physical Therapy Evaluation Patient Details Name: Johnny [name] MRN: [id number] DOB: 1945-11-11 Today's Date: 02/02/2024  History of Present Illness  Pt is a 78 y/o male presenting with worsening exertional SOB and fatigue, also having frequent episodes of lightheadedness and dizziness.  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  PMH: SSS s/p BSCI DC PPM (DOI 04/2021), paroxysmal AF, CAD s/p CABG, PCI, moderate AS, HFpEF, severe OSA, CKD3, DM2, HLD and HTN  Clinical Impression  Pt admitted with/for worsening SOB and fatigue, Pt presently needing varying degrees of mod assist from bed mobility, STS and lighter mod assist for gait with the RW.  Pt currently limited functionally due to the problems listed. ( See problems list.)   Pt will benefit from PT to maximize function and safety in order to get ready for next venue listed below.  Patient could benefit from continued inpatient follow up therapy, <3 hours/day if he could be persuaded.          If plan is discharge home, recommend the following: A little help with walking and/or transfers;A little help with bathing/dressing/bathroom;Assistance with cooking/housework;Assist for transportation;Help with stairs or ramp for entrance   Can travel by private vehicle   No    Equipment Recommendations None recommended by PT  Recommendations for Other Services       Functional Status Assessment Patient has had a recent decline in their functional status and demonstrates the ability to make significant improvements in function in a reasonable and predictable amount of time.     Precautions / Restrictions Precautions Precautions: Fall Recall of Precautions/Restrictions: Intact      Mobility  Bed Mobility Overal bed mobility: Needs Assistance Bed Mobility: Supine to Sit, Sit to Supine     Supine to sit: Mod assist, HOB elevated, Used rails Sit to supine: Mod assist, Max assist   General bed mobility comments: Truncal assist come up toward EOB and LE  transitioning into bed.    Transfers Overall transfer level: Needs assistance Equipment used: None, Rolling walker (2 wheels) Transfers: Sit to/from Stand Sit to Stand: Min assist           General transfer comment: min steady assist, cues for hand placement    Ambulation/Gait Ambulation/Gait assistance: Min assist Gait Distance (Feet): 60 Feet (x2 with RW and consistent stability assist) Assistive device: Rolling walker (2 wheels) Gait Pattern/deviations: Step-through pattern   Gait velocity interpretation: <1.31 ft/sec, indicative of household ambulator   General Gait Details: weak-kneed gait, moderate use of the RW.  Sats on RA in the lower 90's,  HR in the lower 90's bpm  Stairs            Wheelchair Mobility     Tilt Bed    Modified Rankin (Stroke Patients Only)       Balance Overall balance assessment: Needs assistance Sitting-balance support: No upper extremity supported, Single extremity supported, Feet supported Sitting balance-Leahy Scale: Fair       Standing balance-Leahy Scale: Poor Standing balance comment: reliant on AD or external support                             Pertinent Vitals/Pain Pain Assessment Pain Assessment: Faces Faces Pain Scale: Hurts a little bit Pain Location: general aches Pain Descriptors / Indicators: Aching, Sore Pain Intervention(s): Monitored during session    Home Living Family/patient expects to be discharged to:: Private residence Living Arrangements: Alone Available Help at Discharge:  ([facility] is primary)  Type of Home: House Home Access: Stairs to enter Entrance Stairs-Rails: Doctor, general practice of Steps: several   Home Layout: One level Home Equipment: Rollator (4 wheels);Cane - single point;Shower seat;Grab bars - toilet      Prior Function Prior Level of Function : Independent/Modified Independent                     Extremity/Trunk Assessment   Upper  Extremity Assessment Upper Extremity Assessment:  (R shd disfunction, bil mild weaknesses)    Lower Extremity Assessment Lower Extremity Assessment: Generalized weakness (bil proximal weakness)    Cervical / Trunk Assessment Cervical / Trunk Assessment: Kyphotic  Communication   Communication Communication: No apparent difficulties    Cognition Arousal: Alert Behavior During Therapy: WFL for tasks assessed/performed   PT - Cognitive impairments: No family/caregiver present to determine baseline                                 Cueing Cueing Techniques: Verbal cues     General Comments      Exercises     Assessment/Plan    PT Assessment Patient needs continued PT services  PT Problem List Decreased strength;Decreased activity tolerance;Decreased balance;Decreased mobility;Decreased coordination;Cardiopulmonary status limiting activity       PT Treatment Interventions      PT Goals (Current goals can be found in the Care Plan section)  Acute Rehab PT Goals PT Goal Formulation: Patient unable to participate in goal setting Time For Goal Achievement: 02/09/24 Potential to Achieve Goals: Good    Frequency Min 3X/week     Co-evaluation               AM-PAC PT 6 Clicks Mobility  Outcome Measure Help needed turning from your back to your side while in a flat bed without using bedrails?: A Lot Help needed moving from lying on your back to sitting on the side of a flat bed without using bedrails?: A Lot Help needed moving to and from a bed to a chair (including a wheelchair)?: A Little Help needed standing up from a chair using your arms (e.g., wheelchair or bedside chair)?: A Little Help needed to walk in hospital room?: A Little Help needed climbing 3-5 steps with a railing? : A Lot 6 Click Score: 15    End of Session   Activity Tolerance: Patient limited by fatigue Patient left: in bed;in CPM Nurse Communication: Mobility status PT Visit  Diagnosis: Unsteadiness on feet (R26.81);Other abnormalities of gait and mobility (R26.89);Difficulty in walking, not elsewhere classified (R26.2)    Time: 8471-8447 PT Time Calculation (min) (ACUTE ONLY): 24 min   Charges:   PT Evaluation $PT Eval Moderate Complexity: 1 Mod PT Treatments $Gait Training: 8-22 mins PT General Charges $$ ACUTE PT VISIT: 1 Visit     02/02/2024  Johnny HERO., PT Acute Rehabilitation Services [phone]  (office)    02/02/2024  Johnny HERO., PT Acute Rehabilitation Services [phone]  (office)  Johnny [name] 02/02/2024, 5:02 PM

## 2024-02-02 NOTE — Plan of Care (Signed)
   Problem: Nutrition: Goal: Adequate nutrition will be maintained Outcome: Progressing

## 2024-02-03 ENCOUNTER — Inpatient Hospital Stay (HOSPITAL_COMMUNITY)

## 2024-02-03 LAB — BASIC METABOLIC PANEL WITH GFR
Anion gap: 9 (ref 5–15)
BUN: 23 mg/dL (ref 8–23)
CO2: 28 mmol/L (ref 22–32)
Calcium: 10.4 mg/dL — ABNORMAL HIGH (ref 8.9–10.3)
Chloride: 102 mmol/L (ref 98–111)
Creatinine, Ser: 1.71 mg/dL — ABNORMAL HIGH (ref 0.61–1.24)
GFR, Estimated: 41 mL/min — ABNORMAL LOW (ref >60)
Glucose, Bld: 90 mg/dL (ref 70–99)
Potassium: 4.3 mmol/L (ref 3.5–5.1)
Sodium: 139 mmol/L (ref 135–145)

## 2024-02-03 LAB — GLUCOSE, CAPILLARY
Glucose-Capillary: 93 mg/dL (ref 70–99)
Glucose-Capillary: 121 mg/dL — ABNORMAL HIGH (ref 70–99)
Glucose-Capillary: 79 mg/dL (ref 70–99)
Glucose-Capillary: 97 mg/dL (ref 70–99)

## 2024-02-03 LAB — CBC
HCT: 46.5 % (ref 39.0–52.0)
Hemoglobin: 14.8 g/dL (ref 13.0–17.0)
MCH: 28.4 pg (ref 26.0–34.0)
MCHC: 31.8 g/dL (ref 30.0–36.0)
MCV: 89.1 fL (ref 80.0–100.0)
Platelets: 157 K/uL (ref 150–400)
RBC: 5.22 MIL/uL (ref 4.22–5.81)
RDW: 17.6 % — ABNORMAL HIGH (ref 11.5–15.5)
WBC: 9 K/uL (ref 4.0–10.5)
nRBC: 0 % (ref 0.0–0.2)

## 2024-02-03 NOTE — Progress Notes (Signed)
 Mobility Specialist Progress Note:    02/03/24 1020  Mobility  Activity Ambulated with assistance to bathroom;Ambulated with assistance in room;Ambulated with assistance in hallway  Level of Assistance Standby assist, set-up cues, supervision of patient - no hands on  Assistive Device Front wheel walker  Distance Ambulated (ft) 100 ft  Activity Response Tolerated well  Mobility Referral Yes  Mobility visit 1 Mobility  Mobility Specialist Start Time (ACUTE ONLY) 1010  Mobility Specialist Stop Time (ACUTE ONLY) 1020  Mobility Specialist Time Calculation (min) (ACUTE ONLY) 10 min   Pt received in bathroom, agreeable to ambulate in hallway. Tolerated well, experienced uncontrollable hic-ups during session. VSS throughout. Returned pt to room, sitting EOB with all needs met.  Devin Foskey Mobility Specialist Please contact via [employer] or  Rehab office at [phone]

## 2024-02-03 NOTE — Plan of Care (Signed)

## 2024-02-03 NOTE — Progress Notes (Signed)
 Pt ambulated x 150 feet around the unit with front wheel walker, has a some what unsteady gait

## 2024-02-03 NOTE — Progress Notes (Signed)
 4 Days Post-Op Procedure(s) (LRB): Transcatheter Aortic Valve Replacement, Transfemoral (N/A) ECHOCARDIOGRAM, TRANSTHORACIC (N/A) Subjective: His main complaint is of hiccups and burping.  Ambulated in the room with PT this am but not much. Ambulated 100 ft yesterday afternoon with mobility team and BP remained stable.  Objective: Vital signs in last 24 hours: Temp:  [97.8 F (36.6 C)-98.8 F (37.1 C)] 97.8 F (36.6 C) (07/05 0721) Pulse Rate:  [60-67] 63 (07/05 0721) Cardiac Rhythm: A-V Sequential paced;Bundle branch block (07/04 1938) Resp:  [14-21] 17 (07/05 0721) BP: (94-170)/(75-103) 157/92 (07/05 0721) SpO2:  [94 %-98 %] 98 % (07/05 0721) Weight:  [96.8 kg] 96.8 kg (07/05 0630)  Hemodynamic parameters for last 24 hours:    Intake/Output from previous day: 07/04 0701 - 07/05 0700 In: 240 [P.O.:240] Out: 100 [Urine:100] Intake/Output this shift: No intake/output data recorded.  General appearance: alert and cooperative Neurologic: intact Heart: regular rate and rhythm Lungs: rales right chest Extremities: no edema Wound: groin sites ok  Lab Results: Recent Labs    02/02/24 0306 02/03/24 0305  WBC 10.3 9.0  HGB 14.6 14.8  HCT 45.7 46.5  PLT 138* 157   BMET:  Recent Labs    02/02/24 0306 02/03/24 0305  NA 140 139  K 4.6 4.3  CL 101 102  CO2 30 28  GLUCOSE 79 90  BUN 23 23  CREATININE 1.74* 1.71*  CALCIUM  10.3 10.4*    PT/INR: No results for input(s): LABPROT, INR in the last 72 hours. ABG    Component Value Date/Time   PHART 7.305 (L) 12/21/2023 1044   HCO3 25.0 12/21/2023 1057   TCO2 25 01/30/2024 1554   ACIDBASEDEF 2.0 12/21/2023 1057   O2SAT 69 12/21/2023 1057   CBG (last 3)  Recent Labs    02/02/24 1630 02/02/24 2118 02/03/24 0628  GLUCAP 150* 138* 93    Assessment/Plan: S/P Procedure(s) (LRB): Transcatheter Aortic Valve Replacement, Transfemoral (N/A) ECHOCARDIOGRAM, TRANSTHORACIC (N/A)  POD 4 BP is up higher without  Coreg  and Imdur  but should be fine.  Renal function continues to improve gradually. Creat was 1.4 preop but was 1.95 in May. Likely related to contrast.  Persistent hiccups of unclear etiology. Will check CXR. Burping is probably due to air swallowing with hiccups.  Continue ambulation.   I don't think he can go home at his current level of functioning with no one at home. His landlord lives behind him but is 14 and can only help him a little. It sounds like he was barely making it at home preop.   LOS: 4 days    Dorise MARLA Fellers 02/03/2024

## 2024-02-03 NOTE — Evaluation (Signed)
 Occupational Therapy Evaluation Patient Details Name: Johnny [name] MRN: [id number] DOB: 1946/04/30 Today's Date: 02/03/2024   History of Present Illness   Pt is a 78 y/o male presenting with worsening exertional SOB and fatigue, also having frequent episodes of lightheadedness and dizziness.  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  PMH: SSS s/p BSCI DC PPM (DOI 04/2021), paroxysmal AF, CAD s/p CABG, PCI, moderate AS, HFpEF, severe OSA, CKD3, DM2, HLD and HTN     Clinical Impressions PTA, pt living alone and able to perform BADL with mod I. Pt with limited identified support in home setting. Upon eval, pt presents with generalized weakness, deconditioning, safety, activity tolerance, and balance. Pt needing up to min A for balance when OOB and reports intermittent dizziness. Due to limited support and current need for external assist, Patient will benefit from continued inpatient follow up therapy, <3 hours/day. Pt also endorses various concerns with home management/IADL. Discussed potential transition to ALF after inpatient rehab.      If plan is discharge home, recommend the following:   A little help with walking and/or transfers;A little help with bathing/dressing/bathroom;Assistance with cooking/housework;Assist for transportation;Help with stairs or ramp for entrance     Functional Status Assessment   Patient has had a recent decline in their functional status and demonstrates the ability to make significant improvements in function in a reasonable and predictable amount of time.     Equipment Recommendations   Other (comment) (defer)     Recommendations for Other Services         Precautions/Restrictions   Precautions Precautions: Fall Recall of Precautions/Restrictions: Intact Restrictions Weight Bearing Restrictions Per Provider Order: No     Mobility Bed Mobility               General bed mobility comments: EOB on arrival    Transfers Overall transfer level: Needs  assistance Equipment used: Rolling walker (2 wheels) Transfers: Sit to/from Stand Sit to Stand: Contact guard assist, Min assist           General transfer comment: min A for steadying on first attempt      Balance Overall balance assessment: Needs assistance Sitting-balance support: No upper extremity supported, Single extremity supported, Feet supported Sitting balance-Leahy Scale: Fair       Standing balance-Leahy Scale: Poor Standing balance comment: reliant on AD or external support                           ADL either performed or assessed with clinical judgement   ADL Overall ADL's : Needs assistance/impaired Eating/Feeding: Modified independent;Sitting Eating/Feeding Details (indicate cue type and reason): EOB on arrival Grooming: Standing;Contact guard assist Grooming Details (indicate cue type and reason): reliant on UE support for balan ce Upper Body Bathing: Set up;Sitting   Lower Body Bathing: Moderate assistance;Sit to/from stand   Upper Body Dressing : Set up;Sitting   Lower Body Dressing: Moderate assistance;Sit to/from stand   Toilet Transfer: Ambulation;Rolling walker (2 wheels);Contact guard assist Toilet Transfer Details (indicate cue type and reason): intermittent min A for balance         Functional mobility during ADLs: Contact guard assist;Minimal assistance;Rolling walker (2 wheels)       Vision Baseline Vision/History: 1 Wears glasses Patient Visual Report: No change from baseline       Perception         Praxis         Pertinent Vitals/Pain Pain Assessment Pain Assessment: Faces Faces  Pain Scale: Hurts a little bit Pain Location: general aches Pain Descriptors / Indicators: Aching, Sore Pain Intervention(s): Limited activity within patient's tolerance, Monitored during session     Extremity/Trunk Assessment Upper Extremity Assessment Upper Extremity Assessment: Generalized weakness;RUE deficits/detail;LUE  deficits/detail RUE Deficits / Details: pt reports receiving shots in his R shoulder for pain management LUE Deficits / Details: hx of L shoulder surgery   Lower Extremity Assessment Lower Extremity Assessment: Defer to PT evaluation   Cervical / Trunk Assessment Cervical / Trunk Assessment: Kyphotic   Communication Communication Communication: No apparent difficulties Factors Affecting Communication: Hearing impaired (mildly hard of hearing without his hearing aides donned)   Cognition Arousal: Alert Behavior During Therapy: WFL for tasks assessed/performed Cognition: No apparent impairments             OT - Cognition Comments: WFL for BADL tasks assessed                 Following commands: Intact (internally distracted at times)       Cueing  General Comments   Cueing Techniques: Verbal cues  VSS.   Exercises     Shoulder Instructions      Home Living Family/patient expects to be discharged to:: Private residence Living Arrangements: Alone Available Help at Discharge:  (elderly landlord can help some) Type of Home: House Home Access: Stairs to enter [employer] of Steps: several Entrance Stairs-Rails: Right;Left Home Layout: One level     Bathroom Shower/Tub: Producer, television/film/video: Standard Bathroom Accessibility: Yes   Home Equipment: Rollator (4 wheels);Cane - single point;Shower seat;Grab bars - toilet          Prior Functioning/Environment Prior Level of Function : Independent/Modified Independent             Mobility Comments: Pt reports use of rollator ADLs Comments: pt is predominantly home bound reporting he orders his groceries, and endorses difficulty with bringing them in as well as inability to manage his garbage and home making tasks    OT Problem List: Decreased strength;Decreased activity tolerance;Impaired balance (sitting and/or standing);Cardiopulmonary status limiting activity;Decreased  knowledge of use of DME or AE   OT Treatment/Interventions: Self-care/ADL training;Therapeutic exercise;DME and/or AE instruction;Therapeutic activities;Patient/family education;Balance training      OT Goals(Current goals can be found in the care plan section)   Acute Rehab OT Goals Patient Stated Goal: get better OT Goal Formulation: With patient Time For Goal Achievement: 02/17/24 Potential to Achieve Goals: Good   OT Frequency:  Min 2X/week    Co-evaluation              AM-PAC OT 6 Clicks Daily Activity     Outcome Measure Help from another person eating meals?: None Help from another person taking care of personal grooming?: A Little Help from another person toileting, which includes using toliet, bedpan, or urinal?: A Little Help from another person bathing (including washing, rinsing, drying)?: A Little Help from another person to put on and taking off regular upper body clothing?: A Little Help from another person to put on and taking off regular lower body clothing?: A Lot 6 Click Score: 18   End of Session Equipment Utilized During Treatment: Gait belt;Rolling walker (2 wheels) Nurse Communication: Mobility status  Activity Tolerance: Patient tolerated treatment well Patient left: with call bell/phone within reach;in chair  OT Visit Diagnosis: Unsteadiness on feet (R26.81);Muscle weakness (generalized) (M62.81)  Time: 9187-9157 OT Time Calculation (min): 30 min Charges:  OT General Charges $OT Visit: 1 Visit OT Evaluation $OT Eval Low Complexity: 1 Low OT Treatments $Self Care/Home Management : 8-22 mins  Elma JONETTA Lebron FREDERICK, OTR/L [facility] Acute Rehabilitation Office: [phone]   Elma JONETTA Lebron 02/03/2024, 8:57 AM

## 2024-02-04 LAB — GLUCOSE, CAPILLARY
Glucose-Capillary: 96 mg/dL (ref 70–99)
Glucose-Capillary: 96 mg/dL (ref 70–99)
Glucose-Capillary: 116 mg/dL — ABNORMAL HIGH (ref 70–99)
Glucose-Capillary: 138 mg/dL — ABNORMAL HIGH (ref 70–99)

## 2024-02-04 LAB — BASIC METABOLIC PANEL WITH GFR
Anion gap: 9 (ref 5–15)
BUN: 23 mg/dL (ref 8–23)
CO2: 29 mmol/L (ref 22–32)
Calcium: 10.9 mg/dL — ABNORMAL HIGH (ref 8.9–10.3)
Chloride: 103 mmol/L (ref 98–111)
Creatinine, Ser: 1.73 mg/dL — ABNORMAL HIGH (ref 0.61–1.24)
GFR, Estimated: 40 mL/min — ABNORMAL LOW (ref >60)
Glucose, Bld: 87 mg/dL (ref 70–99)
Potassium: 4.8 mmol/L (ref 3.5–5.1)
Sodium: 141 mmol/L (ref 135–145)

## 2024-02-04 LAB — CBC
HCT: 46.6 % (ref 39.0–52.0)
Hemoglobin: 15.1 g/dL (ref 13.0–17.0)
MCH: 29.2 pg (ref 26.0–34.0)
MCHC: 32.4 g/dL (ref 30.0–36.0)
MCV: 90 fL (ref 80.0–100.0)
Platelets: 162 K/uL (ref 150–400)
RBC: 5.18 MIL/uL (ref 4.22–5.81)
RDW: 17.4 % — ABNORMAL HIGH (ref 11.5–15.5)
WBC: 7.9 K/uL (ref 4.0–10.5)
nRBC: 0 % (ref 0.0–0.2)

## 2024-02-04 MED ORDER — CHLORPROMAZINE HCL 25 MG PO TABS
25.0000 mg | ORAL_TABLET | Freq: Three times a day (TID) | ORAL | Status: DC | PRN
  Administered 2024-02-04 – 2024-02-06 (×2): 25 mg via ORAL
  Filled 2024-02-04 (×3): qty 1

## 2024-02-04 MED ORDER — CARVEDILOL 3.125 MG PO TABS
3.1250 mg | ORAL_TABLET | Freq: Two times a day (BID) | ORAL | Status: DC
  Administered 2024-02-04 – 2024-02-07 (×7): 3.125 mg via ORAL
  Filled 2024-02-04 (×7): qty 1

## 2024-02-04 MED ORDER — ALUM & MAG HYDROXIDE-SIMETH 200-200-20 MG/5ML PO SUSP
30.0000 mL | ORAL | Status: DC | PRN
  Administered 2024-02-04 – 2024-02-07 (×2): 30 mL via ORAL
  Filled 2024-02-04 (×2): qty 30

## 2024-02-04 MED ORDER — ISOSORBIDE MONONITRATE ER 30 MG PO TB24
30.0000 mg | ORAL_TABLET | Freq: Every day | ORAL | Status: DC
  Administered 2024-02-04 – 2024-02-07 (×4): 30 mg via ORAL
  Filled 2024-02-04 (×4): qty 1

## 2024-02-04 NOTE — Progress Notes (Signed)
 5 Days Post-Op Procedure(s) (LRB): Transcatheter Aortic Valve Replacement, Transfemoral (N/A) ECHOCARDIOGRAM, TRANSTHORACIC (N/A) Subjective: Still has some hiccups and burping. Ambulating to bathroom without walker.  Walked 150 ft down hall last night with nurse.  Objective: Vital signs in last 24 hours: Temp:  [98.2 F (36.8 C)-98.5 F (36.9 C)] 98.2 F (36.8 C) (07/06 0321) Pulse Rate:  [60-70] 64 (07/06 0321) Cardiac Rhythm: Ventricular paced (07/05 1900) Resp:  [14-18] 14 (07/06 0321) BP: (157-171)/(98-106) 164/106 (07/06 0321) SpO2:  [93 %-100 %] 96 % (07/06 0321) Weight:  [98.8 kg] 98.8 kg (07/06 0508)  Hemodynamic parameters for last 24 hours:    Intake/Output from previous day: 07/05 0701 - 07/06 0700 In: 120 [P.O.:120] Out: -  Intake/Output this shift: No intake/output data recorded.  General appearance: alert and cooperative Neurologic: intact Heart: regular rate and rhythm Lungs: clear to auscultation bilaterally Extremities: no edema Wound: groin sites ok  Lab Results: Recent Labs    02/03/24 0305 02/04/24 0357  WBC 9.0 7.9  HGB 14.8 15.1  HCT 46.5 46.6  PLT 157 162   BMET:  Recent Labs    02/03/24 0305 02/04/24 0357  NA 139 141  K 4.3 4.8  CL 102 103  CO2 28 29  GLUCOSE 90 87  BUN 23 23  CREATININE 1.71* 1.73*  CALCIUM  10.4* 10.9*    PT/INR: No results for input(s): LABPROT, INR in the last 72 hours. ABG    Component Value Date/Time   PHART 7.305 (L) 12/21/2023 1044   HCO3 25.0 12/21/2023 1057   TCO2 25 01/30/2024 1554   ACIDBASEDEF 2.0 12/21/2023 1057   O2SAT 69 12/21/2023 1057   CBG (last 3)  Recent Labs    02/03/24 1624 02/03/24 2057 02/04/24 0610  GLUCAP 79 97 96   CXR yesterday clear.  Assessment/Plan: S/P Procedure(s) (LRB): Transcatheter Aortic Valve Replacement, Transfemoral (N/A) ECHOCARDIOGRAM, TRANSTHORACIC (N/A)  POD 5 Hypertensive. Will resume his Coreg  at 3.25 bid and Imdur  30.   Continue  ambulation today  I think he should be able to go home tomorrow if he continues to progress.   LOS: 5 days    Dorise MARLA Fellers 02/04/2024

## 2024-02-05 LAB — GLUCOSE, CAPILLARY
Glucose-Capillary: 127 mg/dL — ABNORMAL HIGH (ref 70–99)
Glucose-Capillary: 131 mg/dL — ABNORMAL HIGH (ref 70–99)
Glucose-Capillary: 196 mg/dL — ABNORMAL HIGH (ref 70–99)
Glucose-Capillary: 74 mg/dL (ref 70–99)

## 2024-02-05 MED ORDER — METOCLOPRAMIDE HCL 5 MG PO TABS
10.0000 mg | ORAL_TABLET | Freq: Four times a day (QID) | ORAL | Status: DC | PRN
  Administered 2024-02-06 (×2): 10 mg via ORAL
  Filled 2024-02-05 (×2): qty 2

## 2024-02-05 MED ORDER — LACTULOSE 10 GM/15ML PO SOLN
10.0000 g | Freq: Every day | ORAL | Status: DC | PRN
  Administered 2024-02-05 – 2024-02-07 (×3): 10 g via ORAL
  Filled 2024-02-05 (×3): qty 15

## 2024-02-05 NOTE — TOC Initial Note (Signed)
 Transition of Care [facility]) - Initial/Assessment Note    Patient Details  Name: Johnny [name] MRN: [id number] Date of Birth: 21-Aug-1945  Transition of Care [facility]) CM/SW Contact:    Isaiah Public, LCSWA Phone Number: 02/05/2024, 4:58 PM  Clinical Narrative:                  CSW received consult for possible SNF placement at time of discharge. CSW spoke with patient regarding PT recommendation of SNF placement at time of discharge. Patient reports PTA he comes from home alone. Patient expressed understanding of PT recommendation and is agreeable to SNF placement at time of discharge. Patient gave CSW permission to fax out initial referral for possible SNF placement. CSW discussed insurance authorization process.  No further questions reported at this time. CSW to continue to follow and assist with discharge planning needs.    Expected Discharge Plan: Skilled Nursing Facility Barriers to Discharge: Continued Medical Work up   Patient Goals and CMS Choice Patient states their goals for this hospitalization and ongoing recovery are:: SNF   Choice offered to / list presented to : Patient      Expected Discharge Plan and Services In-house Referral: Clinical Social Work     Living arrangements for the past 2 months: Single Family Home                                      Prior Living Arrangements/Services Living arrangements for the past 2 months: Single Family Home Lives with:: Self Patient language and need for interpreter reviewed:: Yes Do you feel safe going back to the place where you live?: No   SNF  Need for Family Participation in Patient Care: Yes (Comment) Care giver support system in place?: Yes (comment)   Criminal Activity/Legal Involvement Pertinent to Current Situation/Hospitalization: No - Comment as needed  Activities of Daily Living   ADL Screening (condition at time of admission) Independently performs ADLs?: Yes (appropriate for developmental age) Is  the patient deaf or have difficulty hearing?: No Does the patient have difficulty seeing, even when wearing glasses/contacts?: No Does the patient have difficulty concentrating, remembering, or making decisions?: No  Permission Sought/Granted Permission sought to share information with : Case Manager, Magazine features editor, Family Supports Permission granted to share information with : Yes, Verbal Permission Granted     Permission granted to share info w AGENCY: SNF        Emotional Assessment   Attitude/Demeanor/Rapport: Gracious Affect (typically observed): Calm Orientation: : Oriented to Self, Oriented to Place, Oriented to  Time, Oriented to Situation Alcohol / Substance Use: Not Applicable Psych Involvement: No (comment)  Admission diagnosis:  Severe aortic stenosis [I35.0] S/P TAVR (transcatheter aortic valve replacement) [Z95.2] Patient Active Problem List   Diagnosis Date Noted   S/P TAVR (transcatheter aortic valve replacement) 01/30/2024   Primary osteoarthritis of right knee 12/26/2023   Primary osteoarthritis, 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/26/2023   Acquired thrombophilia (HCC) 10/10/2022   Atherosclerotic heart disease of native coronary artery with other forms of angina pectoris (HCC) 10/10/2022   Chronic heart failure with preserved ejection fraction (HFpEF) (HCC) 06/19/2022   AKI (acute kidney injury) (HCC) 12/14/2021   Essential hypertension 12/14/2021   Severe aortic stenosis 12/14/2021   Chronic respiratory failure with hypoxia (HCC) 12/14/2021   PAD (peripheral artery disease) (HCC) 12/14/2021   Stage 3b chronic kidney disease (HCC) 10/20/2021   PAF (paroxysmal atrial fibrillation) (  HCC) 04/12/2021   OSA (obstructive sleep apnea) 09/15/2020   Mixed hyperlipidemia 09/06/2020   Anxiety with depression 09/06/2020   Coronary artery disease involving native coronary artery of native heart 09/06/2020   PCP:  Phyllis Jereld BROCKS, NP Pharmacy:   [facility]  [phone] [address], [place] - [address] [place] [address] Phone: [phone] Fax: [phone]  [facility] - [place], [place] - [address] [place] [address] Phone: [phone] Fax: [phone]     Social Drivers of Health (SDOH) Social History: SDOH Screenings   Food Insecurity: Food Insecurity Present (01/31/2024)  Housing: Low Risk  (01/31/2024)  Transportation Needs: Unmet Transportation Needs (01/31/2024)  Utilities: Not At Risk (01/31/2024)  Alcohol Screen: Low Risk  (07/02/2023)  Depression (PHQ2-9): High Risk (11/30/2023)  Financial Resource Strain: High Risk (07/02/2023)  Physical Activity: Unknown (07/02/2023)  Social Connections: Moderately Isolated (01/31/2024)  Stress: Stress Concern Present (07/02/2023)  Tobacco Use: Medium Risk (01/30/2024)   SDOH Interventions:     Readmission Risk Interventions     No data to display

## 2024-02-05 NOTE — Progress Notes (Signed)
 6 Days Post-Op Procedure(s) (LRB): Transcatheter Aortic Valve Replacement, Transfemoral (N/A) ECHOCARDIOGRAM, TRANSTHORACIC (N/A) Subjective:  Says he only walked a little yesterday.   Objective: Vital signs in last 24 hours: Temp:  [97.6 F (36.4 C)-98.5 F (36.9 C)] 97.6 F (36.4 C) (07/07 0321) Pulse Rate:  [60-68] 64 (07/07 0321) Cardiac Rhythm: A-V Sequential paced (07/06 2130) Resp:  [13-18] 15 (07/07 0604) BP: (113-158)/(84-103) 131/91 (07/07 0321) SpO2:  [92 %-98 %] 95 % (07/07 0321) Weight:  [98.1 kg] 98.1 kg (07/07 0604)  Hemodynamic parameters for last 24 hours:    Intake/Output from previous day: 07/06 0701 - 07/07 0700 In: 720 [P.O.:720] Out: -  Intake/Output this shift: No intake/output data recorded.  General appearance: alert and cooperative Neurologic: intact Heart: regular rate and rhythm Lungs: clear to auscultation bilaterally Extremities: no edema  Lab Results: Recent Labs    02/03/24 0305 02/04/24 0357  WBC 9.0 7.9  HGB 14.8 15.1  HCT 46.5 46.6  PLT 157 162   BMET:  Recent Labs    02/03/24 0305 02/04/24 0357  NA 139 141  K 4.3 4.8  CL 102 103  CO2 28 29  GLUCOSE 90 87  BUN 23 23  CREATININE 1.71* 1.73*  CALCIUM  10.4* 10.9*    PT/INR: No results for input(s): LABPROT, INR in the last 72 hours. ABG    Component Value Date/Time   PHART 7.305 (L) 12/21/2023 1044   HCO3 25.0 12/21/2023 1057   TCO2 25 01/30/2024 1554   ACIDBASEDEF 2.0 12/21/2023 1057   O2SAT 69 12/21/2023 1057   CBG (last 3)  Recent Labs    02/04/24 1739 02/04/24 2103 02/05/24 0533  GLUCAP 116* 138* 127*    Assessment/Plan: S/P Procedure(s) (LRB): Transcatheter Aortic Valve Replacement, Transfemoral (N/A) ECHOCARDIOGRAM, TRANSTHORACIC (N/A)  POD 6 Hemodynamics stable after resuming low dose Coreg  and Imdur  yesterday.  He needs to ambulate this am and if doing ok I think he can go home if his landlord can help him some.    LOS: 6 days     Dorise MARLA Fellers 02/05/2024

## 2024-02-05 NOTE — Progress Notes (Signed)
 Physical Therapy Treatment Patient Details Name: Johnny [name] MRN: [id number] DOB: 1945-09-09 Today's Date: 02/05/2024   History of Present Illness Pt is a 78 y/o male presenting with worsening exertional SOB and fatigue, also having frequent episodes of lightheadedness and dizziness.  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  PMH: SSS s/p BSCI DC PPM (DOI 04/2021), paroxysmal AF, CAD s/p CABG, PCI, moderate AS, HFpEF, severe OSA, CKD3, DM2, HLD and HTN    PT Comments  Pt with limited progress today due to lethargy and fogginess, in part due to lack of sleep for at least 2 days.  Emphasis on transitions, scooting, sitting balance, safe sit to stands, balance with cane, gait training with the cane, with change to the RW.  Further emphasis on gait stability, stamina with the RW while be monitored.    If plan is discharge home, recommend the following: A little help with walking and/or transfers;A little help with bathing/dressing/bathroom;Assistance with cooking/housework;Assist for transportation;Help with stairs or ramp for entrance   Can travel by private vehicle     No  Equipment Recommendations  None recommended by PT    Recommendations for Other Services       Precautions / Restrictions Precautions Precautions: Fall Recall of Precautions/Restrictions: Intact     Mobility  Bed Mobility Overal bed mobility: Needs Assistance Bed Mobility: Sidelying to Sit, Rolling, Sit to Supine Rolling: Contact guard assist, Used rails Sidelying to sit: Min assist, HOB elevated, Used rails   Sit to supine: Contact guard assist   General bed mobility comments: pt was lethargic and somewhat foggy when transitioning out then into bed.    Transfers Overall transfer level: Needs assistance Equipment used: Rolling walker (2 wheels), Straight cane Transfers: Sit to/from Stand Sit to Stand: Min assist           General transfer comment: min stability assist with use of cane.  Once up pt was noticeably more  unsteady.\    Ambulation/Gait Ambulation/Gait assistance: Min assist Gait Distance (Feet): 125 Feet Assistive device: Straight cane, Rolling walker (2 wheels) Gait Pattern/deviations: Step-through pattern   Gait velocity interpretation: <1.8 ft/sec, indicate of risk for recurrent falls   General Gait Details: With cane first 15 feet, wide BOS, stagger R and general drift R.  Some improvement with the RW, but still was not safe enough to maintain CGA level.   Stairs             Wheelchair Mobility     Tilt Bed    Modified Rankin (Stroke Patients Only)       Balance Overall balance assessment: Needs assistance Sitting-balance support: No upper extremity supported, Single extremity supported, Feet supported Sitting balance-Leahy Scale: Fair Sitting balance - Comments: able to sit EOB without assist, but in general today, looks and acts dazed, rocking R to the point of holding on to the rail at times.   Standing balance support: Single extremity supported, No upper extremity supported, During functional activity Standing balance-Leahy Scale: Poor Standing balance comment: reliant on AD or external support                            Communication Communication Communication: No apparent difficulties Factors Affecting Communication: Hearing impaired  Cognition Arousal: Alert, Lethargic Behavior During Therapy: WFL for tasks assessed/performed   PT - Cognitive impairments: No family/caregiver present to determine baseline  Following commands: Intact      Cueing Cueing Techniques: Verbal cues  Exercises      General Comments        Pertinent Vitals/Pain Pain Assessment Pain Assessment: Faces Faces Pain Scale: Hurts a little bit Pain Location: abdominals due to hiccups Pain Descriptors / Indicators: Aching Pain Intervention(s): Monitored during session    Home Living                          Prior  Function            PT Goals (current goals can now be found in the care plan section) Acute Rehab PT Goals Patient Stated Goal: Indepedent at home. PT Goal Formulation: Patient unable to participate in goal setting Time For Goal Achievement: 02/09/24 Potential to Achieve Goals: Good Progress towards PT goals: Progressing toward goals    Frequency    Min 3X/week      PT Plan      Co-evaluation              AM-PAC PT 6 Clicks Mobility   Outcome Measure  Help needed turning from your back to your side while in a flat bed without using bedrails?: A Little Help needed moving from lying on your back to sitting on the side of a flat bed without using bedrails?: A Little Help needed moving to and from a bed to a chair (including a wheelchair)?: A Little Help needed standing up from a chair using your arms (e.g., wheelchair or bedside chair)?: A Little Help needed to walk in hospital room?: A Little Help needed climbing 3-5 steps with a railing? : A Little 6 Click Score: 18    End of Session Equipment Utilized During Treatment:  (pt weaning off oxygen  today.   Sp02 low 90's on RA at rest, sensor would not read sats during mobility.  Resonse appeared good, but pt showed to be more unsteady and foggy during gait.) Activity Tolerance: Patient limited by fatigue Patient left: in bed;with call bell/phone within reach Nurse Communication: Mobility status PT Visit Diagnosis: Unsteadiness on feet (R26.81);Other abnormalities of gait and mobility (R26.89);Difficulty in walking, not elsewhere classified (R26.2)     Time: 8648-8586 PT Time Calculation (min) (ACUTE ONLY): 22 min  Charges:    $Gait Training: 8-22 mins PT General Charges $$ ACUTE PT VISIT: 1 Visit                      02/05/2024  Johnny HERO., PT Acute Rehabilitation Services [phone]  (office)   Johnny [name] 02/05/2024, 2:29 PM

## 2024-02-05 NOTE — Plan of Care (Signed)
 ?  Problem: Clinical Measurements: ?Goal: Will remain free from infection ?Outcome: Progressing ?  ?

## 2024-02-05 NOTE — NC FL2 (Signed)
 [place]  MEDICAID FL2 LEVEL OF CARE FORM     IDENTIFICATION  Patient Name: Johnny [name] Birthdate: 1946-07-25 Sex: male Admission Date (Current Location): 01/30/2024  [facility] and [place] Number:  Producer, television/film/video and Address:  [facility], [address], [place], [place] [address]      Provider Number: [id number]  Attending Physician Name and Address:  Lucas Dorise POUR, MD  Relative Name and Phone Number:  Barnie Mews (sister) [phone]    Current Level of Care: Hospital Recommended Level of Care: Skilled Nursing Facility Prior Approval Number:    Date Approved/Denied:   PASRR Number:    Discharge Plan: SNF    Current Diagnoses: Patient Active Problem List   Diagnosis Date Noted   S/P TAVR (transcatheter aortic valve replacement) 01/30/2024   Primary osteoarthritis of right knee 12/26/2023   Primary osteoarthritis, 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/26/2023   Acquired thrombophilia (HCC) 10/10/2022   Atherosclerotic heart disease of native coronary artery with other forms of angina pectoris (HCC) 10/10/2022   Chronic heart failure with preserved ejection fraction (HFpEF) (HCC) 06/19/2022   AKI (acute kidney injury) (HCC) 12/14/2021   Essential hypertension 12/14/2021   Severe aortic stenosis 12/14/2021   Chronic respiratory failure with hypoxia (HCC) 12/14/2021   PAD (peripheral artery disease) (HCC) 12/14/2021   Stage 3b chronic kidney disease (HCC) 10/20/2021   PAF (paroxysmal atrial fibrillation) (HCC) 04/12/2021   OSA (obstructive sleep apnea) 09/15/2020   Mixed hyperlipidemia 09/06/2020   Anxiety with depression 09/06/2020   Coronary artery disease involving native coronary artery of native heart 09/06/2020    Orientation RESPIRATION BLADDER Height & Weight     Self, Time, Situation, Place  Normal Continent Weight: 216 lb 4.3 oz (98.1 kg) Height:  5' 6 (167.6 cm)  BEHAVIORAL SYMPTOMS/MOOD NEUROLOGICAL BOWEL NUTRITION STATUS       Continent Diet (Please see discharge summary)  AMBULATORY STATUS COMMUNICATION OF NEEDS Skin   Limited Assist Verbally Other (Comment) (WDL,Ecchymosis,Groin,L,Wound/Incision LDAs)                       Personal Care Assistance Level of Assistance  Bathing, Dressing, Feeding Bathing Assistance: Limited assistance Feeding assistance: Independent Dressing Assistance: Limited assistance     Functional Limitations Info  Sight, Hearing, Speech Sight Info: Adequate Hearing Info: Impaired Speech Info: Adequate    SPECIAL CARE FACTORS FREQUENCY  PT (By licensed PT), OT (By licensed OT)     PT Frequency: 5x min weekly OT Frequency: 5x min weekly            Contractures Contractures Info: Not present    Additional Factors Info  Code Status, Allergies, Insulin  Sliding Scale, Psychotropic Code Status Info: FULL Allergies Info: Metformin And Related Psychotropic Info: mirtazapine  (REMERON  SOL-TAB) disintegrating tablet 30 mg daily at bedtime,venlafaxine  XR (EFFEXOR -XR) 24 hr capsule 150 mg daily with breakfast Insulin  Sliding Scale Info: insulin  aspart (novoLOG ) injection 0-24 Units 3 times daily before meals and bedtime       Current Medications (02/05/2024):  This is the current hospital active medication list Current Facility-Administered Medications  Medication Dose Route Frequency Provider Last Rate Last Admin   0.9 %  sodium chloride  infusion  250 mL Intravenous PRN Thompson, Kathryn R, PA-C       acetaminophen  (TYLENOL ) tablet 650 mg  650 mg Oral Q6H PRN Thompson, Kathryn R, PA-C       Or   acetaminophen  (TYLENOL ) suppository 650 mg  650 mg Rectal Q6H  PRN Thompson, Kathryn R, PA-C       alum & mag hydroxide-simeth (MAALOX/MYLANTA) 200-200-20 MG/5ML suspension 30 mL  30 mL Oral Q4H PRN Gold, Wayne E, PA-C   30 mL at 02/04/24 2136   amiodarone  (PACERONE ) tablet 200 mg  200 mg Oral Daily Thompson, Kathryn R, PA-C   200 mg at 02/05/24 9082   apixaban  (ELIQUIS ) tablet 5 mg  5  mg Oral BID Thompson, Kathryn R, PA-C   5 mg at 02/05/24 9082   carvedilol  (COREG ) tablet 3.125 mg  3.125 mg Oral BID WC Bartle, Bryan K, MD   3.125 mg at 02/05/24 9081   chlorproMAZINE  (THORAZINE ) tablet 25 mg  25 mg Oral TID PRN Gold, Wayne E, PA-C   25 mg at 02/04/24 2141   famotidine  (PEPCID ) tablet 40 mg  40 mg Oral Daily Thompson, Kathryn R, PA-C   40 mg at 02/05/24 9081   insulin  aspart (novoLOG ) injection 0-24 Units  0-24 Units Subcutaneous TID AC & HS Thompson, Kathryn R, PA-C   2 Units at 02/05/24 1301   isosorbide  mononitrate (IMDUR ) 24 hr tablet 30 mg  30 mg Oral Daily Bartle, Bryan K, MD   30 mg at 02/05/24 9081   lactulose  (CHRONULAC ) 10 GM/15ML solution 10 g  10 g Oral Daily PRN Thompson, Kathryn R, PA-C       loratadine  (CLARITIN ) tablet 10 mg  10 mg Oral Daily Thompson, Kathryn R, PA-C   10 mg at 02/05/24 9082   metoCLOPramide  (REGLAN ) tablet 10 mg  10 mg Oral Q6H PRN Sebastian Lamarr SAUNDERS, PA-C       mirtazapine  (REMERON  SOL-TAB) disintegrating tablet 30 mg  30 mg Oral QHS Thompson, Kathryn R, PA-C   30 mg at 02/04/24 2136   ondansetron  (ZOFRAN ) injection 4 mg  4 mg Intravenous Q6H PRN Sebastian Lamarr SAUNDERS, PA-C   4 mg at 02/02/24 9161   Oral care mouth rinse  15 mL Mouth Rinse PRN Bartle, Bryan K, MD       oxyCODONE  (Oxy IR/ROXICODONE ) immediate release tablet 5-10 mg  5-10 mg Oral Q3H PRN Sebastian Lamarr SAUNDERS, PA-C       pantoprazole  (PROTONIX ) EC tablet 40 mg  40 mg Oral QAC breakfast Sebastian Lamarr SAUNDERS, PA-C   40 mg at 02/05/24 9082   ranolazine  (RANEXA ) 12 hr tablet 500 mg  500 mg Oral BID Thompson, Kathryn R, PA-C   500 mg at 02/05/24 9082   rosuvastatin  (CRESTOR ) tablet 40 mg  40 mg Oral Daily Thompson, Kathryn R, PA-C   40 mg at 02/05/24 9081   sodium chloride  flush (NS) 0.9 % injection 3 mL  3 mL Intravenous Q12H Sebastian Lamarr SAUNDERS, PA-C   3 mL at 02/05/24 9080   sodium chloride  flush (NS) 0.9 % injection 3 mL  3 mL Intravenous PRN Thompson, Kathryn R, PA-C       traMADol   (ULTRAM ) tablet 50-100 mg  50-100 mg Oral Q4H PRN Thompson, Kathryn R, PA-C   100 mg at 02/03/24 2008   venlafaxine  XR (EFFEXOR -XR) 24 hr capsule 150 mg  150 mg Oral Q breakfast Thompson, Kathryn R, PA-C   150 mg at 02/05/24 9082     Discharge Medications: Please see discharge summary for a list of discharge medications.  Relevant Imaging Results:  Relevant Lab Results:   Additional Information [id number]  Isaiah Public, LCSWA

## 2024-02-05 NOTE — Progress Notes (Signed)
  [facility]   MULTIDISCIPLINARY HEART VALVE TEAM  Pt having persistent hiccups that are keeping him up at night as well as constipation. Will give Reglan  to see if that helps with the hiccups and some lactulose  to help with bowels.   Johnny Hummer PA-C  [facility]

## 2024-02-06 DIAGNOSIS — G4733 Obstructive sleep apnea (adult) (pediatric): Secondary | ICD-10-CM

## 2024-02-06 DIAGNOSIS — I495 Sick sinus syndrome: Secondary | ICD-10-CM

## 2024-02-06 DIAGNOSIS — E1159 Type 2 diabetes mellitus with other circulatory complications: Secondary | ICD-10-CM

## 2024-02-06 DIAGNOSIS — Z950 Presence of cardiac pacemaker: Secondary | ICD-10-CM

## 2024-02-06 DIAGNOSIS — N179 Acute kidney failure, unspecified: Secondary | ICD-10-CM

## 2024-02-06 DIAGNOSIS — E782 Mixed hyperlipidemia: Secondary | ICD-10-CM

## 2024-02-06 DIAGNOSIS — E1122 Type 2 diabetes mellitus with diabetic chronic kidney disease: Secondary | ICD-10-CM

## 2024-02-06 DIAGNOSIS — Z955 Presence of coronary angioplasty implant and graft: Secondary | ICD-10-CM

## 2024-02-06 DIAGNOSIS — I480 Paroxysmal atrial fibrillation: Secondary | ICD-10-CM

## 2024-02-06 DIAGNOSIS — E785 Hyperlipidemia, unspecified: Secondary | ICD-10-CM

## 2024-02-06 DIAGNOSIS — Z6837 Body mass index (BMI) 37.0-37.9, adult: Secondary | ICD-10-CM

## 2024-02-06 DIAGNOSIS — I5032 Chronic diastolic (congestive) heart failure: Secondary | ICD-10-CM

## 2024-02-06 DIAGNOSIS — I2510 Atherosclerotic heart disease of native coronary artery without angina pectoris: Secondary | ICD-10-CM

## 2024-02-06 DIAGNOSIS — Z951 Presence of aortocoronary bypass graft: Secondary | ICD-10-CM

## 2024-02-06 DIAGNOSIS — J9611 Chronic respiratory failure with hypoxia: Secondary | ICD-10-CM

## 2024-02-06 DIAGNOSIS — I7121 Aneurysm of the ascending aorta, without rupture: Secondary | ICD-10-CM

## 2024-02-06 DIAGNOSIS — I739 Peripheral vascular disease, unspecified: Secondary | ICD-10-CM

## 2024-02-06 DIAGNOSIS — I350 Nonrheumatic aortic (valve) stenosis: Secondary | ICD-10-CM

## 2024-02-06 LAB — GLUCOSE, CAPILLARY
Glucose-Capillary: 89 mg/dL (ref 70–99)
Glucose-Capillary: 110 mg/dL — ABNORMAL HIGH (ref 70–99)
Glucose-Capillary: 147 mg/dL — ABNORMAL HIGH (ref 70–99)
Glucose-Capillary: 122 mg/dL — ABNORMAL HIGH (ref 70–99)

## 2024-02-06 LAB — BASIC METABOLIC PANEL WITH GFR
Anion gap: 9 (ref 5–15)
BUN: 26 mg/dL — ABNORMAL HIGH (ref 8–23)
CO2: 26 mmol/L (ref 22–32)
Calcium: 10.4 mg/dL — ABNORMAL HIGH (ref 8.9–10.3)
Chloride: 103 mmol/L (ref 98–111)
Creatinine, Ser: 1.8 mg/dL — ABNORMAL HIGH (ref 0.61–1.24)
GFR, Estimated: 38 mL/min — ABNORMAL LOW (ref >60)
Glucose, Bld: 164 mg/dL — ABNORMAL HIGH (ref 70–99)
Potassium: 3.8 mmol/L (ref 3.5–5.1)
Sodium: 138 mmol/L (ref 135–145)

## 2024-02-06 NOTE — Plan of Care (Signed)
  Problem: Education: Goal: Knowledge of General Education information will improve Description: Including pain rating scale, medication(s)/side effects and non-pharmacologic comfort measures Outcome: Progressing   Problem: Clinical Measurements: Goal: Will remain free from infection Outcome: Progressing Goal: Respiratory complications will improve Outcome: Progressing   Problem: Activity: Goal: Risk for activity intolerance will decrease Outcome: Progressing   Problem: Elimination: Goal: Will not experience complications related to urinary retention Outcome: Progressing   Problem: Elimination: Goal: Will not experience complications related to bowel motility Outcome: Not Progressing

## 2024-02-06 NOTE — TOC Progression Note (Addendum)
 Transition of Care [facility]) - Progression Note    Patient Details  Name: Delvecchio Madole MRN: [id number] Date of Birth: [birth year]  [facility] CM/SW Contact  [name], [place] Phone Number: 02/06/2024, 9:26 AM  Clinical Narrative:     Per PA patient is stable for d/c - patient needs PASRR approval- will send clinicals information once signed by MD, insurance auth and SNF bed choice   TOC will continue to follow and assist with discharge planning.   [name], MSW, [name] Clinical Social Worker    Expected Discharge Plan: Skilled Nursing Facility Barriers to Discharge: Continued Medical Work up  Expected Discharge Plan and Services In-house Referral: Clinical Social Work     Living arrangements for the past 2 months: Single Family Home                                       Social Determinants of Health (SDOH) Interventions SDOH Screenings   Food Insecurity: Food Insecurity Present (01/31/2024)  Housing: Low Risk  (01/31/2024)  Transportation Needs: Unmet Transportation Needs (01/31/2024)  Utilities: Not At Risk (01/31/2024)  Alcohol Screen: Low Risk  (07/02/2023)  Depression (PHQ2-9): High Risk (11/30/2023)  Financial Resource Strain: High Risk (07/02/2023)  Physical Activity: Unknown (07/02/2023)  Social Connections: Moderately Isolated (01/31/2024)  Stress: Stress Concern Present (07/02/2023)  Tobacco Use: Medium Risk (01/30/2024)    Readmission Risk Interventions     No data to display

## 2024-02-06 NOTE — Progress Notes (Addendum)
 [facility]ART AND VASCULAR CENTER   MULTIDISCIPLINARY HEART VALVE TEAM  Patient Name: Johnny [name] Date of Encounter: 02/06/2024  Admit date: 01/30/2024  Primary Care Provider: Phyllis Jereld BROCKS, NP [facility] Cardiologist: Lonni LITTIE Nanas, MD  [facility] Electrophysiologist:  None   Hospital Problem List     Principal Problem:   S/P TAVR (transcatheter aortic valve replacement) Active Problems:   Stage 3b chronic kidney disease (HCC)   Mixed hyperlipidemia   PAF (paroxysmal atrial fibrillation) (HCC)   OSA (obstructive sleep apnea)   Coronary artery disease involving native coronary artery of native heart   Essential hypertension   Severe aortic stenosis   Chronic respiratory failure with hypoxia (HCC)   PAD (peripheral artery disease) (HCC)   Chronic heart failure with preserved ejection fraction (HFpEF) (HCC)     Subjective   Extremely lethargic and difficult to arouse. He was able to follow commands and answer questions appropriately but quickly falls back asleep. I was able to ask him if he wanted to go to a SNF and he said yes.   Inpatient Medications    Scheduled Meds:  amiodarone   200 mg Oral Daily   apixaban   5 mg Oral BID   carvedilol   3.125 mg Oral BID WC   famotidine   40 mg Oral Daily   insulin  aspart  0-24 Units Subcutaneous TID AC & HS   isosorbide  mononitrate  30 mg Oral Daily   loratadine   10 mg Oral Daily   mirtazapine   30 mg Oral QHS   pantoprazole   40 mg Oral QAC breakfast   ranolazine   500 mg Oral BID   rosuvastatin   40 mg Oral Daily   sodium chloride  flush  3 mL Intravenous Q12H   venlafaxine  XR  150 mg Oral Q breakfast   Continuous Infusions:  sodium chloride      PRN Meds: sodium chloride , acetaminophen  **OR** acetaminophen , alum & mag hydroxide-simeth, chlorproMAZINE , lactulose , metoCLOPramide , ondansetron  (ZOFRAN ) IV, mouth rinse, oxyCODONE , sodium chloride  flush, traMADol    Vital Signs    Vitals:   02/06/24 0530 02/06/24  0724 02/06/24 0941 02/06/24 0942  BP: 118/82 123/85 125/85 125/85  Pulse:  67  63  Resp: 20 19 14    Temp: (!) 97.5 F (36.4 C) 97.6 F (36.4 C)    TempSrc: Oral Oral    SpO2: 96% 99%    Weight:      Height:       No intake or output data in the 24 hours ending 02/06/24 1051 Filed Weights   02/03/24 0630 02/04/24 0508 02/05/24 0604  Weight: 96.8 kg 98.8 kg 98.1 kg    Physical Exam    GEN: Well nourished, well developed, in no acute distress. Lethargic, obese HEENT: Grossly normal.  Neck: Supple, no JVD, carotid bruits, or masses. Cardiac: RRR, no murmurs, rubs, or gallops. No clubbing, cyanosis, edema.   Respiratory:  Respirations regular and unlabored, clear to auscultation bilaterally. GI: Soft, nontender, nondistended, BS + x 4. MS: no deformity or atrophy. Skin: warm and dry, no rash. Neuro:  Strength and sensation are intact. Psych: AAOx3.  Normal affect.  Labs    CBC Recent Labs    02/04/24 0357  WBC 7.9  HGB 15.1  HCT 46.6  MCV 90.0  PLT 162   Basic Metabolic Panel Recent Labs    92/93/74 0357 02/06/24 0832  NA 141 138  K 4.8 3.8  CL 103 103  CO2 29 26  GLUCOSE 87 164*  BUN 23 26*  CREATININE  1.73* 1.80*  CALCIUM  10.9* 10.4*    Telemetry    paced - Personally Reviewed   Patient Profile     Sircharles Holzheimer is a 78 y.o. male with a history of HFpEF, HTN, HLD, morbid obesity (BMI 37), DMT2, CKD stage IIIb, PAF on Eliquis , SSS s/p PPM, CAD s/p PCI to RCA and LCx & CABG (2015), and severe AS who presented to [facility] on 01/30/24 for planned TAVR.   Assessment & Plan    Severe AS:  -- S/p successful TAVR with a 29 mm Medtronic FX + THV via the TF approach on 01/30/24.  -- Post operative echo showed EF 60%, mod concentric LVH, normally functioning TAVR with a mean gradient of 10 mmHg and no PVL as well as mild MR. -- Groin sites are stable.  -- Resumed on home Eliquis  5mg  BID.  -- Hospital course prolonged by poor baseline functional status with poor  mobility, hiccups, orthostatic hypotension and constipation. -- Plan DC to SNF   Acute HFpEF: -- LVEDP ~24 mm hg at the time of TAVR. -- He complained of worsening SOB. -- He was diuresed with IV Lasix  40mg  x2. -- Hold off on resuming home Lasix  given worsening renal function.    AKI in the setting of CKD stage IIIb: -- Creat baseline 1.39-1.64. -- Increased to 1.79 after IV diuresis and contrast exposure. All diruetics discontinued and creat continues to climb, up to 1.8 today.    CAD: -- L/RHC showed patent left circumflex and right coronary stents. The vein graft to the obtuse marginal 1 and 2 was known to be occluded. The LIMA to the LAD is known to be a atretic and was not imaged. There was competitive flow seen in the distal LAD. The small LIMA to the LAD appears patent. There was an occluded saphenous vein graft to the obtuse marginal branches. -- Continue medical therapy.    PAF: -- Resumed on home Eliquis  5mg  BID.  -- Continue Coreg  6.25mg  BID.  -- Continue amiodarone  200 mg daily.    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 Recommend annual imaging followup by CTA or MRA. -- This will be dicussed in the outpatient setting.    SSS s/p pacemaker: -- Has been seen by Dr. Francyne in the past but lost to follow up. -- Device interrogated in the hospital and will get him re-established in the outpatient setting.   Hiccups: -- Trialed Reglan  with no help. -- Given thorazine  by TCTS PA which I think has been too sedating. Will stop now  Constipation: -- Lactulose  PRN  Dispo: awaiting SNF placement. Continue to work with PT/OT  Signed, Lamarr Hummer, PA-C  02/06/2024, 10:51 AM  Pager [phone]   Chart reviewed, patient examined, agree with above.  He is very sleepy today, likely related to Thorazine  that he received overnight for hiccups. He is on Remeron  and Effexor  and is not going to tolerate Thorazine . It was stopped.  Working on SNF  placement.

## 2024-02-06 NOTE — Progress Notes (Signed)
                                                                                                                          Re:  Johnny [name]  Date of Birth: [birth year] Date: 02/06/2024  To Whom It May Concern:  Please be advised that the above-named patient will require a short-term nursing home stay-anticipated 30 days or less for rehabilitation and strengthening. The plan is to return home.

## 2024-02-06 NOTE — Progress Notes (Signed)
 Mobility Specialist Progress Note:    02/06/24 9077  Mobility  Activity Ambulated with assistance to bathroom;Transferred from bed to chair;Ambulated with assistance in room;Ambulated with assistance in hallway  Level of Assistance Contact guard assist, steadying assist  Assistive Device Front wheel walker  Distance Ambulated (ft) 150 ft  Activity Response Tolerated well  Mobility Referral Yes  Mobility visit 1 Mobility  Mobility Specialist Start Time (ACUTE ONLY) [id number]  Mobility Specialist Stop Time (ACUTE ONLY) [id number]  Mobility Specialist Time Calculation (min) (ACUTE ONLY) 16 min   Pt received in bed asleep. Agreeable to mobility session. Pt slightly confused and mumbling throughout session. A few times, pt had lateral lean while ambulating with RW, corrected with CGA. Pt denied dizziness. Pt agreeable to sit up in chair, all needs met, RN in room. BP 129/110 (118) post mobility.   Johnny [name] Mobility Specialist Please contact via Special educational needs teacher or  Rehab office at [phone]

## 2024-02-06 NOTE — Progress Notes (Signed)
 Physical Therapy Treatment Patient Details Name: Johnny [name] MRN: [id number] DOB: [birth year] Today's Date: 02/06/2024   History of Present Illness Pt is a 78 y/o male presenting with worsening exertional SOB and fatigue, also having frequent episodes of lightheadedness and dizziness.  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  PMH: SSS s/p BSCI DC PPM (DOI 04/2021), paroxysmal AF, CAD s/p CABG, PCI, moderate AS, HFpEF, severe OSA, CKD3, DM2, HLD and HTN    PT Comments  Pt received in supine after finishing his lunch meal, pt sleeping but awoken with significant verbal/tactile cues and increased time to awaken. VSS on RA throughout, pt remains slightly lethargic but once standing mostly keeping his eyes open and appears A&O. Pt with frequent hiccups at rest and with activity. Orthostatics checked and stable, pt c/o BLE weakness/fatigue toward end of session but no overt buckling using RW for support. Pt demonstrates improving activity tolerance but still needing mod safety cues and up to minA for lift/stability assist along with AD, at baseline pt uses cane vs no AD. Patient will benefit from continued inpatient follow up therapy, <3 hours/day.   Vital Signs  Patient Position (if appropriate) Orthostatic Vitals  Orthostatic Lying   BP- Lying 113/82 (HOB 30 deg)  Pulse- Lying 60  Orthostatic Sitting  BP- Sitting 102/80  Pulse- Sitting 60  Orthostatic Standing at 0 minutes  BP- Standing at 0 minutes 124/88  Pulse- Standing at 0 minutes 62     If plan is discharge home, recommend the following: A little help with walking and/or transfers;A little help with bathing/dressing/bathroom;Assistance with cooking/housework;Assist for transportation;Help with stairs or ramp for entrance   Can travel by private vehicle     No  Equipment Recommendations  None recommended by PT    Recommendations for Other Services       Precautions / Restrictions Precautions Precautions: Fall Recall of Precautions/Restrictions:  Impaired Precaution/Restrictions Comments: some decreased safety awareness 2/2 lethargy Restrictions Weight Bearing Restrictions Per Provider Order: No     Mobility  Bed Mobility Overal bed mobility: Needs Assistance Bed Mobility: Sit to Sidelying, Supine to Sit Rolling: Used rails, Supervision   Supine to sit: Min assist, HOB elevated, Used rails   Sit to sidelying: Min assist, Used rails General bed mobility comments: needs bed rail and significant HHA to pull trunk up to sitting but good effort on part of pt. CGA to minA for BLE clearance over edge of bed returning to supine    Transfers Overall transfer level: Needs assistance Equipment used: Rolling walker (2 wheels), Straight cane Transfers: Sit to/from Stand Sit to Stand: Min assist           General transfer comment: EOB<>RW, cues for safe UE placement, minA lift assist    Ambulation/Gait Ambulation/Gait assistance: Min assist, Contact guard assist Gait Distance (Feet): 75 Feet (x2 with rest break) Assistive device: Rolling walker (2 wheels) Gait Pattern/deviations: Step-through pattern, Drifts right/left, Narrow base of support       General Gait Details: CGA with intermittent minA for stability with some lateral LOB. SpO2 WFL on RA, HR 60's bpm.   Stairs             Wheelchair Mobility     Tilt Bed    Modified Rankin (Stroke Patients Only)       Balance Overall balance assessment: Needs assistance Sitting-balance support: No upper extremity supported, Single extremity supported, Feet supported Sitting balance-Leahy Scale: Fair Sitting balance - Comments: able to sit EOB without assist, but in general  today, looks and acts dazed/groggy   Standing balance support: Single extremity supported, No upper extremity supported, During functional activity Standing balance-Leahy Scale: Poor Standing balance comment: reliant on AD or external support                             Communication Communication Communication: Impaired Factors Affecting Communication: Hearing impaired  Cognition Arousal: Lethargic Behavior During Therapy: WFL for tasks assessed/performed   PT - Cognitive impairments: No family/caregiver present to determine baseline, Safety/Judgement, Problem solving                       PT - Cognition Comments: Pt tending to keep eyes closed while supine/seated but will open to cues. Closes eyes during standing BP. VSS on RA, pt reports hiccups have been plaguing him for past 5 or so days, before that did not have a lot of issues with hiccups. Following commands: Intact      Cueing Cueing Techniques: Verbal cues, Gestural cues  Exercises      General Comments General comments (skin integrity, edema, etc.): See orthostatics above; WFL, c/o BLE fatigue toward end of session but no overt buckling      Pertinent Vitals/Pain Pain Assessment Pain Assessment: Faces Faces Pain Scale: Hurts a little bit Pain Location: abdominals due to hiccups Pain Descriptors / Indicators: Aching, Discomfort Pain Intervention(s): Monitored during session, Repositioned    Home Living                          Prior Function            PT Goals (current goals can now be found in the care plan section) Acute Rehab PT Goals Patient Stated Goal: Indepedent at home. PT Goal Formulation: Patient unable to participate in goal setting Time For Goal Achievement: 02/09/24 Progress towards PT goals: Progressing toward goals    Frequency    Min 3X/week      PT Plan      Co-evaluation              AM-PAC PT 6 Clicks Mobility   Outcome Measure  Help needed turning from your back to your side while in a flat bed without using bedrails?: A Little Help needed moving from lying on your back to sitting on the side of a flat bed without using bedrails?: A Little Help needed moving to and from a bed to a chair (including a wheelchair)?:  A Little Help needed standing up from a chair using your arms (e.g., wheelchair or bedside chair)?: A Little Help needed to walk in hospital room?: A Little Help needed climbing 3-5 steps with a railing? : Total 6 Click Score: 16    End of Session Equipment Utilized During Treatment: Gait belt Activity Tolerance: Patient limited by fatigue Patient left: in bed;with call bell/phone within reach;with bed alarm set (HOB >30 deg due to pt burping/frequent hiccups) Nurse Communication: Mobility status;Other (comment) (pt needs a new gown/bath due to food on his gown) PT Visit Diagnosis: Unsteadiness on feet (R26.81);Other abnormalities of gait and mobility (R26.89);Difficulty in walking, not elsewhere classified (R26.2)     Time: 8581-8558 PT Time Calculation (min) (ACUTE ONLY): 23 min  Charges:    $Gait Training: 8-22 mins $Therapeutic Activity: 8-22 mins PT General Charges $$ ACUTE PT VISIT: 1 Visit  Connell SQUIBB., PTA Acute Rehabilitation Services Secure Chat Preferred 9a-5:30pm Office: [phone]    Connell HERO [facility] 02/06/2024, 2:56 PM

## 2024-02-06 NOTE — TOC Progression Note (Signed)
 Transition of Care [facility]) - Progression Note    Patient Details  Name: [name] MRN: [id number] Date of Birth: [birth year]  Transition of [facility]) CM/SW Contact  [name], [place] Phone Number: 02/06/2024, 12:52 PM  Clinical Narrative:     Received pasrr #  [id number]  Expected Discharge Plan: Skilled Nursing Facility Barriers to Discharge: Awaiting State Approval (PASRR), Insurance Authorization (SNF bed choice)  Expected Discharge Plan and Services In-house Referral: Clinical Social Work     Living arrangements for the past 2 months: Single Family Home                                       Social Determinants of Health (SDOH) Interventions SDOH Screenings   Food Insecurity: Food Insecurity Present (01/31/2024)  Housing: Low Risk  (01/31/2024)  Transportation Needs: Unmet Transportation Needs (01/31/2024)  Utilities: Not At Risk (01/31/2024)  Alcohol Screen: Low Risk  (07/02/2023)  Depression (PHQ2-9): High Risk (11/30/2023)  Financial Resource Strain: High Risk (07/02/2023)  Physical Activity: Unknown (07/02/2023)  Social Connections: Moderately Isolated (01/31/2024)  Stress: Stress Concern Present (07/02/2023)  Tobacco Use: Medium Risk (01/30/2024)    Readmission Risk Interventions     No data to display

## 2024-02-07 DIAGNOSIS — M79605 Pain in left leg: Secondary | ICD-10-CM | POA: Diagnosis not present

## 2024-02-07 DIAGNOSIS — M6281 Muscle weakness (generalized): Secondary | ICD-10-CM | POA: Diagnosis not present

## 2024-02-07 DIAGNOSIS — I480 Paroxysmal atrial fibrillation: Secondary | ICD-10-CM | POA: Diagnosis not present

## 2024-02-07 DIAGNOSIS — N1832 Chronic kidney disease, stage 3b: Secondary | ICD-10-CM | POA: Diagnosis not present

## 2024-02-07 DIAGNOSIS — M79604 Pain in right leg: Secondary | ICD-10-CM | POA: Diagnosis not present

## 2024-02-07 DIAGNOSIS — N183 Chronic kidney disease, stage 3 unspecified: Secondary | ICD-10-CM | POA: Diagnosis not present

## 2024-02-07 DIAGNOSIS — Z952 Presence of prosthetic heart valve: Secondary | ICD-10-CM | POA: Diagnosis not present

## 2024-02-07 DIAGNOSIS — I739 Peripheral vascular disease, unspecified: Secondary | ICD-10-CM | POA: Diagnosis not present

## 2024-02-07 DIAGNOSIS — J9611 Chronic respiratory failure with hypoxia: Secondary | ICD-10-CM | POA: Diagnosis not present

## 2024-02-07 DIAGNOSIS — R2681 Unsteadiness on feet: Secondary | ICD-10-CM | POA: Diagnosis not present

## 2024-02-07 DIAGNOSIS — E1122 Type 2 diabetes mellitus with diabetic chronic kidney disease: Secondary | ICD-10-CM | POA: Diagnosis not present

## 2024-02-07 DIAGNOSIS — R066 Hiccough: Secondary | ICD-10-CM | POA: Diagnosis not present

## 2024-02-07 DIAGNOSIS — Z951 Presence of aortocoronary bypass graft: Secondary | ICD-10-CM | POA: Diagnosis not present

## 2024-02-07 DIAGNOSIS — I5032 Chronic diastolic (congestive) heart failure: Secondary | ICD-10-CM | POA: Diagnosis not present

## 2024-02-07 DIAGNOSIS — I10 Essential (primary) hypertension: Secondary | ICD-10-CM | POA: Diagnosis not present

## 2024-02-07 LAB — BASIC METABOLIC PANEL WITH GFR
Anion gap: 9 (ref 5–15)
BUN: 21 mg/dL (ref 8–23)
CO2: 29 mmol/L (ref 22–32)
Calcium: 10.8 mg/dL — ABNORMAL HIGH (ref 8.9–10.3)
Chloride: 102 mmol/L (ref 98–111)
Creatinine, Ser: 1.8 mg/dL — ABNORMAL HIGH (ref 0.61–1.24)
GFR, Estimated: 38 mL/min — ABNORMAL LOW (ref >60)
Glucose, Bld: 102 mg/dL — ABNORMAL HIGH (ref 70–99)
Potassium: 4.5 mmol/L (ref 3.5–5.1)
Sodium: 140 mmol/L (ref 135–145)

## 2024-02-07 LAB — CBC
HCT: 47.9 % (ref 39.0–52.0)
Hemoglobin: 15.4 g/dL (ref 13.0–17.0)
MCH: 28.3 pg (ref 26.0–34.0)
MCHC: 32.2 g/dL (ref 30.0–36.0)
MCV: 88.1 fL (ref 80.0–100.0)
Platelets: 190 K/uL (ref 150–400)
RBC: 5.44 MIL/uL (ref 4.22–5.81)
RDW: 17.3 % — ABNORMAL HIGH (ref 11.5–15.5)
WBC: 8.5 K/uL (ref 4.0–10.5)
nRBC: 0 % (ref 0.0–0.2)

## 2024-02-07 LAB — GLUCOSE, CAPILLARY
Glucose-Capillary: 102 mg/dL — ABNORMAL HIGH (ref 70–99)
Glucose-Capillary: 106 mg/dL — ABNORMAL HIGH (ref 70–99)
Glucose-Capillary: 104 mg/dL — ABNORMAL HIGH (ref 70–99)

## 2024-02-07 MED ORDER — FUROSEMIDE 40 MG PO TABS
40.0000 mg | ORAL_TABLET | Freq: Every day | ORAL | Status: DC
  Administered 2024-02-07: 40 mg via ORAL
  Filled 2024-02-07: qty 1

## 2024-02-07 MED ORDER — FUROSEMIDE 40 MG PO TABS: 40.0000 mg | ORAL_TABLET | Freq: Every day | ORAL | 0 refills | Status: DC

## 2024-02-07 NOTE — Progress Notes (Addendum)
 AVS documents placed in discharge packet, hand off report given to [facility] place RN, PIV removed, CCMD notified, patient has taken all of his belongings. Patient was complaining of constipations, gave him PRN meds but didn't work, informed to the PA and informed to the RN of [facility] as well. Patient is currently waiting for the transport.

## 2024-02-07 NOTE — Progress Notes (Addendum)
 HEART AND VASCULAR CENTER   MULTIDISCIPLINARY HEART VALVE TEAM  Patient Name: Johnny [name] Date of Encounter: 02/07/2024  Admit date: 01/30/2024  Primary Care Provider: Phyllis Jereld BROCKS, NP [facility] Cardiologist: Lonni LITTIE Nanas, MD  [facility] Electrophysiologist:  None   Hospital Problem List     Principal Problem:   S/P TAVR (transcatheter aortic valve replacement) Active Problems:   Stage 3b chronic kidney disease (HCC)   Mixed hyperlipidemia   PAF (paroxysmal atrial fibrillation) (HCC)   OSA (obstructive sleep apnea)   Coronary artery disease involving native coronary artery of native heart   Essential hypertension   Severe aortic stenosis   Chronic respiratory failure with hypoxia (HCC)   PAD (peripheral artery disease) (HCC)   Chronic heart failure with preserved ejection fraction (HFpEF) (HCC)     Subjective   Breathing better but still has hiccups that are unrelenting. Much more awake this AM after stopping thorazine .   Inpatient Medications    Scheduled Meds:  amiodarone   200 mg Oral Daily   apixaban   5 mg Oral BID   carvedilol   3.125 mg Oral BID WC   famotidine   40 mg Oral Daily   furosemide   40 mg Oral Daily   insulin  aspart  0-24 Units Subcutaneous TID AC & HS   isosorbide  mononitrate  30 mg Oral Daily   loratadine   10 mg Oral Daily   mirtazapine   30 mg Oral QHS   pantoprazole   40 mg Oral QAC breakfast   ranolazine   500 mg Oral BID   rosuvastatin   40 mg Oral Daily   sodium chloride  flush  3 mL Intravenous Q12H   venlafaxine  XR  150 mg Oral Q breakfast   Continuous Infusions:  sodium chloride      PRN Meds: sodium chloride , acetaminophen  **OR** acetaminophen , alum & mag hydroxide-simeth, lactulose , metoCLOPramide , ondansetron  (ZOFRAN ) IV, mouth rinse, oxyCODONE , sodium chloride  flush, traMADol    Vital Signs    Vitals:   02/07/24 0553 02/07/24 0600 02/07/24 0825 02/07/24 0826  BP:   (!) 125/97 (!) 125/97  Pulse:   73 76   Resp:  15 20   Temp:   97.7 F (36.5 C)   TempSrc:   Oral   SpO2:   97%   Weight: 97.1 kg     Height:        Intake/Output Summary (Last 24 hours) at 02/07/2024 0935 Last data filed at 02/06/2024 1400 Gross per 24 hour  Intake 240 ml  Output --  Net 240 ml   Filed Weights   02/04/24 0508 02/05/24 0604 02/07/24 0553  Weight: 98.8 kg 98.1 kg 97.1 kg    Physical Exam    GEN: Well nourished, well developed, in no acute distress. Obese, hiccups HEENT: Grossly normal.  Neck: Supple, no JVD, carotid bruits, or masses. Cardiac: RRR, no murmurs, rubs, or gallops. No clubbing, cyanosis, edema.   Respiratory:  Respirations regular and unlabored, clear to auscultation bilaterally. GI: Soft, nontender, nondistended, BS + x 4. MS: no deformity or atrophy. Skin: warm and dry, no rash. Neuro:  Strength and sensation are intact. Psych: AAOx3.  Normal affect.  Labs    CBC Recent Labs    02/07/24 0328  WBC 8.5  HGB 15.4  HCT 47.9  MCV 88.1  PLT 190   Basic Metabolic Panel Recent Labs    92/91/74 0832 02/07/24 0748  NA 138 140  K 3.8 4.5  CL 103 102  CO2 26 29  GLUCOSE 164* 102*  BUN  26* 21  CREATININE 1.80* 1.80*  CALCIUM  10.4* 10.8*    Telemetry    paced - Personally Reviewed   Patient Profile     Johnny [name] is a 78 y.o. male with a history of HFpEF, HTN, HLD, morbid obesity (BMI 37), DMT2, CKD stage IIIb, PAF on Eliquis , SSS s/p PPM, CAD s/p PCI to RCA and LCx & CABG (2015), and severe AS who presented to [facility] on 01/30/24 for planned TAVR.   Assessment & Plan    Severe AS:  -- S/p successful TAVR with a 29 mm Medtronic FX + THV via the TF approach on 01/30/24.  -- Post operative echo showed EF 60%, mod concentric LVH, normally functioning TAVR with a mean gradient of 10 mmHg and no PVL as well as mild MR. -- Groin sites are stable.  -- Resumed on home Eliquis  5mg  BID.  -- Hospital course prolonged by poor baseline functional status with poor mobility, hiccups,  orthostatic hypotension and constipation. -- Plan DC to SNF when bed available.    Acute HFpEF: -- LVEDP ~24 mm hg at the time of TAVR. -- He complained of worsening SOB. -- He was diuresed with IV Lasix  40mg  x2. Lasix  stopped with bump in renal function.  -- Will resume home Lasix  40mg  daily given elevated BPs.  AKI in the setting of CKD stage IIIb: -- Creat baseline 1.39-1.64, but now settling around 1.8 after contrast with TAVR.  -- Continue to monitor.  HTN:  -- BP elevated.  -- Continue Coreg  6.25mg  BID and Imdur  30mg  daily. -- Add back home Lasix  40 mg daily and watch renal function.    CAD: -- L/RHC showed patent left circumflex and right coronary stents. The vein graft to the obtuse marginal 1 and 2 was known to be occluded. The LIMA to the LAD is known to be a atretic and was not imaged. There was competitive flow seen in the distal LAD. The small LIMA to the LAD appears patent. There was an occluded saphenous vein graft to the obtuse marginal branches. -- Continue medical therapy.    PAF: -- Resumed on home Eliquis  5mg  BID.  -- Continue Coreg  6.25mg  BID.  -- Continue amiodarone  200 mg daily.    Hiccups: -- Trialed Reglan  with no help. -- Given thorazine  with no help and extreme sedation. This has been stopped and should not be restarted.   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 Recommend annual imaging followup by CTA or MRA. -- This will be dicussed in the outpatient setting.    SSS s/p pacemaker: -- Has been seen by Dr. Francyne in the past but lost to follow up. -- Device interrogated in the hospital and will get him re-established in the outpatient setting.   Dispo: awaiting SNF placement. Continue to work with PT/OT  Signed, Lamarr Hummer, PA-C  02/07/2024, 9:35 AM  Pager [phone]

## 2024-02-07 NOTE — Care Management Important Message (Signed)
 Important Message  Patient Details  Name: Johnny [name] MRN: [id number] Date of Birth: [birth year]   Important Message Given:  Yes - Medicare IM     Claretta Deed 02/07/2024, 4:25 PM

## 2024-02-07 NOTE — Progress Notes (Signed)
 Mobility Specialist Progress Note:    02/07/24 0848  Mobility  Activity Ambulated with assistance in hallway;Ambulated with assistance in room  Level of Assistance Contact guard assist, steadying assist  Assistive Device Front wheel walker  Distance Ambulated (ft) 150 ft  Activity Response Tolerated well  Mobility Referral Yes  Mobility visit 1 Mobility  Mobility Specialist Start Time (ACUTE ONLY) 0848  Mobility Specialist Stop Time (ACUTE ONLY) 0858  Mobility Specialist Time Calculation (min) (ACUTE ONLY) 10 min   Pt received dangling EOB, agreeable to mobility session. Required CGA with RW d/t lateral lean when turning. HR in 60's throughout. Reported dizziness, then began hiccupping and belching. Returned pt to room, lying comfortably in bed.   Post Mobility, sitting EOB: BP 160/107 (122) Post Mobility, Supine: BP 165/94 (113)  Aayana Reinertsen Mobility Specialist Please contact via Special educational needs teacher or  Rehab office at [phone]

## 2024-02-07 NOTE — TOC Progression Note (Signed)
 Transition of Care [facility]) - Progression Note    Patient Details  Name: Johnny [name] MRN: [id number] Date of Birth: 07/22/1946  Transition of Care [facility]) CM/SW Contact  [name], [place] Phone Number: 02/07/2024, 10:46 AM  Clinical Narrative:     CSW met with patient at bedside- patient selected [facility] SNF.  [facility] confirmed bed availability.  CMA Jon, updated to start insurance auth for [employer].   [name], MSW, [name] Clinical Social Worker     Expected Discharge Plan: Skilled Nursing Facility Barrier: insurance authorization    Expected Discharge Plan and Services In-house Referral: Clinical Social Work     Living arrangements for the past 2 months: Single Family Home                                       Social Determinants of Health (SDOH) Interventions SDOH Screenings   Food Insecurity: Food Insecurity Present (01/31/2024)  Housing: Low Risk  (01/31/2024)  Transportation Needs: Unmet Transportation Needs (01/31/2024)  Utilities: Not At Risk (01/31/2024)  Alcohol Screen: Low Risk  (07/02/2023)  Depression (PHQ2-9): High Risk (11/30/2023)  Financial Resource Strain: High Risk (07/02/2023)  Physical Activity: Unknown (07/02/2023)  Social Connections: Moderately Isolated (01/31/2024)  Stress: Stress Concern Present (07/02/2023)  Tobacco Use: Medium Risk (01/30/2024)    Readmission Risk Interventions     No data to display

## 2024-02-07 NOTE — Progress Notes (Signed)
 Attempted to give report to [facility] place, called twice, not answered. Will try to call again.

## 2024-02-07 NOTE — TOC Progression Note (Signed)
 Transition of Care [facility]) - Progression Note    Patient Details  Name: Johnny [name] MRN: [id number] Date of Birth: October 31, 1945  Transition of Care [facility]) CM/SW Contact  [name], [place] Phone Number: 02/07/2024, 1:40 PM  Clinical Narrative:      Insurance authorization pending Mayme Barrows PI#[id number]   Expected Discharge Plan: Skilled Nursing Facility Barriers to Discharge: Awaiting State Approval (PASRR), Insurance Authorization (SNF bed choice)  Expected Discharge Plan and Services In-house Referral: Clinical Social Work     Living arrangements for the past 2 months: Single Family Home                                       Social Determinants of Health (SDOH) Interventions SDOH Screenings   Food Insecurity: Food Insecurity Present (01/31/2024)  Housing: Low Risk  (01/31/2024)  Transportation Needs: Unmet Transportation Needs (01/31/2024)  Utilities: Not At Risk (01/31/2024)  Alcohol Screen: Low Risk  (07/02/2023)  Depression (PHQ2-9): High Risk (11/30/2023)  Financial Resource Strain: High Risk (07/02/2023)  Physical Activity: Unknown (07/02/2023)  Social Connections: Moderately Isolated (01/31/2024)  Stress: Stress Concern Present (07/02/2023)  Tobacco Use: Medium Risk (01/30/2024)    Readmission Risk Interventions     No data to display

## 2024-02-07 NOTE — Plan of Care (Signed)
  Problem: Education: Goal: Knowledge of General Education information will improve Description: Including pain rating scale, medication(s)/side effects and non-pharmacologic comfort measures Outcome: Progressing   Problem: Clinical Measurements: Goal: Will remain free from infection Outcome: Progressing Goal: Respiratory complications will improve Outcome: Progressing   Problem: Activity: Goal: Risk for activity intolerance will decrease Outcome: Progressing   Problem: Elimination: Goal: Will not experience complications related to urinary retention Outcome: Progressing   Problem: Pain Managment: Goal: General experience of comfort will improve and/or be controlled Outcome: Progressing

## 2024-02-07 NOTE — Progress Notes (Signed)
 Occupational Therapy Treatment Patient Details Name: Johnny [name] MRN: [id number] DOB: 1945/12/18 Today's Date: 02/07/2024   History of present illness Pt is a 78 y/o male presenting with worsening exertional SOB and fatigue, also having frequent episodes of lightheadedness and dizziness.  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  PMH: SSS s/p BSCI DC PPM (DOI 04/2021), paroxysmal AF, CAD s/p CABG, PCI, moderate AS, HFpEF, severe OSA, CKD3, DM2, HLD and HTN   OT comments   Pt progressing well towards goals. Session focused on standing ADLs and activity tolerance. Improved standing balance to CGA to supervision with standing ADLs. Pt still requiring increased cues for safety with RW, and CGA for slight posterior lean after STS. Continue to recommend <3 hours of skilled rehab daily to optimize independence levels. Will continue to follow acutely.       If plan is discharge home, recommend the following:  A little help with walking and/or transfers;A little help with bathing/dressing/bathroom;Assistance with cooking/housework;Assist for transportation;Help with stairs or ramp for entrance   Equipment Recommendations  Other (comment) (Defer to next venue)    Recommendations for Other Services      Precautions / Restrictions Precautions Precautions: Fall Recall of Precautions/Restrictions: Impaired Restrictions Weight Bearing Restrictions Per Provider Order: No       Mobility Bed Mobility Overal bed mobility: Needs Assistance Bed Mobility: Supine to Sit     Supine to sit: Supervision, HOB elevated     General bed mobility comments: No use of rails, HOB minimally elevated    Transfers Overall transfer level: Needs assistance Equipment used: Rolling walker (2 wheels), Straight cane Transfers: Sit to/from Stand, Bed to chair/wheelchair/BSC Sit to Stand: Contact guard assist     Step pivot transfers: Contact guard assist     General transfer comment: CGA for safety, slight posterior lean with  inital STS,     Balance Overall balance assessment: Needs assistance Sitting-balance support: No upper extremity supported, Feet supported Sitting balance-Leahy Scale: Good     Standing balance support: Bilateral upper extremity supported, During functional activity, Reliant on assistive device for balance Standing balance-Leahy Scale: Fair Standing balance comment: Can complete standing ADLs at [facility] supervision without UE support       ADL either performed or assessed with clinical judgement   ADL Overall ADL's : Needs assistance/impaired     Grooming: Standing;Supervision/safety;Wash/dry Programmer, applications Details (indicate cue type and reason): Adequate balance             Lower Body Dressing: Minimal assistance;Sit to/from stand Lower Body Dressing Details (indicate cue type and reason): Unable to reach feet for socks Toilet Transfer: Ambulation;Rolling walker (2 wheels);Contact guard Armed forces operational officer Details (indicate cue type and reason): CGA for balance,  slight posterior  lean Toileting- Clothing Manipulation and Hygiene: Supervision/safety;Sitting/lateral lean       Functional mobility during ADLs: Contact guard assist;Rolling walker (2 wheels)      Extremity/Trunk Assessment Upper Extremity Assessment Upper Extremity Assessment: Generalized weakness RUE Deficits / Details: pt reports receiving shots in his R shoulder for pain management LUE Deficits / Details: hx of L shoulder surgery   Lower Extremity Assessment Lower Extremity Assessment: Defer to PT evaluation        Vision   Vision Assessment?: No apparent visual deficits         Communication Communication Communication: Impaired Factors Affecting Communication: Hearing impaired   Cognition Arousal: Alert Behavior During Therapy: WFL for tasks assessed/performed Cognition: No apparent impairments  OT - Cognition Comments: WFL, some decreased safety  awareness       Following commands: Intact        Cueing   Cueing Techniques: Verbal cues, Gestural cues        General Comments VSS on RA    Pertinent Vitals/ Pain       Pain Assessment Pain Assessment: No/denies pain Pain Intervention(s): Monitored during session   Frequency  Min 2X/week        Progress Toward Goals  OT Goals(current goals can now be found in the care plan section)  Progress towards OT goals: Progressing toward goals  Acute Rehab OT Goals Patient Stated Goal: To get better OT Goal Formulation: With patient Time For Goal Achievement: 02/17/24 Potential to Achieve Goals: Good ADL Goals Pt Will Perform Grooming: with modified independence;standing Pt Will Perform Upper Body Dressing: Independently Pt Will Perform Lower Body Dressing: sit to/from stand;with modified independence Pt Will Transfer to Toilet: ambulating;with modified independence Pt/caregiver will Perform Home Exercise Program: Increased ROM;Increased strength;Both right and left upper extremity Additional ADL Goal #1: Pt will perform 10+ mins OOB ADL to optimize activity tolerance.  Plan         AM-PAC OT 6 Clicks Daily Activity     Outcome Measure   Help from another person eating meals?: None Help from another person taking care of personal grooming?: A Little Help from another person toileting, which includes using toliet, bedpan, or urinal?: A Little Help from another person bathing (including washing, rinsing, drying)?: A Little Help from another person to put on and taking off regular upper body clothing?: A Little Help from another person to put on and taking off regular lower body clothing?: A Little 6 Click Score: 19    End of Session Equipment Utilized During Treatment: Gait belt;Rolling walker (2 wheels)  OT Visit Diagnosis: Unsteadiness on feet (R26.81);Muscle weakness (generalized) (M62.81)   Activity Tolerance Patient tolerated treatment well   Patient Left  in bed;with call bell/phone within reach;with bed alarm set   Nurse Communication Mobility status        Time: [phone] OT Time Calculation (min): 13 min  Charges: OT General Charges $OT Visit: 1 Visit OT Treatments $Self Care/Home Management : 8-22 mins  Johnny [name], OT  Acute Rehabilitation Services Office [phone] Secure chat preferred   Johnny [name] 02/07/2024, 8:23 AM

## 2024-02-07 NOTE — TOC Transition Note (Signed)
 Transition of Care [facility]) - Discharge Note   Patient Details  Name: Johnny [name] MRN: [id number] Date of Birth: [birth year]  Transition of Care [facility]) CM/SW Contact:  [name], [name] Phone Number: 02/07/2024, 3:21 PM   Clinical Narrative:     Patient will Discharge to: [facility]  Discharge Date: 02/07/24 Family Notified: called friend, no answer Transport By: ROME  Per MD patient is ready for discharge. RN, patient, and facility notified of discharge. Discharge Summary sent to facility. RN given number for [phone]. Ambulance transport requested for patient.   Clinical Social Worker signing off.  [name], MSW, [name] Clinical Social Worker     Final next level of care: Skilled Nursing Facility Barriers to Discharge: Barriers Resolved   Patient Goals and CMS Choice Patient states their goals for this hospitalization and ongoing recovery are:: SNF   Choice offered to / list presented to : Patient      Discharge Placement              Patient chooses bed at:  [facility]) Patient to be transferred to facility by: [facility] Name of family member notified: called friend, no answer Patient and family notified of of transfer: 02/07/24  Discharge Plan and Services Additional resources added to the After Visit Summary for   In-house Referral: Clinical Social Work                                   Social Drivers of Health (SDOH) Interventions SDOH Screenings   Food Insecurity: Food Insecurity Present (01/31/2024)  Housing: Low Risk  (01/31/2024)  Transportation Needs: Unmet Transportation Needs (01/31/2024)  Utilities: Not At Risk (01/31/2024)  Alcohol Screen: Low Risk  (07/02/2023)  Depression (PHQ2-9): High Risk (11/30/2023)  Financial Resource Strain: High Risk (07/02/2023)  Physical Activity: Unknown (07/02/2023)  Social Connections: Moderately Isolated (01/31/2024)  Stress: Stress Concern Present (07/02/2023)  Tobacco Use: Medium Risk  (01/30/2024)     Readmission Risk Interventions     No data to display

## 2024-02-08 DIAGNOSIS — I129 Hypertensive chronic kidney disease with stage 1 through stage 4 chronic kidney disease, or unspecified chronic kidney disease: Secondary | ICD-10-CM | POA: Diagnosis not present

## 2024-02-08 DIAGNOSIS — I4891 Unspecified atrial fibrillation: Secondary | ICD-10-CM | POA: Diagnosis not present

## 2024-02-08 DIAGNOSIS — Z952 Presence of prosthetic heart valve: Secondary | ICD-10-CM | POA: Diagnosis not present

## 2024-02-08 DIAGNOSIS — I350 Nonrheumatic aortic (valve) stenosis: Secondary | ICD-10-CM | POA: Diagnosis not present

## 2024-02-08 DIAGNOSIS — Z5181 Encounter for therapeutic drug level monitoring: Secondary | ICD-10-CM | POA: Diagnosis not present

## 2024-02-08 DIAGNOSIS — I509 Heart failure, unspecified: Secondary | ICD-10-CM | POA: Diagnosis not present

## 2024-02-09 DIAGNOSIS — I5032 Chronic diastolic (congestive) heart failure: Secondary | ICD-10-CM | POA: Diagnosis not present

## 2024-02-09 DIAGNOSIS — I480 Paroxysmal atrial fibrillation: Secondary | ICD-10-CM | POA: Diagnosis not present

## 2024-02-09 DIAGNOSIS — M6281 Muscle weakness (generalized): Secondary | ICD-10-CM | POA: Diagnosis not present

## 2024-02-09 DIAGNOSIS — R2689 Other abnormalities of gait and mobility: Secondary | ICD-10-CM | POA: Diagnosis not present

## 2024-02-09 DIAGNOSIS — Z952 Presence of prosthetic heart valve: Secondary | ICD-10-CM | POA: Diagnosis not present

## 2024-02-10 DIAGNOSIS — G4733 Obstructive sleep apnea (adult) (pediatric): Secondary | ICD-10-CM | POA: Diagnosis not present

## 2024-02-11 DIAGNOSIS — G4733 Obstructive sleep apnea (adult) (pediatric): Secondary | ICD-10-CM | POA: Diagnosis not present

## 2024-02-11 DIAGNOSIS — J449 Chronic obstructive pulmonary disease, unspecified: Secondary | ICD-10-CM | POA: Diagnosis not present

## 2024-02-12 ENCOUNTER — Ambulatory Visit: Attending: Physician Assistant | Admitting: Physician Assistant

## 2024-02-12 VITALS — BP 108/70 | HR 58 | Ht 65.0 in | Wt 217.0 lb

## 2024-02-12 DIAGNOSIS — M79604 Pain in right leg: Secondary | ICD-10-CM | POA: Diagnosis not present

## 2024-02-12 DIAGNOSIS — N183 Chronic kidney disease, stage 3 unspecified: Secondary | ICD-10-CM

## 2024-02-12 DIAGNOSIS — E1122 Type 2 diabetes mellitus with diabetic chronic kidney disease: Secondary | ICD-10-CM

## 2024-02-12 DIAGNOSIS — I4891 Unspecified atrial fibrillation: Secondary | ICD-10-CM | POA: Diagnosis not present

## 2024-02-12 DIAGNOSIS — I5032 Chronic diastolic (congestive) heart failure: Secondary | ICD-10-CM | POA: Diagnosis not present

## 2024-02-12 DIAGNOSIS — Z951 Presence of aortocoronary bypass graft: Secondary | ICD-10-CM

## 2024-02-12 DIAGNOSIS — I129 Hypertensive chronic kidney disease with stage 1 through stage 4 chronic kidney disease, or unspecified chronic kidney disease: Secondary | ICD-10-CM | POA: Diagnosis not present

## 2024-02-12 DIAGNOSIS — I480 Paroxysmal atrial fibrillation: Secondary | ICD-10-CM | POA: Diagnosis not present

## 2024-02-12 DIAGNOSIS — I10 Essential (primary) hypertension: Secondary | ICD-10-CM | POA: Diagnosis not present

## 2024-02-12 DIAGNOSIS — I509 Heart failure, unspecified: Secondary | ICD-10-CM | POA: Diagnosis not present

## 2024-02-12 DIAGNOSIS — Z5181 Encounter for therapeutic drug level monitoring: Secondary | ICD-10-CM | POA: Diagnosis not present

## 2024-02-12 DIAGNOSIS — Z952 Presence of prosthetic heart valve: Secondary | ICD-10-CM | POA: Diagnosis not present

## 2024-02-12 DIAGNOSIS — R066 Hiccough: Secondary | ICD-10-CM | POA: Diagnosis not present

## 2024-02-12 DIAGNOSIS — M79605 Pain in left leg: Secondary | ICD-10-CM | POA: Diagnosis not present

## 2024-02-12 DIAGNOSIS — I350 Nonrheumatic aortic (valve) stenosis: Secondary | ICD-10-CM | POA: Diagnosis not present

## 2024-02-12 MED ORDER — AMOXICILLIN 500 MG PO TABS: 2000.0000 mg | ORAL_TABLET | ORAL | 12 refills | Status: AC

## 2024-02-12 NOTE — Patient Instructions (Signed)
 Medication Instructions:  Start Amoxicillin  500 mg, take 4 tablets by mouth 1 hour prior to dental procedures and cleanings.   *If you need a refill on your cardiac medications before your next appointment, please call your pharmacy*  Lab Work: None needed If you have labs (blood work) drawn today and your tests are completely normal, you will receive your results only by: MyChart Message (if you have MyChart) OR A paper copy in the mail If you have any lab test that is abnormal or we need to change your treatment, we will call you to review the results.  Testing/Procedures: Your physician has requested that you have a lower or upper extremity arterial duplex. This test is an ultrasound of the arteries in the legs or arms. It looks at arterial blood flow in the legs and arms. Allow one hour for Lower and Upper Arterial scans. There are no restrictions or special instructions.  Please note: We ask at that you not bring children with you during ultrasound (echo/ vascular) testing. Due to room size and safety concerns, children are not allowed in the ultrasound rooms during exams. Our front office staff cannot provide observation of children in our lobby area while testing is being conducted. An adult accompanying a patient to their appointment will only be allowed in the ultrasound room at the discretion of the ultrasound technician under special circumstances. We apologize for any inconvenience.  Follow-Up: At [facility], you and your health needs are our priority.  As part of our continuing mission to provide you with exceptional heart care, our providers are all part of one team.  This team includes your primary Cardiologist (physician) and Advanced Practice Providers or APPs (Physician Assistants and Nurse Practitioners) who all work together to provide you with the care you need, when you need it.  Your next appointment:   As scheduled  Provider:   Izetta Hummer, PA-C    We  recommend signing up for the patient portal called MyChart.  Sign up information is provided on this After Visit Summary.  MyChart is used to connect with patients for Virtual Visits (Telemedicine).  Patients are able to view lab/test results, encounter notes, upcoming appointments, etc.  Non-urgent messages can be sent to your provider as well.   To learn more about what you can do with MyChart, go to [email or web address]

## 2024-02-12 NOTE — Progress Notes (Unsigned)
 [facility]   MULTIDISCIPLINARY HEART VALVE CLINIC                                     Cardiology Office Note:    Date:  02/14/2024   ID:  Johnny [name], DOB Dec 04, 1945, MRN [id number]  PCP:  Johnny Jereld BROCKS, NP  [facility] Cardiologist:  Johnny LITTIE Nanas, MD  [facility] Structural heart: Johnny Fell, MD [facility] Electrophysiologist:  None   Referring MD: Johnny [name]*   TOC s/p TAVR  History of Present Illness:    Johnny [name] is a 78 y.o. 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   He developed worsening exertional shortness of breath and fatigue as well as frequent episodes of dizziness with position changes. His most recent echocardiogram on 11/06/2023 showed EF 60% and severe AS with mean grad 32 mmHg, AVA 0.81 cm2, SVI 31 and mild AI. L/RHC showed patent left circumflex and right coronary stents. The vein graft to the obtuse marginal 1 and 2 was known to be occluded. The LIMA to the LAD is known to be a atretic and was not imaged. There was competitive flow seen in the distal LAD. The small LIMA to the LAD appears patent. There was an occluded saphenous vein graft to the obtuse marginal branches. S/p successful TAVR with a 29 mm Medtronic FX + THV via the TF approach on 01/30/24. Post operative echo showed EF 60%, mod concentric LVH, normally functioning TAVR with a mean gradient of 10 mmHg and no PVL as well as mild MR. Hospital course prolonged by poor baseline functional status with poor mobility, acute CHF, hiccups, orthostatic hypotension AKI, sedation and constipation. He was discharged to a SNF on 02/07/24.  Today the patient presents to clinic for follow up. He is currently at [facility]. His mobility is improving working with PT. He had hiccups for  17 days straight but finally stopped. No CP or SOB. No LE edema, orthopnea or PND. No dizziness or syncope. No blood in stool or urine. No palpitations.  Has some pain in legs with ambulation that is relieved with rest. This is limiting his ability to move as much as he would like.     Past Medical History:  Diagnosis Date   Atrial fibrillation (HCC)    Deafness in left ear    Hypertension    S/P TAVR (transcatheter aortic valve replacement) 01/30/2024   s/p TAVR with a 29 mm Medtronic FX + THV via the TF approach by Johnny [name] and Johnny [name]   Symptomatic bradycardia 04/2021   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[place], [place]     Current Medications: Current Meds  Medication Sig   albuterol  (VENTOLIN  HFA) 108 (90 Base) MCG/ACT inhaler Inhale 2 puffs into the lungs every 6 (six) hours as needed for wheezing or shortness of breath.   amiodarone  (PACERONE ) 200 MG tablet TAKE 1 TABLET BY MOUTH DAILY   amoxicillin  (AMOXIL ) 500 MG tablet Take 4 tablets (2,000 mg total) by mouth as directed. 1 hour prior to dental work including cleanings   apixaban  (ELIQUIS ) 5 MG TABS tablet TAKE 1 TABLET BY MOUTH TWICE  DAILY   carvedilol  (  COREG ) 6.25 MG tablet TAKE 1 TABLET BY MOUTH TWICE  DAILY   cyanocobalamin  (VITAMIN B12) 1000 MCG tablet Take 1 tablet (1,000 mcg total) by mouth daily.   famotidine  (PEPCID ) 40 MG tablet Take 40 mg by mouth daily.   fluticasone  (FLONASE ) 50 MCG/ACT nasal spray Place 2 sprays into both nostrils daily.   furosemide  (LASIX ) 40 MG tablet Take 1 tablet (40 mg total) by mouth daily.   glucose blood test strip 1 each by Other route daily. [facility] [facility] Glucometer   isosorbide  mononitrate (IMDUR ) 30 MG 24 hr tablet TAKE 1 TABLET BY MOUTH DAILY   loratadine  (CLARITIN ) 10 MG tablet Take 1 tablet (10 mg total) by mouth daily.   mirtazapine  (REMERON  SOL-TAB) 30 MG disintegrating tablet Take 1 tablet (30 mg total) by mouth at bedtime.   montelukast  (SINGULAIR ) 10 MG tablet Take 10 mg by mouth daily.    nitroGLYCERIN  (NITROSTAT ) 0.4 MG SL tablet DISSOLVE 1 TABLET UNDER THE  TONGUE EVERY 5 MINUTES AS NEEDED FOR CHEST PAIN. MAX OF 3 TABLETS IN 15 MINUTES. CALL 911 IF PAIN  PERSISTS.   pantoprazole  (PROTONIX ) 40 MG tablet TAKE 1 TABLET BY MOUTH DAILY  BEFORE BREAKFAST   ranolazine  (RANEXA ) 500 MG 12 hr tablet Take 1 tablet (500 mg total) by mouth 2 (two) times daily.   rosuvastatin  (CRESTOR ) 40 MG tablet TAKE 1 TABLET BY MOUTH DAILY   venlafaxine  XR (EFFEXOR -XR) 150 MG 24 hr capsule TAKE 1 CAPSULE BY MOUTH DAILY  WITH BREAKFAST      ROS:   Please see the history of present illness.    All other systems reviewed and are negative.  EKGs       Risk Assessment/Calculations:           Physical Exam:    VS:  BP 108/70   Pulse (!) 58   Ht 5' 5 (1.651 m)   Wt 217 lb (98.4 kg)   SpO2 (!) 81%   BMI 36.11 kg/m     Wt Readings from Last 3 Encounters:  02/12/24 217 lb (98.4 kg)  02/07/24 214 lb 1.6 oz (97.1 kg)  01/23/24 218 lb (98.9 kg)     GEN: Well nourished, well developed in no acute distress, obese  NECK: No JVD CARDIAC: RRR, no murmurs, rubs, gallops RESPIRATORY:  Clear to auscultation without rales, wheezing or rhonchi  ABDOMEN: Soft, non-tender, non-distended EXTREMITIES:  No edema; No deformity.  Groin sites clear without hematoma or ecchymosis.   ASSESSMENT:    1. S/P TAVR (transcatheter aortic valve replacement)   2. Chronic heart failure with preserved ejection fraction (HCC)   3. CKD stage 3 due to type 2 diabetes mellitus (HCC)   4. Essential hypertension   5. Hx of CABG   6. Paroxysmal atrial fibrillation (HCC)   7. Hiccups   8. Pain in both lower extremities     PLAN:    In order of problems listed above:  Severe AS s/p TAVR:  -- Pt doing well s/p TAVR.  -- Groin sites healing well.  -- SBE prophylaxis discussed; I have RX'd amoxicillin .  -- Continue Eliquis  5mg  BID.  -- Cleared to resume all activities without restriction. -- I will see back for  1 month echo and OV.  HFpEF: -- Appears euvolemic.  -- Continue Lasix  40mg   -- BMET today.    AKI in the setting of CKD stage IIIb: -- Creat baseline 1.39-1.64, but settled ~ 1.8 after contrast with TAVR.  -- BMET today.  HTN:  -- BP well controlled.  -- Continue Coreg  6.25mg  BID, Imdur  30mg  daily and Lasix  40 mg daily.   CAD s/p CABG: -- L/RHC showed patent left circumflex and right coronary stents. The vein graft to the obtuse marginal 1 and 2 was known to be occluded. The LIMA to the LAD is known to be a atretic and was not imaged. There was competitive flow seen in the distal LAD. The small LIMA to the LAD appears patent. There was an occluded saphenous vein graft to the obtuse marginal branches. -- Continue medical therapy.    PAF: -- Resumed on home Eliquis  5mg  BID.  -- Continue Coreg  6.25mg  BID.  -- Continue amiodarone  200 mg daily.    Hiccups: -- Self resolved after 17 days.   Exertional LE pain:  -- Will get a LE arterial duplex set up.      Cardiac Rehabilitation Eligibility Assessment  The patient is ready to start cardiac rehabilitation from a cardiac standpoint.     Medication Adjustments/Labs and Tests Ordered: Current medicines are reviewed at length with the patient today.  Concerns regarding medicines are outlined above.  Orders Placed This Encounter  Procedures   VAS US  ABI WITH/WO TBI   Meds ordered this encounter  Medications   amoxicillin  (AMOXIL ) 500 MG tablet    Sig: Take 4 tablets (2,000 mg total) by mouth as directed. 1 hour prior to dental work including cleanings    Dispense:  12 tablet    Refill:  12    Supervising Provider:   WONDA SHARPER [[id number]]    Patient Instructions  Medication Instructions:  Start Amoxicillin  500 mg, take 4 tablets by mouth 1 hour prior to dental procedures and cleanings.   *If you need a refill on your cardiac medications before your next appointment, please call your pharmacy*  Lab Work: None  needed If you have labs (blood work) drawn today and your tests are completely normal, you will receive your results only by: MyChart Message (if you have MyChart) OR A paper copy in the mail If you have any lab test that is abnormal or we need to change your treatment, we will call you to review the results.  Testing/Procedures: Your physician has requested that you have a lower or upper extremity arterial duplex. This test is an ultrasound of the arteries in the legs or arms. It looks at arterial blood flow in the legs and arms. Allow one hour for Lower and Upper Arterial scans. There are no restrictions or special instructions.  Please note: We ask at that you not bring children with you during ultrasound (echo/ vascular) testing. Due to room size and safety concerns, children are not allowed in the ultrasound rooms during exams. Our front office staff cannot provide observation of children in our lobby area while testing is being conducted. An adult accompanying a patient to their appointment will only be allowed in the ultrasound room at the discretion of the ultrasound technician under special circumstances. We apologize for any inconvenience.  Follow-Up: At [facility], you and your health needs are our priority.  As part of our continuing mission to provide you with exceptional heart care, our providers are all part of one team.  This team includes your primary Cardiologist (physician) and Advanced Practice Providers or APPs (Physician Assistants and Nurse Practitioners) who all work together to provide you with the care you need, when you need it.  Your next appointment:   As scheduled  Provider:  Izetta Hummer, PA-C    We recommend signing up for the patient portal called MyChart.  Sign up information is provided on this After Visit Summary.  MyChart is used to connect with patients for Virtual Visits (Telemedicine).  Patients are able to view lab/test results, encounter  notes, upcoming appointments, etc.  Non-urgent messages can be sent to your provider as well.   To learn more about what you can do with MyChart, go to [email or web address]         Signed, Lamarr Hummer, PA-C  02/14/2024 2:55 AM    [facility]

## 2024-02-13 ENCOUNTER — Ambulatory Visit: Payer: Self-pay

## 2024-02-13 DIAGNOSIS — I480 Paroxysmal atrial fibrillation: Secondary | ICD-10-CM | POA: Diagnosis not present

## 2024-02-13 DIAGNOSIS — M6281 Muscle weakness (generalized): Secondary | ICD-10-CM | POA: Diagnosis not present

## 2024-02-13 DIAGNOSIS — I5032 Chronic diastolic (congestive) heart failure: Secondary | ICD-10-CM | POA: Diagnosis not present

## 2024-02-13 DIAGNOSIS — R2689 Other abnormalities of gait and mobility: Secondary | ICD-10-CM | POA: Diagnosis not present

## 2024-02-13 DIAGNOSIS — Z952 Presence of prosthetic heart valve: Secondary | ICD-10-CM | POA: Diagnosis not present

## 2024-02-13 NOTE — Telephone Encounter (Signed)
 Noted

## 2024-02-13 NOTE — Telephone Encounter (Signed)
  FYI Only or Action Required?: Action required by provider: request for appointment and co-ordination of care.Also requesting a cpap.  Patient was last seen in primary care on 11/30/2023 by Medina-Vargas, Jereld BROCKS, NP.  Called Nurse Triage reporting Spasms.  Symptoms began ongoing for years and years.  Interventions attempted: OTC medications: Tylenol .  Symptoms are: unchanged. Pt is reporting muscle spasms, and pain. He reports that ortho has recommended sx for knees and back. He does not want this sx. Pt is currently in rehab for heart sx.  Asked pt how I could help and pt had no answer. He does states that if he needs to be seen in office, transportation needs to be arranged with [facility] [phone].  Triage Disposition: Callback by PCP Today  Patient/caregiver understands and will follow disposition?: YesCopied from CRM [id number]. Topic: Clinical - Red Word Triage                          >> Feb 13, 2024  9:23 AM Alfonso ORN wrote: Red Word that prompted transfer to Nurse Triage: experiencing pain and muscle spasms  lower back ,right knee and right shoulder   patient call back # [phone] Answer Assessment - Initial Assessment Questions 1. REASON FOR CALL: What is the main reason for your call? or How can I best help you?     Pt has long term ongoing muscle spasms, pain among other issues 2. SYMPTOMS : Do you have any symptoms?      Pain  Protocols used: Information Only Call - No Triage-A-AH

## 2024-02-13 NOTE — Telephone Encounter (Signed)
 Called and spoke with Corean at [facility] and she is going to put a note in for the Facility Provider to Evaluate patient.   No appointment needed here at the office, patient is going to be seen at the Facility. Patient is Aware.

## 2024-02-13 NOTE — Telephone Encounter (Signed)
 No Available appointment in office today. Patient is in Rehab at [facility].   Please Advise.

## 2024-02-14 ENCOUNTER — Telehealth (HOSPITAL_COMMUNITY): Payer: Self-pay

## 2024-02-14 DIAGNOSIS — T82857A Stenosis of cardiac prosthetic devices, implants and grafts, initial encounter: Secondary | ICD-10-CM | POA: Diagnosis not present

## 2024-02-14 DIAGNOSIS — I350 Nonrheumatic aortic (valve) stenosis: Secondary | ICD-10-CM | POA: Diagnosis not present

## 2024-02-14 DIAGNOSIS — I4891 Unspecified atrial fibrillation: Secondary | ICD-10-CM | POA: Diagnosis not present

## 2024-02-14 DIAGNOSIS — Z952 Presence of prosthetic heart valve: Secondary | ICD-10-CM | POA: Diagnosis not present

## 2024-02-14 DIAGNOSIS — I1310 Hypertensive heart and chronic kidney disease without heart failure, with stage 1 through stage 4 chronic kidney disease, or unspecified chronic kidney disease: Secondary | ICD-10-CM | POA: Diagnosis not present

## 2024-02-14 DIAGNOSIS — I480 Paroxysmal atrial fibrillation: Secondary | ICD-10-CM | POA: Diagnosis not present

## 2024-02-14 DIAGNOSIS — I509 Heart failure, unspecified: Secondary | ICD-10-CM | POA: Diagnosis not present

## 2024-02-14 DIAGNOSIS — I130 Hypertensive heart and chronic kidney disease with heart failure and stage 1 through stage 4 chronic kidney disease, or unspecified chronic kidney disease: Secondary | ICD-10-CM | POA: Diagnosis not present

## 2024-02-14 NOTE — Telephone Encounter (Signed)
 Attempted to call patient regarding interest in cardiac rehab- no answer, left message to call us  back. Sent MyChart message.

## 2024-02-15 ENCOUNTER — Telehealth (HOSPITAL_COMMUNITY): Payer: Self-pay

## 2024-02-15 ENCOUNTER — Telehealth: Payer: Self-pay

## 2024-02-15 NOTE — Telephone Encounter (Signed)
 Pt insurance is active and benefits verified through [facility] Medicare. Co-pay $0, DED $0/$0 met, out of pocket $3,900/$1,277.91 met, co-insurance 0%. No pre-authorization required. 02/15/2024 @ 10:00am, spoke with Steffan RAMAN., REF# [id number].  How many CR sessions are covered? (36 visits for TCR, 72 visits for ICR)72 ICR Is this a lifetime maximum or an annual maximum? Annual Has the member used any of these services to date? No Is there a time limit (weeks/months) on start of program and/or program completion? No  Patient covered 100%. F/u on 7/25.

## 2024-02-15 NOTE — Telephone Encounter (Signed)
 Late entry:  Met with Pt after visit with KT on 02/12/2024.  Device interrogated (see Paceart for details).  Per Pt he does not have a bedside monitor for his device.  Advised Pt would order one to be sent to his residence.  Pt requests device be mailed to [address] [place] [address].  Email sent to [facility] representative requesting monitor.  Will schedule remote transmissions after Pt receives his new monitor.

## 2024-02-16 DIAGNOSIS — R2689 Other abnormalities of gait and mobility: Secondary | ICD-10-CM | POA: Diagnosis not present

## 2024-02-16 DIAGNOSIS — M6281 Muscle weakness (generalized): Secondary | ICD-10-CM | POA: Diagnosis not present

## 2024-02-16 DIAGNOSIS — N1832 Chronic kidney disease, stage 3b: Secondary | ICD-10-CM | POA: Diagnosis not present

## 2024-02-16 DIAGNOSIS — I5032 Chronic diastolic (congestive) heart failure: Secondary | ICD-10-CM | POA: Diagnosis not present

## 2024-02-16 DIAGNOSIS — Z952 Presence of prosthetic heart valve: Secondary | ICD-10-CM | POA: Diagnosis not present

## 2024-02-16 DIAGNOSIS — I480 Paroxysmal atrial fibrillation: Secondary | ICD-10-CM | POA: Diagnosis not present

## 2024-02-16 DIAGNOSIS — I5030 Unspecified diastolic (congestive) heart failure: Secondary | ICD-10-CM | POA: Diagnosis not present

## 2024-02-16 DIAGNOSIS — I130 Hypertensive heart and chronic kidney disease with heart failure and stage 1 through stage 4 chronic kidney disease, or unspecified chronic kidney disease: Secondary | ICD-10-CM | POA: Diagnosis not present

## 2024-02-16 DIAGNOSIS — I4891 Unspecified atrial fibrillation: Secondary | ICD-10-CM | POA: Diagnosis not present

## 2024-02-17 DIAGNOSIS — U071 COVID-19: Secondary | ICD-10-CM | POA: Diagnosis not present

## 2024-02-19 DIAGNOSIS — I130 Hypertensive heart and chronic kidney disease with heart failure and stage 1 through stage 4 chronic kidney disease, or unspecified chronic kidney disease: Secondary | ICD-10-CM | POA: Diagnosis not present

## 2024-02-19 DIAGNOSIS — I4891 Unspecified atrial fibrillation: Secondary | ICD-10-CM | POA: Diagnosis not present

## 2024-02-19 DIAGNOSIS — Z952 Presence of prosthetic heart valve: Secondary | ICD-10-CM | POA: Diagnosis not present

## 2024-02-20 DIAGNOSIS — U071 COVID-19: Secondary | ICD-10-CM | POA: Diagnosis not present

## 2024-02-21 ENCOUNTER — Ambulatory Visit (HOSPITAL_COMMUNITY): Admission: RE | Admit: 2024-02-21 | Source: Ambulatory Visit

## 2024-02-23 ENCOUNTER — Ambulatory Visit: Admitting: "Endocrinology

## 2024-02-23 DIAGNOSIS — G4733 Obstructive sleep apnea (adult) (pediatric): Secondary | ICD-10-CM | POA: Diagnosis not present

## 2024-02-23 DIAGNOSIS — Z9181 History of falling: Secondary | ICD-10-CM | POA: Diagnosis not present

## 2024-02-23 DIAGNOSIS — E1151 Type 2 diabetes mellitus with diabetic peripheral angiopathy without gangrene: Secondary | ICD-10-CM | POA: Diagnosis not present

## 2024-02-23 DIAGNOSIS — E1122 Type 2 diabetes mellitus with diabetic chronic kidney disease: Secondary | ICD-10-CM | POA: Diagnosis not present

## 2024-02-23 DIAGNOSIS — Z7901 Long term (current) use of anticoagulants: Secondary | ICD-10-CM | POA: Diagnosis not present

## 2024-02-23 DIAGNOSIS — I5032 Chronic diastolic (congestive) heart failure: Secondary | ICD-10-CM | POA: Diagnosis not present

## 2024-02-23 DIAGNOSIS — J9611 Chronic respiratory failure with hypoxia: Secondary | ICD-10-CM | POA: Diagnosis not present

## 2024-02-23 DIAGNOSIS — I480 Paroxysmal atrial fibrillation: Secondary | ICD-10-CM | POA: Diagnosis not present

## 2024-02-23 DIAGNOSIS — I130 Hypertensive heart and chronic kidney disease with heart failure and stage 1 through stage 4 chronic kidney disease, or unspecified chronic kidney disease: Secondary | ICD-10-CM | POA: Diagnosis not present

## 2024-02-23 DIAGNOSIS — Z951 Presence of aortocoronary bypass graft: Secondary | ICD-10-CM | POA: Diagnosis not present

## 2024-02-23 DIAGNOSIS — E782 Mixed hyperlipidemia: Secondary | ICD-10-CM | POA: Diagnosis not present

## 2024-02-23 DIAGNOSIS — Z48812 Encounter for surgical aftercare following surgery on the circulatory system: Secondary | ICD-10-CM | POA: Diagnosis not present

## 2024-02-23 DIAGNOSIS — N179 Acute kidney failure, unspecified: Secondary | ICD-10-CM | POA: Diagnosis not present

## 2024-02-23 DIAGNOSIS — Z952 Presence of prosthetic heart valve: Secondary | ICD-10-CM | POA: Diagnosis not present

## 2024-02-23 DIAGNOSIS — Z950 Presence of cardiac pacemaker: Secondary | ICD-10-CM | POA: Diagnosis not present

## 2024-02-23 DIAGNOSIS — Z955 Presence of coronary angioplasty implant and graft: Secondary | ICD-10-CM | POA: Diagnosis not present

## 2024-02-23 DIAGNOSIS — I495 Sick sinus syndrome: Secondary | ICD-10-CM | POA: Diagnosis not present

## 2024-02-23 DIAGNOSIS — K5900 Constipation, unspecified: Secondary | ICD-10-CM | POA: Diagnosis not present

## 2024-02-23 DIAGNOSIS — I2510 Atherosclerotic heart disease of native coronary artery without angina pectoris: Secondary | ICD-10-CM | POA: Diagnosis not present

## 2024-02-23 DIAGNOSIS — N1832 Chronic kidney disease, stage 3b: Secondary | ICD-10-CM | POA: Diagnosis not present

## 2024-02-23 DIAGNOSIS — I951 Orthostatic hypotension: Secondary | ICD-10-CM | POA: Diagnosis not present

## 2024-02-24 ENCOUNTER — Other Ambulatory Visit: Payer: Self-pay | Admitting: Family

## 2024-02-26 ENCOUNTER — Ambulatory Visit: Admitting: Primary Care

## 2024-02-26 ENCOUNTER — Telehealth: Payer: Self-pay | Admitting: *Deleted

## 2024-02-26 NOTE — Telephone Encounter (Signed)
 Copied from CRM [id number]. Topic: Clinical - Home Health Verbal Orders >> Feb 26, 2024 11:19 AM Carmell SAUNDERS wrote: Caller/Agency: Sharlet Cleaves, Rn from [facility] Callback Number: [phone] Service Requested: Skilled Nursing Frequency: 1w3, 56m1, 2prn Any new concerns about the patient? No >> Feb 26, 2024 11:22 AM Carmell R wrote: Adding that start of care was Friday 02/23/2024 for heart failure

## 2024-02-26 NOTE — Telephone Encounter (Signed)
 Is it ok to give Verbal orders for Skilled Nursing Home Health?  Patient has a Rehab Discharge follow up appointment scheduled for 8/4.  Please Advise.

## 2024-02-26 NOTE — Telephone Encounter (Signed)
 Yes, may have  home health.

## 2024-02-26 NOTE — Telephone Encounter (Signed)
 Sharlet with [facility] Notified and agreed.

## 2024-02-27 DIAGNOSIS — I495 Sick sinus syndrome: Secondary | ICD-10-CM | POA: Diagnosis not present

## 2024-02-29 ENCOUNTER — Encounter (HOSPITAL_COMMUNITY): Payer: Self-pay

## 2024-02-29 ENCOUNTER — Telehealth: Payer: Self-pay

## 2024-02-29 NOTE — Telephone Encounter (Signed)
-   Thyroid  ultrasound done on 03/23/2024 close ordered by nephrology.  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  He was referred to endocrinology and has an appointment on 05/01/2024.

## 2024-02-29 NOTE — Telephone Encounter (Signed)
 Copied from CRM [id number]. Topic: General - Other >> Feb 29, 2024  1:15 PM Miquel SAILOR wrote: Reason for CRM: Patient calling on info on Thyroid  PCP. Also on HOS Follow up. Added notes for visit on 08/04. Tranfers to office

## 2024-02-29 NOTE — Telephone Encounter (Signed)
 Not a patient with our office  Forwarding message to listed PCP  Johnny [name] delude, Johnny [name]

## 2024-03-01 NOTE — Telephone Encounter (Signed)
 MyChart message sent to patient with results from PCP Medina-Vargas, Monina C, NP . Patient also has upcoming appointmnt 03/04/2024. Results can be further discussed during this time.

## 2024-03-02 ENCOUNTER — Encounter

## 2024-03-04 ENCOUNTER — Telehealth: Payer: Self-pay | Admitting: *Deleted

## 2024-03-04 ENCOUNTER — Encounter: Admitting: Adult Health

## 2024-03-04 NOTE — Progress Notes (Signed)
 This encounter was created in error - please disregard.

## 2024-03-04 NOTE — Telephone Encounter (Signed)
 Copied from CRM [id number]. Topic: Clinical - Home Health Verbal Orders >> Mar 04, 2024 10:36 AM Mercer PEDLAR wrote: Caller/Agency: Tanner from [facility] Callback Number: [phone] Service Requested: Physical Therapy Frequency: 1 week 7 Any new concerns about the patient? Yes After Heart surgery to improve endurance.

## 2024-03-04 NOTE — Telephone Encounter (Signed)
 Verbal orders given to Tanner with [facility].

## 2024-03-06 DIAGNOSIS — Z952 Presence of prosthetic heart valve: Secondary | ICD-10-CM | POA: Diagnosis not present

## 2024-03-06 DIAGNOSIS — I2510 Atherosclerotic heart disease of native coronary artery without angina pectoris: Secondary | ICD-10-CM | POA: Diagnosis not present

## 2024-03-06 DIAGNOSIS — I951 Orthostatic hypotension: Secondary | ICD-10-CM | POA: Diagnosis not present

## 2024-03-06 DIAGNOSIS — E1122 Type 2 diabetes mellitus with diabetic chronic kidney disease: Secondary | ICD-10-CM | POA: Diagnosis not present

## 2024-03-06 DIAGNOSIS — I480 Paroxysmal atrial fibrillation: Secondary | ICD-10-CM | POA: Diagnosis not present

## 2024-03-06 DIAGNOSIS — Z48812 Encounter for surgical aftercare following surgery on the circulatory system: Secondary | ICD-10-CM | POA: Diagnosis not present

## 2024-03-06 DIAGNOSIS — Z950 Presence of cardiac pacemaker: Secondary | ICD-10-CM | POA: Diagnosis not present

## 2024-03-06 DIAGNOSIS — I5032 Chronic diastolic (congestive) heart failure: Secondary | ICD-10-CM | POA: Diagnosis not present

## 2024-03-06 DIAGNOSIS — I495 Sick sinus syndrome: Secondary | ICD-10-CM | POA: Diagnosis not present

## 2024-03-06 DIAGNOSIS — Z955 Presence of coronary angioplasty implant and graft: Secondary | ICD-10-CM | POA: Diagnosis not present

## 2024-03-06 DIAGNOSIS — Z951 Presence of aortocoronary bypass graft: Secondary | ICD-10-CM | POA: Diagnosis not present

## 2024-03-06 DIAGNOSIS — Z9181 History of falling: Secondary | ICD-10-CM | POA: Diagnosis not present

## 2024-03-06 DIAGNOSIS — K5900 Constipation, unspecified: Secondary | ICD-10-CM | POA: Diagnosis not present

## 2024-03-06 DIAGNOSIS — E1151 Type 2 diabetes mellitus with diabetic peripheral angiopathy without gangrene: Secondary | ICD-10-CM | POA: Diagnosis not present

## 2024-03-06 DIAGNOSIS — Z7901 Long term (current) use of anticoagulants: Secondary | ICD-10-CM | POA: Diagnosis not present

## 2024-03-06 DIAGNOSIS — E782 Mixed hyperlipidemia: Secondary | ICD-10-CM | POA: Diagnosis not present

## 2024-03-06 DIAGNOSIS — I130 Hypertensive heart and chronic kidney disease with heart failure and stage 1 through stage 4 chronic kidney disease, or unspecified chronic kidney disease: Secondary | ICD-10-CM | POA: Diagnosis not present

## 2024-03-06 DIAGNOSIS — N179 Acute kidney failure, unspecified: Secondary | ICD-10-CM | POA: Diagnosis not present

## 2024-03-06 DIAGNOSIS — J9611 Chronic respiratory failure with hypoxia: Secondary | ICD-10-CM | POA: Diagnosis not present

## 2024-03-06 DIAGNOSIS — G4733 Obstructive sleep apnea (adult) (pediatric): Secondary | ICD-10-CM | POA: Diagnosis not present

## 2024-03-06 DIAGNOSIS — N1832 Chronic kidney disease, stage 3b: Secondary | ICD-10-CM | POA: Diagnosis not present

## 2024-03-07 ENCOUNTER — Telehealth: Payer: Self-pay

## 2024-03-07 NOTE — Telephone Encounter (Signed)
 Call returned to [facility] and verbal orders authorized per [employer] standing order

## 2024-03-07 NOTE — Telephone Encounter (Signed)
 Copied from CRM [id number]. Topic: Clinical - Home Health Verbal Orders >> Mar 07, 2024  9:32 AM Farrel B wrote: Caller/Agency: Signe Edison of [facility] Callback Number: [phone] Service Requested: Occupational Therapy Frequency: One time a week for 5 weeks Any new concerns about the patient? No   Mr. Edison stated he went to visit the patient on yesterday and was informed that his PCP NP Estelle was no longer with [facility].

## 2024-03-09 DIAGNOSIS — G4733 Obstructive sleep apnea (adult) (pediatric): Secondary | ICD-10-CM | POA: Diagnosis not present

## 2024-03-09 DIAGNOSIS — J449 Chronic obstructive pulmonary disease, unspecified: Secondary | ICD-10-CM | POA: Diagnosis not present

## 2024-03-11 ENCOUNTER — Encounter (HOSPITAL_COMMUNITY): Payer: Self-pay | Admitting: Surgery

## 2024-03-12 ENCOUNTER — Telehealth: Payer: Self-pay | Admitting: Cardiovascular Disease

## 2024-03-12 ENCOUNTER — Encounter: Payer: Self-pay | Admitting: Internal Medicine

## 2024-03-12 DIAGNOSIS — G4733 Obstructive sleep apnea (adult) (pediatric): Secondary | ICD-10-CM | POA: Diagnosis not present

## 2024-03-12 NOTE — Telephone Encounter (Signed)
 error

## 2024-03-12 NOTE — Telephone Encounter (Signed)
 Pt c/o Shortness Of Breath: STAT if SOB developed within the last 24 hours or pt is noticeably SOB on the phone  1. Are you currently SOB (can you hear that pt is SOB on the phone)? no  2. How long have you been experiencing SOB? 8/1  3. Are you SOB when sitting or when up moving around? both  4. Are you currently experiencing any other symptoms? Indigestion really bad, can hardly walk because of the sob

## 2024-03-12 NOTE — Telephone Encounter (Signed)
 Transmission received.  Pt scheduled for remote checks.  Message sent to scheduling.  Pt overdue for follow up with Dr. JAYSON.

## 2024-03-12 NOTE — Telephone Encounter (Signed)
 Left message for patient to call back

## 2024-03-12 NOTE — Progress Notes (Unsigned)
 [facility]   MULTIDISCIPLINARY HEART VALVE CLINIC                                     Cardiology Office Note:    Date:  03/19/2024   ID:  Johnny [name], DOB [birth year], MRN [id number]  PCP:  Johnny Jereld BROCKS, NP  [facility] Cardiologist:  Johnny LITTIE Nanas, MD  [facility] Structural heart: Ozell Fell, MD [facility] Electrophysiologist:  None   Referring MD: Johnny [name]*   1 month s/p TAVR  History of Present Illness:    Johnny [name] is a 78 y.o. 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   He developed worsening exertional shortness of breath and fatigue as well as frequent episodes of dizziness with position changes. His most recent echocardiogram on 11/06/2023 showed EF 60% and severe AS with mean grad 32 mmHg, AVA 0.81 cm2, SVI 31 and mild AI. L/RHC showed patent left circumflex and right coronary stents. The vein graft to the obtuse marginal 1 and 2 was known to be occluded. The LIMA to the LAD is known to be a atretic and was not imaged. There was competitive flow seen in the distal LAD. The small LIMA to the LAD appears patent. There was an occluded saphenous vein graft to the obtuse marginal branches. S/p successful TAVR with a 29 mm Medtronic FX + THV via the TF approach on 01/30/24. Post operative echo showed EF 60%, mod concentric LVH, normally functioning TAVR with a mean gradient of 10 mmHg and no PVL as well as mild MR. Hospital course prolonged by poor baseline functional status with poor mobility, acute CHF, hiccups, orthostatic hypotension AKI, sedation and constipation. He was discharged to a SNF on 02/07/24. He is currently at [facility]. At follow up he complained of exertional leg pain and set up for ABIs were normal. He was discharged home on  02/19/24.   Today the patient presents to clinic for follow up. Feeling more SOB. Has had burping but no more hiccups. He was walking there and able to walk around with no issues. Now he is having more SOB. Occasional chest pain not related to exertion. No LE edema, orthpnea or PND. Dizzy every day but no syncope. BP has been running low at home.     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 Fell and Dr. Lucas   Symptomatic bradycardia 04/2021   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[place], [place]     Current Medications: Current Meds  Medication Sig   albuterol  (VENTOLIN  HFA) 108 (90 Base) MCG/ACT inhaler Inhale 2 puffs into the lungs every 6 (six) hours as needed for wheezing or shortness of breath.   amiodarone  (PACERONE ) 200 MG tablet TAKE 1 TABLET BY MOUTH DAILY   amoxicillin  (AMOXIL ) 500 MG tablet Take 4 tablets (2,000 mg total) by mouth as directed. 1 hour prior to dental work including cleanings   apixaban  (ELIQUIS ) 5 MG  TABS tablet TAKE 1 TABLET BY MOUTH TWICE  DAILY   carvedilol  (COREG ) 6.25 MG tablet TAKE 1 TABLET BY MOUTH TWICE  DAILY   cyanocobalamin  (VITAMIN B12) 1000 MCG tablet Take 1 tablet (1,000 mcg total) by mouth daily.   famotidine  (PEPCID ) 40 MG tablet Take 40 mg by mouth daily.   fluticasone  (FLONASE ) 50 MCG/ACT nasal spray Place 2 sprays into both nostrils daily.   furosemide  (LASIX ) 40 MG tablet Take 1 tablet (40 mg total) by mouth daily.   glucose blood test strip 1 each by Other route daily. [employer] Glucometer   isosorbide  mononitrate (IMDUR ) 30 MG 24 hr tablet TAKE 1 TABLET BY MOUTH DAILY   loratadine  (CLARITIN ) 10 MG tablet Take 1 tablet (10 mg total) by mouth daily.   mirtazapine  (REMERON  SOL-TAB) 30 MG disintegrating tablet Take 1 tablet (30 mg total) by mouth at bedtime.    montelukast  (SINGULAIR ) 10 MG tablet Take 10 mg by mouth daily.   nitroGLYCERIN  (NITROSTAT ) 0.4 MG SL tablet DISSOLVE 1 TABLET UNDER THE  TONGUE EVERY 5 MINUTES AS NEEDED FOR CHEST PAIN. MAX OF 3 TABLETS IN 15 MINUTES. CALL 911 IF PAIN  PERSISTS.   pantoprazole  (PROTONIX ) 40 MG tablet TAKE 1 TABLET BY MOUTH DAILY  BEFORE BREAKFAST   ranolazine  (RANEXA ) 500 MG 12 hr tablet Take 1 tablet (500 mg total) by mouth 2 (two) times daily.   rosuvastatin  (CRESTOR ) 40 MG tablet TAKE 1 TABLET BY MOUTH DAILY   venlafaxine  XR (EFFEXOR -XR) 150 MG 24 hr capsule TAKE 1 CAPSULE BY MOUTH DAILY  WITH BREAKFAST      ROS:   Please see the history of present illness.    All other systems reviewed and are negative.  EKGs       Risk Assessment/Calculations:           Physical Exam:    VS:  BP 120/82   Pulse 69   Ht 5' 6.5 (1.689 m)   Wt 198 lb 3.2 oz (89.9 kg)   SpO2 94%   BMI 31.51 kg/m     Wt Readings from Last 3 Encounters:  03/18/24 198 lb 3.2 oz (89.9 kg)  02/12/24 217 lb (98.4 kg)  02/07/24 214 lb 1.6 oz (97.1 kg)     GEN: Well nourished, well developed in no acute distress, obese  NECK: No JVD CARDIAC: RRR, no murmurs, rubs, gallops RESPIRATORY:  Clear to auscultation without rales, wheezing or rhonchi  ABDOMEN: Soft, non-tender, non-distended EXTREMITIES:  No edema; No deformity.   ASSESSMENT:    1. S/P TAVR (transcatheter aortic valve replacement)   2. Chronic heart failure with preserved ejection fraction (HCC)   3. CKD stage 3 due to type 2 diabetes mellitus (HCC)   4. Essential hypertension   5. Hx of CABG   6. Paroxysmal atrial fibrillation (HCC)   7. Hiccups   8. Pain in both lower extremities      PLAN:    In order of problems listed above:  Severe AS s/p TAVR:  -- Echo today shows EF 50-55%, mod concentric LVH, moderate MAC, normally functioning TAVR with a mean gradient of 6 mm hg and no PVL (image quality poor).  -- NYHA class III.  -- SBE prophylaxis  discussed; I have RX'd amoxicillin .  -- Continue Eliquis  5mg  BID.  -- I will see back for 1 year echo and OV.  HFpEF: -- Appears euvolemic but with worsening symptoms of SOB.  -- Weight is down 19 lbs and he  has no LE edema, crackles on lung exam or JVD. -- Will check CMET, CBC, TSH, BNP.  If these are normal, I suspect deconditioning and would encourage CRP phase II.  -- Continue Lasix  40mg     CKD stage IIIb: -- Creat baseline 1.39-1.8. -- Labs today.  HTN:  -- BP well controlled.  -- Continue Coreg  6.25mg  BID, Imdur  30mg  daily and Lasix  40 mg daily. -- Has had some dizziness.    CAD s/p CABG: -- L/RHC showed patent left circumflex and right coronary stents. The vein graft to the obtuse marginal 1 and 2 was known to be occluded. The LIMA to the LAD is known to be a atretic and was not imaged. There was competitive flow seen in the distal LAD. The small LIMA to the LAD appears patent. There was an occluded saphenous vein graft to the obtuse marginal branches. -- Continue medical therapy.    PAF: -- Continue Eliquis  5mg  BID.  -- Continue Coreg  6.25mg  BID.  -- Continue amiodarone  200 mg daily.    Hiccups: -- Self resolved after 17 days.  -- Now having belching.   Exertional LE pain:  -- LE arterial duplex 03/14/24 were normal.     Cardiac Rehabilitation Eligibility Assessment  The patient is ready to start cardiac rehabilitation from a cardiac standpoint.     Medication Adjustments/Labs and Tests Ordered: Current medicines are reviewed at length with the patient today.  Concerns regarding medicines are outlined above.  Orders Placed This Encounter  Procedures   Comp Met (CMET)   CBC w/Diff   TSH   B Nat Peptide   ECHOCARDIOGRAM COMPLETE   No orders of the defined types were placed in this encounter.   Patient Instructions  Medication Instructions:  Your physician recommends that you continue on your current medications as directed. Please refer to the Current  Medication list given to you today.  *If you need a refill on your cardiac medications before your next appointment, please call your pharmacy*  Lab Work: TODAY/TOMMORROW CMET, CBC, TSH, and BNP If you have labs (blood work) drawn today and your tests are completely normal, you will receive your results only by: MyChart Message (if you have MyChart) OR A paper copy in the mail If you have any lab test that is abnormal or we need to change your treatment, we will call you to review the results.  Testing/Procedures: 01/2025 Your physician has requested that you have an echocardiogram. Echocardiography is a painless test that uses sound waves to create images of your heart. It provides your doctor with information about the size and shape of your heart and how well your heart's chambers and valves are working. This procedure takes approximately one hour. There are no restrictions for this procedure. Please do NOT wear cologne, perfume, aftershave, or lotions (deodorant is allowed). Please arrive 15 minutes prior to your appointment time.  Please note: We ask at that you not bring children with you during ultrasound (echo/ vascular) testing. Due to room size and safety concerns, children are not allowed in the ultrasound rooms during exams. Our front office staff cannot provide observation of children in our lobby area while testing is being conducted. An adult accompanying a patient to their appointment will only be allowed in the ultrasound room at the discretion of the ultrasound technician under special circumstances. We apologize for any inconvenience.   Follow-Up: At [facility], you and your health needs are our priority.  As part of our continuing mission  to provide you with exceptional heart care, our providers are all part of one team.  This team includes your primary Cardiologist (physician) and Advanced Practice Providers or APPs (Physician Assistants and Nurse Practitioners) who  all work together to provide you with the care you need, when you need it.  Your next appointment:   As scheduled  Provider:   Izetta Hummer, PA-C    We recommend signing up for the patient portal called MyChart.  Sign up information is provided on this After Visit Summary.  MyChart is used to connect with patients for Virtual Visits (Telemedicine).  Patients are able to view lab/test results, encounter notes, upcoming appointments, etc.  Non-urgent messages can be sent to your provider as well.   To learn more about what you can do with MyChart, go to [email or web address].     Signed, Lamarr Hummer, PA-C  03/19/2024 9:22 AM    [facility]

## 2024-03-13 DIAGNOSIS — J449 Chronic obstructive pulmonary disease, unspecified: Secondary | ICD-10-CM | POA: Diagnosis not present

## 2024-03-13 DIAGNOSIS — G4733 Obstructive sleep apnea (adult) (pediatric): Secondary | ICD-10-CM | POA: Diagnosis not present

## 2024-03-14 ENCOUNTER — Ambulatory Visit (HOSPITAL_COMMUNITY)
Admission: RE | Admit: 2024-03-14 | Discharge: 2024-03-14 | Disposition: A | Discharge status: Home / Self Care | Source: Ambulatory Visit | Attending: Physician Assistant | Admitting: Physician Assistant

## 2024-03-14 DIAGNOSIS — M79604 Pain in right leg: Secondary | ICD-10-CM | POA: Diagnosis not present

## 2024-03-14 DIAGNOSIS — M79605 Pain in left leg: Secondary | ICD-10-CM | POA: Diagnosis not present

## 2024-03-15 DIAGNOSIS — Z7901 Long term (current) use of anticoagulants: Secondary | ICD-10-CM | POA: Diagnosis not present

## 2024-03-15 DIAGNOSIS — I2510 Atherosclerotic heart disease of native coronary artery without angina pectoris: Secondary | ICD-10-CM | POA: Diagnosis not present

## 2024-03-15 DIAGNOSIS — N179 Acute kidney failure, unspecified: Secondary | ICD-10-CM | POA: Diagnosis not present

## 2024-03-15 DIAGNOSIS — E1122 Type 2 diabetes mellitus with diabetic chronic kidney disease: Secondary | ICD-10-CM | POA: Diagnosis not present

## 2024-03-15 DIAGNOSIS — J9611 Chronic respiratory failure with hypoxia: Secondary | ICD-10-CM | POA: Diagnosis not present

## 2024-03-15 DIAGNOSIS — Z952 Presence of prosthetic heart valve: Secondary | ICD-10-CM | POA: Diagnosis not present

## 2024-03-15 DIAGNOSIS — I951 Orthostatic hypotension: Secondary | ICD-10-CM | POA: Diagnosis not present

## 2024-03-15 DIAGNOSIS — Z48812 Encounter for surgical aftercare following surgery on the circulatory system: Secondary | ICD-10-CM | POA: Diagnosis not present

## 2024-03-15 DIAGNOSIS — Z9181 History of falling: Secondary | ICD-10-CM | POA: Diagnosis not present

## 2024-03-15 DIAGNOSIS — Z950 Presence of cardiac pacemaker: Secondary | ICD-10-CM | POA: Diagnosis not present

## 2024-03-15 DIAGNOSIS — G4733 Obstructive sleep apnea (adult) (pediatric): Secondary | ICD-10-CM | POA: Diagnosis not present

## 2024-03-15 DIAGNOSIS — I130 Hypertensive heart and chronic kidney disease with heart failure and stage 1 through stage 4 chronic kidney disease, or unspecified chronic kidney disease: Secondary | ICD-10-CM | POA: Diagnosis not present

## 2024-03-15 DIAGNOSIS — E782 Mixed hyperlipidemia: Secondary | ICD-10-CM | POA: Diagnosis not present

## 2024-03-15 DIAGNOSIS — K5900 Constipation, unspecified: Secondary | ICD-10-CM | POA: Diagnosis not present

## 2024-03-15 DIAGNOSIS — I5032 Chronic diastolic (congestive) heart failure: Secondary | ICD-10-CM | POA: Diagnosis not present

## 2024-03-16 LAB — VAS US ABI WITH/WO TBI
Left ABI: 1.22
Right ABI: 1.07

## 2024-03-18 ENCOUNTER — Ambulatory Visit: Payer: Self-pay | Admitting: Physician Assistant

## 2024-03-18 ENCOUNTER — Ambulatory Visit (INDEPENDENT_AMBULATORY_CARE_PROVIDER_SITE_OTHER): Admitting: Physician Assistant

## 2024-03-18 ENCOUNTER — Ambulatory Visit (HOSPITAL_COMMUNITY)
Admission: RE | Admit: 2024-03-18 | Discharge: 2024-03-18 | Disposition: A | Discharge status: Home / Self Care | Source: Ambulatory Visit | Attending: Cardiovascular Disease | Admitting: Cardiovascular Disease

## 2024-03-18 VITALS — BP 120/82 | HR 69 | Ht 66.5 in | Wt 198.2 lb

## 2024-03-18 DIAGNOSIS — M79605 Pain in left leg: Secondary | ICD-10-CM

## 2024-03-18 DIAGNOSIS — J9611 Chronic respiratory failure with hypoxia: Secondary | ICD-10-CM | POA: Diagnosis not present

## 2024-03-18 DIAGNOSIS — I10 Essential (primary) hypertension: Secondary | ICD-10-CM | POA: Diagnosis not present

## 2024-03-18 DIAGNOSIS — Z952 Presence of prosthetic heart valve: Secondary | ICD-10-CM

## 2024-03-18 DIAGNOSIS — I480 Paroxysmal atrial fibrillation: Secondary | ICD-10-CM

## 2024-03-18 DIAGNOSIS — Z48812 Encounter for surgical aftercare following surgery on the circulatory system: Secondary | ICD-10-CM | POA: Diagnosis not present

## 2024-03-18 DIAGNOSIS — R066 Hiccough: Secondary | ICD-10-CM

## 2024-03-18 DIAGNOSIS — K5900 Constipation, unspecified: Secondary | ICD-10-CM | POA: Diagnosis not present

## 2024-03-18 DIAGNOSIS — N183 Chronic kidney disease, stage 3 unspecified: Secondary | ICD-10-CM

## 2024-03-18 DIAGNOSIS — M79604 Pain in right leg: Secondary | ICD-10-CM | POA: Insufficient documentation

## 2024-03-18 DIAGNOSIS — I5032 Chronic diastolic (congestive) heart failure: Secondary | ICD-10-CM

## 2024-03-18 DIAGNOSIS — Z9181 History of falling: Secondary | ICD-10-CM | POA: Diagnosis not present

## 2024-03-18 DIAGNOSIS — Z955 Presence of coronary angioplasty implant and graft: Secondary | ICD-10-CM | POA: Diagnosis not present

## 2024-03-18 DIAGNOSIS — E1151 Type 2 diabetes mellitus with diabetic peripheral angiopathy without gangrene: Secondary | ICD-10-CM | POA: Diagnosis not present

## 2024-03-18 DIAGNOSIS — E1122 Type 2 diabetes mellitus with diabetic chronic kidney disease: Secondary | ICD-10-CM | POA: Diagnosis not present

## 2024-03-18 DIAGNOSIS — E782 Mixed hyperlipidemia: Secondary | ICD-10-CM | POA: Diagnosis not present

## 2024-03-18 DIAGNOSIS — I130 Hypertensive heart and chronic kidney disease with heart failure and stage 1 through stage 4 chronic kidney disease, or unspecified chronic kidney disease: Secondary | ICD-10-CM | POA: Diagnosis not present

## 2024-03-18 DIAGNOSIS — Z951 Presence of aortocoronary bypass graft: Secondary | ICD-10-CM | POA: Diagnosis not present

## 2024-03-18 DIAGNOSIS — Z7901 Long term (current) use of anticoagulants: Secondary | ICD-10-CM | POA: Diagnosis not present

## 2024-03-18 DIAGNOSIS — N1832 Chronic kidney disease, stage 3b: Secondary | ICD-10-CM | POA: Diagnosis not present

## 2024-03-18 DIAGNOSIS — N179 Acute kidney failure, unspecified: Secondary | ICD-10-CM | POA: Diagnosis not present

## 2024-03-18 DIAGNOSIS — I951 Orthostatic hypotension: Secondary | ICD-10-CM | POA: Diagnosis not present

## 2024-03-18 DIAGNOSIS — Z950 Presence of cardiac pacemaker: Secondary | ICD-10-CM | POA: Diagnosis not present

## 2024-03-18 DIAGNOSIS — G4733 Obstructive sleep apnea (adult) (pediatric): Secondary | ICD-10-CM | POA: Diagnosis not present

## 2024-03-18 DIAGNOSIS — I495 Sick sinus syndrome: Secondary | ICD-10-CM | POA: Diagnosis not present

## 2024-03-18 DIAGNOSIS — I2510 Atherosclerotic heart disease of native coronary artery without angina pectoris: Secondary | ICD-10-CM | POA: Diagnosis not present

## 2024-03-18 LAB — ECHOCARDIOGRAM COMPLETE
AR max vel: 1.77 cm2
AV Area VTI: 1.69 cm2
AV Area mean vel: 1.99 cm2
AV Mean grad: 6 mmHg
AV Peak grad: 12.8 mmHg
Ao pk vel: 1.79 m/s
Area-P 1/2: 3.76 cm2
S' Lateral: 2.1 cm

## 2024-03-18 NOTE — Patient Instructions (Addendum)
 Medication Instructions:  Your physician recommends that you continue on your current medications as directed. Please refer to the Current Medication list given to you today.  *If you need a refill on your cardiac medications before your next appointment, please call your pharmacy*  Lab Work: TODAY/TOMMORROW CMET, CBC, TSH, and BNP If you have labs (blood work) drawn today and your tests are completely normal, you will receive your results only by: MyChart Message (if you have MyChart) OR A paper copy in the mail If you have any lab test that is abnormal or we need to change your treatment, we will call you to review the results.  Testing/Procedures: 01/2025 Your physician has requested that you have an echocardiogram. Echocardiography is a painless test that uses sound waves to create images of your heart. It provides your doctor with information about the size and shape of your heart and how well your heart's chambers and valves are working. This procedure takes approximately one hour. There are no restrictions for this procedure. Please do NOT wear cologne, perfume, aftershave, or lotions (deodorant is allowed). Please arrive 15 minutes prior to your appointment time.  Please note: We ask at that you not bring children with you during ultrasound (echo/ vascular) testing. Due to room size and safety concerns, children are not allowed in the ultrasound rooms during exams. Our front office staff cannot provide observation of children in our lobby area while testing is being conducted. An adult accompanying a patient to their appointment will only be allowed in the ultrasound room at the discretion of the ultrasound technician under special circumstances. We apologize for any inconvenience.   Follow-Up: At [facility], you and your health needs are our priority.  As part of our continuing mission to provide you with exceptional heart care, our providers are all part of one team.  This team  includes your primary Cardiologist (physician) and Advanced Practice Providers or APPs (Physician Assistants and Nurse Practitioners) who all work together to provide you with the care you need, when you need it.  Your next appointment:   As scheduled  Provider:   Izetta Hummer, PA-C    We recommend signing up for the patient portal called MyChart.  Sign up information is provided on this After Visit Summary.  MyChart is used to connect with patients for Virtual Visits (Telemedicine).  Patients are able to view lab/test results, encounter notes, upcoming appointments, etc.  Non-urgent messages can be sent to your provider as well.   To learn more about what you can do with MyChart, go to [email or web address].

## 2024-03-18 NOTE — Telephone Encounter (Signed)
 Patient has a follow up appointment 03/18/24

## 2024-03-19 ENCOUNTER — Ambulatory Visit: Payer: Self-pay | Admitting: Physician Assistant

## 2024-03-19 DIAGNOSIS — Z951 Presence of aortocoronary bypass graft: Secondary | ICD-10-CM | POA: Diagnosis not present

## 2024-03-19 DIAGNOSIS — Z950 Presence of cardiac pacemaker: Secondary | ICD-10-CM | POA: Diagnosis not present

## 2024-03-19 DIAGNOSIS — Z7901 Long term (current) use of anticoagulants: Secondary | ICD-10-CM | POA: Diagnosis not present

## 2024-03-19 DIAGNOSIS — Z48812 Encounter for surgical aftercare following surgery on the circulatory system: Secondary | ICD-10-CM | POA: Diagnosis not present

## 2024-03-19 DIAGNOSIS — Z9181 History of falling: Secondary | ICD-10-CM | POA: Diagnosis not present

## 2024-03-19 DIAGNOSIS — N179 Acute kidney failure, unspecified: Secondary | ICD-10-CM | POA: Diagnosis not present

## 2024-03-19 DIAGNOSIS — K5900 Constipation, unspecified: Secondary | ICD-10-CM | POA: Diagnosis not present

## 2024-03-19 DIAGNOSIS — I951 Orthostatic hypotension: Secondary | ICD-10-CM | POA: Diagnosis not present

## 2024-03-19 DIAGNOSIS — I495 Sick sinus syndrome: Secondary | ICD-10-CM | POA: Diagnosis not present

## 2024-03-19 DIAGNOSIS — I5032 Chronic diastolic (congestive) heart failure: Secondary | ICD-10-CM | POA: Diagnosis not present

## 2024-03-19 DIAGNOSIS — I480 Paroxysmal atrial fibrillation: Secondary | ICD-10-CM | POA: Diagnosis not present

## 2024-03-19 DIAGNOSIS — E782 Mixed hyperlipidemia: Secondary | ICD-10-CM | POA: Diagnosis not present

## 2024-03-19 DIAGNOSIS — J9611 Chronic respiratory failure with hypoxia: Secondary | ICD-10-CM | POA: Diagnosis not present

## 2024-03-19 DIAGNOSIS — I130 Hypertensive heart and chronic kidney disease with heart failure and stage 1 through stage 4 chronic kidney disease, or unspecified chronic kidney disease: Secondary | ICD-10-CM | POA: Diagnosis not present

## 2024-03-19 DIAGNOSIS — Z955 Presence of coronary angioplasty implant and graft: Secondary | ICD-10-CM | POA: Diagnosis not present

## 2024-03-19 DIAGNOSIS — I2510 Atherosclerotic heart disease of native coronary artery without angina pectoris: Secondary | ICD-10-CM | POA: Diagnosis not present

## 2024-03-19 DIAGNOSIS — E1122 Type 2 diabetes mellitus with diabetic chronic kidney disease: Secondary | ICD-10-CM | POA: Diagnosis not present

## 2024-03-19 DIAGNOSIS — Z952 Presence of prosthetic heart valve: Secondary | ICD-10-CM | POA: Diagnosis not present

## 2024-03-19 DIAGNOSIS — E1151 Type 2 diabetes mellitus with diabetic peripheral angiopathy without gangrene: Secondary | ICD-10-CM | POA: Diagnosis not present

## 2024-03-19 DIAGNOSIS — N1832 Chronic kidney disease, stage 3b: Secondary | ICD-10-CM | POA: Diagnosis not present

## 2024-03-19 DIAGNOSIS — G4733 Obstructive sleep apnea (adult) (pediatric): Secondary | ICD-10-CM | POA: Diagnosis not present

## 2024-03-23 ENCOUNTER — Other Ambulatory Visit: Payer: Self-pay | Admitting: Adult Health

## 2024-03-25 ENCOUNTER — Other Ambulatory Visit: Payer: Self-pay | Admitting: Adult Health

## 2024-03-25 DIAGNOSIS — N1832 Chronic kidney disease, stage 3b: Secondary | ICD-10-CM | POA: Diagnosis not present

## 2024-03-25 DIAGNOSIS — Z950 Presence of cardiac pacemaker: Secondary | ICD-10-CM | POA: Diagnosis not present

## 2024-03-25 DIAGNOSIS — F321 Major depressive disorder, single episode, moderate: Secondary | ICD-10-CM

## 2024-03-25 DIAGNOSIS — I2510 Atherosclerotic heart disease of native coronary artery without angina pectoris: Secondary | ICD-10-CM | POA: Diagnosis not present

## 2024-03-25 DIAGNOSIS — I5032 Chronic diastolic (congestive) heart failure: Secondary | ICD-10-CM

## 2024-03-25 DIAGNOSIS — E782 Mixed hyperlipidemia: Secondary | ICD-10-CM | POA: Diagnosis not present

## 2024-03-25 DIAGNOSIS — E1122 Type 2 diabetes mellitus with diabetic chronic kidney disease: Secondary | ICD-10-CM | POA: Diagnosis not present

## 2024-03-25 DIAGNOSIS — Z955 Presence of coronary angioplasty implant and graft: Secondary | ICD-10-CM | POA: Diagnosis not present

## 2024-03-25 DIAGNOSIS — N179 Acute kidney failure, unspecified: Secondary | ICD-10-CM | POA: Diagnosis not present

## 2024-03-25 DIAGNOSIS — Z48812 Encounter for surgical aftercare following surgery on the circulatory system: Secondary | ICD-10-CM | POA: Diagnosis not present

## 2024-03-25 DIAGNOSIS — Z951 Presence of aortocoronary bypass graft: Secondary | ICD-10-CM | POA: Diagnosis not present

## 2024-03-25 DIAGNOSIS — Z7901 Long term (current) use of anticoagulants: Secondary | ICD-10-CM | POA: Diagnosis not present

## 2024-03-25 DIAGNOSIS — I495 Sick sinus syndrome: Secondary | ICD-10-CM | POA: Diagnosis not present

## 2024-03-25 DIAGNOSIS — J9611 Chronic respiratory failure with hypoxia: Secondary | ICD-10-CM | POA: Diagnosis not present

## 2024-03-25 DIAGNOSIS — I951 Orthostatic hypotension: Secondary | ICD-10-CM | POA: Diagnosis not present

## 2024-03-25 DIAGNOSIS — Z6835 Body mass index (BMI) 35.0-35.9, adult: Secondary | ICD-10-CM

## 2024-03-25 DIAGNOSIS — E1151 Type 2 diabetes mellitus with diabetic peripheral angiopathy without gangrene: Secondary | ICD-10-CM | POA: Diagnosis not present

## 2024-03-25 DIAGNOSIS — I130 Hypertensive heart and chronic kidney disease with heart failure and stage 1 through stage 4 chronic kidney disease, or unspecified chronic kidney disease: Secondary | ICD-10-CM | POA: Diagnosis not present

## 2024-03-25 DIAGNOSIS — Z556 Problems related to health literacy: Secondary | ICD-10-CM

## 2024-03-25 DIAGNOSIS — I480 Paroxysmal atrial fibrillation: Secondary | ICD-10-CM | POA: Diagnosis not present

## 2024-03-25 DIAGNOSIS — Z952 Presence of prosthetic heart valve: Secondary | ICD-10-CM | POA: Diagnosis not present

## 2024-03-25 DIAGNOSIS — K5900 Constipation, unspecified: Secondary | ICD-10-CM | POA: Diagnosis not present

## 2024-03-25 DIAGNOSIS — Z604 Social exclusion and rejection: Secondary | ICD-10-CM

## 2024-03-25 DIAGNOSIS — G4733 Obstructive sleep apnea (adult) (pediatric): Secondary | ICD-10-CM | POA: Diagnosis not present

## 2024-03-25 DIAGNOSIS — Z9181 History of falling: Secondary | ICD-10-CM | POA: Diagnosis not present

## 2024-03-25 NOTE — Telephone Encounter (Signed)
 Pharmacy requested refill.  Pended Rxs and sent to [facility] for approval due to HIGH ALERT Warning.

## 2024-03-26 ENCOUNTER — Other Ambulatory Visit: Payer: Self-pay | Admitting: Adult Health

## 2024-03-26 DIAGNOSIS — I5032 Chronic diastolic (congestive) heart failure: Secondary | ICD-10-CM

## 2024-03-26 MED ORDER — FUROSEMIDE 40 MG PO TABS
40.0000 mg | ORAL_TABLET | Freq: Every day | ORAL | 1 refills | Status: AC
Start: 2024-03-26 — End: ?

## 2024-03-26 NOTE — Telephone Encounter (Signed)
 Medication hasn't been prescribed by patients pcp

## 2024-03-28 ENCOUNTER — Telehealth: Payer: Self-pay | Admitting: Cardiology

## 2024-03-28 ENCOUNTER — Ambulatory Visit: Payer: Self-pay

## 2024-03-28 NOTE — Telephone Encounter (Signed)
 Noted

## 2024-03-28 NOTE — Telephone Encounter (Signed)
 Pt c/o BP issue: STAT if pt c/o blurred vision, one-sided weakness or slurred speech.  STAT if BP is GREATER than 180/120 TODAY.  STAT if BP is LESS than 90/60 and SYMPTOMATIC TODAY  1. What is your BP concern?   See below  2. Have you taken any BP medication today?  Yes  3. What are your last 5 BP readings?  86/64   4. Are you having any other symptoms (ex. Dizziness, headache, blurred vision, passed out)?   Dizziness on standing, very tired   Caller (Tanner) is concerned patient BP at time of his arrival was 86/64 and patient told him he was well hydrated with no changes in weight.

## 2024-03-28 NOTE — Telephone Encounter (Signed)
 This encounter was created in error - please disregard.

## 2024-03-28 NOTE — Telephone Encounter (Signed)
 Left voice message to call back 8/28

## 2024-03-28 NOTE — Telephone Encounter (Signed)
 Per note below, caller was warm transferred to office and assisted appropriately.   Copied from CRM [id number]. Topic: General - Other >> Mar 28, 2024  2:01 PM Miquel SAILOR wrote: Reason for CRM: Lamar from Physical Therapy/[phone] calling on concerns for PT BP 90/62. Was going to transfer to NT but call dropped. Tried calling back n/a straight VM. Needs call back or Pt [phone] >> Mar 28, 2024  2:07 PM Susanna ORN wrote: Tanner, with [facility], calling to speak with someone in office about patient's vital signs. Called CAL & spoke with Darice and she assisted Tanner. Warm transferred.

## 2024-03-29 DIAGNOSIS — E1122 Type 2 diabetes mellitus with diabetic chronic kidney disease: Secondary | ICD-10-CM | POA: Diagnosis not present

## 2024-04-02 ENCOUNTER — Telehealth: Payer: Self-pay

## 2024-04-02 DIAGNOSIS — J9611 Chronic respiratory failure with hypoxia: Secondary | ICD-10-CM | POA: Diagnosis not present

## 2024-04-02 DIAGNOSIS — Z951 Presence of aortocoronary bypass graft: Secondary | ICD-10-CM | POA: Diagnosis not present

## 2024-04-02 DIAGNOSIS — Z48812 Encounter for surgical aftercare following surgery on the circulatory system: Secondary | ICD-10-CM | POA: Diagnosis not present

## 2024-04-02 DIAGNOSIS — Z955 Presence of coronary angioplasty implant and graft: Secondary | ICD-10-CM | POA: Diagnosis not present

## 2024-04-02 DIAGNOSIS — G4733 Obstructive sleep apnea (adult) (pediatric): Secondary | ICD-10-CM | POA: Diagnosis not present

## 2024-04-02 DIAGNOSIS — I495 Sick sinus syndrome: Secondary | ICD-10-CM | POA: Diagnosis not present

## 2024-04-02 DIAGNOSIS — E1151 Type 2 diabetes mellitus with diabetic peripheral angiopathy without gangrene: Secondary | ICD-10-CM | POA: Diagnosis not present

## 2024-04-02 DIAGNOSIS — K5900 Constipation, unspecified: Secondary | ICD-10-CM | POA: Diagnosis not present

## 2024-04-02 DIAGNOSIS — Z7901 Long term (current) use of anticoagulants: Secondary | ICD-10-CM | POA: Diagnosis not present

## 2024-04-02 DIAGNOSIS — E1122 Type 2 diabetes mellitus with diabetic chronic kidney disease: Secondary | ICD-10-CM | POA: Diagnosis not present

## 2024-04-02 DIAGNOSIS — I951 Orthostatic hypotension: Secondary | ICD-10-CM | POA: Diagnosis not present

## 2024-04-02 DIAGNOSIS — I130 Hypertensive heart and chronic kidney disease with heart failure and stage 1 through stage 4 chronic kidney disease, or unspecified chronic kidney disease: Secondary | ICD-10-CM | POA: Diagnosis not present

## 2024-04-02 DIAGNOSIS — I2510 Atherosclerotic heart disease of native coronary artery without angina pectoris: Secondary | ICD-10-CM | POA: Diagnosis not present

## 2024-04-02 DIAGNOSIS — I5032 Chronic diastolic (congestive) heart failure: Secondary | ICD-10-CM | POA: Diagnosis not present

## 2024-04-02 DIAGNOSIS — Z9181 History of falling: Secondary | ICD-10-CM | POA: Diagnosis not present

## 2024-04-02 DIAGNOSIS — E782 Mixed hyperlipidemia: Secondary | ICD-10-CM | POA: Diagnosis not present

## 2024-04-02 DIAGNOSIS — N179 Acute kidney failure, unspecified: Secondary | ICD-10-CM | POA: Diagnosis not present

## 2024-04-02 DIAGNOSIS — I480 Paroxysmal atrial fibrillation: Secondary | ICD-10-CM | POA: Diagnosis not present

## 2024-04-02 DIAGNOSIS — Z950 Presence of cardiac pacemaker: Secondary | ICD-10-CM | POA: Diagnosis not present

## 2024-04-02 DIAGNOSIS — N1832 Chronic kidney disease, stage 3b: Secondary | ICD-10-CM | POA: Diagnosis not present

## 2024-04-02 DIAGNOSIS — Z952 Presence of prosthetic heart valve: Secondary | ICD-10-CM | POA: Diagnosis not present

## 2024-04-02 NOTE — Telephone Encounter (Signed)
 Copied from CRM [id number]. Topic: Clinical - Home Health Verbal Orders >> Mar 29, 2024  3:36 PM Laurier BROCKS wrote: Caller/Agency: Kristine GLENWOOD Lenny Mitch Number: [phone] Service Requested: Speech Therapy Frequency: 1 week for 6 weeks Any new concerns about the patient? No   Was unable to the leave a detail message for Mrs. Kristine Glatter in regards to given permission to the okay for the Home Health Verbal Orders for Speech Therapy through our [facility] policy. Will need to attempt to make another phone call.

## 2024-04-04 DIAGNOSIS — E782 Mixed hyperlipidemia: Secondary | ICD-10-CM | POA: Diagnosis not present

## 2024-04-04 NOTE — Telephone Encounter (Signed)
 Called patient and  unable to reach patient . left message to call office

## 2024-04-05 ENCOUNTER — Encounter: Payer: Self-pay | Admitting: Emergency Medicine

## 2024-04-08 DIAGNOSIS — J9611 Chronic respiratory failure with hypoxia: Secondary | ICD-10-CM | POA: Diagnosis not present

## 2024-04-08 DIAGNOSIS — E1122 Type 2 diabetes mellitus with diabetic chronic kidney disease: Secondary | ICD-10-CM | POA: Diagnosis not present

## 2024-04-08 DIAGNOSIS — I495 Sick sinus syndrome: Secondary | ICD-10-CM | POA: Diagnosis not present

## 2024-04-08 DIAGNOSIS — I130 Hypertensive heart and chronic kidney disease with heart failure and stage 1 through stage 4 chronic kidney disease, or unspecified chronic kidney disease: Secondary | ICD-10-CM | POA: Diagnosis not present

## 2024-04-08 DIAGNOSIS — Z9181 History of falling: Secondary | ICD-10-CM | POA: Diagnosis not present

## 2024-04-08 DIAGNOSIS — I480 Paroxysmal atrial fibrillation: Secondary | ICD-10-CM | POA: Diagnosis not present

## 2024-04-08 DIAGNOSIS — Z7901 Long term (current) use of anticoagulants: Secondary | ICD-10-CM | POA: Diagnosis not present

## 2024-04-08 DIAGNOSIS — E782 Mixed hyperlipidemia: Secondary | ICD-10-CM | POA: Diagnosis not present

## 2024-04-08 DIAGNOSIS — I5032 Chronic diastolic (congestive) heart failure: Secondary | ICD-10-CM | POA: Diagnosis not present

## 2024-04-08 DIAGNOSIS — I2510 Atherosclerotic heart disease of native coronary artery without angina pectoris: Secondary | ICD-10-CM | POA: Diagnosis not present

## 2024-04-08 DIAGNOSIS — N1832 Chronic kidney disease, stage 3b: Secondary | ICD-10-CM | POA: Diagnosis not present

## 2024-04-08 DIAGNOSIS — I951 Orthostatic hypotension: Secondary | ICD-10-CM | POA: Diagnosis not present

## 2024-04-08 DIAGNOSIS — Z952 Presence of prosthetic heart valve: Secondary | ICD-10-CM | POA: Diagnosis not present

## 2024-04-08 DIAGNOSIS — Z950 Presence of cardiac pacemaker: Secondary | ICD-10-CM | POA: Diagnosis not present

## 2024-04-08 DIAGNOSIS — Z955 Presence of coronary angioplasty implant and graft: Secondary | ICD-10-CM | POA: Diagnosis not present

## 2024-04-08 DIAGNOSIS — N179 Acute kidney failure, unspecified: Secondary | ICD-10-CM | POA: Diagnosis not present

## 2024-04-08 DIAGNOSIS — G4733 Obstructive sleep apnea (adult) (pediatric): Secondary | ICD-10-CM | POA: Diagnosis not present

## 2024-04-08 DIAGNOSIS — Z951 Presence of aortocoronary bypass graft: Secondary | ICD-10-CM | POA: Diagnosis not present

## 2024-04-08 DIAGNOSIS — E1151 Type 2 diabetes mellitus with diabetic peripheral angiopathy without gangrene: Secondary | ICD-10-CM | POA: Diagnosis not present

## 2024-04-08 DIAGNOSIS — K5900 Constipation, unspecified: Secondary | ICD-10-CM | POA: Diagnosis not present

## 2024-04-09 ENCOUNTER — Ambulatory Visit: Admitting: Primary Care

## 2024-04-09 DIAGNOSIS — G4733 Obstructive sleep apnea (adult) (pediatric): Secondary | ICD-10-CM | POA: Diagnosis not present

## 2024-04-09 DIAGNOSIS — J9611 Chronic respiratory failure with hypoxia: Secondary | ICD-10-CM

## 2024-04-13 DIAGNOSIS — J449 Chronic obstructive pulmonary disease, unspecified: Secondary | ICD-10-CM | POA: Diagnosis not present

## 2024-04-13 DIAGNOSIS — G4733 Obstructive sleep apnea (adult) (pediatric): Secondary | ICD-10-CM | POA: Diagnosis not present

## 2024-04-16 DIAGNOSIS — G4733 Obstructive sleep apnea (adult) (pediatric): Secondary | ICD-10-CM | POA: Diagnosis not present

## 2024-04-16 DIAGNOSIS — Z7901 Long term (current) use of anticoagulants: Secondary | ICD-10-CM | POA: Diagnosis not present

## 2024-04-16 DIAGNOSIS — Z955 Presence of coronary angioplasty implant and graft: Secondary | ICD-10-CM | POA: Diagnosis not present

## 2024-04-16 DIAGNOSIS — N179 Acute kidney failure, unspecified: Secondary | ICD-10-CM | POA: Diagnosis not present

## 2024-04-16 DIAGNOSIS — K5900 Constipation, unspecified: Secondary | ICD-10-CM | POA: Diagnosis not present

## 2024-04-16 DIAGNOSIS — E1122 Type 2 diabetes mellitus with diabetic chronic kidney disease: Secondary | ICD-10-CM | POA: Diagnosis not present

## 2024-04-16 DIAGNOSIS — I951 Orthostatic hypotension: Secondary | ICD-10-CM | POA: Diagnosis not present

## 2024-04-16 DIAGNOSIS — N1832 Chronic kidney disease, stage 3b: Secondary | ICD-10-CM | POA: Diagnosis not present

## 2024-04-16 DIAGNOSIS — I2510 Atherosclerotic heart disease of native coronary artery without angina pectoris: Secondary | ICD-10-CM | POA: Diagnosis not present

## 2024-04-16 DIAGNOSIS — I480 Paroxysmal atrial fibrillation: Secondary | ICD-10-CM | POA: Diagnosis not present

## 2024-04-16 DIAGNOSIS — I5032 Chronic diastolic (congestive) heart failure: Secondary | ICD-10-CM | POA: Diagnosis not present

## 2024-04-16 DIAGNOSIS — I495 Sick sinus syndrome: Secondary | ICD-10-CM | POA: Diagnosis not present

## 2024-04-16 DIAGNOSIS — Z952 Presence of prosthetic heart valve: Secondary | ICD-10-CM | POA: Diagnosis not present

## 2024-04-16 DIAGNOSIS — E782 Mixed hyperlipidemia: Secondary | ICD-10-CM | POA: Diagnosis not present

## 2024-04-16 DIAGNOSIS — J9611 Chronic respiratory failure with hypoxia: Secondary | ICD-10-CM | POA: Diagnosis not present

## 2024-04-16 DIAGNOSIS — I130 Hypertensive heart and chronic kidney disease with heart failure and stage 1 through stage 4 chronic kidney disease, or unspecified chronic kidney disease: Secondary | ICD-10-CM | POA: Diagnosis not present

## 2024-04-16 DIAGNOSIS — Z48812 Encounter for surgical aftercare following surgery on the circulatory system: Secondary | ICD-10-CM | POA: Diagnosis not present

## 2024-04-16 DIAGNOSIS — E1151 Type 2 diabetes mellitus with diabetic peripheral angiopathy without gangrene: Secondary | ICD-10-CM | POA: Diagnosis not present

## 2024-04-16 DIAGNOSIS — Z9181 History of falling: Secondary | ICD-10-CM | POA: Diagnosis not present

## 2024-04-16 DIAGNOSIS — Z950 Presence of cardiac pacemaker: Secondary | ICD-10-CM | POA: Diagnosis not present

## 2024-04-16 DIAGNOSIS — Z951 Presence of aortocoronary bypass graft: Secondary | ICD-10-CM | POA: Diagnosis not present

## 2024-04-18 ENCOUNTER — Telehealth: Payer: Self-pay | Admitting: Cardiology

## 2024-04-18 NOTE — Telephone Encounter (Signed)
 Pt c/o BP issue: STAT if pt c/o blurred vision, one-sided weakness or slurred speech.  STAT if BP is GREATER than 180/120 TODAY.  STAT if BP is LESS than 90/60 and SYMPTOMATIC TODAY  1. What is your BP concern? BP not consistent   2. Have you taken any BP medication today? Yes  3. What are your last 5 BP readings? 197/112 97/75  4. Are you having any other symptoms (ex. Dizziness, headache, blurred vision, passed out)? Dizzy

## 2024-04-18 NOTE — Telephone Encounter (Signed)
 I spoke with patient.  He reports BP has been running low.  One high reading today. He reports the following readings- Today at 11 AM was 197/117, at 11:30 AM it was 97/55 Yesterday BP was 75/35 Tuesday BP was 98/65. Patient reports dizziness for last 3 weeks. He is eating and drinking OK. States BP was also low recently  Will forward to Dr Kate for recommendations.

## 2024-04-19 NOTE — Telephone Encounter (Signed)
 He is reporting both very high and very low Bps.  Recommend checking BP twice daily for next week and let us  know results.  Recommend scheduling in pharmacy hypertension clinic and asking him to bring his home monitor to calibrate to appointment

## 2024-04-22 ENCOUNTER — Ambulatory Visit: Admitting: Endocrinology

## 2024-04-22 DIAGNOSIS — E782 Mixed hyperlipidemia: Secondary | ICD-10-CM | POA: Diagnosis not present

## 2024-04-22 DIAGNOSIS — I2510 Atherosclerotic heart disease of native coronary artery without angina pectoris: Secondary | ICD-10-CM | POA: Diagnosis not present

## 2024-04-22 DIAGNOSIS — I480 Paroxysmal atrial fibrillation: Secondary | ICD-10-CM | POA: Diagnosis not present

## 2024-04-22 DIAGNOSIS — N179 Acute kidney failure, unspecified: Secondary | ICD-10-CM | POA: Diagnosis not present

## 2024-04-22 DIAGNOSIS — I130 Hypertensive heart and chronic kidney disease with heart failure and stage 1 through stage 4 chronic kidney disease, or unspecified chronic kidney disease: Secondary | ICD-10-CM | POA: Diagnosis not present

## 2024-04-22 DIAGNOSIS — N1832 Chronic kidney disease, stage 3b: Secondary | ICD-10-CM | POA: Diagnosis not present

## 2024-04-22 DIAGNOSIS — Z9181 History of falling: Secondary | ICD-10-CM | POA: Diagnosis not present

## 2024-04-22 DIAGNOSIS — G4733 Obstructive sleep apnea (adult) (pediatric): Secondary | ICD-10-CM | POA: Diagnosis not present

## 2024-04-22 DIAGNOSIS — Z952 Presence of prosthetic heart valve: Secondary | ICD-10-CM | POA: Diagnosis not present

## 2024-04-22 DIAGNOSIS — I495 Sick sinus syndrome: Secondary | ICD-10-CM | POA: Diagnosis not present

## 2024-04-22 DIAGNOSIS — E1151 Type 2 diabetes mellitus with diabetic peripheral angiopathy without gangrene: Secondary | ICD-10-CM | POA: Diagnosis not present

## 2024-04-22 DIAGNOSIS — K5900 Constipation, unspecified: Secondary | ICD-10-CM | POA: Diagnosis not present

## 2024-04-22 DIAGNOSIS — Z7901 Long term (current) use of anticoagulants: Secondary | ICD-10-CM | POA: Diagnosis not present

## 2024-04-22 DIAGNOSIS — Z955 Presence of coronary angioplasty implant and graft: Secondary | ICD-10-CM | POA: Diagnosis not present

## 2024-04-22 DIAGNOSIS — I951 Orthostatic hypotension: Secondary | ICD-10-CM | POA: Diagnosis not present

## 2024-04-22 DIAGNOSIS — Z950 Presence of cardiac pacemaker: Secondary | ICD-10-CM | POA: Diagnosis not present

## 2024-04-22 DIAGNOSIS — E1122 Type 2 diabetes mellitus with diabetic chronic kidney disease: Secondary | ICD-10-CM | POA: Diagnosis not present

## 2024-04-22 DIAGNOSIS — J9611 Chronic respiratory failure with hypoxia: Secondary | ICD-10-CM | POA: Diagnosis not present

## 2024-04-22 DIAGNOSIS — Z48812 Encounter for surgical aftercare following surgery on the circulatory system: Secondary | ICD-10-CM | POA: Diagnosis not present

## 2024-04-22 DIAGNOSIS — I5032 Chronic diastolic (congestive) heart failure: Secondary | ICD-10-CM | POA: Diagnosis not present

## 2024-04-22 DIAGNOSIS — Z951 Presence of aortocoronary bypass graft: Secondary | ICD-10-CM | POA: Diagnosis not present

## 2024-04-23 ENCOUNTER — Other Ambulatory Visit: Payer: Self-pay | Admitting: *Deleted

## 2024-04-23 DIAGNOSIS — I10 Essential (primary) hypertension: Secondary | ICD-10-CM

## 2024-04-23 NOTE — Progress Notes (Unsigned)
 Order placed for Pharm D per Dr. Kate. Sent pt a mychart message and left note to call office for any questions.

## 2024-04-24 ENCOUNTER — Other Ambulatory Visit: Payer: Self-pay | Admitting: *Deleted

## 2024-04-24 ENCOUNTER — Telehealth: Payer: Self-pay

## 2024-04-24 DIAGNOSIS — I10 Essential (primary) hypertension: Secondary | ICD-10-CM

## 2024-04-24 NOTE — Progress Notes (Signed)
 Made patient aware that Dr. Kate recommended referral for pharmacy D. Made patient aware to bring blood pressure log and blood pressure monitor to that visit to be calibrated. Made patient aware that someone will reach out to him to get that visit scheduled. Understanding verbalized

## 2024-04-24 NOTE — Telephone Encounter (Signed)
 Call returned to [facility] Agency and verbal orders authorized (left detailed message) per [employer] standing order

## 2024-04-24 NOTE — Telephone Encounter (Signed)
 Copied from CRM #[id number]. Topic: Clinical - Home Health Verbal Orders >> Apr 24, 2024  3:25 PM Merlynn A wrote: Caller/Agency: Tanner/[facility] Callback Number: [phone] Service Requested: Physical Therapy Frequency: 1 Week 6 Any new concerns about the patient? No

## 2024-05-01 ENCOUNTER — Ambulatory Visit

## 2024-05-01 ENCOUNTER — Ambulatory Visit: Admitting: "Endocrinology

## 2024-05-01 DIAGNOSIS — I480 Paroxysmal atrial fibrillation: Secondary | ICD-10-CM | POA: Diagnosis not present

## 2024-05-02 LAB — CUP PACEART REMOTE DEVICE CHECK
Battery Remaining Longevity: 150 mo
Battery Remaining Percentage: 100 %
Brady Statistic RA Percent Paced: 94 %
Brady Statistic RV Percent Paced: 3 %
Date Time Interrogation Session: 20251001020300
Lead Channel Impedance Value: 427 Ohm
Lead Channel Impedance Value: 521 Ohm
Lead Channel Pacing Threshold Amplitude: 0.4 V
Lead Channel Pacing Threshold Amplitude: 0.7 V
Lead Channel Pacing Threshold Pulse Width: 0.4 ms
Lead Channel Pacing Threshold Pulse Width: 0.4 ms
Lead Channel Setting Pacing Amplitude: 2 V
Lead Channel Setting Pacing Amplitude: 2.5 V
Lead Channel Setting Pacing Pulse Width: 0.4 ms
Lead Channel Setting Sensing Sensitivity: 2.5 mV
Pulse Gen Serial Number: 542710
Zone Setting Status: 755011

## 2024-05-03 ENCOUNTER — Ambulatory Visit: Payer: Self-pay | Admitting: Cardiovascular Disease

## 2024-05-06 NOTE — Progress Notes (Signed)
 Remote PPM Transmission

## 2024-05-09 NOTE — Progress Notes (Signed)
 Remote PPM Transmission

## 2024-05-12 DIAGNOSIS — G4733 Obstructive sleep apnea (adult) (pediatric): Secondary | ICD-10-CM | POA: Diagnosis not present

## 2024-05-13 ENCOUNTER — Other Ambulatory Visit: Payer: Self-pay | Admitting: Adult Health

## 2024-05-13 DIAGNOSIS — G4733 Obstructive sleep apnea (adult) (pediatric): Secondary | ICD-10-CM | POA: Diagnosis not present

## 2024-05-13 DIAGNOSIS — J449 Chronic obstructive pulmonary disease, unspecified: Secondary | ICD-10-CM | POA: Diagnosis not present

## 2024-05-13 DIAGNOSIS — R21 Rash and other nonspecific skin eruption: Secondary | ICD-10-CM

## 2024-05-15 ENCOUNTER — Ambulatory Visit (INDEPENDENT_AMBULATORY_CARE_PROVIDER_SITE_OTHER)

## 2024-05-15 ENCOUNTER — Ambulatory Visit: Payer: Self-pay | Admitting: Primary Care

## 2024-05-15 ENCOUNTER — Ambulatory Visit: Admitting: Primary Care

## 2024-05-15 ENCOUNTER — Encounter: Payer: Self-pay | Admitting: Primary Care

## 2024-05-15 VITALS — BP 138/72 | HR 61 | Temp 97.5°F | Ht 66.0 in | Wt 221.6 lb

## 2024-05-15 DIAGNOSIS — J9611 Chronic respiratory failure with hypoxia: Secondary | ICD-10-CM

## 2024-05-15 DIAGNOSIS — Z96612 Presence of left artificial shoulder joint: Secondary | ICD-10-CM | POA: Diagnosis not present

## 2024-05-15 DIAGNOSIS — Z23 Encounter for immunization: Secondary | ICD-10-CM | POA: Diagnosis not present

## 2024-05-15 DIAGNOSIS — Z950 Presence of cardiac pacemaker: Secondary | ICD-10-CM | POA: Diagnosis not present

## 2024-05-15 DIAGNOSIS — R0602 Shortness of breath: Secondary | ICD-10-CM

## 2024-05-15 DIAGNOSIS — Z471 Aftercare following joint replacement surgery: Secondary | ICD-10-CM | POA: Diagnosis not present

## 2024-05-15 DIAGNOSIS — G4733 Obstructive sleep apnea (adult) (pediatric): Secondary | ICD-10-CM

## 2024-05-15 LAB — BASIC METABOLIC PANEL WITH GFR
BUN: 21 mg/dL (ref 6–23)
CO2: 31 meq/L (ref 19–32)
Calcium: 10.3 mg/dL (ref 8.4–10.5)
Chloride: 100 meq/L (ref 96–112)
Creatinine, Ser: 1.74 mg/dL — ABNORMAL HIGH (ref 0.40–1.50)
GFR: 37.22 mL/min — ABNORMAL LOW (ref >60.00)
Glucose, Bld: 103 mg/dL — ABNORMAL HIGH (ref 70–99)
Potassium: 4.1 meq/L (ref 3.5–5.1)
Sodium: 141 meq/L (ref 135–145)

## 2024-05-15 LAB — BRAIN NATRIURETIC PEPTIDE: Pro B Natriuretic peptide (BNP): 145 pg/mL — ABNORMAL HIGH (ref 0.0–100.0)

## 2024-05-15 NOTE — Patient Instructions (Addendum)
  VISIT SUMMARY: Today, you were seen for worsening shortness of breath. We discussed your history of aortic valve replacement and sleep apnea, and reviewed your current symptoms and treatments. You reported issues with your BiPAP therapy, weight gain, and increased phlegm production.  YOUR PLAN: -OBSTRUCTIVE SLEEP APNEA WITH SUBOPTIMAL BIPAP THERAPY ADHERENCE: Obstructive sleep apnea is a condition where your airway becomes blocked during sleep, causing breathing pauses. Your current BiPAP usage is not fully controlling your sleep apnea. We recommend switching to a full face mask for better control and ensuring your mask is changed regularly. We will follow up in 4-6 weeks to see if there is improvement.  -Shortness of breath- Your symptoms have worsened since your aortic valve surgery, and you have noticed increased shortness of breath and phlegm production. We will order a chest x-ray and perform blood work to check for fluid retention and other possible causes. Based on the results, we may adjust your diuretic medication.  INSTRUCTIONS: Please follow up in 4-6 weeks to assess the improvement in your sleep apnea control. Additionally, we will contact you with the results of your chest x-ray and blood work to determine if any changes to your treatment plan are needed.  Orders: Labs and CXR  Follow-up 4-6 weeks with Landry NP

## 2024-05-15 NOTE — Progress Notes (Signed)
 ATCx1, LVMTCB. Sending MyChart message. NFN

## 2024-05-15 NOTE — Progress Notes (Signed)
Please let patient know CXR was normal

## 2024-05-15 NOTE — Progress Notes (Signed)
 @Patient  ID: Johnny [name], male    DOB: 1946/05/11, 78 y.o.   MRN: [id number]  Chief Complaint  Patient presents with   Obstructive Sleep Apnea    Referring provider: Medina-Vargas, Jereld BROCKS*  HPI: 78 year old male, former smoker.  Past medical history significant for heart failure, coronary artery disease, hypertension, NSTEMI, A-fib, aortic stenosis, chronic respiratory failure with hypoxia, OSA, chronic kidney disease, hyperlipidemia.  Previous [facility] pulmonary encounter: 09/26/2023 Patient presents today for 3 month follow-up/OSA. 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  Patient has symptoms of loud snoring and excessive daytime sleepiness.  Epworth score 23.  Patient underwent BIPAP titration study in November, optimal pressure selected for patient 24/20 cm H2O.  Patient had severe oxygen  desaturations.  Minimal O2 at optimal pressure was 88%. BiPAP therapy on IPAP 24/ EPAP 20 cm H2O with a Large size [employer] Full Face Evora Full mask and heated humidification.   He tells me that he BIPAP order was sent to the wrong DME company, he had previous billing issues with [employer] and he would like order be sent to [employer] [facility]. He is using 2-3L oxygen  at night. Oxygen  supplies by [employer].    11/27/2023 Discussed the use of AI scribe software for clinical note transcription with the patient, who gave verbal consent to proceed.  History of Present Illness   Johnny [name] is a 78 year old male with sleep apnea who presents for a follow-up regarding his BiPAP therapy.  He has a history of sleep apnea, initially diagnosed with symptoms of snoring and fatigue. A CPAP titration study revealed oxygen  desaturation to 77%, and optimal pressure settings were determined to be 24/ 20cm h20. An order for a BiPAP machine was placed in November. [employer] reports that they reached out to patient regarding BIPAP machine but were unable to connect with him. He is currently using oxygen  at night until  he receives his BiPAP machine.  He has been experiencing ongoing snoring and sleep disruption, which have impacted his ability to work. He previously worked for [employer] [place] but had to resign due to sleep-related issues. 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  He mentions having lab work done in January that showed abnormal thyroid  and calcium  levels. He had an ultrasound done, but the results were unclear. He is following with primary care and endocrinology.       05/15/2024- Interim hx Discussed the use of AI scribe software for clinical note transcription with the patient, who gave verbal consent to proceed.  History of Present Illness Johnny [name] is a 78 year old male with aortic valve replacement and sleep apnea who presents with worsening shortness of breath.  He has been experiencing worsening shortness of breath since before his aortic valve surgery. Initially, he was doing well post-surgery but has since noticed a decline in his ability to walk far or talk for extended periods. He uses oxygen  at night and alternates between CPAP and oxygen  therapy. His portable oxygen  was returned due to insurance issues. He is currently taking Lasix  40 mg daily and has noticed a weight gain of four pounds, from 217 to 221 pounds. He uses a rescue inhaler, which provides only temporary relief from wheezing.  He has a history of sleep apnea, initially diagnosed with symptoms of snoring and fatigue. A CPAP titration study revealed oxygen  drops to 77%, and he was placed on a BiPAP machine in November. He uses the BiPAP machine  67% of the time for about three to four hours per night. He reports issues with air leaks and dry mouth, which were previously addressed by changing his nasal pillow mask in the summer. He has three different full face masks at home.  He experiences dizziness, which has led to the cancellation of some doctor's appointments. He  also reports issues with phlegm production, which started in the summer and is discolored, accompanied by post-nasal drip and gagging. No dialysis, but he mentions needing to urinate frequently.      Allergies  Allergen Reactions   Metformin And Related Other (See Comments)    Metformin caused the patient's legs to become weak    Immunization History  Administered Date(s) Administered   Fluad Quad(high Dose 65+) 05/06/2020, 06/22/2022   Fluad Trivalent(High Dose 65+) 04/19/2023   INFLUENZA, HIGH DOSE SEASONAL PF 06/05/2014, 04/20/2015, 06/08/2016, 04/22/2017, 04/15/2021   Influenza Split 09/15/2009, 05/03/2013   Influenza-Unspecified 07/14/2004, 05/07/2007, 05/02/2008, 05/01/2018, 04/29/2019   PPD Test 06/08/2021   Pneumococcal Conjugate-13 06/05/2014   Pneumococcal Polysaccharide-23 08/13/2012   Tdap 11/04/2019    Past Medical History:  Diagnosis Date   Atrial fibrillation (HCC)    Deafness in left ear    Hypertension    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 Wonda and Dr. 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     amiodarone  (PACERONE ) 200 MG tablet TAKE 1 TABLET BY MOUTH DAILY 100 tablet 1   amoxicillin  (AMOXIL ) 500 MG tablet Take 4 tablets (2,000 mg total) by mouth as directed. 1 hour prior to dental work  including cleanings 12 tablet 12   apixaban  (ELIQUIS ) 5 MG TABS tablet TAKE 1 TABLET BY MOUTH TWICE  DAILY 180 tablet 3   carvedilol  (COREG ) 6.25 MG tablet TAKE 1 TABLET BY MOUTH TWICE  DAILY 180 tablet 3   cyanocobalamin  (VITAMIN B12) 1000 MCG tablet Take 1 tablet (1,000 mcg total) by mouth daily. 90 tablet 1   famotidine  (PEPCID ) 40 MG tablet Take 40 mg by mouth daily.     fluticasone  (FLONASE ) 50 MCG/ACT nasal spray Place 2 sprays into both nostrils daily.     furosemide  (LASIX ) 40 MG tablet Take 1 tablet (40 mg total) by mouth daily. 90 tablet 1   glucose blood test strip 1 each by Other route daily. [facility] Glucometer 100 each 12   isosorbide  mononitrate (IMDUR ) 30 MG 24 hr tablet TAKE 1 TABLET BY MOUTH DAILY 100 tablet 2   loratadine  (CLARITIN ) 10 MG tablet Take 1 tablet (10 mg total) by mouth daily. 30 tablet 11   mirtazapine  (REMERON  SOL-TAB) 30 MG disintegrating tablet Take 1 tablet (30 mg total) by mouth at bedtime. 90 tablet 3   montelukast  (SINGULAIR ) 10 MG tablet Take 10 mg by  mouth daily.     nitroGLYCERIN  (NITROSTAT ) 0.4 MG SL tablet DISSOLVE 1 TABLET UNDER THE  TONGUE EVERY 5 MINUTES AS NEEDED FOR CHEST PAIN. MAX OF 3 TABLETS IN 15 MINUTES. CALL 911 IF PAIN  PERSISTS. 125 tablet 1   pantoprazole  (PROTONIX ) 40 MG tablet TAKE 1 TABLET BY MOUTH DAILY  BEFORE BREAKFAST 100 tablet 2   ranolazine  (RANEXA ) 500 MG 12 hr tablet Take 1 tablet (500 mg total) by mouth 2 (two) times daily. 180 tablet 3   rosuvastatin  (CRESTOR ) 40 MG tablet TAKE 1 TABLET BY MOUTH DAILY 100 tablet 2   venlafaxine  XR (EFFEXOR -XR) 150 MG 24 hr capsule TAKE 1 CAPSULE BY MOUTH DAILY  WITH BREAKFAST 90 capsule 3   No facility-administered medications prior to visit.      Review of Systems  Review of Systems  Constitutional: Negative.   Respiratory:  Positive for shortness of breath. Negative for wheezing.    Physical Exam  BP 138/72   Pulse 61   Temp (!) 97.5 F (36.4 C)   Ht 5' 6 (1.676 m)    Wt 221 lb 9.6 oz (100.5 kg)   SpO2 95% Comment: ra  BMI 35.77 kg/m  Physical Exam Constitutional:      Appearance: Normal appearance. He is obese. He is not ill-appearing.  Cardiovascular:     Rate and Rhythm: Normal rate and regular rhythm.  Pulmonary:     Effort: Pulmonary effort is normal.     Breath sounds: Rales present.     Comments: Rales right base  Musculoskeletal:        General: Normal range of motion.  Skin:    General: Skin is warm and dry.  Neurological:     General: No focal deficit present.     Mental Status: He is alert and oriented to person, place, and time. Mental status is at baseline.  Psychiatric:        Mood and Affect: Mood normal.        Behavior: Behavior normal.        Thought Content: Thought content normal.        Judgment: Judgment normal.     CBC    Component Value Date/Time   WBC 8.5 02/07/2024 0328   RBC 5.44 02/07/2024 0328   HGB 15.4 02/07/2024 0328   HGB 13.6 12/01/2023 1212   HCT 47.9 02/07/2024 0328   HCT 41.7 12/01/2023 1212   PLT 190 02/07/2024 0328   PLT 182 12/01/2023 1212   MCV 88.1 02/07/2024 0328   MCV 88 12/01/2023 1212   MCH 28.3 02/07/2024 0328   MCHC 32.2 02/07/2024 0328   RDW 17.3 (H) 02/07/2024 0328   RDW 17.0 (H) 12/01/2023 1212   LYMPHSABS 1.3 12/01/2023 1212   MONOABS 0.9 11/15/2022 1816   EOSABS 0.2 12/01/2023 1212   BASOSABS 0.1 12/01/2023 1212    BMET    Component Value Date/Time   NA 140 02/07/2024 0748   NA 142 12/01/2023 1212   K 4.5 02/07/2024 0748   CL 102 02/07/2024 0748   CO2 29 02/07/2024 0748   GLUCOSE 102 (H) 02/07/2024 0748   BUN 21 02/07/2024 0748   BUN 29 (H) 12/01/2023 1212   CREATININE 1.80 (H) 02/07/2024 0748   CREATININE 2.10 (H) 08/29/2023 0857   CALCIUM  10.8 (H) 02/07/2024 0748   GFRNONAA 38 (L) 02/07/2024 0748    BNP    Component Value Date/Time   BNP 75.9 11/15/2022 1800    ProBNP No results  found for: PROBNP  Imaging: CUP PACEART REMOTE DEVICE CHECK Result  Date: 05/02/2024 PPM Scheduled remote reviewed. Normal device function.  Presenting rhythm:  AP/VS Next remote transmission per protocol. LA, CVRS    Assessment & Plan:    1. OSA (obstructive sleep apnea) (Primary)  2. Chronic respiratory failure with hypoxia (HCC)  Assessment and Plan Assessment & Plan Obstructive sleep apnea Current BiPAP usage is 67% of the time, averaging 3-4 hours per night. Current settings 24/20cm h20; residual (AHI) is 18 events per hour, indicating suboptimal control. Previous sleep study showed severe sleep apnea with an AHI of 60, reduced to 18 with current therapy. Patient is experiencing significant amount of airleaks, 53L/min at 95th percentile. Air leaks from nasal pillows may contribute to residual events. Mask was last changed in the summer, and a full face mask is recommended for better control. - Recommend patient change to full face mask for BiPAP therapy - Ensure mask is changed regularly - Follow up in 4-6 weeks to assess improvement in sleep apnea control  Dyspnea with chronic respiratory failure with hypoxia Symptoms worsened post-aortic valve surgery. Reports difficulty with phlegm production, discolored phlegm, and post-nasal drip. Weight gain of 4 pounds noted, possibly indicating fluid retention. Oxygen  saturation is 95% on room air. Possible fluid overload suggested by crackles on lung auscultation. - Order chest x-ray to evaluate for acute changes - Perform blood work to assess for fluid retention and other potential causes of symptoms  Addendum CXR without acute process, BNP 145. Advised follow-up with cardiology to address diuretics   Almarie LELON Ferrari, NP 05/15/2024

## 2024-05-17 ENCOUNTER — Ambulatory Visit

## 2024-05-17 NOTE — Progress Notes (Deleted)
 Patient ID: Woodard Perrell                 DOB: 07/23/1946                      MRN: [id number]      HPI: Johnny [name] is a 78 y.o. male referred by Dr. Kate to HTN clinic. PMH is significant for HTN, Afib, CAD s/p PCI to RCA and LCx & CABG (2015) , HLD, PAD, HFpEF, OSA, CKD (stage 3)  Patient presents to [facility] HTN clinic. He presents with his blood pressure cuff for calibration and home BP readings.   Current HTN meds: carvedilol  6.25 mg twice daily, furosemide  40 mg daily, isosorbide  mononitrate 30 mg daily  Previously tried: none BP goal: <130/80  Family History:  Relation Problem Comments  Mother (Deceased)   Father (Deceased) Cancer     Sister - Dealer   Sister - Information systems manager (Alive)   Sister - Engineer, building services (Alive) Stroke     Brother - Albert Stroke x 2    Daughter - Deward   Son - Adam   Son - Curtistine Metallurgist)      Social History: Alcohol: Smoking:   Diet:  Breakfast Lunch/Dinner Snacks: Drinks:  Exercise:    Home BP readings:    Wt Readings from Last 3 Encounters:  05/15/24 221 lb 9.6 oz (100.5 kg)  03/18/24 198 lb 3.2 oz (89.9 kg)  02/12/24 217 lb (98.4 kg)   BP Readings from Last 3 Encounters:  05/15/24 138/72  03/18/24 120/82  02/12/24 108/70   Pulse Readings from Last 3 Encounters:  05/15/24 61  03/18/24 69  02/12/24 (!) 58    Renal function: Estimated Creatinine Clearance: 38.8 mL/min (A) (by C-G formula based on SCr of 1.74 mg/dL (H)).  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 Wonda and Dr. Lucas   Symptomatic bradycardia 04/2021   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[place], [place]    Current Outpatient Medications on File Prior to Visit  Medication Sig Dispense Refill   albuterol  (VENTOLIN  HFA) 108 (90 Base) MCG/ACT inhaler Inhale 2 puffs into the lungs  every 6 (six) hours as needed for wheezing or shortness of breath.     amiodarone  (PACERONE ) 200 MG tablet TAKE 1 TABLET BY MOUTH DAILY 100 tablet 1   amoxicillin  (AMOXIL ) 500 MG tablet Take 4 tablets (2,000 mg total) by mouth as directed. 1 hour prior to dental work including cleanings 12 tablet 12   apixaban  (ELIQUIS ) 5 MG TABS tablet TAKE 1 TABLET BY MOUTH TWICE  DAILY 180 tablet 3   carvedilol  (COREG ) 6.25 MG tablet TAKE 1 TABLET BY MOUTH TWICE  DAILY 180 tablet 3   cyanocobalamin  (VITAMIN B12) 1000 MCG tablet Take 1 tablet (1,000 mcg total) by mouth daily. 90 tablet 1   famotidine  (PEPCID ) 40 MG tablet Take 40 mg by mouth daily.     fluticasone  (FLONASE ) 50 MCG/ACT nasal spray Place 2 sprays into both nostrils daily.     furosemide  (LASIX ) 40 MG tablet Take 1 tablet (40 mg total) by mouth daily. 90 tablet 1   glucose blood test strip 1 each by Other route daily. [facility] Glucometer 100 each 12   isosorbide  mononitrate (IMDUR ) 30  MG 24 hr tablet TAKE 1 TABLET BY MOUTH DAILY 100 tablet 2   loratadine  (CLARITIN ) 10 MG tablet Take 1 tablet (10 mg total) by mouth daily. 30 tablet 11   mirtazapine  (REMERON  SOL-TAB) 30 MG disintegrating tablet Take 1 tablet (30 mg total) by mouth at bedtime. 90 tablet 3   montelukast  (SINGULAIR ) 10 MG tablet Take 10 mg by mouth daily.     nitroGLYCERIN  (NITROSTAT ) 0.4 MG SL tablet DISSOLVE 1 TABLET UNDER THE  TONGUE EVERY 5 MINUTES AS NEEDED FOR CHEST PAIN. MAX OF 3 TABLETS IN 15 MINUTES. CALL 911 IF PAIN  PERSISTS. 125 tablet 1   pantoprazole  (PROTONIX ) 40 MG tablet TAKE 1 TABLET BY MOUTH DAILY  BEFORE BREAKFAST 100 tablet 2   ranolazine  (RANEXA ) 500 MG 12 hr tablet Take 1 tablet (500 mg total) by mouth 2 (two) times daily. 180 tablet 3   rosuvastatin  (CRESTOR ) 40 MG tablet TAKE 1 TABLET BY MOUTH DAILY 100 tablet 2   venlafaxine  XR (EFFEXOR -XR) 150 MG 24 hr capsule TAKE 1 CAPSULE BY MOUTH DAILY  WITH BREAKFAST 90 capsule 3   No current facility-administered  medications on file prior to visit.    Allergies  Allergen Reactions   Metformin And Related Other (See Comments)    Metformin caused the patient's legs to become weak    There were no vitals taken for this visit.   Assessment/Plan:  1. Hypertension -  No problem-specific Assessment & Plan notes found for this encounter.      Thank you  Izaih Kataoka E. Magdeline Prange, Pharm.D [facility]. [facility] [address], [place], [place] [address] Phone: [phone]; Fax: [phone]

## 2024-05-19 ENCOUNTER — Other Ambulatory Visit: Payer: Self-pay | Admitting: Family

## 2024-05-19 ENCOUNTER — Other Ambulatory Visit: Payer: Self-pay | Admitting: Adult Health

## 2024-05-20 NOTE — Telephone Encounter (Signed)
 Pharmacy requested refill.  Pended Rx and sent to [facility] for approval due to HIGH ALERT Warning.

## 2024-05-21 ENCOUNTER — Telehealth: Payer: Self-pay

## 2024-05-21 NOTE — Telephone Encounter (Signed)
 Called Johnny [name] and gave order for Home Health skilled nursing.

## 2024-05-21 NOTE — Telephone Encounter (Signed)
 Copied from CRM #[id number]. Topic: Clinical - Home Health Verbal Orders >> May 21, 2024  1:32 PM Cherylann RAMAN wrote: Caller/Agency: Cindy/ [facility] Callback Number: [phone] Confidential VM may leave detailed msg Service Requested: Skilled Nursing Frequency: 1 week 3 every other week for 4 weeks Any new concerns about the patient? No

## 2024-05-22 DIAGNOSIS — E1122 Type 2 diabetes mellitus with diabetic chronic kidney disease: Secondary | ICD-10-CM | POA: Diagnosis not present

## 2024-05-22 DIAGNOSIS — I2510 Atherosclerotic heart disease of native coronary artery without angina pectoris: Secondary | ICD-10-CM | POA: Diagnosis not present

## 2024-05-22 DIAGNOSIS — N1832 Chronic kidney disease, stage 3b: Secondary | ICD-10-CM | POA: Diagnosis not present

## 2024-05-22 DIAGNOSIS — N179 Acute kidney failure, unspecified: Secondary | ICD-10-CM | POA: Diagnosis not present

## 2024-05-22 DIAGNOSIS — I5032 Chronic diastolic (congestive) heart failure: Secondary | ICD-10-CM | POA: Diagnosis not present

## 2024-05-22 DIAGNOSIS — E782 Mixed hyperlipidemia: Secondary | ICD-10-CM | POA: Diagnosis not present

## 2024-05-22 DIAGNOSIS — I130 Hypertensive heart and chronic kidney disease with heart failure and stage 1 through stage 4 chronic kidney disease, or unspecified chronic kidney disease: Secondary | ICD-10-CM | POA: Diagnosis not present

## 2024-05-22 DIAGNOSIS — Z48812 Encounter for surgical aftercare following surgery on the circulatory system: Secondary | ICD-10-CM | POA: Diagnosis not present

## 2024-05-22 DIAGNOSIS — J9611 Chronic respiratory failure with hypoxia: Secondary | ICD-10-CM | POA: Diagnosis not present

## 2024-05-22 DIAGNOSIS — G4733 Obstructive sleep apnea (adult) (pediatric): Secondary | ICD-10-CM | POA: Diagnosis not present

## 2024-05-22 DIAGNOSIS — Z952 Presence of prosthetic heart valve: Secondary | ICD-10-CM | POA: Diagnosis not present

## 2024-05-22 DIAGNOSIS — I480 Paroxysmal atrial fibrillation: Secondary | ICD-10-CM | POA: Diagnosis not present

## 2024-05-26 NOTE — Progress Notes (Deleted)
 Cardiology Office Note:    Date:  05/26/2024   ID:  Johnny [name], DOB May 24, 1946, MRN [id number]  PCP:  Phyllis Jereld BROCKS, NP  Cardiologist:  Lonni LITTIE Nanas, MD  Electrophysiologist:  None   Referring MD: Phyllis Jereld BROCKS*   No chief complaint on file.   History of Present Illness:    Johnny [name] is a 78 y.o. male with a hx of CAD status post CABG in 2015, aortic stenosis s/p TAVR, diastolic heart failure, OSA, atrial fibrillation, symptomatic bradycardia status post PPM who presents for  follow-up.  He was admitted 11/2021 with chest pain.  He had moved from [place]  in 09/2021.  Echocardiogram 12/15/2021 showed EF 55 to 60%, low normal RV function, mild to moderate left atrial enlargement, small pericardial effusion, moderate aortic stenosis.  Lexiscan  Myoview  on 12/16/2021 showed no ischemia or infarction, EF 58%.  Echocardiogram 06/2022 showed EF 65 to 70%, normal RV function, moderate aortic stenosis (mean gradient 17 mmHg, V-max 2.9 m/s, VTI 1.2 cm, DI 0.37).  He was admitted 10/2022 with chest pain.  Echo 11/16/2022 showed EF 65 to 70%, grade 2 diastolic dysfunction, mild RV systolic dysfunction, moderate aortic stenosis.  LHC 11/17/2022 showed patent LIMA-LAD, occluded SVG-OM, patent mid to distal LCx stent, occluded distal LCx.  Echocardiogram 10/2023 showed EF 60 to 65%, G2DD, normal RV function, moderate to severe aortic stenosis (mean gradient 32 mmHg, V-max 3.9 m/s, AVA 0.9 cm).  He was referred to valve clinic and underwent TAVR on 01/30/2024 with 29 mm Medtronic valve.  Echocardiogram 03/18/2024 showed EF 50 to 55%, moderate LVH, normal RV function, normal functioning TAVR valve with mean gradient 6 mmHg.  Since last clinic visit,  he reports he has been having issues with chest pain and dyspnea with exertion.  States that just walking from the waiting room to the office he feels short of breath.  Also gets chest pain and lightheadedness, but denies any  syncope.  He is taking Eliquis , denies any bleeding issues.  No recent falls.   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 Wonda and Dr. Lucas   Symptomatic bradycardia 04/2021   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[place], [place]    Past Surgical History:  Procedure Laterality Date   BACK SURGERY  1975   Lower Back Surgery, Dr.Seltzer   BACK SURGERY  1985   Dr.Seltzer   INTRAOPERATIVE TRANSTHORACIC ECHOCARDIOGRAM N/A 01/30/2024   Procedure: ECHOCARDIOGRAM, TRANSTHORACIC;  Surgeon: Wonda Sharper, MD;  [facility]: [facility] INVASIVE CV LAB;  Service: Cardiovascular;  Laterality: N/A;   LEFT HEART CATH AND CORS/GRAFTS ANGIOGRAPHY N/A 11/17/2022   Procedure: LEFT HEART CATH AND CORS/GRAFTS ANGIOGRAPHY;  Surgeon: Dann Candyce RAMAN, MD;  Location: [facility] INVASIVE CV LAB;  Service: Cardiovascular;  Laterality: N/A;   RIGHT/LEFT HEART CATH AND CORONARY/GRAFT ANGIOGRAPHY N/A 12/21/2023   Procedure: RIGHT/LEFT HEART CATH AND CORONARY/GRAFT ANGIOGRAPHY;  Surgeon: Wendel Lurena POUR, MD;  Location: [facility] INVASIVE CV LAB;  Service: Cardiovascular;  Laterality: N/A;   SHOULDER SURGERY Left 1965   Dr.Seltzer   TONSILLECTOMY  1950   Dr.Perellie  Current Medications: No outpatient medications have been marked as taking for the 05/27/24 encounter (Appointment) with Kate Lonni CROME, MD.     Allergies:   Metformin and related   Social History   Socioeconomic History   Marital status: Widowed    Spouse name: Not on file   Number of children: Not on file   Years of education: Not on file   Highest education level: 12th grade  Occupational History   Not on file  Tobacco Use    Smoking status: Former    Current packs/day: 1.00    Average packs/day: 1 pack/day for 10.0 years (10.0 ttl pk-yrs)    Types: Cigarettes   Smokeless tobacco: Never   Tobacco comments:    Quit at age 3  Vaping Use   Vaping status: Never Used  Substance and Sexual Activity   Alcohol use: Yes    Alcohol/week: 1.0 standard drink of alcohol    Types: 1 Glasses of wine per week    Comment: occasion   Drug use: Never   Sexual activity: Not on file  Other Topics Concern   Not on file  Social History Narrative   Tobacco use, amount per day now: None   Past tobacco use, amount per day: 1 pack   How many years did you use tobacco: 10 years.   Alcohol use (drinks per week): None   Diet:   Do you drink/eat things with caffeine: Yes   Marital status:  Widow                                What year were you married? 1972   Do you live in a house, apartment, assisted living, condo, trailer, etc.? House   Is it one or more stories? One   How many persons live in your home? One   Do you have pets in your home?( please list) No   Highest Level of education completed? 12th grade.   Current or past profession: Psychologist, Occupational, Investment Banker, Operational, Clinical Biochemist.    Do you exercise?   No                               Type and how often?   Do you have a living will? Yes   Do you have a DNR form?     No                              If not, do you want to discuss one?   Do you have signed POA/HPOA forms?  Yes                      If so, please bring to you appointment      Do you have any difficulty bathing or dressing yourself? No   Do you have any difficulty preparing food or eating? No   Do you have any difficulty managing your medications? No   Do you have any difficulty managing your finances? Yes   Do you have any difficulty affording your medications? No   Social Drivers of Corporate Investment Banker Strain: High Risk (07/02/2023)   Overall Financial Resource Strain (CARDIA)    Difficulty of Paying Living  Expenses: Hard  Food Insecurity: Food Insecurity Present (01/31/2024)   Hunger Vital Sign  Worried About Programme Researcher, Broadcasting/film/video in the Last Year: Sometimes true    [employer] of Food in the Last Year: Sometimes true  Transportation Needs: Unmet Transportation Needs (01/31/2024)   PRAPARE - Administrator, Civil Service (Medical): Yes    Lack of Transportation (Non-Medical): No  Physical Activity: Unknown (07/02/2023)   Exercise Vital Sign    Days of Exercise per Week: 0 days    Minutes of Exercise per Session: Not on file  Stress: Stress Concern Present (07/02/2023)   [employer] of Occupational Health - Occupational Stress Questionnaire    Feeling of Stress : To some extent  Social Connections: Moderately Isolated (01/31/2024)   Social Connection and Isolation Panel    Frequency of Communication with Friends and Family: More than three times a week    Frequency of Social Gatherings with Friends and Family: Never    Attends Religious Services: More than 4 times per year    Active Member of [employer] or Organizations: No    Attends Banker Meetings: Not on file    Marital Status: Widowed     Family History: The patient's family history includes Cancer in his father; Stroke in his brother and sister.  ROS:   Please see the history of present illness.     All other systems reviewed and are negative.  EKGs/Labs/Other Studies Reviewed:    The following studies were reviewed today:   EKG:   12/29/2021: Atrial paced rhythm, rate 60, diffuse less than 1 mm ST depressions 11/10/2022: Atrial paced rhythm, rate 63, nonspecific T wave flattening 02/14/2023: Atrial paced rhythm, rate 60, diffuse less than 1 mm ST depressions 12/01/23: Atrial paced rhythm, right bundle branch block, rate 60  Recent Labs: 08/29/2023: TSH 1.96 01/26/2024: ALT 20 01/31/2024: Magnesium  1.8 02/07/2024: Hemoglobin 15.4; Platelets 190 05/15/2024: BUN 21; Creatinine, Ser 1.74; Potassium 4.1; Pro B  Natriuretic peptide (BNP) 145.0; Sodium 141  Recent Lipid Panel    Component Value Date/Time   CHOL 151 12/01/2023 1212   TRIG 214 (H) 12/01/2023 1212   HDL 50 12/01/2023 1212   CHOLHDL 3.0 12/01/2023 1212   CHOLHDL 2.3 10/10/2022 1510   VLDL 49 (H) 06/19/2022 1519   LDLCALC 66 12/01/2023 1212   LDLCALC 64 10/10/2022 1510    Physical Exam:    VS:  There were no vitals taken for this visit.    Wt Readings from Last 3 Encounters:  05/15/24 221 lb 9.6 oz (100.5 kg)  03/18/24 198 lb 3.2 oz (89.9 kg)  02/12/24 217 lb (98.4 kg)     GEN:  Well nourished, well developed in no acute distress HEENT: Normal NECK: No JVD; No carotid bruits CARDIAC: RRR, 2/6 systolic murmur RESPIRATORY:  Clear to auscultation without rales, wheezing or rhonchi  ABDOMEN: Soft, non-tender, non-distended MUSCULOSKELETAL:  No edema; No deformity  SKIN: Warm and dry NEUROLOGIC:  Alert and oriented x 3 PSYCHIATRIC:  Normal affect   ASSESSMENT:    No diagnosis found.      PLAN:    Aortic stenosis s/p TAVR: Echocardiogram 10/2023 showed EF 60 to 65%, G2DD, normal RV function, moderate to severe aortic stenosis (mean gradient 32 mmHg, V-max 3.9 m/s, AVA 0.9 cm).  He is reporting dyspnea with minimal exertion, as well as chest pain and lightheadedness.  He was referred to valve clinic and underwent TAVR on 01/30/2024 with 29 mm Medtronic valve.  Echocardiogram 03/18/2024 showed EF 50 to 55%, moderate LVH, normal RV function, normal functioning  TAVR valve with mean gradient 6 mmHg.  CAD: status post CABG.  Coronary angiography 06/07/21: normal left main with severe mid-LAD disease and patent LIMA graft to LAD but with slow flow in the distal graft with possible filling defect vs streaming; in either case, the distal vessel is very small, too small for PCI. There were patent stents in second obtuse marginal branch. Distal circumflex was chronically occluded with right to left collaterals. Sequential SVG to two OM  branches was chronically occluded. RCA contained a widely patent stent in proximal portion.  Echocardiogram 12/15/2021 showed EF 55 to 60%, low normal RV function, mild to moderate left atrial enlargement, small pericardial effusion, moderate aortic stenosis.  Lexiscan  Myoview  on 12/16/2021 showed no ischemia or infarction, EF 58%. LHC 11/17/2022 showed patent LIMA-LAD, occluded SVG-OM, patent mid to distal LCx stent, occluded distal LCx.   -Continue Eliquis , statin -Carvedilol  6.25 mg twice daily -Ranexa  500 mg twice daily -Imdur  30 mg daily   Chronic diastolic heart failure: Currently on lasix  40 mg every day.  Check BMET, magnesium ***   Atrial fibrillation: On Eliquis  5 mg twice daily and carvedilol  6.25 mg twice daily.  On Amio 200 mg daily.  In atrial paced rhythm.     Symptomatic bradycardia: Status post [employer] PPM.  Referred to Dr. Francyne for device follow-up -Needs to reestablish with Dr. Francyne for device management, will schedule   Hypertension: On carvedilol  6.25 mg twice daily and Imdur  30 mg daily.  Appears controlled   CKD stage IIIb: Baseline creatinine appears 1.5-1.8.  Creatinine 1.7 on 05/15/2024.  Check BMET   OSA: Severe OSA on sleep study 12/2021, needs BiPAP but was not started.  Referred to sleep medicine, planning to start Bipap***   Hyperlipidemia: On rosuvastatin  40 mg daily.  LDL 64 on 10/10/2022.  Update lipid panel   RTC in 6 months***   Medication Adjustments/Labs and Tests Ordered: Current medicines are reviewed at length with the patient today.  Concerns regarding medicines are outlined above.  No orders of the defined types were placed in this encounter.  No orders of the defined types were placed in this encounter.   There are no Patient Instructions on file for this visit.   Signed, Lonni LITTIE Nanas, MD  05/26/2024 2:21 PM    [facility]

## 2024-05-27 ENCOUNTER — Ambulatory Visit: Admitting: Cardiology

## 2024-05-28 NOTE — Progress Notes (Signed)
 ATC x1. LVMTCB

## 2024-05-29 ENCOUNTER — Ambulatory Visit: Admitting: Endocrinology

## 2024-05-30 ENCOUNTER — Ambulatory Visit: Payer: Medicare Other | Admitting: Psychology

## 2024-05-30 NOTE — Progress Notes (Signed)
 ATCx2 LVMTCB. Sending MyChart message per protocol as pt has been unable to be reached. NFN

## 2024-06-03 ENCOUNTER — Ambulatory Visit: Payer: Self-pay

## 2024-06-03 ENCOUNTER — Encounter: Payer: Self-pay | Admitting: Radiology

## 2024-06-03 NOTE — Telephone Encounter (Signed)
 FYI Only or Action Required?: FYI only for provider: appointment scheduled on 06/17/24.  Patient was last seen in primary care on 11/30/2023 by Medina-Vargas, Jereld BROCKS, NP.  Called Nurse Triage reporting Dizziness.  Symptoms began several months ago.  Interventions attempted: Rest, hydration, or home remedies.  Symptoms are: unchanged.  Triage Disposition: See PCP Within 2 Weeks  Patient/caregiver understands and will follow disposition?: Yes    Copied from CRM [id number]. Topic: Clinical - Red Word Triage >> Jun 03, 2024  1:09 PM Johnny [name] wrote: Red Word that prompted transfer to Nurse Triage: constantly dizzy - had to cancel appts unable to drive due to dizziness - would like referral or to know what he can do. Reason for Disposition  Dizziness is a chronic symptom (recurrent or ongoing AND present > 4 weeks)  Answer Assessment - Initial Assessment Questions Additional info: Requesting appointment on 06/17/24 due to transportation issues. Scheduled. ED precautions reviewed. Refused sooner appointment.   1. DESCRIPTION: Describe your dizziness.     Intermittent lightheaded and room spinning  2. LIGHTHEADED: Do you feel lightheaded? (e.g., somewhat faint, woozy, weak upon standing)     Lightheaded since aorta repair over summer June or July. Cardiologist aware 3. VERTIGO: Do you feel like either you or the room is spinning or tilting? (i.e., vertigo)     Room spinning  4. SEVERITY: How bad is it?  Do you feel like you are going to faint? Can you stand and walk?     Can't drive, ambulating with walker at baseline 5. ONSET:  When did the dizziness begin?     Over summer June or July  6. AGGRAVATING FACTORS: Does anything make it worse? (e.g., standing, change in head position)     Head movement 7. HEART RATE: Can you tell me your heart rate? How many beats in 15 seconds?  (Note: Not all patients can do this.)       Doesn't feel any change  8. CAUSE: What do  you think is causing the dizziness? (e.g., decreased fluids or food, diarrhea, emotional distress, heat exposure, new medicine, sudden standing, vomiting; unknown)     Equilibrium, ear.  9. RECURRENT SYMPTOM: Have you had dizziness before? If Yes, ask: When was the last time? What happened that time?     Intermittent -daily since summer June or July  10. OTHER SYMPTOMS: Do you have any other symptoms? (e.g., fever, chest pain, vomiting, diarrhea, bleeding)       Irregular bp low as 96/56 last week. No bp concern today. Increased thickening phlegm with supplemental o2 at night-followed by Pulm.  Protocols used: Dizziness - Lightheadedness-A-AH

## 2024-06-03 NOTE — Telephone Encounter (Signed)
 Pls schedule sooner if needed.

## 2024-06-11 ENCOUNTER — Encounter

## 2024-06-17 ENCOUNTER — Ambulatory Visit: Admitting: Adult Health

## 2024-06-17 DIAGNOSIS — I5032 Chronic diastolic (congestive) heart failure: Secondary | ICD-10-CM

## 2024-06-17 DIAGNOSIS — I480 Paroxysmal atrial fibrillation: Secondary | ICD-10-CM

## 2024-06-17 DIAGNOSIS — G4733 Obstructive sleep apnea (adult) (pediatric): Secondary | ICD-10-CM

## 2024-06-17 DIAGNOSIS — I350 Nonrheumatic aortic (valve) stenosis: Secondary | ICD-10-CM

## 2024-06-17 DIAGNOSIS — I10 Essential (primary) hypertension: Secondary | ICD-10-CM

## 2024-06-20 ENCOUNTER — Encounter: Payer: Self-pay | Admitting: Primary Care

## 2024-06-20 ENCOUNTER — Ambulatory Visit: Admitting: Primary Care

## 2024-06-20 ENCOUNTER — Other Ambulatory Visit: Payer: Self-pay | Admitting: Adult Health

## 2024-06-20 DIAGNOSIS — J9611 Chronic respiratory failure with hypoxia: Secondary | ICD-10-CM

## 2024-06-20 DIAGNOSIS — G4733 Obstructive sleep apnea (adult) (pediatric): Secondary | ICD-10-CM

## 2024-06-20 NOTE — Telephone Encounter (Signed)
 [employer] Glucometer

## 2024-06-20 NOTE — Telephone Encounter (Signed)
 Copied from CRM [id number]. Topic: Clinical - Medication Refill >> Jun 20, 2024  1:25 PM Carrielelia G wrote: Medication: glucose blood test strip  and [employer] Glucometer  Has the patient contacted their pharmacy? No (Agent: If no, request that the patient contact the pharmacy for the refill. If patient does not wish to contact the pharmacy document the reason why and proceed with request.) (Agent: If yes, when and what did the pharmacy advise?)  This is the patient's preferred pharmacy:  [id number] [address], [place] - [address] [place] [address] Phone: [phone] Fax: [phone]  Is this the correct pharmacy for this prescription? Yes If no, delete pharmacy and type the correct one.    Is the patient out of the medication? Yes  Has the patient been seen for an appointment in the last year OR does the patient have an upcoming appointment? Yes  Can we respond through MyChart? No  Agent: Please be advised that Rx refills may take up to 3 business days. We ask that you follow-up with your pharmacy.

## 2024-06-21 ENCOUNTER — Telehealth: Payer: Self-pay

## 2024-06-21 MED ORDER — GLUCOSE BLOOD VI STRP: 1.0000 | ORAL_STRIP | Freq: Every day | 12 refills | Status: DC

## 2024-06-21 NOTE — Telephone Encounter (Signed)
 Copied from CRM #[id number]. Topic: General - Other >> Jun 21, 2024  2:01 PM Cherylann RAMAN wrote: Reason for CRM: Pat with [facility] called to confirm patient's appt and provider's information. She will be sending over additional information regarding information that is needed for the patient re-obtain coverage for his oxygen .

## 2024-06-21 NOTE — Telephone Encounter (Signed)
 Noted, will await fax.

## 2024-07-04 ENCOUNTER — Encounter: Payer: Self-pay | Admitting: Cardiovascular Disease

## 2024-07-04 ENCOUNTER — Ambulatory Visit: Attending: Cardiovascular Disease | Admitting: Cardiovascular Disease

## 2024-07-28 NOTE — Progress Notes (Deleted)
 " Cardiology Office Note:    Date:  07/28/2024   ID:  Johnny [name], DOB 1945/08/06, MRN [id number]  PCP:  Phyllis Jereld BROCKS, NP  Cardiologist:  Lonni LITTIE Nanas, MD  Electrophysiologist:  None   Referring MD: Phyllis Jereld BROCKS, NP   No chief complaint on file.   History of Present Illness:    Johnny [name] is a 78 y.o. male with a hx of CAD status post CABG in 2015, moderate aortic stenosis, diastolic heart failure, OSA, atrial fibrillation, symptomatic bradycardia status post PPM who presents for hospital follow-up.  He was admitted 11/2021 with chest pain.  He had rmoved from [place]  in 09/2021.  Echocardiogram 12/15/2021 showed EF 55 to 60%, low normal RV function, mild to moderate left atrial enlargement, small pericardial effusion, moderate aortic stenosis.  Lexiscan  Myoview  on 12/16/2021 showed no ischemia or infarction, EF 58%.  Echocardiogram 06/2022 showed EF 65 to 70%, normal RV function, moderate aortic stenosis (mean gradient 17 mmHg, V-max 2.9 m/s, VTI 1.2 cm, DI 0.37).  He was admitted 10/2022 with chest pain.  Echo 11/16/2022 showed EF 65 to 70%, grade 2 diastolic dysfunction, mild RV systolic dysfunction, moderate aortic stenosis.  LHC 11/17/2022 showed patent LIMA-LAD, occluded SVG-OM, patent mid to distal LCx stent, occluded distal LCx.  Echocardiogram 10/2023 showed EF 60 to 65%, G2DD, normal RV function, moderate to severe aortic stenosis (mean gradient 32 mmHg, V-max 3.9 m/s, AVA 0.9 cm).  He was referred to valve clinic and underwent TAVR on 01/30/2024 with 29 mm Medtronic valve.  Echocardiogram 03/18/2024 showed EF 50 to 55%, moderate LVH, normal RV function, normal functioning TAVR valve with mean gradient 6 mmHg.   Since last clinic visit, he reports he has been having issues with chest pain and dyspnea with exertion.  States that just walking from the waiting room to the office he feels short of breath.  Also gets chest pain and lightheadedness, but denies  any syncope.  He is taking Eliquis , denies any bleeding issues.  No recent falls.   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 Wonda and Dr. Lucas   Symptomatic bradycardia 04/2021   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[place], [place]    Past Surgical History:  Procedure Laterality Date   BACK SURGERY  1975   Lower Back Surgery, Dr.Seltzer   BACK SURGERY  1985   Dr.Seltzer   INTRAOPERATIVE TRANSTHORACIC ECHOCARDIOGRAM N/A 01/30/2024   Procedure: ECHOCARDIOGRAM, TRANSTHORACIC;  Surgeon: Wonda Sharper, MD;  Location: [facility] INVASIVE CV LAB;  Service: Cardiovascular;  Laterality: N/A;   LEFT HEART CATH AND CORS/GRAFTS ANGIOGRAPHY N/A 11/17/2022   Procedure: LEFT HEART CATH AND CORS/GRAFTS ANGIOGRAPHY;  Surgeon: Dann Candyce RAMAN, MD;  Location: [facility] INVASIVE CV LAB;  Service: Cardiovascular;  Laterality: N/A;   RIGHT/LEFT HEART CATH AND CORONARY/GRAFT ANGIOGRAPHY N/A 12/21/2023   Procedure: RIGHT/LEFT HEART CATH AND CORONARY/GRAFT ANGIOGRAPHY;  Surgeon: Wendel Lurena POUR, MD;  Location: [facility] INVASIVE CV LAB;  Service: Cardiovascular;  Laterality: N/A;   SHOULDER SURGERY Left 1965   Dr.Seltzer   TONSILLECTOMY  1950  Dr.Perellie    Current Medications: No outpatient medications have been marked as taking for the 07/30/24 encounter (Appointment) with Kate Lonni CROME, MD.     Allergies:   Metformin and related   Social History   Socioeconomic History   Marital status: Widowed    Spouse name: Not on file   Number of children: Not on file   Years of education: Not on file   Highest education level: 12th grade  Occupational History   Not on file  Tobacco Use    Smoking status: Former    Current packs/day: 1.00    Average packs/day: 1 pack/day for 10.0 years (10.0 ttl pk-yrs)    Types: Cigarettes   Smokeless tobacco: Never   Tobacco comments:    Quit at age 56  Vaping Use   Vaping status: Never Used  Substance and Sexual Activity   Alcohol use: Yes    Alcohol/week: 1.0 standard drink of alcohol    Types: 1 Glasses of wine per week    Comment: occasion   Drug use: Never   Sexual activity: Not on file  Other Topics Concern   Not on file  Social History Narrative   Tobacco use, amount per day now: None   Past tobacco use, amount per day: 1 pack   How many years did you use tobacco: 10 years.   Alcohol use (drinks per week): None   Diet:   Do you drink/eat things with caffeine: Yes   Marital status:  Widow                                What year were you married? 1972   Do you live in a house, apartment, assisted living, condo, trailer, etc.? House   Is it one or more stories? One   How many persons live in your home? One   Do you have pets in your home?( please list) No   Highest Level of education completed? 12th grade.   Current or past profession: Psychologist, Occupational, Investment Banker, Operational, Clinical Biochemist.    Do you exercise?   No                               Type and how often?   Do you have a living will? Yes   Do you have a DNR form?     No                              If not, do you want to discuss one?   Do you have signed POA/HPOA forms?  Yes                      If so, please bring to you appointment      Do you have any difficulty bathing or dressing yourself? No   Do you have any difficulty preparing food or eating? No   Do you have any difficulty managing your medications? No   Do you have any difficulty managing your finances? Yes   Do you have any difficulty affording your medications? No   Social Drivers of Health   Tobacco Use: Medium Risk (05/15/2024)   Patient History    Smoking Tobacco Use: Former    Smokeless Tobacco Use: Never     Passive Exposure: Not on  file  Financial Resource Strain: High Risk (07/02/2023)   Overall Financial Resource Strain (CARDIA)    Difficulty of Paying Living Expenses: Hard  Food Insecurity: Food Insecurity Present (01/31/2024)   Epic    Worried About Programme Researcher, Broadcasting/film/video in the Last Year: Sometimes true    Ran Out of Food in the Last Year: Sometimes true  Transportation Needs: Unmet Transportation Needs (01/31/2024)   Epic    Lack of Transportation (Medical): Yes    Lack of Transportation (Non-Medical): No  Physical Activity: Unknown (07/02/2023)   Exercise Vital Sign    Days of Exercise per Week: 0 days    Minutes of Exercise per Session: Not on file  Stress: Stress Concern Present (07/02/2023)   [employer] of Occupational Health - Occupational Stress Questionnaire    Feeling of Stress : To some extent  Social Connections: Moderately Isolated (01/31/2024)   Social Connection and Isolation Panel    Frequency of Communication with Friends and Family: More than three times a week    Frequency of Social Gatherings with Friends and Family: Never    Attends Religious Services: More than 4 times per year    Active Member of [employer] or Organizations: No    Attends Banker Meetings: Not on file    Marital Status: Widowed  Depression (PHQ2-9): High Risk (11/30/2023)   Depression (PHQ2-9)    PHQ-2 Score: 17  Alcohol Screen: Low Risk (07/02/2023)   Alcohol Screen    Last Alcohol Screening Score (AUDIT): 2  Housing: Low Risk (01/31/2024)   Epic    Unable to Pay for Housing in the Last Year: No    Number of Times Moved in the Last Year: 0    Homeless in the Last Year: No  Utilities: Not At Risk (01/31/2024)   Epic    Threatened with loss of utilities: No  Health Literacy: Not on file     Family History: The patient's family history includes Cancer in his father; Stroke in his brother and sister.  ROS:   Please see the history of present illness.     All other systems reviewed and  are negative.  EKGs/Labs/Other Studies Reviewed:    The following studies were reviewed today:   EKG:   12/29/2021: Atrial paced rhythm, rate 60, diffuse less than 1 mm ST depressions 11/10/2022: Atrial paced rhythm, rate 63, nonspecific T wave flattening 02/14/2023: Atrial paced rhythm, rate 60, diffuse less than 1 mm ST depressions 12/01/23: Atrial paced rhythm, right bundle branch block, rate 60  Recent Labs: 08/29/2023: TSH 1.96 01/26/2024: ALT 20 01/31/2024: Magnesium  1.8 02/07/2024: Hemoglobin 15.4; Platelets 190 05/15/2024: BUN 21; Creatinine, Ser 1.74; Potassium 4.1; Pro B Natriuretic peptide (BNP) 145.0; Sodium 141  Recent Lipid Panel    Component Value Date/Time   CHOL 151 12/01/2023 1212   TRIG 214 (H) 12/01/2023 1212   HDL 50 12/01/2023 1212   CHOLHDL 3.0 12/01/2023 1212   CHOLHDL 2.3 10/10/2022 1510   VLDL 49 (H) 06/19/2022 1519   LDLCALC 66 12/01/2023 1212   LDLCALC 64 10/10/2022 1510    Physical Exam:    VS:  There were no vitals taken for this visit.    Wt Readings from Last 3 Encounters:  05/15/24 221 lb 9.6 oz (100.5 kg)  03/18/24 198 lb 3.2 oz (89.9 kg)  02/12/24 217 lb (98.4 kg)     GEN:  Well nourished, well developed in no acute distress HEENT: Normal NECK: No JVD; No carotid bruits CARDIAC:  RRR, 2/6 systolic murmur RESPIRATORY:  Clear to auscultation without rales, wheezing or rhonchi  ABDOMEN: Soft, non-tender, non-distended MUSCULOSKELETAL:  No edema; No deformity  SKIN: Warm and dry NEUROLOGIC:  Alert and oriented x 3 PSYCHIATRIC:  Normal affect   ASSESSMENT:    No diagnosis found.      PLAN:    Aortic stenosis s/p TAVR: Echocardiogram 10/2023 showed EF 60 to 65%, G2DD, normal RV function, moderate to severe aortic stenosis (mean gradient 32 mmHg, V-max 3.9 m/s, AVA 0.9 cm).  He is reporting dyspnea with minimal exertion, as well as chest pain and lightheadedness.  He was referred to valve clinic and underwent TAVR on 01/30/2024 with 29 mm  Medtronic valve.  Echocardiogram 03/18/2024 showed EF 50 to 55%, moderate LVH, normal RV function, normal functioning TAVR valve with mean gradient 6 mmHg.   CAD: status post CABG.  Coronary angiography 06/07/21: normal left main with severe mid-LAD disease and patent LIMA graft to LAD but with slow flow in the distal graft with possible filling defect vs streaming; in either case, the distal vessel is very small, too small for PCI. There were patent stents in second obtuse marginal branch. Distal circumflex was chronically occluded with right to left collaterals. Sequential SVG to two OM branches was chronically occluded. RCA contained a widely patent stent in proximal portion.  Echocardiogram 12/15/2021 showed EF 55 to 60%, low normal RV function, mild to moderate left atrial enlargement, small pericardial effusion, moderate aortic stenosis.  Lexiscan  Myoview  on 12/16/2021 showed no ischemia or infarction, EF 58%. LHC 11/17/2022 showed patent LIMA-LAD, occluded SVG-OM, patent mid to distal LCx stent, occluded distal LCx.   -Continue Eliquis , statin -Carvedilol  6.25 mg twice daily -Ranexa  500 mg twice daily -Imdur  30 mg daily   Chronic diastolic heart failure: Currently on lasix  40 mg every day.  Check BMET, magnesium ***   Atrial fibrillation: On Eliquis  5 mg twice daily and carvedilol  6.25 mg twice daily.  On Amio 200 mg daily.  In atrial paced rhythm.     Symptomatic bradycardia: Status post [employer] PPM.  Referred to Dr. Francyne for device follow-up -Needs to reestablish with Dr. Francyne for device management, will schedule   Hypertension: On carvedilol  6.25 mg twice daily and Imdur  30 mg daily.  Appears controlled   CKD stage IIIb: Baseline creatinine appears 1.5-1.8.  Creatinine 1.7 on 05/15/2024.  Check BMET   OSA: Severe OSA on sleep study 12/2021, needs BiPAP but was not started.  Referred to sleep medicine, planning to start Bipap***   Hyperlipidemia: On rosuvastatin  40 mg  daily.  LDL 64 on 10/10/2022.  Update lipid panel     RTC in 6 months***   Medication Adjustments/Labs and Tests Ordered: Current medicines are reviewed at length with the patient today.  Concerns regarding medicines are outlined above.  No orders of the defined types were placed in this encounter.  No orders of the defined types were placed in this encounter.   There are no Patient Instructions on file for this visit.   Signed, Lonni LITTIE Nanas, MD  07/28/2024 9:21 PM    [facility] "

## 2024-07-30 ENCOUNTER — Ambulatory Visit: Admitting: Cardiology

## 2024-07-31 ENCOUNTER — Ambulatory Visit

## 2024-07-31 DIAGNOSIS — I480 Paroxysmal atrial fibrillation: Secondary | ICD-10-CM | POA: Diagnosis not present

## 2024-08-01 LAB — CUP PACEART REMOTE DEVICE CHECK
Battery Remaining Longevity: 138 mo
Battery Remaining Percentage: 100 %
Brady Statistic RA Percent Paced: 88 %
Brady Statistic RV Percent Paced: 10 %
Date Time Interrogation Session: 20251231022800
Implantable Lead Connection Status: 753985
Implantable Lead Connection Status: 753985
Implantable Lead Implant Date: 20220915
Implantable Lead Implant Date: 20220915
Implantable Lead Location: 753859
Implantable Lead Location: 753860
Implantable Lead Model: 4469
Implantable Lead Model: 4470
Implantable Lead Serial Number: 624816
Implantable Lead Serial Number: 877900
Implantable Pulse Generator Implant Date: 20220915
Lead Channel Impedance Value: 403 Ohm
Lead Channel Impedance Value: 469 Ohm
Lead Channel Pacing Threshold Amplitude: 0.4 V
Lead Channel Pacing Threshold Amplitude: 0.7 V
Lead Channel Pacing Threshold Pulse Width: 0.4 ms
Lead Channel Pacing Threshold Pulse Width: 0.4 ms
Lead Channel Setting Pacing Amplitude: 2 V
Lead Channel Setting Pacing Amplitude: 2.5 V
Lead Channel Setting Pacing Pulse Width: 0.4 ms
Lead Channel Setting Sensing Sensitivity: 2.5 mV
Pulse Gen Serial Number: 542710
Zone Setting Status: 755011

## 2024-08-02 ENCOUNTER — Encounter: Admitting: Family

## 2024-08-03 ENCOUNTER — Ambulatory Visit: Payer: Self-pay | Admitting: Cardiovascular Disease

## 2024-08-05 NOTE — Progress Notes (Signed)
 This encounter was created in error - please disregard.

## 2024-08-05 NOTE — Progress Notes (Signed)
 Remote PPM Transmission

## 2024-08-06 NOTE — Progress Notes (Deleted)
 " Cardiology Office Note:    Date:  08/06/2024   ID:  Johnny [name], DOB [birth year], MRN [id number]  PCP:  Phyllis Jereld BROCKS, NP  Cardiologist:  Lonni LITTIE Nanas, MD  Electrophysiologist:  None   Referring MD: Phyllis Jereld BROCKS, NP   No chief complaint on file.   History of Present Illness:    Johnny [name] is a 79 y.o. male with a hx of CAD status post CABG in 2015, moderate aortic stenosis, diastolic heart failure, OSA, atrial fibrillation, symptomatic bradycardia status post PPM who presents for hospital follow-up.  He was admitted 11/2021 with chest pain.  He had rmoved from [place]  in 09/2021.  Echocardiogram 12/15/2021 showed EF 55 to 60%, low normal RV function, mild to moderate left atrial enlargement, small pericardial effusion, moderate aortic stenosis.  Lexiscan  Myoview  on 12/16/2021 showed no ischemia or infarction, EF 58%.  Echocardiogram 06/2022 showed EF 65 to 70%, normal RV function, moderate aortic stenosis (mean gradient 17 mmHg, V-max 2.9 m/s, VTI 1.2 cm, DI 0.37).  He was admitted 10/2022 with chest pain.  Echo 11/16/2022 showed EF 65 to 70%, grade 2 diastolic dysfunction, mild RV systolic dysfunction, moderate aortic stenosis.  LHC 11/17/2022 showed patent LIMA-LAD, occluded SVG-OM, patent mid to distal LCx stent, occluded distal LCx.  Echocardiogram 10/2023 showed EF 60 to 65%, G2DD, normal RV function, moderate to severe aortic stenosis (mean gradient 32 mmHg, V-max 3.9 m/s, AVA 0.9 cm).  He was referred to valve clinic and underwent TAVR on 01/30/2024 with 29 mm Medtronic valve.  Echocardiogram 03/18/2024 showed EF 50 to 55%, moderate LVH, normal RV function, normal functioning TAVR valve with mean gradient 6 mmHg.   Since last clinic visit, he reports he has been having issues with chest pain and dyspnea with exertion.  States that just walking from the waiting room to the office he feels short of breath.  Also gets chest pain and lightheadedness, but denies  any syncope.  He is taking Eliquis , denies any bleeding issues.  No recent falls.   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 Wonda and Dr. Lucas   Symptomatic bradycardia 04/2021   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[place], [place]    Past Surgical History:  Procedure Laterality Date   BACK SURGERY  1975   Lower Back Surgery, Dr.Seltzer   BACK SURGERY  1985   Dr.Seltzer   INTRAOPERATIVE TRANSTHORACIC ECHOCARDIOGRAM N/A 01/30/2024   Procedure: ECHOCARDIOGRAM, TRANSTHORACIC;  Surgeon: Wonda Sharper, MD;  Location[facility] INVASIVE CV LAB;  Service: Cardiovascular;  Laterality: N/A;   LEFT HEART CATH AND CORS/GRAFTS ANGIOGRAPHY N/A 11/17/2022   Procedure: LEFT HEART CATH AND CORS/GRAFTS ANGIOGRAPHY;  Surgeon: Dann Candyce RAMAN, MD;  Location: [facility] INVASIVE CV LAB;  Service: Cardiovascular;  Laterality: N/A;   RIGHT/LEFT HEART CATH AND CORONARY/GRAFT ANGIOGRAPHY N/A 12/21/2023   Procedure: RIGHT/LEFT HEART CATH AND CORONARY/GRAFT ANGIOGRAPHY;  Surgeon: Wendel Lurena POUR, MD;  Location: [facility] INVASIVE CV LAB;  Service: Cardiovascular;  Laterality: N/A;   SHOULDER SURGERY Left 1965   Dr.Seltzer   TONSILLECTOMY  1950  Dr.Perellie    Current Medications: No outpatient medications have been marked as taking for the 08/07/24 encounter (Appointment) with Kate Lonni CROME, MD.     Allergies:   Metformin and related   Social History   Socioeconomic History   Marital status: Widowed    Spouse name: Not on file   Number of children: Not on file   Years of education: Not on file   Highest education level: 12th grade  Occupational History   Not on file  Tobacco Use    Smoking status: Former    Current packs/day: 1.00    Average packs/day: 1 pack/day for 10.0 years (10.0 ttl pk-yrs)    Types: Cigarettes   Smokeless tobacco: Never   Tobacco comments:    Quit at age 26  Vaping Use   Vaping status: Never Used  Substance and Sexual Activity   Alcohol use: Yes    Alcohol/week: 1.0 standard drink of alcohol    Types: 1 Glasses of wine per week    Comment: occasion   Drug use: Never   Sexual activity: Not on file  Other Topics Concern   Not on file  Social History Narrative   Tobacco use, amount per day now: None   Past tobacco use, amount per day: 1 pack   How many years did you use tobacco: 10 years.   Alcohol use (drinks per week): None   Diet:   Do you drink/eat things with caffeine: Yes   Marital status:  Widow                                What year were you married? 1972   Do you live in a house, apartment, assisted living, condo, trailer, etc.? House   Is it one or more stories? One   How many persons live in your home? One   Do you have pets in your home?( please list) No   Highest Level of education completed? 12th grade.   Current or past profession: Psychologist, Occupational, Investment Banker, Operational, Clinical Biochemist.    Do you exercise?   No                               Type and how often?   Do you have a living will? Yes   Do you have a DNR form?     No                              If not, do you want to discuss one?   Do you have signed POA/HPOA forms?  Yes                      If so, please bring to you appointment      Do you have any difficulty bathing or dressing yourself? No   Do you have any difficulty preparing food or eating? No   Do you have any difficulty managing your medications? No   Do you have any difficulty managing your finances? Yes   Do you have any difficulty affording your medications? No   Social Drivers of Health   Tobacco Use: Medium Risk (05/15/2024)   Patient History    Smoking Tobacco Use: Former    Smokeless Tobacco Use: Never     Passive Exposure: Not on  file  Financial Resource Strain: High Risk (07/02/2023)   Overall Financial Resource Strain (CARDIA)    Difficulty of Paying Living Expenses: Hard  Food Insecurity: Food Insecurity Present (01/31/2024)   Epic    Worried About Programme Researcher, Broadcasting/film/video in the Last Year: Sometimes true    Ran Out of Food in the Last Year: Sometimes true  Transportation Needs: Unmet Transportation Needs (01/31/2024)   Epic    Lack of Transportation (Medical): Yes    Lack of Transportation (Non-Medical): No  Physical Activity: Unknown (07/02/2023)   Exercise Vital Sign    Days of Exercise per Week: 0 days    Minutes of Exercise per Session: Not on file  Stress: Stress Concern Present (07/02/2023)   [employer] of Occupational Health - Occupational Stress Questionnaire    Feeling of Stress : To some extent  Social Connections: Moderately Isolated (01/31/2024)   Social Connection and Isolation Panel    Frequency of Communication with Friends and Family: More than three times a week    Frequency of Social Gatherings with Friends and Family: Never    Attends Religious Services: More than 4 times per year    Active Member of [employer] or Organizations: No    Attends Banker Meetings: Not on file    Marital Status: Widowed  Depression (PHQ2-9): High Risk (11/30/2023)   Depression (PHQ2-9)    PHQ-2 Score: 17  Alcohol Screen: Low Risk (07/02/2023)   Alcohol Screen    Last Alcohol Screening Score (AUDIT): 2  Housing: Low Risk (01/31/2024)   Epic    Unable to Pay for Housing in the Last Year: No    Number of Times Moved in the Last Year: 0    Homeless in the Last Year: No  Utilities: Not At Risk (01/31/2024)   Epic    Threatened with loss of utilities: No  Health Literacy: Not on file     Family History: The patient's family history includes Cancer in his father; Stroke in his brother and sister.  ROS:   Please see the history of present illness.     All other systems reviewed and  are negative.  EKGs/Labs/Other Studies Reviewed:    The following studies were reviewed today:   EKG:   12/29/2021: Atrial paced rhythm, rate 60, diffuse less than 1 mm ST depressions 11/10/2022: Atrial paced rhythm, rate 63, nonspecific T wave flattening 02/14/2023: Atrial paced rhythm, rate 60, diffuse less than 1 mm ST depressions 12/01/23: Atrial paced rhythm, right bundle branch block, rate 60  Recent Labs: 08/29/2023: TSH 1.96 01/26/2024: ALT 20 01/31/2024: Magnesium  1.8 02/07/2024: Hemoglobin 15.4; Platelets 190 05/15/2024: BUN 21; Creatinine, Ser 1.74; Potassium 4.1; Pro B Natriuretic peptide (BNP) 145.0; Sodium 141  Recent Lipid Panel    Component Value Date/Time   CHOL 151 12/01/2023 1212   TRIG 214 (H) 12/01/2023 1212   HDL 50 12/01/2023 1212   CHOLHDL 3.0 12/01/2023 1212   CHOLHDL 2.3 10/10/2022 1510   VLDL 49 (H) 06/19/2022 1519   LDLCALC 66 12/01/2023 1212   LDLCALC 64 10/10/2022 1510    Physical Exam:    VS:  There were no vitals taken for this visit.    Wt Readings from Last 3 Encounters:  05/15/24 221 lb 9.6 oz (100.5 kg)  03/18/24 198 lb 3.2 oz (89.9 kg)  02/12/24 217 lb (98.4 kg)     GEN:  Well nourished, well developed in no acute distress HEENT: Normal NECK: No JVD; No carotid bruits CARDIAC:  RRR, 2/6 systolic murmur RESPIRATORY:  Clear to auscultation without rales, wheezing or rhonchi  ABDOMEN: Soft, non-tender, non-distended MUSCULOSKELETAL:  No edema; No deformity  SKIN: Warm and dry NEUROLOGIC:  Alert and oriented x 3 PSYCHIATRIC:  Normal affect   ASSESSMENT:    No diagnosis found.      PLAN:    Aortic stenosis s/p TAVR: Echocardiogram 10/2023 showed EF 60 to 65%, G2DD, normal RV function, moderate to severe aortic stenosis (mean gradient 32 mmHg, V-max 3.9 m/s, AVA 0.9 cm).  He is reporting dyspnea with minimal exertion, as well as chest pain and lightheadedness.  He was referred to valve clinic and underwent TAVR on 01/30/2024 with 29 mm  Medtronic valve.  Echocardiogram 03/18/2024 showed EF 50 to 55%, moderate LVH, normal RV function, normal functioning TAVR valve with mean gradient 6 mmHg.   CAD: status post CABG.  Coronary angiography 06/07/21: normal left main with severe mid-LAD disease and patent LIMA graft to LAD but with slow flow in the distal graft with possible filling defect vs streaming; in either case, the distal vessel is very small, too small for PCI. There were patent stents in second obtuse marginal branch. Distal circumflex was chronically occluded with right to left collaterals. Sequential SVG to two OM branches was chronically occluded. RCA contained a widely patent stent in proximal portion.  Echocardiogram 12/15/2021 showed EF 55 to 60%, low normal RV function, mild to moderate left atrial enlargement, small pericardial effusion, moderate aortic stenosis.  Lexiscan  Myoview  on 12/16/2021 showed no ischemia or infarction, EF 58%. LHC 11/17/2022 showed patent LIMA-LAD, occluded SVG-OM, patent mid to distal LCx stent, occluded distal LCx.   -Continue Eliquis , statin -Carvedilol  6.25 mg twice daily -Ranexa  500 mg twice daily -Imdur  30 mg daily   Chronic diastolic heart failure: Currently on lasix  40 mg every day.  Check BMET, magnesium ***   Atrial fibrillation: On Eliquis  5 mg twice daily and carvedilol  6.25 mg twice daily.  On Amio 200 mg daily.  In atrial paced rhythm.     Symptomatic bradycardia: Status post [employer] PPM.  Referred to Dr. Francyne for device follow-up -Needs to reestablish with Dr. Francyne for device management, will schedule   Hypertension: On carvedilol  6.25 mg twice daily and Imdur  30 mg daily.  Appears controlled   CKD stage IIIb: Baseline creatinine appears 1.5-1.8.  Creatinine 1.7 on 05/15/2024.  Check BMET   OSA: Severe OSA on sleep study 12/2021, needs BiPAP but was not started.  Referred to sleep medicine, planning to start Bipap***   Hyperlipidemia: On rosuvastatin  40 mg  daily.  LDL 64 on 10/10/2022.  Update lipid panel     RTC in 6 months***   Medication Adjustments/Labs and Tests Ordered: Current medicines are reviewed at length with the patient today.  Concerns regarding medicines are outlined above.  No orders of the defined types were placed in this encounter.  No orders of the defined types were placed in this encounter.   There are no Patient Instructions on file for this visit.   Signed, Lonni LITTIE Nanas, MD  08/06/2024 9:28 PM    [facility] "

## 2024-08-07 ENCOUNTER — Ambulatory Visit: Admitting: Cardiology

## 2024-08-09 ENCOUNTER — Encounter: Payer: Self-pay | Admitting: Adult Health

## 2024-08-09 ENCOUNTER — Other Ambulatory Visit: Payer: Self-pay

## 2024-08-09 ENCOUNTER — Ambulatory Visit (INDEPENDENT_AMBULATORY_CARE_PROVIDER_SITE_OTHER): Admitting: Adult Health

## 2024-08-09 VITALS — BP 126/68 | HR 61 | Temp 97.5°F | Ht 66.0 in | Wt 219.0 lb

## 2024-08-09 DIAGNOSIS — F339 Major depressive disorder, recurrent, unspecified: Secondary | ICD-10-CM | POA: Diagnosis not present

## 2024-08-09 DIAGNOSIS — I25118 Atherosclerotic heart disease of native coronary artery with other forms of angina pectoris: Secondary | ICD-10-CM | POA: Diagnosis not present

## 2024-08-09 DIAGNOSIS — Z113 Encounter for screening for infections with a predominantly sexual mode of transmission: Secondary | ICD-10-CM | POA: Diagnosis not present

## 2024-08-09 DIAGNOSIS — I5032 Chronic diastolic (congestive) heart failure: Secondary | ICD-10-CM

## 2024-08-09 DIAGNOSIS — R7303 Prediabetes: Secondary | ICD-10-CM

## 2024-08-09 DIAGNOSIS — K219 Gastro-esophageal reflux disease without esophagitis: Secondary | ICD-10-CM | POA: Diagnosis not present

## 2024-08-09 DIAGNOSIS — J301 Allergic rhinitis due to pollen: Secondary | ICD-10-CM | POA: Diagnosis not present

## 2024-08-09 DIAGNOSIS — G4733 Obstructive sleep apnea (adult) (pediatric): Secondary | ICD-10-CM | POA: Diagnosis not present

## 2024-08-09 DIAGNOSIS — I480 Paroxysmal atrial fibrillation: Secondary | ICD-10-CM | POA: Diagnosis not present

## 2024-08-09 DIAGNOSIS — E1142 Type 2 diabetes mellitus with diabetic polyneuropathy: Secondary | ICD-10-CM

## 2024-08-09 DIAGNOSIS — J9611 Chronic respiratory failure with hypoxia: Secondary | ICD-10-CM | POA: Diagnosis not present

## 2024-08-09 MED ORDER — BLOOD GLUCOSE METER KIT: PACK | 0 refills | Status: DC

## 2024-08-09 MED ORDER — LANCETS MISC: 0 refills | Status: AC

## 2024-08-09 MED ORDER — FLUTICASONE PROPIONATE 50 MCG/ACT NA SUSP: 2.0000 | Freq: Every day | NASAL | 3 refills | Status: AC

## 2024-08-09 MED ORDER — GLUCOSE BLOOD VI STRP: 1.0000 | ORAL_STRIP | Freq: Every day | 12 refills | Status: DC

## 2024-08-09 MED ORDER — BLOOD GLUCOSE MONITORING SUPPL DEVI: 1.0000 | Freq: Three times a day (TID) | 0 refills | Status: AC

## 2024-08-09 MED ORDER — BLOOD GLUCOSE TEST VI STRP: ORAL_STRIP | 1 refills | Status: DC

## 2024-08-09 MED ORDER — LANCET DEVICE MISC: 0 refills | Status: AC

## 2024-08-09 NOTE — Progress Notes (Unsigned)
 "  [facility] clinic  Provider:  Jereld Serum DNP  Code Status: ***  Goals of Care:     12/21/2023    6:24 AM  Advanced Directives  Does Patient Have a Medical Advance Directive? Yes  Type of Advance Directive Living will;Healthcare Power of Attorney  Does patient want to make changes to medical advance directive? No - Guardian declined  Copy of Healthcare Power of Attorney in Chart? No - copy requested     Chief Complaint  Patient presents with   Medical Management of Chronic Issues    Oxygen  levels, vertigo(ongoing since December)     HPI: Patient is a 79 y.o. male seen today for an acute visit for Discussed the use of AI scribe software for clinical note transcription with the patient, who gave verbal consent to proceed.  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 Wonda and Dr. Lucas   Symptomatic bradycardia 04/2021   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[place], [place]    Past Surgical History:  Procedure Laterality Date   BACK SURGERY  1975   Lower Back Surgery, Dr.Seltzer   BACK SURGERY  1985   Dr.Seltzer   INTRAOPERATIVE TRANSTHORACIC ECHOCARDIOGRAM N/A 01/30/2024   Procedure: ECHOCARDIOGRAM, TRANSTHORACIC;  Surgeon: Wonda Sharper, MD;  Location[facility] INVASIVE CV LAB;  Service: Cardiovascular;  Laterality: N/A;   LEFT HEART CATH AND CORS/GRAFTS ANGIOGRAPHY N/A 11/17/2022   Procedure: LEFT HEART CATH AND CORS/GRAFTS ANGIOGRAPHY;  Surgeon: Dann Candyce RAMAN, MD;  Location: [facility] INVASIVE CV LAB;  Service: Cardiovascular;  Laterality: N/A;   RIGHT/LEFT HEART CATH AND CORONARY/GRAFT ANGIOGRAPHY N/A 12/21/2023   Procedure: RIGHT/LEFT HEART CATH AND CORONARY/GRAFT ANGIOGRAPHY;  Surgeon: Wendel Lurena POUR, MD;  Location: [facility] INVASIVE CV LAB;  Service:  Cardiovascular;  Laterality: N/A;   SHOULDER SURGERY Left 1965   Dr.Seltzer   TONSILLECTOMY  1950   Dr.Perellie    Allergies[1]  Outpatient Encounter Medications as of 08/09/2024  Medication Sig   albuterol  (VENTOLIN  HFA) 108 (90 Base) MCG/ACT inhaler Inhale 2 puffs into the lungs every 6 (six) hours as needed for wheezing or shortness of breath.   amiodarone  (PACERONE ) 200 MG tablet TAKE 1 TABLET BY MOUTH DAILY   amoxicillin  (AMOXIL ) 500 MG tablet Take 4 tablets (2,000 mg total) by mouth as directed. 1 hour prior to dental work including cleanings   apixaban  (ELIQUIS ) 5 MG TABS tablet TAKE 1 TABLET BY MOUTH TWICE  DAILY   carvedilol  (COREG ) 6.25 MG tablet TAKE 1 TABLET BY MOUTH TWICE  DAILY   cyanocobalamin  (VITAMIN B12) 1000 MCG tablet Take 1 tablet (1,000 mcg total) by mouth daily.   famotidine  (PEPCID ) 40 MG tablet Take 40 mg by mouth daily.   fluticasone  (FLONASE ) 50 MCG/ACT nasal spray Place 2 sprays into both nostrils daily.   furosemide  (LASIX ) 40 MG tablet Take 1 tablet (40 mg total) by mouth daily.   glucose blood test strip 1 each by Other route daily. [employer] Glucometer   isosorbide  mononitrate (IMDUR ) 30 MG 24 hr tablet TAKE 1 TABLET BY MOUTH DAILY   loratadine  (CLARITIN ) 10 MG tablet Take 1 tablet (10 mg total) by mouth daily.   mirtazapine  (REMERON  SOL-TAB) 30 MG disintegrating tablet Take 1  tablet (30 mg total) by mouth at bedtime.   montelukast  (SINGULAIR ) 10 MG tablet Take 10 mg by mouth daily.   nitroGLYCERIN  (NITROSTAT ) 0.4 MG SL tablet DISSOLVE 1 TABLET UNDER THE  TONGUE EVERY 5 MINUTES AS NEEDED FOR CHEST PAIN. MAX OF 3 TABLETS IN 15 MINUTES. CALL 911 IF PAIN  PERSISTS. (Patient taking differently: as needed.)   pantoprazole  (PROTONIX ) 40 MG tablet TAKE 1 TABLET BY MOUTH DAILY  BEFORE BREAKFAST   ranolazine  (RANEXA ) 500 MG 12 hr tablet Take 1 tablet (500 mg total) by mouth 2 (two) times daily.   rosuvastatin  (CRESTOR ) 40 MG tablet TAKE 1 TABLET BY MOUTH DAILY    venlafaxine  XR (EFFEXOR -XR) 150 MG 24 hr capsule TAKE 1 CAPSULE BY MOUTH DAILY  WITH BREAKFAST   No facility-administered encounter medications on file as of 08/09/2024.    Review of Systems:  Review of Systems  Constitutional:  Negative for activity change, appetite change and fever.  HENT:  Negative for sore throat.   Eyes: Negative.   Respiratory:  Positive for shortness of breath.   Cardiovascular:  Negative for chest pain and leg swelling.  Gastrointestinal:  Negative for abdominal distention, diarrhea and vomiting.  Genitourinary:  Negative for dysuria, frequency and urgency.  Skin:  Negative for color change.  Neurological:  Negative for dizziness and headaches.  Psychiatric/Behavioral:  Negative for behavioral problems and sleep disturbance. The patient is not nervous/anxious.     Health Maintenance  Topic Date Due   Hepatitis C Screening  Never done   Zoster Vaccines- Shingrix (1 of 2) Never done   Medicare Annual Wellness (AWV)  12/01/2023   HEMOGLOBIN A1C  03/17/2024   Diabetic kidney evaluation - Urine ACR  04/18/2028 (Originally 05/16/1964)   Diabetic kidney evaluation - eGFR measurement  05/15/2025   OPHTHALMOLOGY EXAM  08/02/2025   DTaP/Tdap/Td (2 - Td or Tdap) 11/03/2029   Pneumococcal Vaccine: 50+ Years  Completed   Influenza Vaccine  Completed   Meningococcal B Vaccine  Aged Out   FOOT EXAM  Discontinued   COVID-19 Vaccine  Discontinued    Physical Exam: Vitals:   08/09/24 1028  BP: 126/68  Pulse: 61  Temp: (!) 97.5 F (36.4 C)  TempSrc: Temporal  SpO2: 94%  Weight: 219 lb (99.3 kg)  Height: 5' 6 (1.676 m)   Body mass index is 35.35 kg/m. Physical Exam Constitutional:      Appearance: Normal appearance. He is obese.  HENT:     Head: Normocephalic and atraumatic.     Mouth/Throat:     Mouth: Mucous membranes are moist.  Eyes:     Conjunctiva/sclera: Conjunctivae normal.  Cardiovascular:     Rate and Rhythm: Normal rate and regular rhythm.      Pulses: Normal pulses.     Heart sounds: Normal heart sounds.  Pulmonary:     Effort: Pulmonary effort is normal.     Breath sounds: Normal breath sounds.  Abdominal:     General: Bowel sounds are normal.     Palpations: Abdomen is soft.  Musculoskeletal:        General: No swelling. Normal range of motion.     Cervical back: Normal range of motion.  Skin:    General: Skin is warm and dry.  Neurological:     General: No focal deficit present.     Mental Status: He is alert and oriented to person, place, and time.  Psychiatric:        Mood and Affect: Mood  normal.        Behavior: Behavior normal.        Thought Content: Thought content normal.        Judgment: Judgment normal.     Labs reviewed: Basic Metabolic Panel: Recent Labs    08/29/23 0857 12/01/23 1212 12/21/23 1042 01/31/24 0300 02/01/24 0306 02/06/24 0832 02/07/24 0748 05/15/24 1016  NA 142 142   < > 139   < > 138 140 141  K 4.0 4.3   < > 4.7   < > 3.8 4.5 4.1  CL 98 105   < > 103   < > 103 102 100  CO2 31 20   < > 29   < > 26 29 31   GLUCOSE 108* 87   < > 78   < > 164* 102* 103*  BUN 29* 29*   < > 19   < > 26* 21 21  CREATININE 2.10* 1.95*   < > 1.39*   < > 1.80* 1.80* 1.74*  CALCIUM  11.6*  11.6* 10.1   < > 9.5   < > 10.4* 10.8* 10.3  MG  --  1.7  --  1.8  --   --   --   --   TSH 1.96  --   --   --   --   --   --   --    < > = values in this interval not displayed.   Liver Function Tests: Recent Labs    01/26/24 0900  AST 22  ALT 20  ALKPHOS 67  BILITOT 0.6  PROT 7.0  ALBUMIN 3.9   No results for input(s): LIPASE, AMYLASE in the last 8760 hours. No results for input(s): AMMONIA in the last 8760 hours. CBC: Recent Labs    08/29/23 0857 12/01/23 1212 12/21/23 1042 02/03/24 0305 02/04/24 0357 02/07/24 0328  WBC 9.7 8.3   < > 9.0 7.9 8.5  NEUTROABS 6,829 5.9  --   --   --   --   HGB 15.9 13.6   < > 14.8 15.1 15.4  HCT 49.5 41.7   < > 46.5 46.6 47.9  MCV 89.2 88   < > 89.1 90.0  88.1  PLT 195 182   < > 157 162 190   < > = values in this interval not displayed.   Lipid Panel: Recent Labs    12/01/23 1212  CHOL 151  HDL 50  LDLCALC 66  TRIG 214*  CHOLHDL 3.0   Lab Results  Component Value Date   HGBA1C 6.4 (H) 09/18/2023    Procedures since last visit: CUP PACEART REMOTE DEVICE CHECK Result Date: 08/01/2024 Pacemaker: Scheduled remote reviewed. Normal device function.  Presenting rhythm: AP/VS Next remote transmission per protocol. ML, CVRS   Assessment/Plan   Labs/tests ordered:    Lab Results  Component Value Date   HGBA1C 6.4 (H) 09/18/2023        No follow-ups on file.  Jaionna Weisse Medina-Vargas, NP     [1]  Allergies Allergen Reactions   Metformin And Related Other (See Comments)    Metformin caused the patient's legs to become weak   "

## 2024-08-09 NOTE — Patient Instructions (Addendum)
" °  Schedule your medicare annual wellness visit. 2.   Needs Zoster vaccine. "

## 2024-08-10 LAB — COMPREHENSIVE METABOLIC PANEL WITH GFR
AG Ratio: 1.6 (calc) (ref 1.0–2.5)
ALT: 16 U/L (ref 9–46)
AST: 17 U/L (ref 10–35)
Albumin: 4.1 g/dL (ref 3.6–5.1)
Alkaline phosphatase (APISO): 78 U/L (ref 35–144)
BUN/Creatinine Ratio: 14 (calc) (ref 6–22)
BUN: 23 mg/dL (ref 7–25)
CO2: 29 mmol/L (ref 20–32)
Calcium: 10.1 mg/dL (ref 8.6–10.3)
Chloride: 103 mmol/L (ref 98–110)
Creat: 1.68 mg/dL — ABNORMAL HIGH (ref 0.70–1.28)
Globulin: 2.5 g/dL (ref 1.9–3.7)
Glucose, Bld: 143 mg/dL — ABNORMAL HIGH (ref 65–139)
Potassium: 4.3 mmol/L (ref 3.5–5.3)
Sodium: 140 mmol/L (ref 135–146)
Total Bilirubin: 0.6 mg/dL (ref 0.2–1.2)
Total Protein: 6.6 g/dL (ref 6.1–8.1)
eGFR: 41 mL/min/1.73m2 — ABNORMAL LOW

## 2024-08-10 LAB — HEPATITIS C ANTIBODY: Hepatitis C Ab: NONREACTIVE

## 2024-08-13 ENCOUNTER — Telehealth: Payer: Self-pay

## 2024-08-13 ENCOUNTER — Other Ambulatory Visit: Payer: Self-pay | Admitting: Adult Health

## 2024-08-13 ENCOUNTER — Ambulatory Visit: Payer: Self-pay | Admitting: Adult Health

## 2024-08-13 DIAGNOSIS — R7303 Prediabetes: Secondary | ICD-10-CM

## 2024-08-13 MED ORDER — BLOOD GLUCOSE METER KIT: PACK | 0 refills | Status: AC

## 2024-08-13 NOTE — Progress Notes (Signed)
-    hep C antibody negative -   GFR 41, ranging as chronic kidney diseases stage 3B, stable -  electrolytes and liver enzymes, all normal -  continue current medications

## 2024-08-13 NOTE — Telephone Encounter (Signed)
 Called patient and left message to see what type of glucometer that he uses, is it accu check, contour next or contour please. If patient calls back please ask.

## 2024-08-14 ENCOUNTER — Ambulatory Visit: Admitting: Cardiology

## 2024-08-14 NOTE — Telephone Encounter (Signed)
 Received call back from patient he was not sure what glucometer that his insurance covered. I was able to speak with [employer] and his insurance covers the accu-chek. Order has been placed per Hargis with Accu-chek

## 2024-08-19 ENCOUNTER — Other Ambulatory Visit: Payer: Self-pay

## 2024-08-19 DIAGNOSIS — E1142 Type 2 diabetes mellitus with diabetic polyneuropathy: Secondary | ICD-10-CM

## 2024-08-19 MED ORDER — GLUCOSE BLOOD VI STRP: 1.0000 | ORAL_STRIP | Freq: Every day | 12 refills | Status: AC

## 2024-08-19 MED ORDER — CONTOUR NEXT MONITOR W/DEVICE KIT: 1.0000 | PACK | Freq: Every day | 0 refills | Status: AC

## 2024-08-19 NOTE — Telephone Encounter (Signed)
" ° °  Supplies re-submitted for countor next  "

## 2024-08-26 ENCOUNTER — Ambulatory Visit: Admitting: Endocrinology

## 2024-08-26 ENCOUNTER — Ambulatory Visit: Payer: Self-pay | Admitting: Cardiology

## 2024-09-01 ENCOUNTER — Other Ambulatory Visit: Payer: Self-pay | Admitting: Adult Health

## 2024-09-10 ENCOUNTER — Encounter

## 2024-10-30 ENCOUNTER — Encounter

## 2024-11-07 ENCOUNTER — Ambulatory Visit: Admitting: Adult Health

## 2024-12-04 ENCOUNTER — Ambulatory Visit: Admitting: Endocrinology

## 2024-12-10 ENCOUNTER — Encounter

## 2024-12-12 ENCOUNTER — Ambulatory Visit: Admitting: Cardiology

## 2025-01-23 ENCOUNTER — Ambulatory Visit: Admitting: Physician Assistant

## 2025-01-23 ENCOUNTER — Other Ambulatory Visit (HOSPITAL_COMMUNITY)
